# Patient Record
Sex: Male | Born: 1975 | Race: Black or African American | Hispanic: No | Marital: Single | Smoking: Former smoker
Health system: Southern US, Community
[De-identification: ages and names within clinical notes are randomized; demographics above are authoritative.]

## PROBLEM LIST (undated history)

## (undated) DIAGNOSIS — I639 Cerebral infarction, unspecified: Secondary | ICD-10-CM

## (undated) DIAGNOSIS — I10 Essential (primary) hypertension: Secondary | ICD-10-CM

## (undated) DIAGNOSIS — I517 Cardiomegaly: Secondary | ICD-10-CM

## (undated) DIAGNOSIS — H409 Unspecified glaucoma: Secondary | ICD-10-CM

## (undated) DIAGNOSIS — D649 Anemia, unspecified: Secondary | ICD-10-CM

## (undated) DIAGNOSIS — R161 Splenomegaly, not elsewhere classified: Secondary | ICD-10-CM

## (undated) DIAGNOSIS — D696 Thrombocytopenia, unspecified: Secondary | ICD-10-CM

## (undated) DIAGNOSIS — R079 Chest pain, unspecified: Secondary | ICD-10-CM

## (undated) HISTORY — PX: CHOLECYSTECTOMY: SHX55

---

## 2017-04-18 IMAGING — US US ABDOMEN COMPLETE
1 series · 14 of 25 positions shown · non-contrast
Comparison: CT on [DATE]

CLINICAL DATA: Right upper quadrant abdominal pain for 2 days.
Prior cholecystectomy.

EXAM:
ABDOMEN ULTRASOUND COMPLETE

[Series 1: us abdomen complete · 0.25mm/px · 14 of 78 slices shown]
[im 1/78]
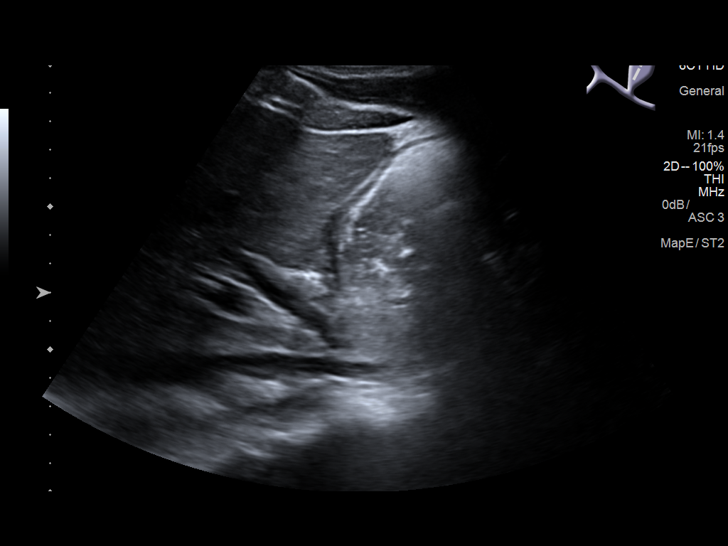
[im 7/78]
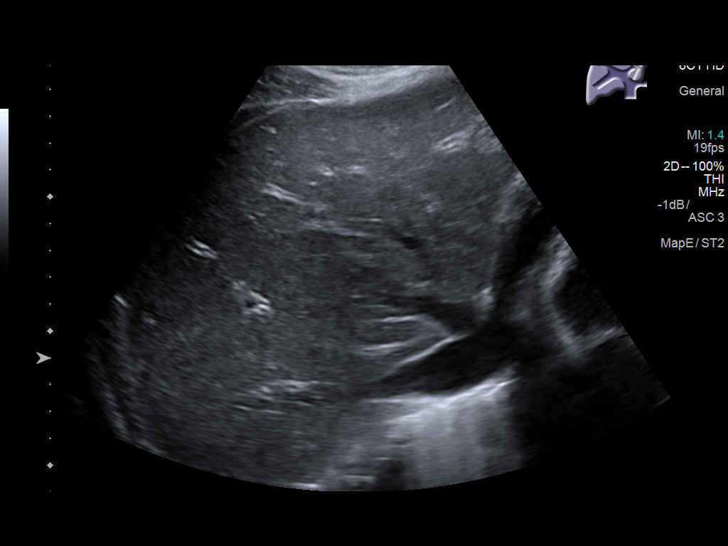
[im 13/78]
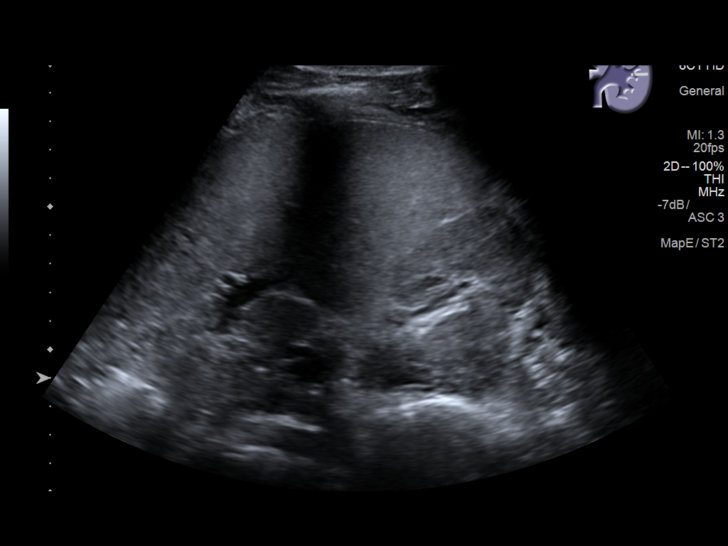
[im 20/78]
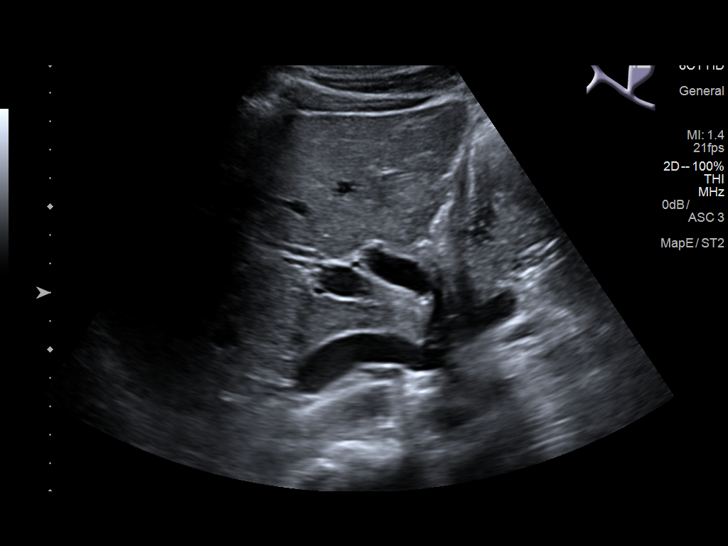
[im 26/78]
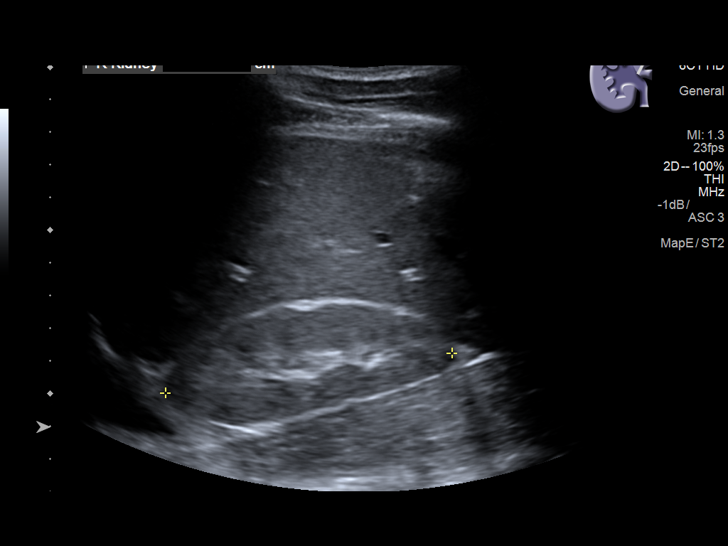
[im 29/78]
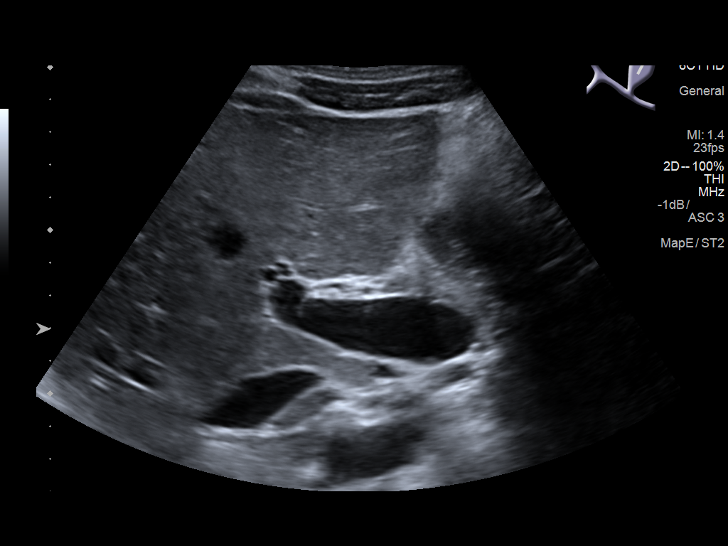
[im 36/78]
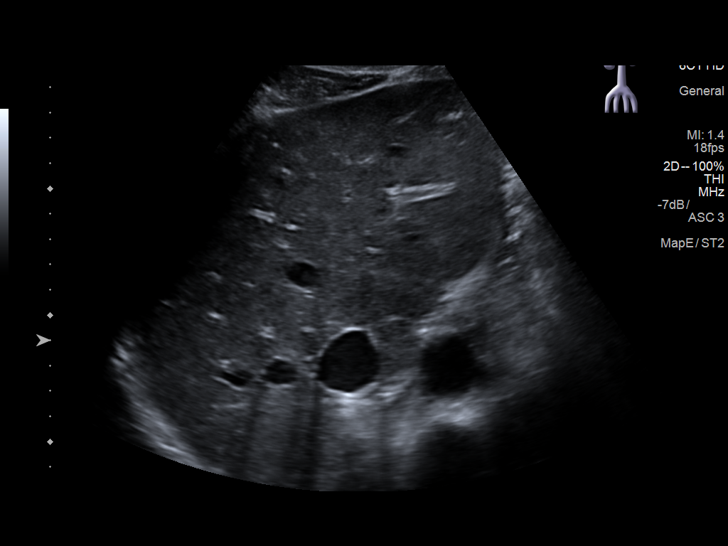
[im 42/78]
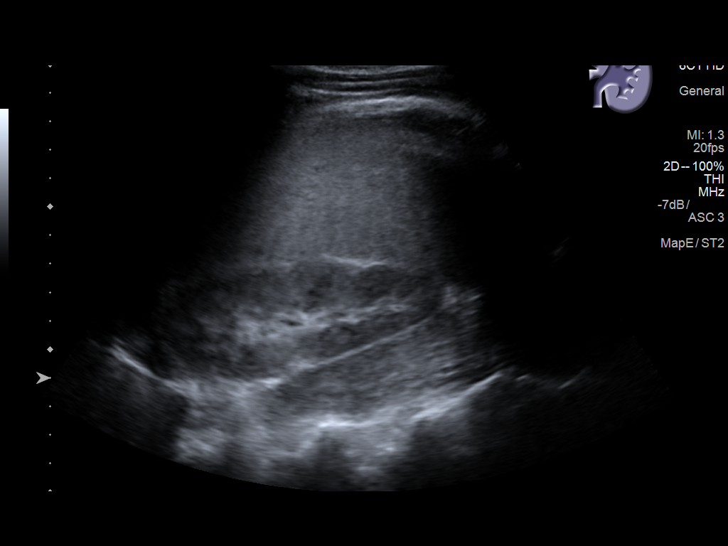
[im 49/78]
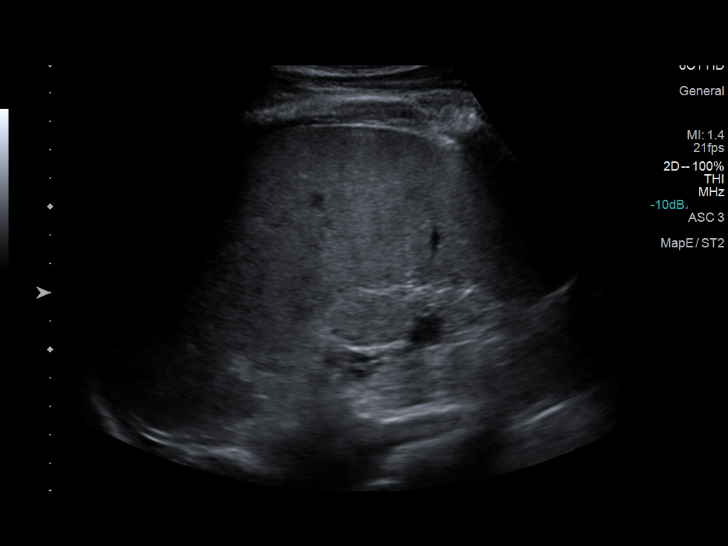
[im 52/78]
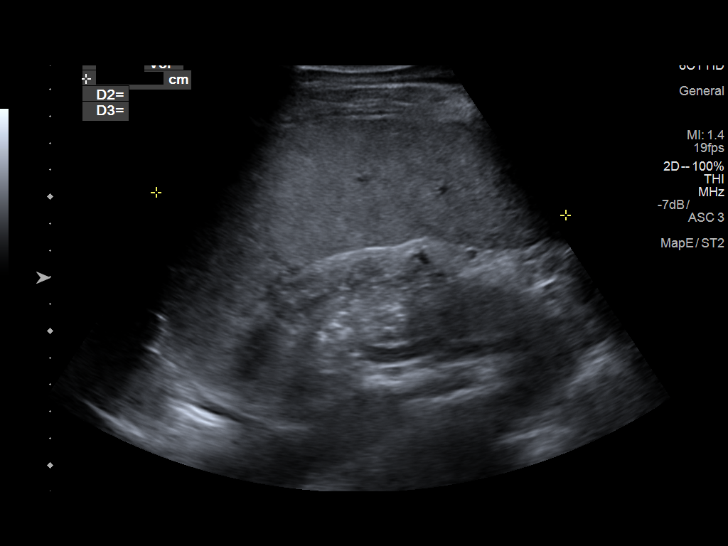
[im 58/78]
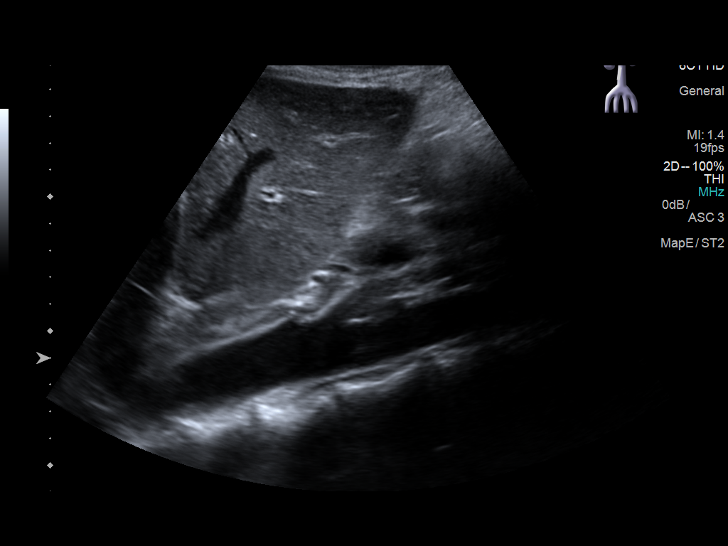
[im 65/78]
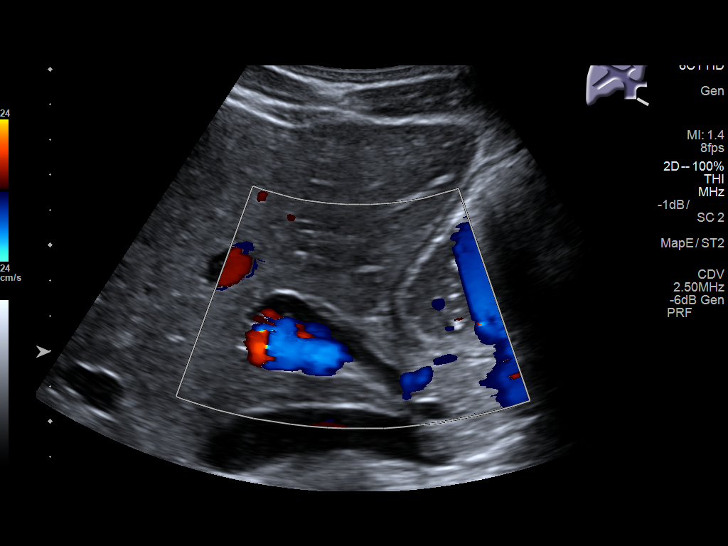
[im 71/78]
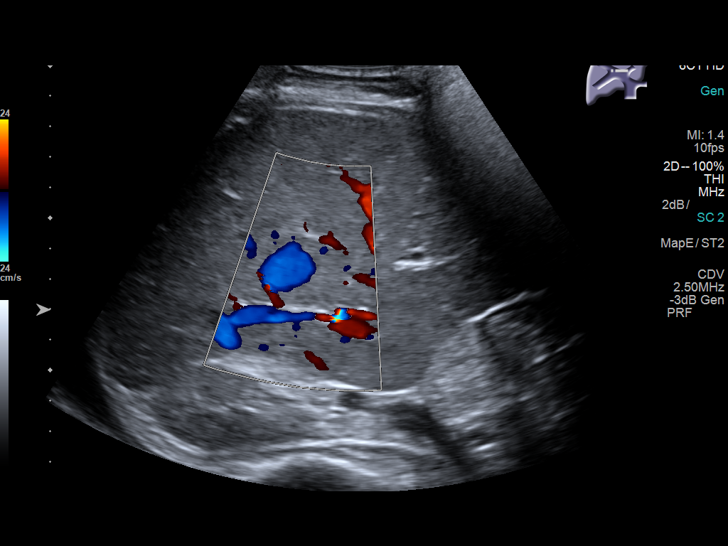
[im 78/78]
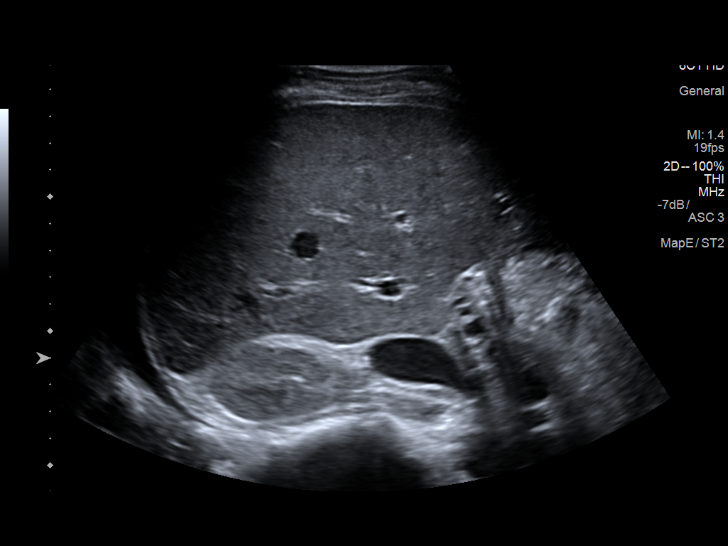

[14 of 25 positions shown; findings below may reference images not displayed]

FINDINGS: Gallbladder: Surgically absent.

Common bile duct: Diameter: 9 mm, within normal limits status post
cholecystectomy.

Liver: No focal lesion identified. Within normal limits in
parenchymal echogenicity. Portal vein is patent on color Doppler
imaging with normal direction of blood flow towards the liver.

IVC: No abnormality visualized.

Pancreas: Visualized portion unremarkable.

Spleen: Splenomegaly, with length measuring approximately 15 cm and
calculated volume of [SO] cc. No splenic masses identified.

Right Kidney: Length: 8.9 cm. Echogenicity within normal limits. No
mass or hydronephrosis visualized.

Left Kidney: Length: 10.2 cm. Echogenicity within normal limits. No
mass or hydronephrosis visualized.

Abdominal aorta: No aneurysm visualized.

Other findings: Small right pleural effusion.
IMPRESSION: Prior cholecystectomy.  No evidence of biliary ductal dilatation.

Moderate splenomegaly.

Small right pleural effusion.

## 2017-12-20 NOTE — Plan of Care (Signed)
[facility] for need of cardiology services Mr. Dayton ScrapeMurray is a 42 year old male with pmh HTN, who presents with complaints of chest pain and shortness of breath which she noted radiated to his back over the last few days.  No history of tobacco abuse or other known medical problems reported.  Vital signs patient was noted to be afebrile, pulse 85, respirations 22, blood pressure 177/84, and O2 saturations 90-100% on room air.  Labs revealed WBC 9.9, hemoglobin 9.3, platelets 30, sodium 136, potassium 3.7, BUN 13, creatinine 1.4, troponin 0 0.02, pro-BNP 4020, PT 10.4, and aPTT 29. Peripheral smear showed large platelets and reported schistocytes.  Stool guaiac was negative for any signs of blood.  CT angiogram was performed which showed no signs of a pulmonary embolus or dissection.  Given nitroglycerin which helped relieve pain symptoms and Lasix to be given.  Discussed with Elvia CollumBethany Martin with cardiology.  Accepted to a tele[facility] as [facility].

## 2017-12-21 ENCOUNTER — Inpatient Hospital Stay (HOSPITAL_COMMUNITY)
Admission: AD | Admit: 2017-12-21 | Discharge: 2017-12-25 | DRG: 808 | Disposition: A | Discharge status: Home / Self Care | Payer: Medicaid - Out of State | Source: Other Acute Inpatient Hospital | Attending: Family Medicine | Admitting: Family Medicine

## 2017-12-21 ENCOUNTER — Inpatient Hospital Stay (HOSPITAL_COMMUNITY): Payer: Medicaid - Out of State

## 2017-12-21 ENCOUNTER — Encounter (HOSPITAL_COMMUNITY): Payer: Self-pay | Admitting: Internal Medicine

## 2017-12-21 ENCOUNTER — Other Ambulatory Visit: Payer: Self-pay

## 2017-12-21 DIAGNOSIS — I5031 Acute diastolic (congestive) heart failure: Secondary | ICD-10-CM | POA: Diagnosis present

## 2017-12-21 DIAGNOSIS — I110 Hypertensive heart disease with heart failure: Secondary | ICD-10-CM | POA: Diagnosis present

## 2017-12-21 DIAGNOSIS — R1012 Left upper quadrant pain: Secondary | ICD-10-CM | POA: Diagnosis present

## 2017-12-21 DIAGNOSIS — M311 Thrombotic microangiopathy: Secondary | ICD-10-CM | POA: Diagnosis present

## 2017-12-21 DIAGNOSIS — E538 Deficiency of other specified B group vitamins: Secondary | ICD-10-CM

## 2017-12-21 DIAGNOSIS — I2699 Other pulmonary embolism without acute cor pulmonale: Secondary | ICD-10-CM | POA: Diagnosis present

## 2017-12-21 DIAGNOSIS — R161 Splenomegaly, not elsewhere classified: Secondary | ICD-10-CM | POA: Diagnosis present

## 2017-12-21 DIAGNOSIS — H409 Unspecified glaucoma: Secondary | ICD-10-CM | POA: Diagnosis present

## 2017-12-21 DIAGNOSIS — R718 Other abnormality of red blood cells: Secondary | ICD-10-CM | POA: Diagnosis not present

## 2017-12-21 DIAGNOSIS — D573 Sickle-cell trait: Secondary | ICD-10-CM | POA: Diagnosis present

## 2017-12-21 DIAGNOSIS — F1290 Cannabis use, unspecified, uncomplicated: Secondary | ICD-10-CM | POA: Diagnosis present

## 2017-12-21 DIAGNOSIS — M3119 Other thrombotic microangiopathy: Secondary | ICD-10-CM

## 2017-12-21 DIAGNOSIS — I2721 Secondary pulmonary arterial hypertension: Secondary | ICD-10-CM

## 2017-12-21 DIAGNOSIS — R0781 Pleurodynia: Secondary | ICD-10-CM

## 2017-12-21 DIAGNOSIS — R079 Chest pain, unspecified: Secondary | ICD-10-CM

## 2017-12-21 DIAGNOSIS — Z720 Tobacco use: Secondary | ICD-10-CM | POA: Diagnosis not present

## 2017-12-21 DIAGNOSIS — D599 Acquired hemolytic anemia, unspecified: Secondary | ICD-10-CM | POA: Diagnosis present

## 2017-12-21 DIAGNOSIS — I10 Essential (primary) hypertension: Secondary | ICD-10-CM | POA: Diagnosis not present

## 2017-12-21 DIAGNOSIS — J189 Pneumonia, unspecified organism: Secondary | ICD-10-CM

## 2017-12-21 DIAGNOSIS — Z9049 Acquired absence of other specified parts of digestive tract: Secondary | ICD-10-CM | POA: Diagnosis not present

## 2017-12-21 DIAGNOSIS — Z832 Family history of diseases of the blood and blood-forming organs and certain disorders involving the immune mechanism: Secondary | ICD-10-CM

## 2017-12-21 DIAGNOSIS — I272 Pulmonary hypertension, unspecified: Secondary | ICD-10-CM

## 2017-12-21 DIAGNOSIS — D649 Anemia, unspecified: Secondary | ICD-10-CM | POA: Diagnosis present

## 2017-12-21 DIAGNOSIS — I352 Nonrheumatic aortic (valve) stenosis with insufficiency: Secondary | ICD-10-CM

## 2017-12-21 DIAGNOSIS — I361 Nonrheumatic tricuspid (valve) insufficiency: Secondary | ICD-10-CM

## 2017-12-21 DIAGNOSIS — K068 Other specified disorders of gingiva and edentulous alveolar ridge: Secondary | ICD-10-CM | POA: Diagnosis present

## 2017-12-21 DIAGNOSIS — R0789 Other chest pain: Secondary | ICD-10-CM | POA: Diagnosis not present

## 2017-12-21 DIAGNOSIS — D696 Thrombocytopenia, unspecified: Secondary | ICD-10-CM | POA: Diagnosis not present

## 2017-12-21 DIAGNOSIS — R1011 Right upper quadrant pain: Secondary | ICD-10-CM

## 2017-12-21 HISTORY — DX: Unspecified glaucoma: H40.9

## 2017-12-21 HISTORY — DX: Splenomegaly, not elsewhere classified: R16.1

## 2017-12-21 HISTORY — DX: Essential (primary) hypertension: I10

## 2017-12-21 HISTORY — DX: Anemia, unspecified: D64.9

## 2017-12-21 HISTORY — DX: Thrombocytopenia, unspecified: D69.6

## 2017-12-21 HISTORY — DX: Chest pain, unspecified: R07.9

## 2017-12-21 HISTORY — DX: Cardiomegaly: I51.7

## 2017-12-21 LAB — LACTATE DEHYDROGENASE: LDH: 351 U/L — ABNORMAL HIGH (ref 98–192)

## 2017-12-21 LAB — ECHOCARDIOGRAM COMPLETE
E decel time: 201 msec
EERAT: 0.9
FS: 32 % (ref 28–44)
HEIGHTINCHES: 74 in
IVS/LV PW RATIO, ED: 0.94
LA ID, A-P, ES: 43 mm
LA diam end sys: 43 mm
LA diam index: 2.19 cm/m2
LA vol index: 53.6 mL/m2
LA vol: 105 mL
LAVOLA4C: 73.3 mL
LDCA: 3.46 cm2
LV E/e'average: 0.9
LV PW d: 13.6 mm — AB (ref 0.6–1.1)
LV TDI E'LATERAL: 8.7
LV TDI E'MEDIAL: 7.51
LVEEMED: 0.9
LVELAT: 8.7 cm/s
LVOT VTI: 22.5 cm
LVOT diameter: 21 mm
LVOT peak grad rest: 5 mmHg
LVOT peak vel: 114 cm/s
LVOTSV: 78 mL
MV Dec: 201
MV pk A vel: 98.8 m/s
MV pk E vel: 7.83 m/s
RV LATERAL S' VELOCITY: 14.4 cm/s
RV TAPSE: 21.3 mm
RV sys press: 63 mmHg
Reg peak vel: 388 cm/s
TR max vel: 388 cm/s
WEIGHTICAEL: 2511.48 [oz_av]

## 2017-12-21 LAB — URINALYSIS, ROUTINE W REFLEX MICROSCOPIC
Glucose, UA: NEGATIVE mg/dL
Ketones, ur: NEGATIVE mg/dL
LEUKOCYTES UA: NEGATIVE
Nitrite: NEGATIVE
PROTEIN: NEGATIVE mg/dL
Specific Gravity, Urine: 1.01 (ref 1.005–1.030)
pH: 7 (ref 5.0–8.0)

## 2017-12-21 LAB — URINALYSIS, MICROSCOPIC (REFLEX)
BACTERIA UA: NONE SEEN
WBC UA: NONE SEEN WBC/hpf (ref 0–5)

## 2017-12-21 LAB — COMPREHENSIVE METABOLIC PANEL
ALK PHOS: 84 U/L (ref 38–126)
ALT: 47 U/L (ref 17–63)
AST: 59 U/L — ABNORMAL HIGH (ref 15–41)
Albumin: 3.8 g/dL (ref 3.5–5.0)
Anion gap: 10 (ref 5–15)
BUN: 13 mg/dL (ref 6–20)
CALCIUM: 8.9 mg/dL (ref 8.9–10.3)
CO2: 25 mmol/L (ref 22–32)
Chloride: 100 mmol/L — ABNORMAL LOW (ref 101–111)
Creatinine, Ser: 1.55 mg/dL — ABNORMAL HIGH (ref 0.61–1.24)
GFR calc Af Amer: >60 mL/min (ref >60)
GFR calc non Af Amer: 53 mL/min — ABNORMAL LOW (ref >60)
GLUCOSE: 95 mg/dL (ref 65–99)
Potassium: 3.8 mmol/L (ref 3.5–5.1)
SODIUM: 135 mmol/L (ref 135–145)
Total Bilirubin: 6 mg/dL — ABNORMAL HIGH (ref 0.3–1.2)
Total Protein: 5.9 g/dL — ABNORMAL LOW (ref 6.5–8.1)

## 2017-12-21 LAB — IRON AND TIBC
IRON: 72 ug/dL (ref 45–182)
SATURATION RATIOS: 34 % (ref 17.9–39.5)
TIBC: 213 ug/dL — AB (ref 250–450)
UIBC: 141 ug/dL

## 2017-12-21 LAB — HEPARIN LEVEL (UNFRACTIONATED)
HEPARIN UNFRACTIONATED: 0.16 [IU]/mL — AB (ref 0.30–0.70)
Heparin Unfractionated: 0.2 IU/mL — ABNORMAL LOW (ref 0.30–0.70)

## 2017-12-21 LAB — CBC
HCT: 22.4 % — ABNORMAL LOW (ref 39.0–52.0)
Hemoglobin: 7.9 g/dL — ABNORMAL LOW (ref 13.0–17.0)
MCH: 30 pg (ref 26.0–34.0)
MCHC: 35.3 g/dL (ref 30.0–36.0)
MCV: 85.2 fL (ref 78.0–100.0)
PLATELETS: 50 10*3/uL — AB (ref 150–400)
RBC: 2.63 MIL/uL — AB (ref 4.22–5.81)
RDW: 24.2 % — ABNORMAL HIGH (ref 11.5–15.5)
WBC: 7.9 10*3/uL (ref 4.0–10.5)

## 2017-12-21 LAB — RETICULOCYTES
RBC.: 0.57 MIL/uL — ABNORMAL LOW (ref 4.22–5.81)
Retic Ct Pct: >23 % — ABNORMAL HIGH (ref 0.4–3.1)

## 2017-12-21 LAB — C-REACTIVE PROTEIN: CRP: 1.8 mg/dL — ABNORMAL HIGH (ref <1.0)

## 2017-12-21 LAB — BILIRUBIN, DIRECT: BILIRUBIN DIRECT: 0.8 mg/dL — AB (ref 0.1–0.5)

## 2017-12-21 LAB — HIV ANTIBODY (ROUTINE TESTING W REFLEX): HIV SCREEN 4TH GENERATION: NONREACTIVE

## 2017-12-21 LAB — VITAMIN B12: Vitamin B-12: 184 pg/mL (ref 180–914)

## 2017-12-21 LAB — RAPID URINE DRUG SCREEN, HOSP PERFORMED
AMPHETAMINES: NOT DETECTED
Barbiturates: NOT DETECTED
Benzodiazepines: NOT DETECTED
Cocaine: NOT DETECTED
OPIATES: POSITIVE — AB
Tetrahydrocannabinol: POSITIVE — AB

## 2017-12-21 LAB — SAVE SMEAR

## 2017-12-21 LAB — FERRITIN: FERRITIN: 209 ng/mL (ref 24–336)

## 2017-12-21 LAB — DIRECT ANTIGLOBULIN TEST (NOT AT ARMC)
DAT, IgG: NEGATIVE
DAT, complement: NEGATIVE

## 2017-12-21 LAB — SEDIMENTATION RATE: Sed Rate: 10 mm/hr (ref 0–16)

## 2017-12-21 LAB — BRAIN NATRIURETIC PEPTIDE: B Natriuretic Peptide: 819.1 pg/mL — ABNORMAL HIGH (ref 0.0–100.0)

## 2017-12-21 MED ORDER — ACETAMINOPHEN 325 MG PO TABS
650.0000 mg | ORAL_TABLET | ORAL | Status: DC | PRN
  Administered 2017-12-21: 650 mg via ORAL
  Filled 2017-12-21 (×2): qty 2

## 2017-12-21 MED ORDER — HYDROMORPHONE HCL 1 MG/ML IJ SOLN
1.0000 mg | Freq: Once | INTRAMUSCULAR | Status: AC
  Administered 2017-12-21: 1 mg via INTRAVENOUS
  Filled 2017-12-21: qty 1

## 2017-12-21 MED ORDER — SODIUM CHLORIDE 0.9 % IV SOLN
INTRAVENOUS | Status: AC
  Administered 2017-12-21 – 2017-12-22 (×3): via INTRAVENOUS

## 2017-12-21 MED ORDER — METOPROLOL TARTRATE 12.5 MG HALF TABLET
12.5000 mg | ORAL_TABLET | Freq: Two times a day (BID) | ORAL | Status: DC
  Administered 2017-12-21 – 2017-12-22 (×3): 12.5 mg via ORAL
  Filled 2017-12-21 (×3): qty 1

## 2017-12-21 MED ORDER — ONDANSETRON HCL 4 MG/2ML IJ SOLN
4.0000 mg | Freq: Four times a day (QID) | INTRAMUSCULAR | Status: DC | PRN
  Administered 2017-12-21 – 2017-12-25 (×10): 4 mg via INTRAVENOUS
  Filled 2017-12-21 (×10): qty 2

## 2017-12-21 MED ORDER — MORPHINE SULFATE (PF) 2 MG/ML IV SOLN: 2.0000 mg | INTRAVENOUS | Status: DC | PRN

## 2017-12-21 MED ORDER — HYDROMORPHONE HCL 1 MG/ML IJ SOLN
1.0000 mg | INTRAMUSCULAR | Status: DC | PRN
Start: 2017-12-21 — End: 2017-12-21
  Filled 2017-12-21: qty 1

## 2017-12-21 MED ORDER — HEPARIN (PORCINE) IN NACL 100-0.45 UNIT/ML-% IJ SOLN
1800.0000 [IU]/h | INTRAMUSCULAR | Status: DC
Start: 2017-12-21 — End: 2017-12-22
  Administered 2017-12-21: 1400 [IU]/h via INTRAVENOUS
  Filled 2017-12-21: qty 250

## 2017-12-21 MED ORDER — HYDROCODONE-ACETAMINOPHEN 5-325 MG PO TABS
1.0000 | ORAL_TABLET | ORAL | Status: DC | PRN
  Administered 2017-12-21 – 2017-12-22 (×3): 1 via ORAL
  Filled 2017-12-21 (×4): qty 1

## 2017-12-21 MED ORDER — ENOXAPARIN SODIUM 40 MG/0.4ML ~~LOC~~ SOLN: 40.0000 mg | Freq: Every day | SUBCUTANEOUS | Status: DC

## 2017-12-21 MED ORDER — GI COCKTAIL ~~LOC~~
30.0000 mL | Freq: Four times a day (QID) | ORAL | Status: DC | PRN
  Administered 2017-12-21 – 2017-12-24 (×3): 30 mL via ORAL
  Filled 2017-12-21 (×5): qty 30

## 2017-12-21 NOTE — Progress Notes (Signed)
[facility] just arrived [facility] with complain of chest pain and coughing. Troponin 0.2. Patient alert and oriented. Denies pain. Telemetry applied and CCMD called for verification. Provider notified for admission orders.

## 2017-12-21 NOTE — H&P (Signed)
History and Physical    Alejandro Perez KDX:[id number] DOB: January 20, 1976 DOA: 12/21/2017  PCP: No primary care provider on file. primary in [place] Patient coming from: [facility]  Chief Complaint: headache/sob/chest pain  HPI: Alejandro Perez is a very pleasant 42 y.o. male with medical history significant HTN, glaucoma presents to room 13 on 4 E. at [facility] from [place] with the chief complaint sudden persistent headache/shortness of breath chest pain. Initial evaluation concerning for unstable angina and reveals thrombocytopenia schistocytes and enlarged spleen. [facility] were asked to admit.  Information is obtained from the patient. He states 2 days ago he was "just sitting in a chair" he developed sudden onset chest pain described it as sharp constant located left to right anteriorly radiated to his left arm. Associated symptoms include diaphoresis nausea but no vomiting shortness of breath and headache. He also reports some intermittent lower extremity edema. He states nothing made the chest pain better or worse but the shortness of breath worsened with movement and lying flat. He states if he sat still the breathing improved. He denies cough fever chills diarrhea dysuria hematuria frequency or urgency. He denies any recent travel or sick contacts. He denies tobacco use but smoke "pot". Denies other drug use.    ED Course: At [facility] vital signs were stable he was afebrile. Hemoglobin 9.3 platelets 30 schistocyte on peripheral smear, creatinine 1.4 initial troponin 0.02 proBNP 4020. CT angiogram was performed which showed no signs of pulmonary embolus or dissection.  Heparin gtt initiated and diluadid and ntg SL given which helped relieve the pain) drip was initiated.  Review of Systems: As per HPI otherwise all other systems reviewed and are negative.   Ambulatory Status: Ambulates independently is independent with ADLs. However is on disability due to  glaucoma  Past Medical History:  Diagnosis Date  . Anemia   . Chest pain   . Glaucoma   . Hypertension   . LVH (left ventricular hypertrophy)   . Spleen enlarged   . Thrombocytopenia ([place])       Social History   Socioeconomic History  . Marital status: Single    Spouse name: Not on file  . Number of children: Not on file  . Years of education: Not on file  . Highest education level: Not on file  Social Needs  . Financial resource strain: Not on file  . Food insecurity - worry: Not on file  . Food insecurity - inability: Not on file  . Transportation needs - medical: Not on file  . Transportation needs - non-medical: Not on file  Occupational History  . Not on file  Tobacco Use  . Smoking status: Not on file  Substance and Sexual Activity  . Alcohol use: Not on file  . Drug use: Not on file  . Sexual activity: Not on file  Other Topics Concern  . Not on file  Social History Narrative  . Not on file    Allergies not on file  Family History  Problem Relation Age of Onset  . Sickle cell trait Mother     Prior to Admission medications   Not on File    Physical Exam: Vitals:   12/21/17 0500 12/21/17 0618  BP:  (!) 155/96  Pulse:  71  Temp:  99.2 F (37.3 C)  TempSrc:  Oral  SpO2:  100%  Weight: 71.2 kg (156 lb 15.5 oz)   Height: _0  (1.88 m)      General:  Appears calm and comfortable in no acute distress Eyes:  PERRL, EOMI, normal lids, iris ENT:  grossly normal hearing, lips & tongue, mucous membranes of his mouth are pink but dry Neck:  no LAD, masses or thyromegaly Cardiovascular:  RRR, no m/r/g. No LE edema. Pedal pulses present and palpable Respiratory:  CTA bilaterally, no w/r/r. Normal respiratory effort. Abdomen:  soft, ntnd, positive bowel sounds no guarding or rebounding Skin:  Cool clammy no rashes or lesions Musculoskeletal:  grossly normal tone BUE/BLE, good ROM, no bony abnormality Psychiatric:  grossly normal mood and affect,  speech fluent and appropriate, AOx3 Neurologic:  CN 2-12 grossly intact, moves all extremities in coordinated fashion, sensation intact. Speech clear facial symmetry  Labs on Admission: I have personally reviewed following labs and imaging studies  CBC: No results for input(s): WBC, NEUTROABS, HGB, HCT, MCV, PLT in the last 168 hours. Basic Metabolic Panel: No results for input(s): NA, K, CL, CO2, GLUCOSE, BUN, CREATININE, CALCIUM, MG, PHOS in the last 168 hours. GFR: CrCl cannot be calculated (No order found.). Liver Function Tests: No results for input(s): AST, ALT, ALKPHOS, BILITOT, PROT, ALBUMIN in the last 168 hours. No results for input(s): LIPASE, AMYLASE in the last 168 hours. No results for input(s): AMMONIA in the last 168 hours. Coagulation Profile: No results for input(s): INR, PROTIME in the last 168 hours. Cardiac Enzymes: No results for input(s): CKTOTAL, CKMB, CKMBINDEX, TROPONINI in the last 168 hours. BNP (last 3 results) No results for input(s): PROBNP in the last 8760 hours. HbA1C: No results for input(s): HGBA1C in the last 72 hours. CBG: No results for input(s): GLUCAP in the last 168 hours. Lipid Profile: No results for input(s): CHOL, HDL, LDLCALC, TRIG, CHOLHDL, LDLDIRECT in the last 72 hours. Thyroid Function Tests: No results for input(s): TSH, T4TOTAL, FREET4, T3FREE, THYROIDAB in the last 72 hours. Anemia Panel: No results for input(s): VITAMINB12, FOLATE, FERRITIN, TIBC, IRON, RETICCTPCT in the last 72 hours. Urine analysis: No results found for: COLORURINE, APPEARANCEUR, LABSPEC, PHURINE, GLUCOSEU, HGBUR, BILIRUBINUR, KETONESUR, PROTEINUR, UROBILINOGEN, NITRITE, LEUKOCYTESUR  Creatinine Clearance: CrCl cannot be calculated (No order found.).  Sepsis Labs: _0 (procalcitonin:4,lacticidven:4) )No results found for this or any previous visit (from the past 240 hour(s)).   Radiological Exams on Admission: No results found.  EKG: at [place]  NSR with LVH and inverted T wave. Repeat pending at time of admission  Assessment/Plan Principal Problem:   Chest pain Active Problems:   Thrombocytopenia (HCC)   Hypertension   Spleen enlarged   Anemia   Schistocytes on peripheral blood smear   #1. Chest pain. Etiology unclear but concern for unstable angina in setting of possible TTP. Patient was given nitroglycerin and a heparin drip [facility] at [facility]. [facility] at [facility] normal sinus rhythm with LVH and inverted T waves. Initial troponin 0.02. BNP elevated greater than 4000. CT angiogram of chest no PE or dissection -Admit to step down -Cycle troponin -Serial EKG -We'll obtain a 2-D echo -Continue heparin drip -GI cocktail -Continue home metoprolol -Oxygen supplementation as indicated -Cardiology consult  #2. Thrombocytopenia/enlarged spleen/anemia/schistocytes on peripheral smear. Concern for TTP. Hg. 9.3, plt 30 CT abdomen pelvis reveals enlarged spleen -obtain complete metabolic panel, LDH, haptoglobin, ESR, CRP, ANA, -Continue heparin -Urinalysis -Hematology consult  #3. Hypertension. History of same. Medications include metoprolol. On admission his blood pressures the high end of normal -Continue metoprolol with parameters -Monitor    DVT prophylaxis: heparin gtt  Code Status: full  Family  Communication: none present  Disposition Plan: home when ready  Consults called:  [facility] oncology card master cardiology Admission status: full    Radene Gunning MD [facility]  If 7PM-7AM, please contact night-coverage [email or web address] Password [email or web address]  12/21/2017, 8:08 AM

## 2017-12-21 NOTE — Progress Notes (Signed)
ANTICOAGULATION CONSULT NOTE - Follow Up Consult  Pharmacy Consult for Heparin Indication: chest pain/ACS  Allergies  Allergen Reactions  . Morphine And Related Other (See Comments)    Severe abdominal pain    Patient Measurements: Height: 6\' 2"  (188 cm) Weight: 156 lb 15.5 oz (71.2 kg) IBW/kg (Calculated) : 82.2 Heparin Dosing Weight: 71 kg  Vital Signs: Temp: 99 F (37.2 C) (02/01 2005) Temp Source: Oral (02/01 2005) BP: 140/84 (02/01 2005) Pulse Rate: 73 (02/01 2005)  Labs: Recent Labs    12/21/17 0750 12/21/17 1040 12/21/17 1753  HGB 7.9*  --   --   HCT 22.4*  --   --   PLT 50*  --   --   HEPARINUNFRC  --  0.20* 0.16*  CREATININE 1.55*  --   --     Estimated Creatinine Clearance: 61.9 mL/min (A) (by C-G formula based on SCr of 1.55 mg/dL (H)).  Assessment:  42 year [facility]. [facility] and it continues to infuse here. Patient anemic with hgb of 9.3 on admission and profoundly thrombocytopenic with a pltc of 30 for unknown reason. Prior pharmacist d/w primary team and will continue heparin for now.  Hematology consulted, no contraindication for anticoagulation or antiplatelet if needed. Highly supsicious for autoimmune hemolytic anemia.   Cardiology noted chest pain not consistent with angina, sounds pleuritic, and most likely etiology is small pulmonary infarction.    Heparin level tonight is subtherapeutic (0.16) on 1400 units/hr.  No change after rate increase from 1200 units/hr.  No bleeding reported.  Goal of Therapy:  Heparin level 0.3-0.7 units/ml, but will aim for 0.3-0.5 Monitor platelets by anticoagulation protocol: Yes   Plan:   Increase heparin drip to 1600 units/hr  Heparin level ~6 hrs after increase.  Daily heparin level and CBC while on heparin.  Monitor for any bleeding.  Follow up studies and anticoagulation plans.  Dennie Fettersgan, Kea Callan Donovan, [place] Pager:  [phone] 12/21/2017,8:42 PM

## 2017-12-21 NOTE — Progress Notes (Signed)
  Echocardiogram 2D Echocardiogram has been performed.  Celene SkeenVijay  Trasean Delima 12/21/2017, 10:06 AM

## 2017-12-21 NOTE — Progress Notes (Addendum)
ANTICOAGULATION CONSULT NOTE - Initial Consult  Pharmacy Consult for heparin Indication: chest pain/ACS  Allergies not on file  Patient Measurements: Height: 6\' 2"  (188 cm) Weight: 156 lb 15.5 oz (71.2 kg) IBW/kg (Calculated) : 82.2 Heparin Dosing Weight: 70kg  Vital Signs: Temp: 99.5 F (37.5 C) (02/01 0800) Temp Source: Oral (02/01 0800) BP: 160/98 (02/01 0800) Pulse Rate: 70 (02/01 0800)  Labs: No results for input(s): HGB, HCT, PLT, APTT, LABPROT, INR, HEPARINUNFRC, HEPRLOWMOCWT, CREATININE, CKTOTAL, CKMB, TROPONINI in the last 72 hours.  CrCl cannot be calculated (No order found.).   Medical History: Past Medical History:  Diagnosis Date  . Anemia   . Chest pain   . Glaucoma   . Hypertension   . LVH (left ventricular hypertrophy)   . Spleen enlarged   . Thrombocytopenia [facility](HCC)    Assessment: 42 [facility]. [facility] and it continues to infuse here. Patient anemic with hgb of 9.3 on admission and profoundly thrombocytopenic with a pltc of 30 for unknown reason. Hematology being consulted, d/w primary team and will continue heparin for now.  Heparin started around 4am Goal of Therapy:  Heparin level 0.3-0.7 units/ml Monitor platelets by anticoagulation protocol: Yes   Plan:  Continue heparin infusion at 1200 units/hr Check anti-Xa level in 6 hours and daily while on heparin Continue to monitor H&H and platelets  Sheppard CoilFrank Laurieann Friddle PharmD., BCPS Clinical Pharmacist 12/21/2017 8:58 AM   Initial heparin level below goal. Will not bolus given thrombocytopenia but will increase rate to 1400 units/hr. Recheck level tonight.  12/21/2017 12:06 PM

## 2017-12-21 NOTE — Consult Note (Signed)
Cardiology Consultation:   Patient ID: Alejandro Perez; [id number]; 05/26/1976   Admit date: 12/21/2017 Date of Consult: 12/21/2017  Primary Care Provider: No primary care provider on file. Primary Cardiologist: New (Dr. Sallyanne Kuster)    Patient Profile:   Alejandro Perez is a 42 y.o. AA male with a hx of HTN, glaucoma since the age of 60, intermittent tobacco and mariajuana use and family h/o sickle cell anemia trait (mother), transferred from [facility] for evaluation for chest pain + hematologic w/u (concerns for TTP)  who is being seen today for the evaluation of chest pain, at the request of Dr. Jamse Arn, Internal Medicine.  History of Present Illness:   Alejandro Perez has no prior cardiac history. Cardiac risk factors include HTN and tobacco use. He denies any family h/o CAD or SCD. No personal history of DM or HLD that he is aware of. He has been on metoprolol for HTN for "years". Reports being diagnosed with glaucoma at an early age, 34. Prior to recent illness, he was very active, playing basketball regularly and jogging w/o exertional symptoms or limitations.  Pt presented to [facility] yesterday with multiple complaints. Initial complaint was chest pain,  Pt also complained of new frequent bruising and gum bleeding. Noted to have thrombocytopenia/ anemia with markedly enlarged spleen on abdominal CT (16.3 cm) and schistocytes on peripheral smear. Hgb 9.3 and plt 30.  There is concern for TTP. He has had further drop in Hgb today, now at 7.9. Hematology consult pending.   Cardiology asked to evaluate for chest pain. His pain is described atypical. Described as sharp left sided chest pain that radiates to the right. Stabbing like pain that is pleuritic. Hurts to take in a deep breath. However not any better or worse with positional changes. CP started ~2 days ago. Before development of chest pain, he had mild exertional dyspnea that started ~5 days ago. No syncope/ near syncope.    Records reviewed from [place]. Troponins negative x 2 at 0.02>>0.02. EKG showed NSR with LVH and LAE. Chest CT negative for PE, dissection and pneumothorax.  PA artery dilated at 3.2 cm, which can be seen with pulmonary HTN. BNP was abnormal at 4,220. IV Lasix was given. However pt reports he noted no significant improvement in breathing afterwards. SCr 1.4.     Past Medical History:  Diagnosis Date  . Anemia   . Chest pain   . Glaucoma   . Hypertension   . LVH (left ventricular hypertrophy)   . Spleen enlarged   . Thrombocytopenia ([place])     Home Medications:  Prior to Admission medications   Not on File    Inpatient Medications: Scheduled Meds: . metoprolol tartrate  12.5 mg Oral BID   Continuous Infusions: . heparin     PRN Meds: acetaminophen, gi cocktail, ondansetron (ZOFRAN) IV  Allergies:   Allergies not on file  Social History:   Social History   Socioeconomic History  . Marital status: Single    Spouse name: Not on file  . Number of children: Not on file  . Years of education: Not on file  . Highest education level: Not on file  Social Needs  . Financial resource strain: Not on file  . Food insecurity - worry: Not on file  . Food insecurity - inability: Not on file  . Transportation needs - medical: Not on file  . Transportation needs - non-medical: Not on file  Occupational History  . Not on file  Tobacco Use  .  Smoking status: Not on file  Substance and Sexual Activity  . Alcohol use: Not on file  . Drug use: Not on file  . Sexual activity: Not on file  Other Topics Concern  . Not on file  Social History Narrative  . Not on file    Family History:    Family History  Problem Relation Age of Onset  . Sickle cell trait Mother      ROS:  Please see the history of present illness.   All other ROS reviewed and negative.     Physical Exam/Data:   Vitals:   12/21/17 0500 12/21/17 0618 12/21/17 0800  BP:  (!) 155/96 (!) 160/98  Pulse:   71 70  Temp:  99.2 F (37.3 C) 99.5 F (37.5 C)  TempSrc:  Oral Oral  SpO2:  100% 99%  Weight: 156 lb 15.5 oz (71.2 kg)    Height: '6\' 2"'$  (1.88 m)     No intake or output data in the 24 hours ending 12/21/17 0938 Filed Weights   12/21/17 0500  Weight: 156 lb 15.5 oz (71.2 kg)   Body mass index is 20.15 kg/m.  General:  Tall, thin appearing male, in no acute distress HEENT: normal Lymph: no adenopathy Neck: no JVD Endocrine:  No thryomegaly Vascular: No carotid bruits; FA pulses 2+ bilaterally without bruits  Cardiac: RRR w/ gallop  Lungs:  Decreased BS with crackles at RLL Abd: soft, nontender, no hepatomegaly  Ext: no edema Musculoskeletal:  No deformities, BUE and BLE strength normal and equal Skin: warm and dry  Neuro:  CNs 2-12 intact, no focal abnormalities noted Psych:  Normal affect   EKG:  The EKG was personally reviewed and demonstrates:  NSR with LVH and LAE Telemetry:  Telemetry was personally reviewed and demonstrates:  NSR   Relevant CV Studies: 2D echo pending   Laboratory Data:  Chemistry Recent Labs  Lab 12/21/17 0750  NA 135  K 3.8  CL 100*  CO2 25  GLUCOSE 95  BUN 13  CREATININE 1.55*  CALCIUM 8.9  GFRNONAA 53*  GFRAA >60  ANIONGAP 10    Recent Labs  Lab 12/21/17 0750  PROT 5.9*  ALBUMIN 3.8  AST 59*  ALT 47  ALKPHOS 84  BILITOT 6.0*   Hematology Recent Labs  Lab 12/21/17 0750  WBC 7.9  RBC 2.63*  HGB 7.9*  HCT 22.4*  MCV 85.2  MCH 30.0  MCHC 35.3  RDW 24.2*  PLT PENDING   Cardiac EnzymesNo results for input(s): TROPONINI in the last 168 hours. No results for input(s): TROPIPOC in the last 168 hours.  BNPNo results for input(s): BNP, PROBNP in the last 168 hours.  DDimer No results for input(s): DDIMER in the last 168 hours.  Radiology/Studies:  No results found.  Assessment and Plan:   1. Chest Pain: Chest pain is not c/w ischemic CP/ACS. Troponins at outside hospital negative x 2 and chest CT negative for  PE/dissection and pneumothorax. EKG shows NSR with LVH. Gallop noted on exam and longstanding h/o HTN, treated with metoprolol as outpatient. Agree with 2D echo to assess degree of hypertrophy, assess LVF and wall motion as well as assess the pericardium/ rule-out effusion. In addition, pt is not a candidate for invasive cardiac w/u given the degree of his anemia and thrombocytopenia. No plans for ischemic w/u at this time. Further recs to follow once echo is reviewed by MD.    For questions or updates, please contact [place] Please  consult [email or web address] for contact info under Cardiology/STEMI.   Signed, Lyda Jester, PA-C  12/21/2017 9:38 AM   I have seen and examined the patient along with Lyda Jester, PA-C .  I have reviewed the chart, notes and new data.  I agree with PA/NP's note.  Key new complaints: chest pain is clearly pleuritic (worse with deep breathing, coughing, laughing). No complaints of chronic dyspnea. Key examination changes: RRR, gallop (S3? Summation), clear lungs, no rub, no JVD. Cannot hear a murmur Key new findings / data: ECG c/w LVH. echo reviewed - findings are all consistent with chronic hypertensive heart disease (LVH, LA enlargement, diastolic dysfunction with elevated mean left atrial pressure). Moderate PAH also suggests a chronic condition (PAP>50 cannot be mounted acutely by an untrained RV), although acute worsening of chronic PAH may be present. Has moderate AI, without visible structural abnormalities if the aortic valve, no vegetation. ESR low . Thrombocytopenia, schistocytes, normal coags all c/w TTP.  PLAN: 1. Chest pain: not consistent with angina. Sounds pleuritic. Even with normal chest CT angio, I think most likely etiology is small pulmonary infarction. Echo with normal wall motion, normal cardiac enzymes. No plan for coronary angio or nuclear stress test. Can DC NPO from our point of view. 2. HTNive heart disease:  Has echo evidence of  CHF (elevated LA pressures at rest), but clinically he denies CHF symptoms. Needs aggressive BP control. While acutely ill with suspected TTP I would avoid RAAS inhibitors and diuretics (due to increased risk with renal dysfunction) and use beta blockers and calcium channel blockers instead. OK to give loop diuretics if he develops clinical hypervolemia. Watch for heart failure exacerbation when giving FFP or performing plasma exchange. 3. PAH:  In large part chronic and related to diastolic dysfunction (?maybe acutely worsened by small pulmonary emboli, increased CO due to anemia). 4. AI: chronicity uncertain, but not likely acute. Low level of suspicion for endocarditis with ESR 10, normal WBC, afebrile. Will require long term monitoring with echo. 5. 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   Sanda Klein, MD, [place] [phone] 12/21/2017, 10:59 AM

## 2017-12-21 NOTE — Consult Note (Signed)
[facility] CONSULT NOTE  No care team member to display  CHIEF COMPLAINTS/PURPOSE OF CONSULTATION:  Anemia and thrombocytopenia, concern for TTP  HISTORY OF PRESENTING ILLNESS:  Alejandro Perez 42 y.o. male is seen because of thrombocytopenia  And anemia  We do not have his baseline blood work. Reportedly, he has not been feeling well for several weeks and started to have easy bruising and occasional gum bleeding. The patient also noted passage of dark urine with associated new onset of abdominal pain and left-sided chest pain for 2 days. He was initially brought to [facility] and reportedly had imaging study which rule out pulmonary embolism.  He was noted to have anemia and thrombocytopenia and was subsequently [facility] management. At the time of evaluation, he has significant pain in the left upper quadrant region.  He denies nausea or vomiting.  No recent changes in bowel habits. On admission, his white blood cell count was 7.9, hemoglobin 7.9, normal MCV and a platelet count of 50,000.  Blood work show high level of total bilirubin with primarily indirect hyperbilirubinemia.  Serum creatinine is mildly elevated at 1.55.  AST is mildly elevated.  He was also noted to have elevated LDH and BNP He denies spontaneous epistaxis, hematuria, melena or hematochezia  The patient denies history of liver disease, exposure to heparin, history of cardiac murmur/prior cardiovascular surgery or recent new medications He denies prior blood or platelet transfusions Interestingly, the patient had cholecystectomy at the age of 11.  Apparently, his mother also had problem with anemia and cholecystectomy. The patient denies prior history of sickle cell disease. He denies the use of injectable drugs.  He does admit to using marijuana in the outpatient.  He denies alcoholism or smoking.  MEDICAL HISTORY:  Past Medical History:  Diagnosis Date  .  Anemia   . Chest pain   . Glaucoma   . Hypertension   . LVH (left ventricular hypertrophy)   . Spleen enlarged   . Thrombocytopenia (HCC)     SURGICAL HISTORY: Past Surgical History:  Procedure Laterality Date  . CHOLECYSTECTOMY      SOCIAL HISTORY: Social History   Socioeconomic History  . Marital status: Single    Spouse name: Not on file  . Number of children: Not on file  . Years of education: Not on file  . Highest education level: Not on file  Social Needs  . Financial resource strain: Not on file  . Food insecurity - worry: Not on file  . Food insecurity - inability: Not on file  . Transportation needs - medical: Not on file  . Transportation needs - non-medical: Not on file  Occupational History  . Not on file  Tobacco Use  . Smoking status: Former Games developer  . Smokeless tobacco: Never Used  Substance and Sexual Activity  . Alcohol use: No    Frequency: Never  . Drug use: No  . Sexual activity: Not on file  Other Topics Concern  . Not on file  Social History Narrative  . Not on file    FAMILY HISTORY: Family History  Problem Relation Age of Onset  . Sickle cell trait Mother     ALLERGIES:  is allergic to morphine and related.  MEDICATIONS:  Current Facility-Administered Medications  Medication Dose Route Frequency Provider Last Rate Last Dose  . 0.9 %  sodium chloride infusion   Intravenous Continuous Black, Lesle Chris, NP      . acetaminophen (  TYLENOL) tablet 650 mg  650 mg Oral Q4H PRN Gwenyth Bender, NP      . gi cocktail (Maalox,Lidocaine,Donnatal)  30 mL Oral QID PRN Gwenyth Bender, NP      . heparin ADULT infusion 100 units/mL (25000 units/266mL sodium chloride 0.45%)  1,400 Units/hr Intravenous Continuous Earnie Larsson, RPH 14 mL/hr at 12/21/17 7829 1,400 Units/hr at 12/21/17 0942  . HYDROcodone-acetaminophen (NORCO/VICODIN) 5-325 MG per tablet 1 tablet  1 tablet Oral Q4H PRN Gwenyth Bender, NP      . metoprolol tartrate (LOPRESSOR) tablet 12.5  mg  12.5 mg Oral BID Toya Smothers M, NP   12.5 mg at 12/21/17 0950  . ondansetron (ZOFRAN) injection 4 mg  4 mg Intravenous Q6H PRN Black, Lesle Chris, NP        REVIEW OF SYSTEMS:   Constitutional: Denies fevers, chills or abnormal night sweats Eyes: Denies blurriness of vision, double vision or watery eyes Ears, nose, mouth, throat, and face: Denies mucositis or sore throat Respiratory: Denies cough, dyspnea or wheezes Cardiovascular: Denies palpitation, chest discomfort or lower extremity swelling Gastrointestinal:  Denies nausea, heartburn or change in bowel habits Skin: Denies abnormal skin rashes Lymphatics: Denies new lymphadenopathy Neurological:Denies numbness, tingling or new weaknesses Behavioral/Psych: Mood is stable, no new changes  All other systems were reviewed with the patient and are negative.  PHYSICAL EXAMINATION: ECOG PERFORMANCE STATUS: 1 - Symptomatic but completely ambulatory  Vitals:   12/21/17 1100 12/21/17 1400  BP: (!) 158/100   Pulse:  71  Resp:  14  Temp:    SpO2:  99%   Filed Weights   12/21/17 0500  Weight: 156 lb 15.5 oz (71.2 kg)    GENERAL:alert, in distress from pain and appears uncomfortable SKIN: skin color, texture, turgor are normal, no rashes or significant lesions EYES: normal, conjunctiva are jaundiced OROPHARYNX:no exudate, no erythema and lips, buccal mucosa, and tongue normal  NECK: supple, thyroid normal size, non-tender, without nodularity LYMPH:  no palpable lymphadenopathy in the cervical, axillary or inguinal LUNGS: clear to auscultation and percussion with normal breathing effort HEART: regular rate & rhythm and no murmurs and no lower extremity edema ABDOMEN:abdomen soft, significant tenderness especially in the left upper quadrant region with palpable splenomegaly.  Well-healed surgical scar musculoskeletal:no cyanosis of digits and no clubbing  PSYCH: alert & oriented x 3 with fluent speech NEURO: no focal motor/sensory  deficits  LABORATORY DATA:  I have reviewed the data as listed CBC    Component Value Date/Time   WBC 7.9 12/21/2017 0750   RBC 2.63 (L) 12/21/2017 0750   HGB 7.9 (L) 12/21/2017 0750   HCT 22.4 (L) 12/21/2017 0750   PLT 50 (L) 12/21/2017 0750   MCV 85.2 12/21/2017 0750   MCH 30.0 12/21/2017 0750   MCHC 35.3 12/21/2017 0750   RDW 24.2 (H) 12/21/2017 0750   I have reviewed his peripheral blood smear.  I have also reviewed the peripheral blood smear with a hemato-pathologist.  We are both in agreement that the patient appears to have significant polychromasia.  We do not appreciate significant schistocytes.  No evidence of platelet clumping. Interestingly, I did notice some microcytosis.  White blood cell is of normal morphology.  RADIOGRAPHIC STUDIES: I have personally reviewed the radiological images as listed and agreed with the findings in the report. US Abdomen Complete  Result Date: 12/21/2017 CLINICAL DATA:  Right upper quadrant abdominal pain for 2 days. Prior cholecystectomy. EXAM: ABDOMEN ULTRASOUND COMPLETE COMPARISON:  CT  on 12/20/2017 FINDINGS: Gallbladder: Surgically absent. Common bile duct: Diameter: 9 mm, within normal limits status post cholecystectomy. Liver: No focal lesion identified. Within normal limits in parenchymal echogenicity. Portal vein is patent on color Doppler imaging with normal direction of blood flow towards the liver. IVC: No abnormality visualized. Pancreas: Visualized portion unremarkable. Spleen: Splenomegaly, with length measuring approximately 15 cm and calculated volume of 1000 cc. No splenic masses identified. Right Kidney: Length: 8.9 cm. Echogenicity within normal limits. No mass or hydronephrosis visualized. Left Kidney: Length: 10.2 cm. Echogenicity within normal limits. No mass or hydronephrosis visualized. Abdominal aorta: No aneurysm visualized. Other findings: Small right pleural effusion. IMPRESSION: Prior cholecystectomy.  No evidence of  biliary ductal dilatation. Moderate splenomegaly. Small right pleural effusion. Electronically Signed   By: Myles RosenthalJohn  Stahl M.D.   On: 12/21/2017 15:12    ASSESSMENT & PLAN  Hemolytic anemia The patient has significant polychromasia The high indirect hyperbilirubinemia with elevated LDH and splenomegaly with prior history of cholecystectomy, highly suspicious for autoimmune hemolytic anemia. I will check Coombs test and iron studies along with serum vitamin B12. I have ordered ultrasound which confirms splenomegaly, consistent with extravascular hemolysis. The typical picture for sickle cell disease would be splenic infarct and small spleen, not splenomegaly.  In any case, I will check hemoglobin electrophoresis That is a list of hereditary hemoglobinopathy such as spherocytosis, G6PD and others that could be responsible for extravascular hemolysis.  I will order test to rule out PNH For now, unless he have ischemic symptoms, blood transfusion is not necessary. Once further test results are available, if confirmed autoimmune hemolysis, he may benefit from a trial of corticosteroid therapy.  Thrombocytopenia We do not have baseline platelet count Thrombocytopenia could be related to chronic splenomegaly For now, there is no contraindication for him to proceed with antiplatelet agent or anticoagulation agent if needed He does not need platelet transfusion unless his blood count is less than 20,000 His overall presentation is not consistent with TTP  Mild elevated creatinine It could be related to recent contrast exposure Recommend aggressive IV fluid resuscitation  Left upper quadrant pain Could be related to splenomegaly or other issues Pain medication as needed  Elevated BNP Will defer to cardiologist  Discharge planning Unknown  Goals of care Further workup in progress. I will be away this weekend.  Please call hematology consult if questions arise.  I will get Dr. Gweneth DimitriPerlov to round  on him tomorrow  All questions were answered. The patient knows to call the clinic with any problems, questions or concerns. No barriers to learning was detected.    Artis DelayNi Lameeka Schleifer, MD 12/21/2017 4:13 PM

## 2017-12-22 ENCOUNTER — Inpatient Hospital Stay (HOSPITAL_COMMUNITY): Payer: Medicaid - Out of State

## 2017-12-22 DIAGNOSIS — R079 Chest pain, unspecified: Secondary | ICD-10-CM

## 2017-12-22 LAB — CBC WITH DIFFERENTIAL/PLATELET
Basophils Absolute: 0.1 10*3/uL (ref 0.0–0.1)
Basophils Relative: 1 %
EOS PCT: 7 %
Eosinophils Absolute: 0.5 10*3/uL (ref 0.0–0.7)
HEMATOCRIT: 22.4 % — AB (ref 39.0–52.0)
HEMOGLOBIN: 8.1 g/dL — AB (ref 13.0–17.0)
LYMPHS ABS: 1.6 10*3/uL (ref 0.7–4.0)
LYMPHS PCT: 22 %
MCH: 31.3 pg (ref 26.0–34.0)
MCHC: 36.2 g/dL — ABNORMAL HIGH (ref 30.0–36.0)
MCV: 86.5 fL (ref 78.0–100.0)
Monocytes Absolute: 0.4 10*3/uL (ref 0.1–1.0)
Monocytes Relative: 6 %
Neutro Abs: 4.6 10*3/uL (ref 1.7–7.7)
Neutrophils Relative %: 64 %
Platelets: 55 10*3/uL — ABNORMAL LOW (ref 150–400)
RBC: 2.59 MIL/uL — AB (ref 4.22–5.81)
RDW: 24.7 % — AB (ref 11.5–15.5)
WBC: 7.2 10*3/uL (ref 4.0–10.5)

## 2017-12-22 LAB — BASIC METABOLIC PANEL
ANION GAP: 12 (ref 5–15)
BUN: 15 mg/dL (ref 6–20)
CHLORIDE: 102 mmol/L (ref 101–111)
CO2: 23 mmol/L (ref 22–32)
Calcium: 8.3 mg/dL — ABNORMAL LOW (ref 8.9–10.3)
Creatinine, Ser: 1.54 mg/dL — ABNORMAL HIGH (ref 0.61–1.24)
GFR calc Af Amer: >60 mL/min (ref >60)
GFR calc non Af Amer: 54 mL/min — ABNORMAL LOW (ref >60)
Glucose, Bld: 81 mg/dL (ref 65–99)
Potassium: 4.1 mmol/L (ref 3.5–5.1)
SODIUM: 137 mmol/L (ref 135–145)

## 2017-12-22 LAB — HEPATITIS PANEL, ACUTE
HCV Ab: <0.1 s/co ratio (ref 0.0–0.9)
Hep A IgM: NEGATIVE
Hep B C IgM: NEGATIVE
Hepatitis B Surface Ag: NEGATIVE

## 2017-12-22 LAB — LACTATE DEHYDROGENASE: LDH: 331 U/L — AB (ref 98–192)

## 2017-12-22 LAB — PROTIME-INR
INR: 1.15
PROTHROMBIN TIME: 14.6 s (ref 11.4–15.2)

## 2017-12-22 LAB — ERYTHROPOIETIN: Erythropoietin: 127.5 m[IU]/mL — ABNORMAL HIGH (ref 2.6–18.5)

## 2017-12-22 LAB — APTT: APTT: 94 s — AB (ref 24–36)

## 2017-12-22 LAB — HAPTOGLOBIN: Haptoglobin: <10 mg/dL — ABNORMAL LOW (ref 34–200)

## 2017-12-22 LAB — HEPARIN LEVEL (UNFRACTIONATED)
HEPARIN UNFRACTIONATED: 0.22 [IU]/mL — AB (ref 0.30–0.70)
Heparin Unfractionated: 0.1 IU/mL — ABNORMAL LOW (ref 0.30–0.70)

## 2017-12-22 LAB — FIBRINOGEN: FIBRINOGEN: 397 mg/dL (ref 210–475)

## 2017-12-22 IMAGING — CR DG CHEST 2V
2 series · 2 of 2 positions shown · non-contrast
Comparison: CT [DATE]

CLINICAL DATA: Chest pain with shob x 1 day

EXAM:
CHEST - 2 VIEW

[chest ap]
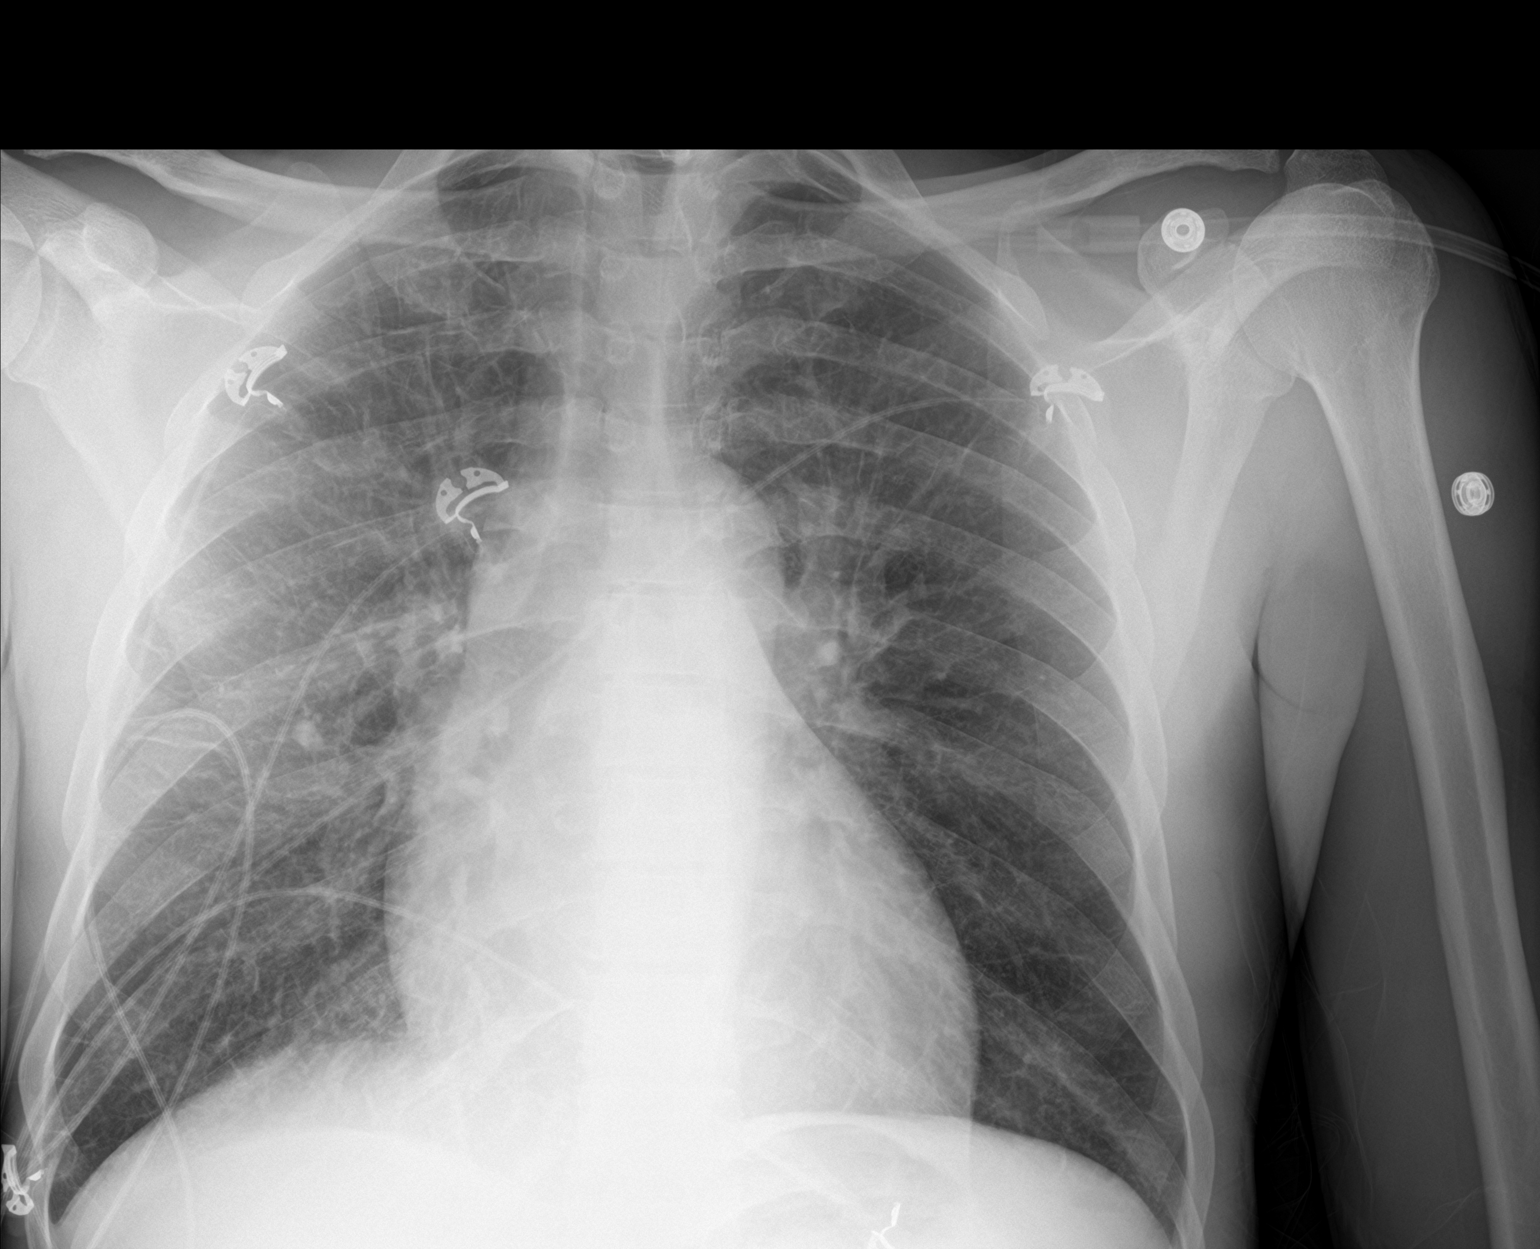

[chest lat]
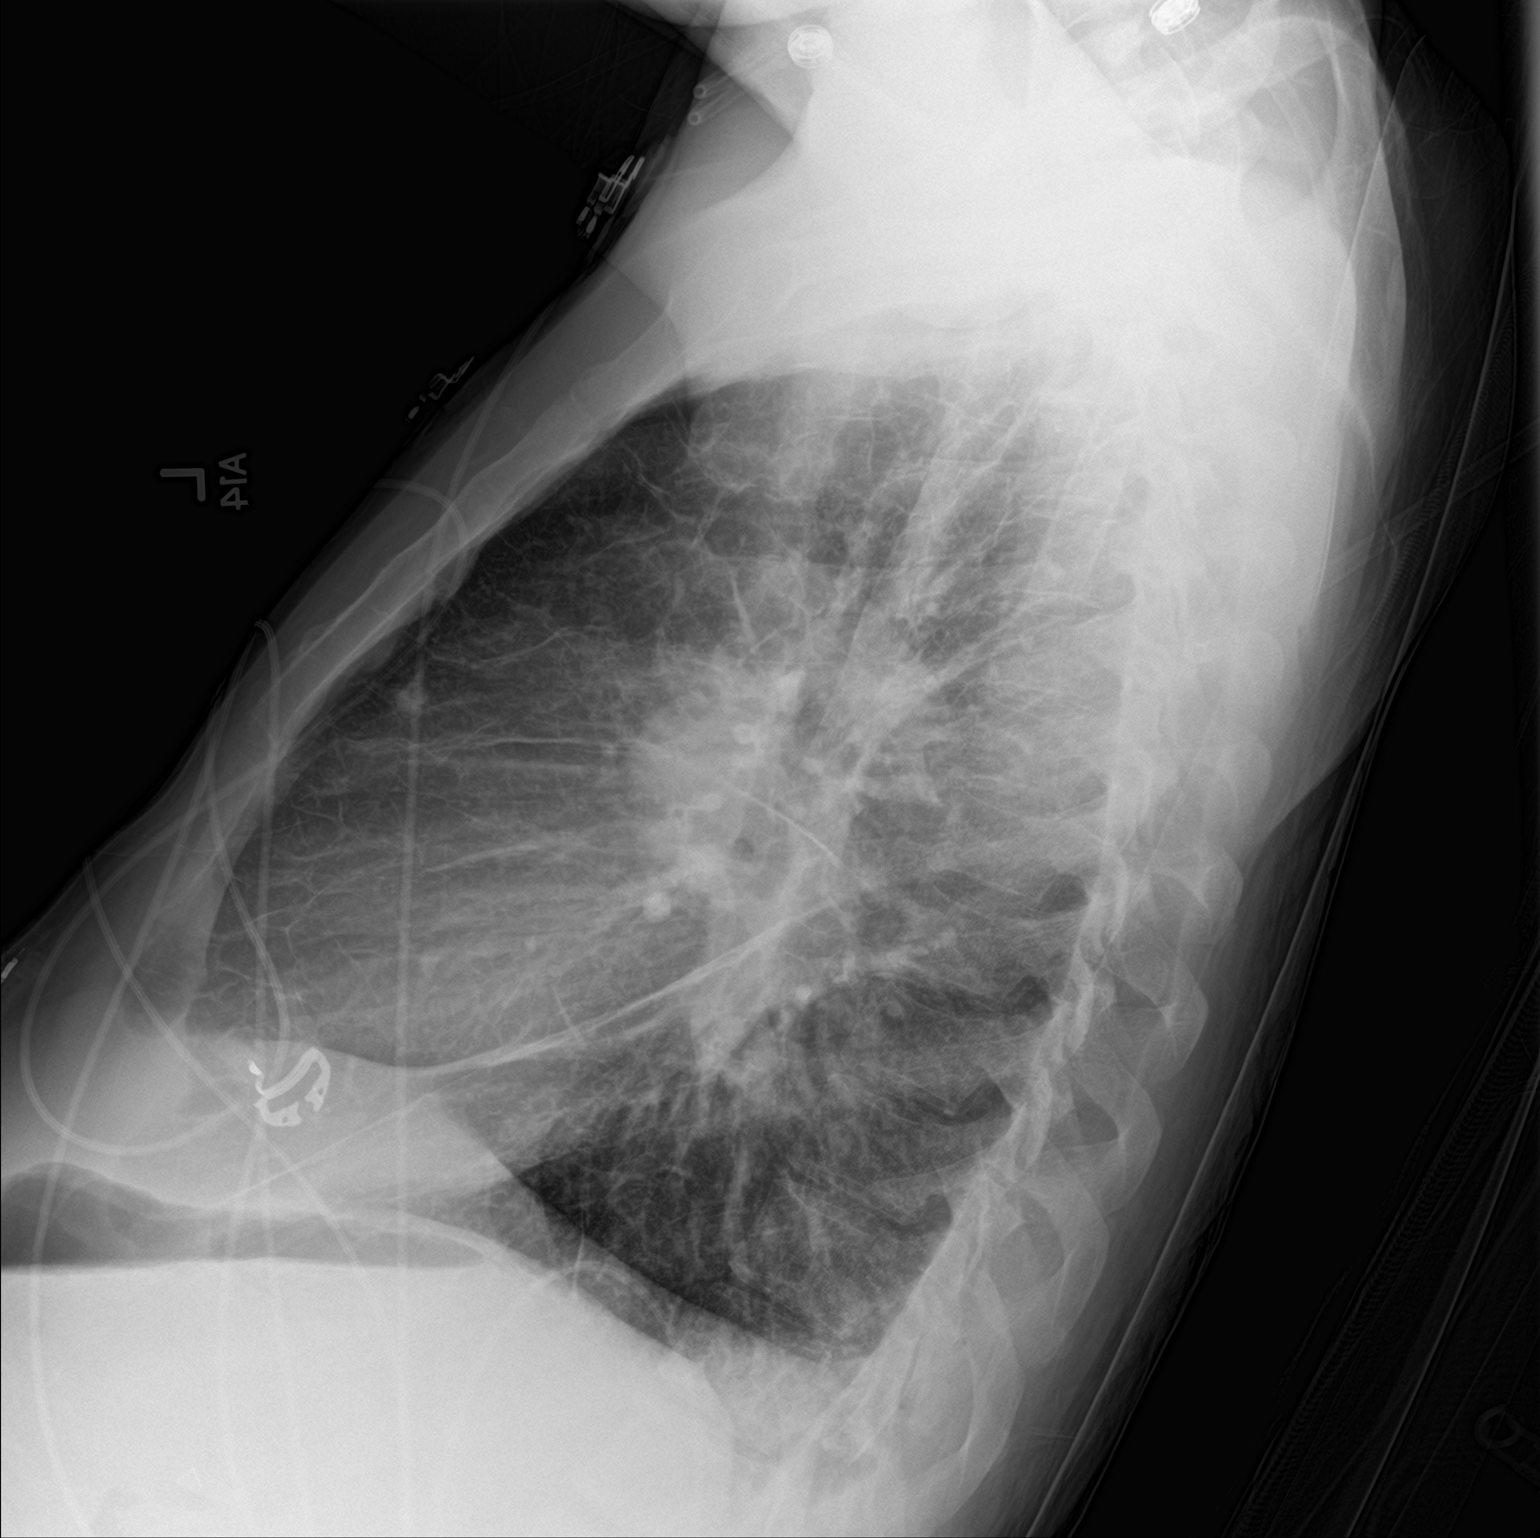

[2 of 2 positions shown; findings below may reference images not displayed]

FINDINGS: Central pulmonary vascular congestion and peripheral interstitial
prominence right greater than left suggesting mild interstitial
edema. No confluent airspace disease.

Heart size and mediastinal contours are within normal limits.

Small left pleural effusion. Small amount of fluid in the interlobar
fissures. No pneumothorax.

Visualized bones unremarkable.
IMPRESSION: 1. Mild interstitial edema with small left effusion.

## 2017-12-22 MED ORDER — METOPROLOL TARTRATE 25 MG PO TABS
25.0000 mg | ORAL_TABLET | Freq: Two times a day (BID) | ORAL | Status: DC
  Administered 2017-12-22 – 2017-12-23 (×3): 25 mg via ORAL
  Filled 2017-12-22 (×3): qty 1

## 2017-12-22 MED ORDER — KETOROLAC TROMETHAMINE 30 MG/ML IJ SOLN
30.0000 mg | Freq: Once | INTRAMUSCULAR | Status: AC
  Administered 2017-12-22: 30 mg via INTRAVENOUS
  Filled 2017-12-22: qty 1

## 2017-12-22 MED ORDER — HYDROMORPHONE HCL 1 MG/ML IJ SOLN
1.0000 mg | INTRAMUSCULAR | Status: DC | PRN
  Administered 2017-12-22 – 2017-12-25 (×15): 1 mg via INTRAVENOUS
  Filled 2017-12-22 (×16): qty 1

## 2017-12-22 MED ORDER — OXYCODONE-ACETAMINOPHEN 7.5-325 MG PO TABS
1.0000 | ORAL_TABLET | ORAL | Status: DC | PRN
  Administered 2017-12-22 – 2017-12-23 (×5): 1 via ORAL
  Filled 2017-12-22 (×5): qty 1

## 2017-12-22 NOTE — Progress Notes (Signed)
IP PROGRESS NOTE  Subjective:  Patient continues to complain of shortness of breath with minimal activity while receiving supplemental oxygen.  No new complaints.  Currently no chest pain.  Continues to have abdominal discomfort in bilateral upper quadrants.  Objective: Vital signs in last 24 hours: Blood pressure (!) 152/79, pulse 73, temperature 98.9 F (37.2 C), temperature source Oral, resp. rate 19, height 6\' 2"  (1.88 m), weight 156 lb 15.5 oz (71.2 kg), SpO2 100 %.  Intake/Output from previous day: 02/01 0701 - 02/02 0700 In: -  Out: 1050 [Urine:1050]  Physical Exam:  Alert, awake, oriented x3.  Visibly increased work of breathing. HEENT: Anicteric sclera, moist mucous membranes, no oral ulcers Lungs: Decreased breath sounds bilaterally and diffusely more so in the lower lung fields Cardiac: S1/S2, regular, no murmurs Abdomen: Some guarding is present, abdomen does not appear to be distended.  Right upper and left upper quadrant tenderness without rebound. Extremities: No significant lower extremity edema Neuro: No gross focal neurological deficit  Lab Results: Recent Labs    12/21/17 0750 12/22/17 0233  WBC 7.9 7.2  HGB 7.9* 8.1*  HCT 22.4* 22.4*  PLT 50* 55*    BMET Recent Labs    12/21/17 0750 12/22/17 0233  NA 135 137  K 3.8 4.1  CL 100* 102  CO2 25 23  GLUCOSE 95 81  BUN 13 15  CREATININE 1.55* 1.54*  CALCIUM 8.9 8.3*    No results found for: CEA1  Studies/Results: [place] Abdomen Complete  Result Date: 12/21/2017 CLINICAL DATA:  Right upper quadrant abdominal pain for 2 days. Prior cholecystectomy. EXAM: ABDOMEN ULTRASOUND COMPLETE COMPARISON:  CT on 12/20/2017 FINDINGS: Gallbladder: Surgically absent. Common bile duct: Diameter: 9 mm, within normal limits status post cholecystectomy. Liver: No focal lesion identified. Within normal limits in parenchymal echogenicity. Portal vein is patent on color Doppler imaging with normal direction of blood flow  towards the liver. IVC: No abnormality visualized. Pancreas: Visualized portion unremarkable. Spleen: Splenomegaly, with length measuring approximately 15 cm and calculated volume of 1000 cc. No splenic masses identified. Right Kidney: Length: 8.9 cm. Echogenicity within normal limits. No mass or hydronephrosis visualized. Left Kidney: Length: 10.2 cm. Echogenicity within normal limits. No mass or hydronephrosis visualized. Abdominal aorta: No aneurysm visualized. Other findings: Small right pleural effusion. IMPRESSION: Prior cholecystectomy.  No evidence of biliary ductal dilatation. Moderate splenomegaly. Small right pleural effusion. Electronically Signed   By: Myles RosenthalJohn  Stahl M.D.   On: 12/21/2017 15:12    Medications: I have reviewed the patient's current medications.  Assessment/Plan: 42 year old male admitted to the hospital with progressive shortness of breath and abdominal pain, found to have hemolytic anemia and moderate thrombocytopenia.  **Hemolytic anemia: Stable to improved hemoglobin level today.  The patient has significant polychromasia. The high indirect hyperbilirubinemia with elevated LDH and splenomegaly with prior history of cholecystectomy, highly suspicious for autoimmune hemolytic anemia, but DAT is negative, iron studies show reactive elevation of ferritin without evidence for iron deficiency.  Vitamin B12 is at the lower edge of normal.  Patient demonstrates splenomegaly consistent with extravascular hemolysis.  Overall, picture is atypical for sickle cell disease which normally is accompanied by splenic infarction and reduced size of spleen due to scarring.  Hemoglobin electrophoresis is pending.  Additional differential includes hereditary hemoglobinopathy such as hereditary spherocytosis, G6PD deficiency.  In addition, patient remarks on [place] and his first exposure to below freezing weather that he can remember just prior to onset of  the symptoms.  Denies any recent toxic exposures or unusual food intake.  No recent snake or spider bites.  --Methylmalonic acid and homocystine levels to assess for supportive evidence of B12 deficiency.  If levels are elevated, will start B12 replacement therapy. --Cryoglobulin and cold agglutinin titers will be drawn tomorrow --Avoid transfusion for now, unless he has ischemic symptoms; Keep Hgb => 7.0g/dL  **Thrombocytopenia: We do not have baseline platelet count. Thrombocytopenia could be related to chronic splenomegaly.  No significant coagulopathy and no schistocytosis on presentation makes TTP unlikely but elevated LDH and presence of renal dysfunction. --Presently, no contraindication to proceed with antiplatelet agent or anticoagulation agent if needed --No need for platelet transfusion unless his blood count is <=20,000  **Mild elevated creatinine: It could be related to recent contrast exposure.  Currently stable --Continue aggressive hydration to reduce the risk of further injury due to hemoglobinuria  **Lt/Rt upper quadrant pain: Could be related to splenomegaly or other issues --Pain medication as needed  **Discharge planning: Without establish diagnosis, discharge planning cannot be accomplished at this time.  Patient still has multiple hematological abnormalities that require close monitoring.  Goals of care Further workup in progress. Please call hematology consult if questions arise.   LOS: 1 day   Daisy Blossom, MD   12/22/2017, 10:49 AM

## 2017-12-22 NOTE — Progress Notes (Signed)
ANTICOAGULATION CONSULT NOTE  Pharmacy Consult for Heparin Indication: chest pain/ACS  Allergies  Allergen Reactions  . Morphine And Related Other (See Comments)    Severe abdominal pain    Patient Measurements: Height: 6\' 2"  (188 cm) Weight: 156 lb 15.5 oz (71.2 kg) IBW/kg (Calculated) : 82.2 Heparin Dosing Weight: 71 kg  Vital Signs: Temp: 99 F (37.2 C) (02/01 2005) Temp Source: Oral (02/01 2005) BP: 140/84 (02/01 2005) Pulse Rate: 73 (02/01 2005)  Labs: Recent Labs    12/21/17 0750 12/21/17 1040 12/21/17 1753 12/22/17 0233  HGB 7.9*  --   --  8.1*  HCT 22.4*  --   --  22.4*  PLT 50*  --   --  55*  HEPARINUNFRC  --  0.20* 0.16* 0.10*  CREATININE 1.55*  --   --  1.54*    Estimated Creatinine Clearance: 62.3 mL/min (A) (by C-G formula based on SCr of 1.54 mg/dL (H)).  Assessment:  42 y.o. male with chest pain for heparin  Goal of Therapy:  Heparin level 0.3-0.5 Monitor platelets by anticoagulation protocol: Yes   Plan:  Increase Heparin  1800 units/hr Check heparin level in 8 hours.  Geannie RisenGreg Zaine Elsass, PharmD, BCPS  12/22/2017,3:43 AM

## 2017-12-22 NOTE — Progress Notes (Signed)
Progress Note  Patient Name: Alejandro FlorBruce Brisco Date of Encounter: 12/22/2017  Primary Cardiologist:   No primary care provider on file.   Subjective   He is complaining of severe left lower abdominal pain.  He is not getting pain relief from current meds.  He does report that the pain radiates up to the chest .   Inpatient Medications    Scheduled Meds: . metoprolol tartrate  12.5 mg Oral BID   Continuous Infusions: . sodium chloride 150 mL/hr at 12/21/17 1634  . heparin 1,800 Units/hr (12/22/17 0429)   PRN Meds: acetaminophen, gi cocktail, HYDROcodone-acetaminophen, ondansetron (ZOFRAN) IV   Vital Signs    Vitals:   12/21/17 1100 12/21/17 1400 12/21/17 2005 12/22/17 0430  BP: (!) 158/100  140/84 (!) 152/79  Pulse:  71 73 73  Resp:  14 17 19   Temp:   99 F (37.2 C) 98.9 F (37.2 C)  TempSrc:   Oral Oral  SpO2:  99% 100% 100%  Weight:      Height:        Intake/Output Summary (Last 24 hours) at 12/22/2017 1031 Last data filed at 12/22/2017 [id number] Gross per 24 hour  Intake -  Output 1050 ml  Net -1050 ml   Filed Weights   12/21/17 0500  Weight: 156 lb 15.5 oz (71.2 kg)    Telemetry    NSR - Personally Reviewed  ECG    NA - Personally Reviewed  Physical Exam   GEN:   He looks somewhat uncomfortable Neck: No  JVD Cardiac: RRR, no murmurs, rubs, or gallops.  Respiratory: Clear  to auscultation bilaterally. GI: Soft, nontender, non-distended  MS: No  edema; No deformity. Neuro:  Nonfocal  Psych: Normal affect   Labs    Chemistry Recent Labs  Lab 12/21/17 0750 12/22/17 0233  NA 135 137  K 3.8 4.1  CL 100* 102  CO2 25 23  GLUCOSE 95 81  BUN 13 15  CREATININE 1.55* 1.54*  CALCIUM 8.9 8.3*  PROT 5.9*  --   ALBUMIN 3.8  --   AST 59*  --   ALT 47  --   ALKPHOS 84  --   BILITOT 6.0*  --   GFRNONAA 53* 54*  GFRAA >60 >60  ANIONGAP 10 12     Hematology Recent Labs  Lab 12/21/17 0750 12/21/17 1753 12/22/17 0233  WBC 7.9  --  7.2  RBC  2.63* 0.57* 2.59*  HGB 7.9*  --  8.1*  HCT 22.4*  --  22.4*  MCV 85.2  --  86.5  MCH 30.0  --  31.3  MCHC 35.3  --  36.2*  RDW 24.2*  --  24.7*  PLT 50*  --  55*    Cardiac EnzymesNo results for input(s): TROPONINI in the last 168 hours. No results for input(s): TROPIPOC in the last 168 hours.   BNP Recent Labs  Lab 12/21/17 0806  BNP 819.1*     DDimer No results for input(s): DDIMER in the last 168 hours.   Radiology    [place] Abdomen Complete  Result Date: 12/21/2017 CLINICAL DATA:  Right upper quadrant abdominal pain for 2 days. Prior cholecystectomy. EXAM: ABDOMEN ULTRASOUND COMPLETE COMPARISON:  CT on 12/20/2017 FINDINGS: Gallbladder: Surgically absent. Common bile duct: Diameter: 9 mm, within normal limits status post cholecystectomy. Liver: No focal lesion identified. Within normal limits in parenchymal echogenicity. Portal vein is patent on color Doppler imaging with normal direction of blood flow towards the liver. IVC:  No abnormality visualized. Pancreas: Visualized portion unremarkable. Spleen: Splenomegaly, with length measuring approximately 15 cm and calculated volume of 1000 cc. No splenic masses identified. Right Kidney: Length: 8.9 cm. Echogenicity within normal limits. No mass or hydronephrosis visualized. Left Kidney: Length: 10.2 cm. Echogenicity within normal limits. No mass or hydronephrosis visualized. Abdominal aorta: No aneurysm visualized. Other findings: Small right pleural effusion. IMPRESSION: Prior cholecystectomy.  No evidence of biliary ductal dilatation. Moderate splenomegaly. Small right pleural effusion. Electronically Signed   By: Myles Rosenthal M.D.   On: 12/21/2017 15:12    Cardiac Studies   Study Conclusions  - Left ventricle: The cavity size was normal. There was mild   concentric hypertrophy. Systolic function was normal. The   estimated ejection fraction was in the range of 60% to 65%. Wall   motion was normal; there were no regional wall motion    abnormalities. Features are consistent with a pseudonormal left   ventricular filling pattern, with concomitant abnormal relaxation   and increased filling pressure (grade 2 diastolic dysfunction). - Aortic valve: There was moderate regurgitation. - Mitral valve: There was mild regurgitation directed centrally. - Left atrium: The atrium was mildly dilated. - Right ventricle: The cavity size was mildly dilated. - Right atrium: The atrium was mildly dilated. - Pulmonary arteries: Systolic pressure was moderately increased.   PA peak pressure: 63 mm Hg (S).  Patient Profile     42 y.o. male with a hx of HTN, glaucoma since the age of 23, intermittent tobacco and mariajuana use and family h/o sickle cell anemia trait (mother), transferred from [facility] for evaluation for chest pain + hematologic w/u (concerns for TTP)  who is being seen for the evaluation of chest pain, at the request of Dr. Luberta Robertson, Internal Medicine.   Assessment & Plan    CHEST PAIN:    Felt to be non anginal.  No further ischemia work up.    HYPERTENSIVE HEART DISEASE:  EF as above.  He is now on beta blocker for this. I will titrate this slightly today.    PAH:  Question related to diastolic HF.  Manage BP and volume.  Net negative one liter.    AI:  Moderate on echo.  Follow clinically.    HEMOLYTIC ANEMIA:  Heme work up is underway.     For questions or updates, please contact [facility] Please consult [email or web address] for contact info under Cardiology/STEMI.   Signed, Rollene Rotunda, MD  12/22/2017, 10:31 AM

## 2017-12-22 NOTE — Plan of Care (Signed)
  Progressing Education: Knowledge of General Education information will improve 12/22/2017 0017 - Progressing by Ruel FavorsBrown-Staples, Jakelyn Squyres, RN Health Behavior/Discharge Planning: Ability to manage health-related needs will improve 12/22/2017 0017 - Progressing by Ruel FavorsBrown-Staples, Eleftherios Dudenhoeffer, RN Clinical Measurements: Ability to maintain clinical measurements within normal limits will improve 12/22/2017 0017 - Progressing by Ruel FavorsBrown-Staples, Renell Coaxum, RN Will remain free from infection 12/22/2017 0017 - Progressing by Ruel FavorsBrown-Staples, Arna Luis, RN Diagnostic test results will improve 12/22/2017 0017 - Progressing by Ruel FavorsBrown-Staples, Tatumn Corbridge, RN Respiratory complications will improve 12/22/2017 0017 - Progressing by Ruel FavorsBrown-Staples, Gentry Seeber, RN Cardiovascular complication will be avoided 12/22/2017 0017 - Progressing by Ruel FavorsBrown-Staples, Marshe Shrestha, RN Activity: Risk for activity intolerance will decrease 12/22/2017 0017 - Progressing by Ruel FavorsBrown-Staples, Parks Czajkowski, RN Nutrition: Adequate nutrition will be maintained 12/22/2017 0017 - Progressing by Ruel FavorsBrown-Staples, Early Ord, RN Coping: Level of anxiety will decrease 12/22/2017 0017 - Progressing by Ruel FavorsBrown-Staples, Dub Maclellan, RN Elimination: Will not experience complications related to bowel motility 12/22/2017 0017 - Progressing by Ruel FavorsBrown-Staples, Frimet Durfee, RN Will not experience complications related to urinary retention 12/22/2017 0017 - Progressing by Ruel FavorsBrown-Staples, Hassani Sliney, RN Pain Managment: General experience of comfort will improve Description Will continue to monitor pain and medicate as needed  12/22/2017 0017 - Progressing by Ruel FavorsBrown-Staples, Hattie Aguinaldo, RN Safety: Ability to remain free from injury will improve 12/22/2017 0017 - Progressing by Ruel FavorsBrown-Staples, Luis Sami, RN Skin Integrity: Risk for impaired skin integrity will decrease 12/22/2017 0017 - Progressing by Ruel FavorsBrown-Staples, Sinthia Karabin, RN

## 2017-12-22 NOTE — Progress Notes (Addendum)
Hospitalist progress note   Alejandro Perez  QMV:[id number] DOB: 08-Sep-1976 DOA: 12/21/2017 PCP: No primary care provider on file.   Specialists:   Brief Narrative:  45 male, intermittent Tob use, Marijuana  2-3 week, sickle cell trait h/o unaccustomed exertional dyspnea + pleuritic CP 2 days--found to have some blood dyscrasias Hemoglobin 9.3 PLT 30  No Schistocytes Creatinine 1.4 [prior nl] BNP 4020 Given pleuritis CP Heparin was started  Assessment & Plan:   Assessment:  The encounter diagnosis was Abdominal pain, RUQ.  Hemolytic anemia-Haptoglobin <10, LDH 351, ESR 10, crp 1.8 DAT neg, hepatitis panel is neg - await work-up including ANA--given haptoglobin <10 and bili ~ 6 on admit was elevated-almost certainly a hemolytic process and not TTP.  Will await work up for [employer] and defer to Hematology  ? Chest pain and abd pain-unlikely Cardiac--would fit possibly with acute chest syndrome?  Rpt CXR this am--Norco not helping-allow for Percocet and will dose with Dilaudid--can possibly d/c heparin gtt as per cardiology--cont metoprolol 12.5 bid for now   DVT prophylaxis: on hep Gtt  Code Status:   Full    Family Communication:   none  Disposition Plan: inpatient   Consultants:   Heme  cardiology  Procedures:   nad  Antimicrobials:   none   Subjective:  in pain  Diaphoretic No n/v abd pain and CP noted  Objective: Vitals:   12/21/17 1100 12/21/17 1400 12/21/17 2005 12/22/17 0430  BP: (!) 158/100  140/84 (!) 152/79  Pulse:  71 73 73  Resp:  '14 17 19  '$ Temp:   99 F (37.2 C) 98.9 F (37.2 C)  TempSrc:   Oral Oral  SpO2:  99% 100% 100%  Weight:      Height:        Intake/Output Summary (Last 24 hours) at 12/22/2017 1037 Last data filed at 12/22/2017 0333 Gross per 24 hour  Intake -  Output 1050 ml  Net -1050 ml   Filed Weights   12/21/17 0500  Weight: 71.2 kg (156 lb 15.5 oz)    Examination: eomi ncat No ict, mild pallor s1 s2 no m/r/g Midline  scar in abd -SPlenomegally + and tender No le edema   Data Reviewed: I have personally reviewed following labs and imaging studies  CBC: Recent Labs  Lab 12/21/17 0750 12/22/17 0233  WBC 7.9 7.2  NEUTROABS  --  4.6  HGB 7.9* 8.1*  HCT 22.4* 22.4*  MCV 85.2 86.5  PLT 50* 55*   Basic Metabolic Panel: Recent Labs  Lab 12/21/17 0750 12/22/17 0233  NA 135 137  K 3.8 4.1  CL 100* 102  CO2 25 23  GLUCOSE 95 81  BUN 13 15  CREATININE 1.55* 1.54*  CALCIUM 8.9 8.3*   GFR: Estimated Creatinine Clearance: 62.3 mL/min (A) (by C-G formula based on SCr of 1.54 mg/dL (H)). Liver Function Tests: Recent Labs  Lab 12/21/17 0750  AST 59*  ALT 47  ALKPHOS 84  BILITOT 6.0*  PROT 5.9*  ALBUMIN 3.8   No results for input(s): LIPASE, AMYLASE in the last 168 hours. No results for input(s): AMMONIA in the last 168 hours. Coagulation Profile: No results for input(s): INR, PROTIME in the last 168 hours. Cardiac Enzymes: No results for input(s): CKTOTAL, CKMB, CKMBINDEX, TROPONINI in the last 168 hours. CBG: No results for input(s): GLUCAP in the last 168 hours. Urine analysis:    Component Value Date/Time   COLORURINE YELLOW 12/21/2017 [place] 12/21/2017 1156  LABSPEC 1.010 12/21/2017 1156   PHURINE 7.0 12/21/2017 1156   GLUCOSEU NEGATIVE 12/21/2017 1156   HGBUR TRACE (A) 12/21/2017 1156   BILIRUBINUR SMALL (A) 12/21/2017 [place] 12/21/2017 [place] 12/21/2017 1156   NITRITE NEGATIVE 12/21/2017 [place] 12/21/2017 1156     Radiology Studies: Reviewed images personally in health database    Scheduled Meds: . metoprolol tartrate  12.5 mg Oral BID   Continuous Infusions: . sodium chloride 150 mL/hr at 12/21/17 1634  . heparin 1,800 Units/hr (12/22/17 0429)     LOS: 1 day    Time spent: [place], MD [facility] (P) [phone]   If 7PM-7AM, please contact  night-coverage [email or web address] Password [facility] 12/22/2017, 10:37 AM

## 2017-12-23 ENCOUNTER — Inpatient Hospital Stay (HOSPITAL_COMMUNITY): Payer: Medicaid - Out of State

## 2017-12-23 LAB — CBC WITH DIFFERENTIAL/PLATELET
BASOS PCT: 1 %
Basophils Absolute: 0.1 10*3/uL (ref 0.0–0.1)
EOS PCT: 8 %
Eosinophils Absolute: 0.6 10*3/uL (ref 0.0–0.7)
HCT: 21 % — ABNORMAL LOW (ref 39.0–52.0)
HEMOGLOBIN: 7.5 g/dL — AB (ref 13.0–17.0)
LYMPHS PCT: 18 %
Lymphs Abs: 1.4 10*3/uL (ref 0.7–4.0)
MCH: 31.1 pg (ref 26.0–34.0)
MCHC: 35.7 g/dL (ref 30.0–36.0)
MCV: 87.1 fL (ref 78.0–100.0)
MONO ABS: 0.5 10*3/uL (ref 0.1–1.0)
MONOS PCT: 6 %
Neutro Abs: 5 10*3/uL (ref 1.7–7.7)
Neutrophils Relative %: 67 %
Platelets: 55 10*3/uL — ABNORMAL LOW (ref 150–400)
RBC: 2.41 MIL/uL — AB (ref 4.22–5.81)
RDW: 25 % — ABNORMAL HIGH (ref 11.5–15.5)
WBC: 7.6 10*3/uL (ref 4.0–10.5)

## 2017-12-23 LAB — COMPREHENSIVE METABOLIC PANEL
ALK PHOS: 73 U/L (ref 38–126)
ALT: 29 U/L (ref 17–63)
AST: 27 U/L (ref 15–41)
Albumin: 3.8 g/dL (ref 3.5–5.0)
Anion gap: 9 (ref 5–15)
BUN: 17 mg/dL (ref 6–20)
CALCIUM: 8.6 mg/dL — AB (ref 8.9–10.3)
CHLORIDE: 104 mmol/L (ref 101–111)
CO2: 23 mmol/L (ref 22–32)
CREATININE: 1.74 mg/dL — AB (ref 0.61–1.24)
GFR calc Af Amer: 54 mL/min — ABNORMAL LOW (ref >60)
GFR, EST NON AFRICAN AMERICAN: 46 mL/min — AB (ref >60)
Glucose, Bld: 107 mg/dL — ABNORMAL HIGH (ref 65–99)
Potassium: 4 mmol/L (ref 3.5–5.1)
Sodium: 136 mmol/L (ref 135–145)
Total Bilirubin: 2.9 mg/dL — ABNORMAL HIGH (ref 0.3–1.2)
Total Protein: 5.9 g/dL — ABNORMAL LOW (ref 6.5–8.1)

## 2017-12-23 LAB — ADAMTS13 ACTIVITY: Adamts 13 Activity: 73.2 % (ref >66.8)

## 2017-12-23 LAB — ADAMTS13 ACTIVITY REFLEX

## 2017-12-23 IMAGING — DX DG CHEST 2V
2 series · 2 of 2 positions shown · non-contrast
Comparison: [DATE]

CLINICAL DATA: Pneumonia.

EXAM:
CHEST  2 VIEW

[chest lat]
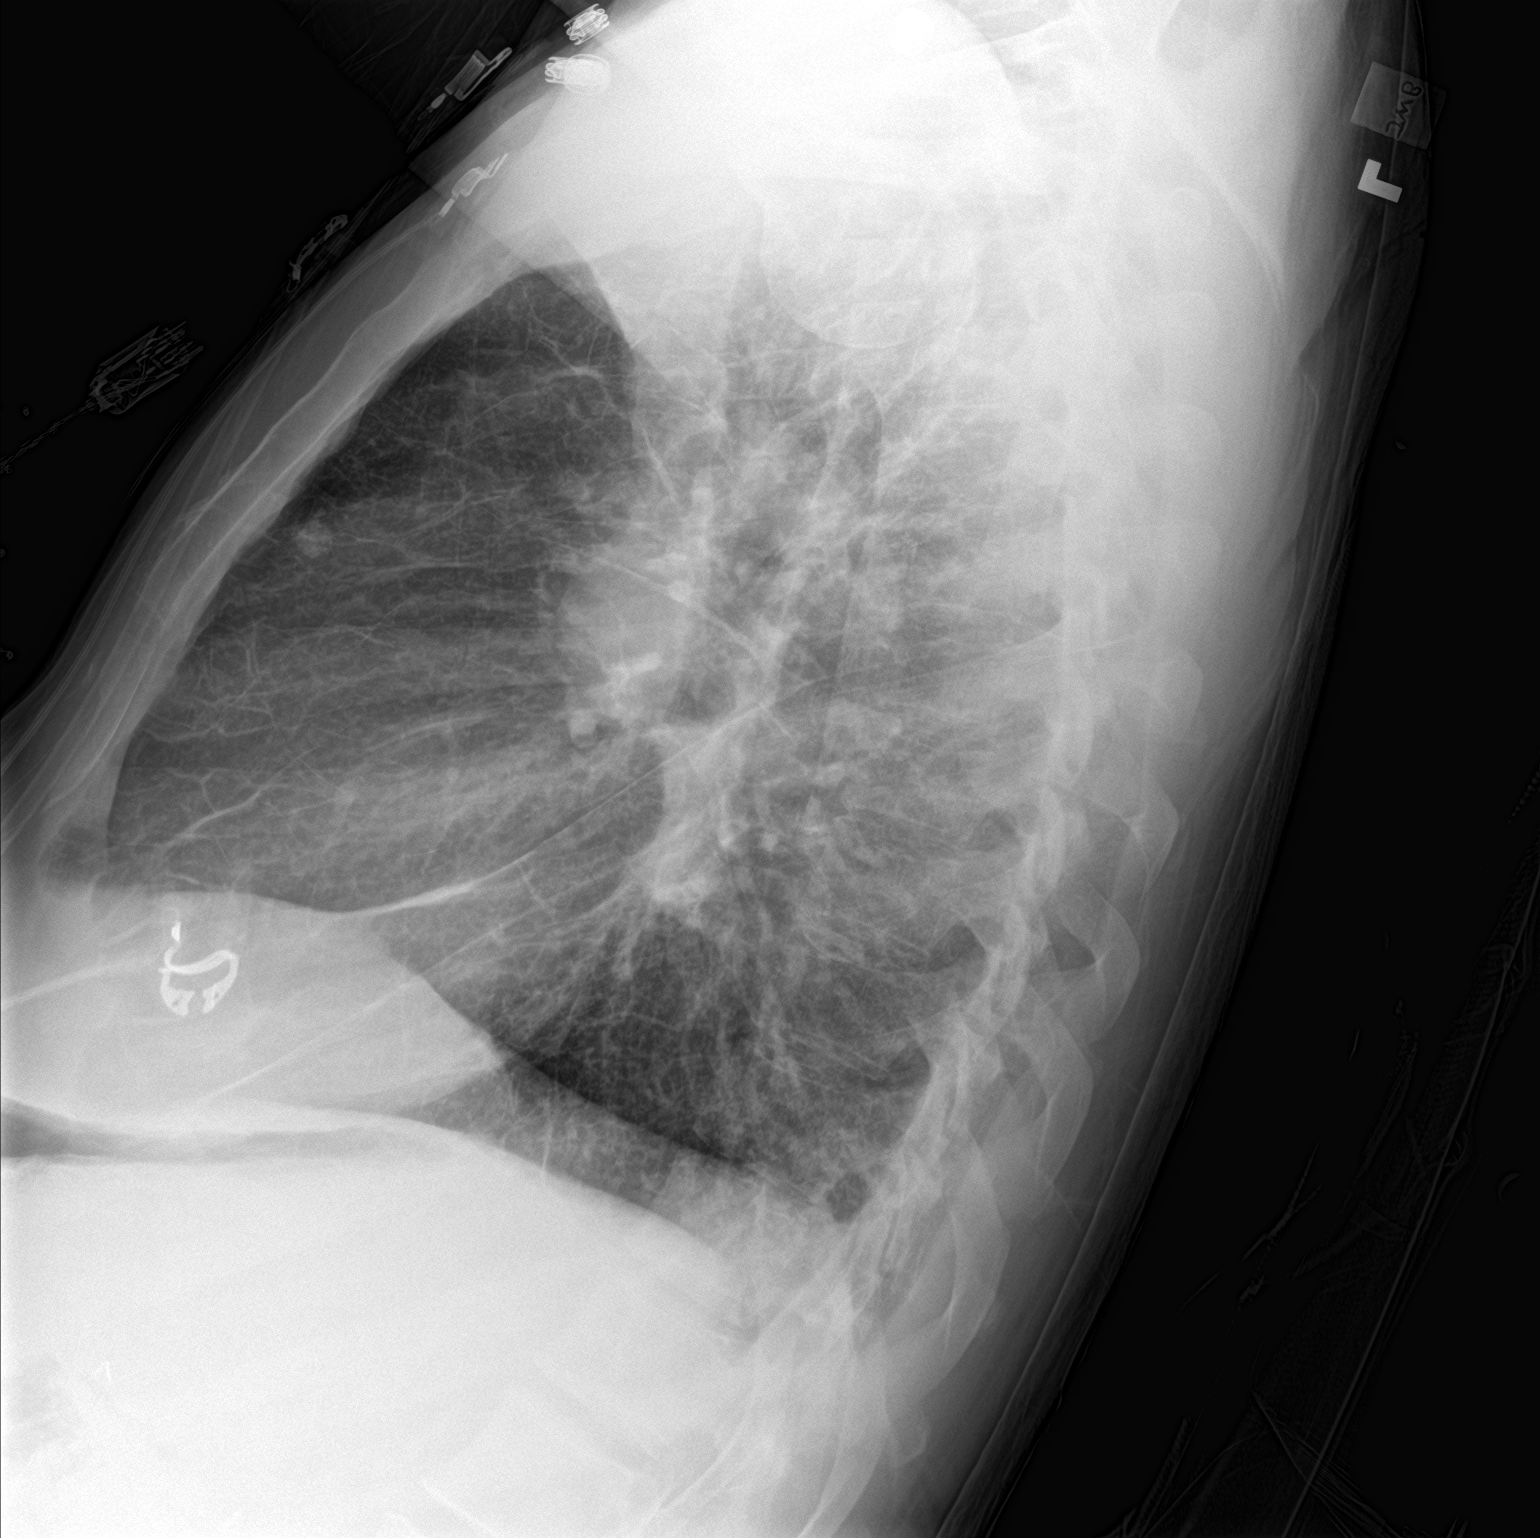

[chest ap]
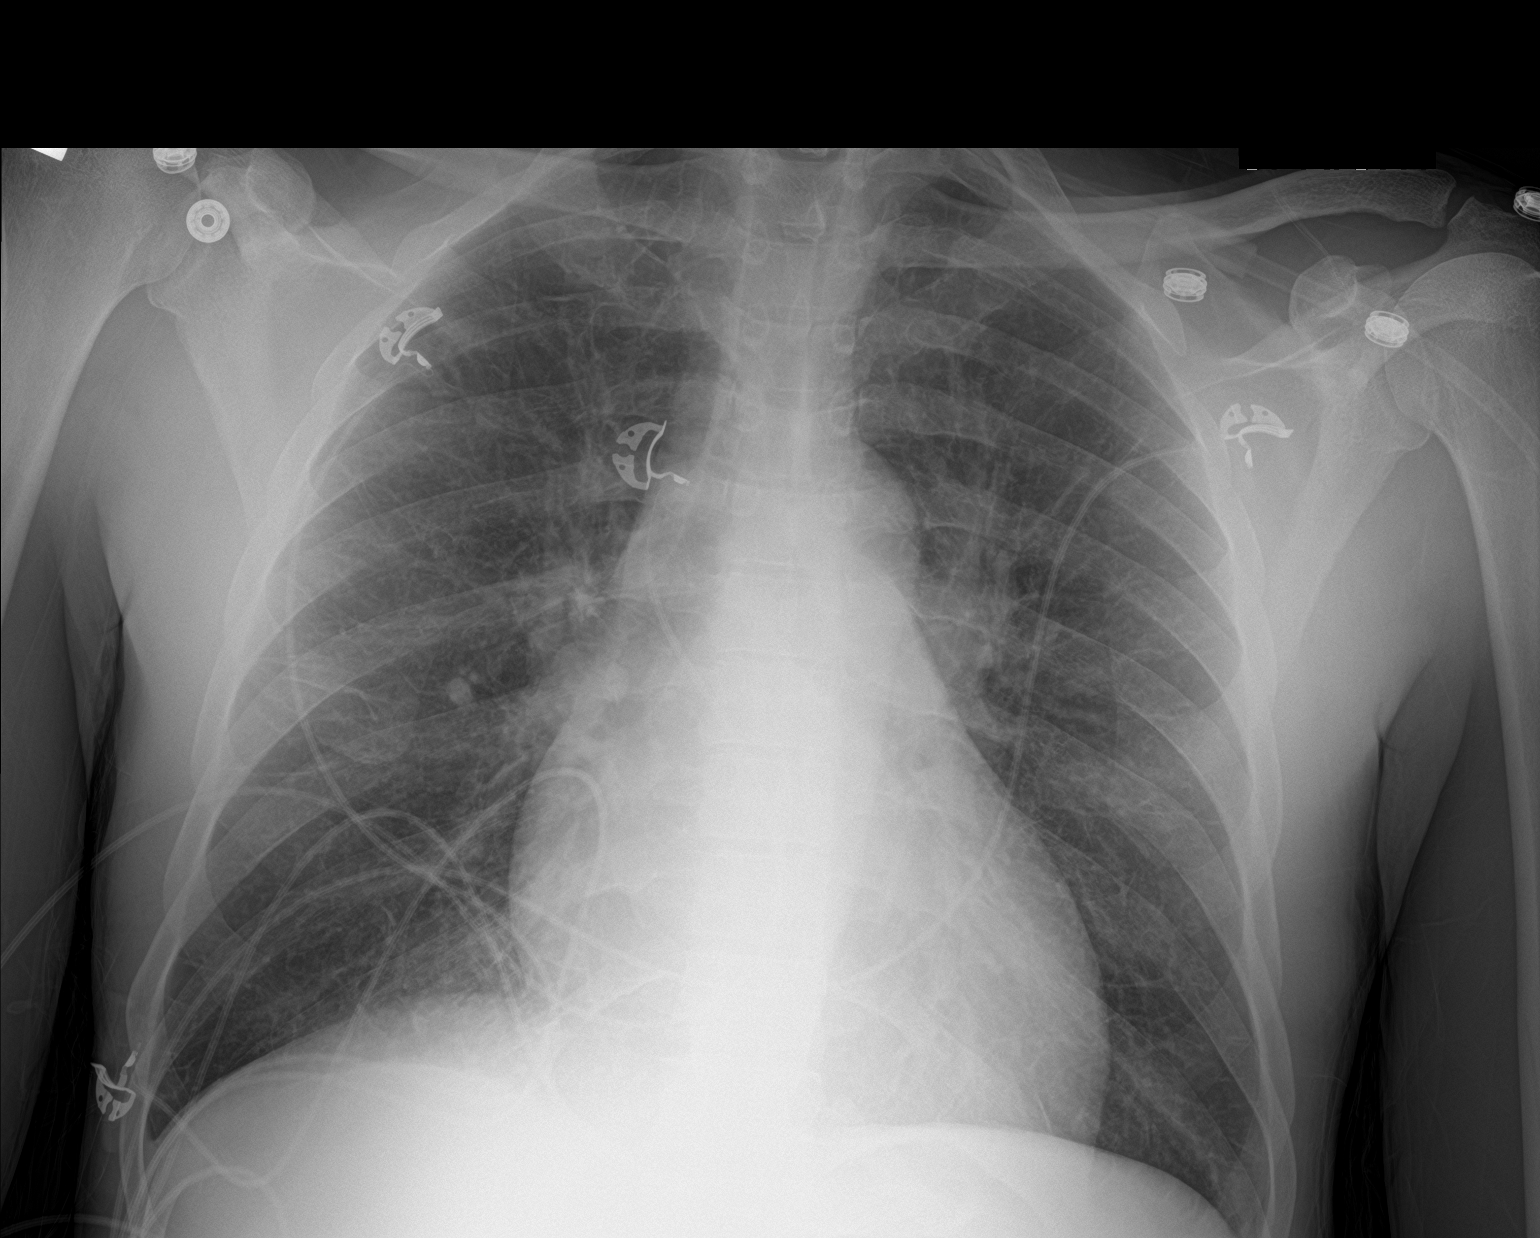

[2 of 2 positions shown; findings below may reference images not displayed]

FINDINGS: The cardiac silhouette is borderline enlarged. Mild interstitial
densities in the right greater than left lungs are unchanged. There
is mild pleural thickening or trace pleural fluid along the
fissures. Small pleural effusions are noted posteriorly in the lung
bases. There is no lobar consolidation or pneumothorax. A calcified
granuloma is noted anteriorly in the right lung. No acute osseous
abnormality is seen.
IMPRESSION: Unchanged mild interstitial densities compatible with edema. Small
pleural effusions.

## 2017-12-23 MED ORDER — OXYCODONE-ACETAMINOPHEN 7.5-325 MG PO TABS
2.0000 | ORAL_TABLET | ORAL | Status: DC | PRN
  Administered 2017-12-23 – 2017-12-24 (×7): 2 via ORAL
  Filled 2017-12-23 (×7): qty 2

## 2017-12-23 MED ORDER — CYANOCOBALAMIN 1000 MCG/ML IJ SOLN
1000.0000 ug | Freq: Once | INTRAMUSCULAR | Status: AC
  Administered 2017-12-23: 1000 ug via INTRAMUSCULAR
  Filled 2017-12-23: qty 1

## 2017-12-23 NOTE — Progress Notes (Signed)
IP PROGRESS NOTE  Subjective:  Patient continues to complain of shortness of breath with minimal activity while receiving supplemental oxygen.  No new complaints.  Currently no chest pain.  Continues to have abdominal discomfort in bilateral upper quadrants.  Objective: Vital signs in last 24 hours: Blood pressure (!) 159/91, pulse 74, temperature 98.9 F (37.2 C), temperature source Oral, resp. rate 16, height 6\' 2"  (1.88 m), weight 156 lb 15.5 oz (71.2 kg), SpO2 99 %.  Intake/Output from previous day: 02/02 0701 - 02/03 0700 In: 240 [P.O.:240] Out: 750 [Urine:750]  Physical Exam:  Alert, awake, oriented x3.  Visibly increased work of breathing. HEENT: Anicteric sclera, moist mucous membranes, no oral ulcers Lungs: Decreased breath sounds bilaterally and diffusely more so in the lower lung fields Cardiac: S1/S2, regular, no murmurs Abdomen: Some guarding is present, abdomen does not appear to be distended.  Right upper and left upper quadrant tenderness without rebound. Extremities: No significant lower extremity edema Neuro: No gross focal neurological deficit  Lab Results: Recent Labs    12/22/17 0233 12/23/17 0416  WBC 7.2 7.6  HGB 8.1* 7.5*  HCT 22.4* 21.0*  PLT 55* 55*    BMET Recent Labs    12/21/17 0750 12/22/17 0233  NA 135 137  K 3.8 4.1  CL 100* 102  CO2 25 23  GLUCOSE 95 81  BUN 13 15  CREATININE 1.55* 1.54*  CALCIUM 8.9 8.3*    No results found for: CEA1  Studies/Results: Dg Chest 2 View  Result Date: 12/22/2017 CLINICAL DATA:  Chest pain with shob x 1 day EXAM: CHEST - 2 VIEW COMPARISON:  CT 12/20/2017 FINDINGS: Central pulmonary vascular congestion and peripheral interstitial prominence right greater than left suggesting mild interstitial edema. No confluent airspace disease. Heart size and mediastinal contours are within normal limits. Small left pleural effusion. Small amount of fluid in the interlobar fissures. No pneumothorax. Visualized bones  unremarkable. IMPRESSION: 1. Mild interstitial edema with small left effusion. Electronically Signed   By: Corlis Leak  Hassell M.D.   On: 12/22/2017 12:18   [place] Abdomen Complete  Result Date: 12/21/2017 CLINICAL DATA:  Right upper quadrant abdominal pain for 2 days. Prior cholecystectomy. EXAM: ABDOMEN ULTRASOUND COMPLETE COMPARISON:  CT on 12/20/2017 FINDINGS: Gallbladder: Surgically absent. Common bile duct: Diameter: 9 mm, within normal limits status post cholecystectomy. Liver: No focal lesion identified. Within normal limits in parenchymal echogenicity. Portal vein is patent on color Doppler imaging with normal direction of blood flow towards the liver. IVC: No abnormality visualized. Pancreas: Visualized portion unremarkable. Spleen: Splenomegaly, with length measuring approximately 15 cm and calculated volume of 1000 cc. No splenic masses identified. Right Kidney: Length: 8.9 cm. Echogenicity within normal limits. No mass or hydronephrosis visualized. Left Kidney: Length: 10.2 cm. Echogenicity within normal limits. No mass or hydronephrosis visualized. Abdominal aorta: No aneurysm visualized. Other findings: Small right pleural effusion. IMPRESSION: Prior cholecystectomy.  No evidence of biliary ductal dilatation. Moderate splenomegaly. Small right pleural effusion. Electronically Signed   By: Myles RosenthalJohn  Stahl M.D.   On: 12/21/2017 15:12    Medications: I have reviewed the patient's current medications.  Assessment/Plan: 42 year old male admitted to the hospital with progressive shortness of breath and abdominal pain, found to have hemolytic anemia and moderate thrombocytopenia.  **Hemolytic anemia: Stable to improved hemoglobin level today.  The patient has significant polychromasia. The high indirect hyperbilirubinemia with elevated LDH and splenomegaly with prior history of cholecystectomy, highly suspicious for autoimmune hemolytic anemia, but DAT is negative, iron studies show  reactive elevation of ferritin  without evidence for iron deficiency.  Vitamin B12 is at the lower edge of normal.  Patient demonstrates splenomegaly consistent with extravascular hemolysis.  Overall, picture is atypical for sickle cell disease which normally is accompanied by splenic infarction and reduced size of spleen due to scarring. Additional differential includes hereditary hemoglobinopathy such as hereditary spherocytosis, G6PD deficiency.  In addition, patient remarks on recent vision from [place] to [place] and his first exposure to below freezing weather that he can remember just prior to onset of the symptoms.  Denies any recent toxic exposures or unusual food intake.  No recent snake or spider bites. Homocystine, methylmalonic acid, cryoglobulin, cold agglutinin, hemoglobin electrophoresis, and ANA are pending. --Start B12 injections -- IM today --Avoid transfusion for now, unless he has ischemic symptoms; Keep Hgb => 7.0g/dL  **Thrombocytopenia: We do not have baseline platelet count. Thrombocytopenia could be related to chronic splenomegaly.  No significant coagulopathy and no schistocytosis on presentation makes TTP unlikely but elevated LDH and presence of renal dysfunction.  Vital count is stable from yesterday at 55,000. --Presently, no contraindication to proceed with antiplatelet agent or anticoagulation agent if needed --No need for platelet transfusion unless his blood count is <=20,000  **Mild elevated creatinine: It could be related to recent contrast exposure.  Currently stable --Continue aggressive hydration to reduce the risk of further injury due to hemoglobinuria  **Lt/Rt upper quadrant pain: Could be related to splenomegaly or other issues --Pain medication as needed  **Discharge planning: Without establish diagnosis, discharge planning cannot be accomplished at this time.  Patient still has multiple hematological abnormalities that require close monitoring.  Goals of  care Further workup in progress. Please call hematology consult if questions arise.   LOS: 2 days   Daisy Blossom, MD   12/23/2017, 9:50 AM

## 2017-12-23 NOTE — Progress Notes (Signed)
Hospitalist progress note   Alejandro Perez  MHD:[id number] DOB: 22-Dec-1975 DOA: 12/21/2017 PCP: No primary care provider on file.   Specialists:   Brief Narrative:  46 male, intermittent Tob use, Marijuana 2-3 week, sickle cell trait h/o unaccustomed exertional dyspnea + pleuritic CP 2 days--found to have some blood dyscrasias Hemoglobin 9.3 PLT 30  No Schistocytes Creatinine 1.4 [prior nl] BNP 4020 Given pleuritis CP Heparin was started  Assessment & Plan:   Assessment:  Diagnoses of Abdominal pain, RUQ and Chest pain were pertinent to this visit.  Hemolytic anemia-Haptoglobin <10, LDH 351, ESR 10, crp 1.8 DAT neg, hepatitis panel is neg - await fruther LAB work-up--given haptoglobin <10 and bili ~ 6 on admit was elevated-almost certainly a hemolytic process and not TTP.  Labs are still pending and Heme input appreciated ? Chest pain and abd pain-unlikely Cardiac--would fit possibly with acute chest syndrome?  Rpt CXR showed only minimal fluid-ioncrease Percocet to 2 tab q4 and will dose with Dilaudid-cont metoprolol 12.5 bid for now   DVT prophylaxis: on hep Gtt  Code Status:   Full    Family Communication:   none  Disposition Plan: inpatient   Consultants:   Heme  cardiology  Procedures:   nad  Antimicrobials:   none   Subjective:  Pain 7/10 Winded with moving around in room-usually is very active and strong and able to do atheletics No Cardiac like CP abd pain+ Nursing reports not taking scheduled meds at night  Objective: Vitals:   12/22/17 2036 12/22/17 2151 12/22/17 2311 12/23/17 0330  BP: (!) 187/104 (!) 168/98 (!) 175/100 (!) 159/91  Pulse: 80 77  74  Resp: 15   16  Temp: 98.1 F (36.7 C)  98.9 F (37.2 C) 98.9 F (37.2 C)  TempSrc: Oral  Oral Oral  SpO2: 100%   99%  Weight:      Height:        Intake/Output Summary (Last 24 hours) at 12/23/2017 1055 Last data filed at 12/23/2017 0900 Gross per 24 hour  Intake 240 ml  Output 1050 ml  Net -810 ml    Filed Weights   12/21/17 0500  Weight: 71.2 kg (156 lb 15.5 oz)    Examination:   eomi ncat No ict, mild pallor s1 s2 no m/r/g Midline scar in abd -Splenomegally + and tender No le edema   Data Reviewed: I have personally reviewed following labs and imaging studies  CBC: Recent Labs  Lab 12/21/17 0750 12/22/17 0233 12/23/17 0416  WBC 7.9 7.2 7.6  NEUTROABS  --  4.6 5.0  HGB 7.9* 8.1* 7.5*  HCT 22.4* 22.4* 21.0*  MCV 85.2 86.5 87.1  PLT 50* 55* 55*   Basic Metabolic Panel: Recent Labs  Lab 12/21/17 0750 12/22/17 0233 12/23/17 0910  NA 135 137 136  K 3.8 4.1 4.0  CL 100* 102 104  CO2 '25 23 23  '$ GLUCOSE 95 81 107*  BUN '13 15 17  '$ CREATININE 1.55* 1.54* 1.74*  CALCIUM 8.9 8.3* 8.6*   GFR: Estimated Creatinine Clearance: 55.1 mL/min (A) (by C-G formula based on SCr of 1.74 mg/dL (H)). Liver Function Tests: Recent Labs  Lab 12/21/17 0750 12/23/17 0910  AST 59* 27  ALT 47 29  ALKPHOS 84 73  BILITOT 6.0* 2.9*  PROT 5.9* 5.9*  ALBUMIN 3.8 3.8   No results for input(s): LIPASE, AMYLASE in the last 168 hours. No results for input(s): AMMONIA in the last 168 hours. Coagulation Profile: Recent Labs  Lab  12/22/17 1149  INR 1.15   Cardiac Enzymes: No results for input(s): CKTOTAL, CKMB, CKMBINDEX, TROPONINI in the last 168 hours. CBG: No results for input(s): GLUCAP in the last 168 hours. Urine analysis:    Component Value Date/Time   COLORURINE YELLOW 12/21/2017 [place] 12/21/2017 1156   LABSPEC 1.010 12/21/2017 1156   PHURINE 7.0 12/21/2017 1156   GLUCOSEU NEGATIVE 12/21/2017 1156   HGBUR TRACE (A) 12/21/2017 1156   BILIRUBINUR SMALL (A) 12/21/2017 1156   [place] 12/21/2017 1156   PROTEINUR NEGATIVE 12/21/2017 1156   NITRITE NEGATIVE 12/21/2017 1156   LEUKOCYTESUR NEGATIVE 12/21/2017 1156     Radiology Studies: Reviewed images personally in health database    Scheduled Meds: . metoprolol tartrate  25 mg Oral  BID   Continuous Infusions:    LOS: 2 days    Time spent: [place], MD [facility] (P) [phone]   If 7PM-7AM, please contact night-coverage [email or web address] Password [facility] 12/23/2017, 10:55 AM

## 2017-12-24 DIAGNOSIS — R1012 Left upper quadrant pain: Secondary | ICD-10-CM

## 2017-12-24 DIAGNOSIS — I272 Pulmonary hypertension, unspecified: Secondary | ICD-10-CM

## 2017-12-24 DIAGNOSIS — E538 Deficiency of other specified B group vitamins: Secondary | ICD-10-CM

## 2017-12-24 LAB — HEMOGLOBINOPATHY EVALUATION
HGB A2 QUANT: 2.2 % (ref 1.8–3.2)
Hgb A: 96.9 % (ref 96.4–98.8)
Hgb C: 0 %
Hgb F Quant: 0.9 % (ref 0.0–2.0)
Hgb S Quant: 0 %
Hgb Variant: 0 %

## 2017-12-24 LAB — CBC WITH DIFFERENTIAL/PLATELET
BASOS PCT: 1 %
Basophils Absolute: 0.1 10*3/uL (ref 0.0–0.1)
EOS ABS: 0.9 10*3/uL — AB (ref 0.0–0.7)
Eosinophils Relative: 11 %
HEMATOCRIT: 20.2 % — AB (ref 39.0–52.0)
HEMOGLOBIN: 7.3 g/dL — AB (ref 13.0–17.0)
LYMPHS PCT: 21 %
Lymphs Abs: 1.8 10*3/uL (ref 0.7–4.0)
MCH: 31.1 pg (ref 26.0–34.0)
MCHC: 36.1 g/dL — ABNORMAL HIGH (ref 30.0–36.0)
MCV: 86 fL (ref 78.0–100.0)
MONOS PCT: 6 %
Monocytes Absolute: 0.5 10*3/uL (ref 0.1–1.0)
Neutro Abs: 5.1 10*3/uL (ref 1.7–7.7)
Neutrophils Relative %: 61 %
Platelets: 52 10*3/uL — ABNORMAL LOW (ref 150–400)
RBC: 2.35 MIL/uL — ABNORMAL LOW (ref 4.22–5.81)
RDW: 24.5 % — ABNORMAL HIGH (ref 11.5–15.5)
WBC: 8.4 10*3/uL (ref 4.0–10.5)

## 2017-12-24 LAB — COLD AGGLUTININ TITER: COLD AGGLUTININ TITER: NEGATIVE (ref <1:32)

## 2017-12-24 LAB — HOMOCYSTEINE: Homocysteine: 14.2 umol/L (ref 0.0–15.0)

## 2017-12-24 LAB — ABO/RH: ABO/RH(D): A POS

## 2017-12-24 LAB — ANTINUCLEAR ANTIBODIES, IFA: ANTINUCLEAR ANTIBODIES, IFA: NEGATIVE

## 2017-12-24 LAB — PREPARE RBC (CROSSMATCH)

## 2017-12-24 MED ORDER — DIPHENHYDRAMINE HCL 25 MG PO CAPS
25.0000 mg | ORAL_CAPSULE | Freq: Once | ORAL | Status: AC
  Administered 2017-12-24: 25 mg via ORAL
  Filled 2017-12-24: qty 1

## 2017-12-24 MED ORDER — METOPROLOL TARTRATE 25 MG PO TABS
25.0000 mg | ORAL_TABLET | Freq: Two times a day (BID) | ORAL | Status: DC
  Administered 2017-12-24 – 2017-12-25 (×2): 25 mg via ORAL
  Filled 2017-12-24 (×2): qty 1

## 2017-12-24 MED ORDER — METOPROLOL TARTRATE 25 MG PO TABS: 25.0000 mg | ORAL_TABLET | Freq: Once | ORAL | Status: DC

## 2017-12-24 MED ORDER — METOPROLOL TARTRATE 50 MG PO TABS
50.0000 mg | ORAL_TABLET | Freq: Two times a day (BID) | ORAL | Status: DC
Start: 2017-12-24 — End: 2017-12-24

## 2017-12-24 MED ORDER — CYANOCOBALAMIN 1000 MCG/ML IJ SOLN
1000.0000 ug | Freq: Once | INTRAMUSCULAR | Status: AC
  Administered 2017-12-24: 1000 ug via INTRAMUSCULAR
  Filled 2017-12-24 (×3): qty 1

## 2017-12-24 MED ORDER — VITAMIN B-12 1000 MCG PO TABS
1000.0000 ug | ORAL_TABLET | Freq: Every day | ORAL | Status: DC
  Administered 2017-12-25: 1000 ug via ORAL
  Filled 2017-12-24: qty 1

## 2017-12-24 MED ORDER — FOLIC ACID 1 MG PO TABS
1.0000 mg | ORAL_TABLET | Freq: Every day | ORAL | Status: DC
  Administered 2017-12-24 – 2017-12-25 (×2): 1 mg via ORAL
  Filled 2017-12-24 (×2): qty 1

## 2017-12-24 MED ORDER — SODIUM CHLORIDE 0.9 % IV SOLN: Freq: Once | INTRAVENOUS | Status: DC

## 2017-12-24 MED ORDER — FUROSEMIDE 10 MG/ML IJ SOLN
20.0000 mg | Freq: Once | INTRAMUSCULAR | Status: AC
  Administered 2017-12-24: 20 mg via INTRAVENOUS
  Filled 2017-12-24: qty 2

## 2017-12-24 MED ORDER — AMLODIPINE BESYLATE 5 MG PO TABS
2.5000 mg | ORAL_TABLET | Freq: Every day | ORAL | Status: DC
  Administered 2017-12-24 – 2017-12-25 (×2): 2.5 mg via ORAL
  Filled 2017-12-24 (×2): qty 1

## 2017-12-24 MED ORDER — ACETAMINOPHEN 325 MG PO TABS
650.0000 mg | ORAL_TABLET | Freq: Once | ORAL | Status: AC
  Administered 2017-12-24: 650 mg via ORAL
  Filled 2017-12-24: qty 2

## 2017-12-24 NOTE — Progress Notes (Signed)
Alejandro Perez   DOB:1976-05-11   MW#:[id number]    Subjective: He continues to have intermittent chest pain and shortness of breath. The patient denies any recent signs or symptoms of bleeding such as spontaneous epistaxis, hematuria or hematochezia.   Objective:  Vitals:   12/23/17 2039 12/24/17 0350  BP: (!) 160/87 (!) 150/90  Pulse: 81 74  Resp: 17 11  Temp: 97.8 F (36.6 C) 98.6 F (37 C)  SpO2: 98% 93%     Intake/Output Summary (Last 24 hours) at 12/24/2017 [id number] Last data filed at 12/24/2017 0353 Gross per 24 hour  Intake 720 ml  Output 875 ml  Net -155 ml    GENERAL:alert, no distress and comfortable NEURO: alert & oriented x 3 with fluent speech, no focal motor/sensory deficits   Labs:  Lab Results  Component Value Date   WBC 8.4 12/24/2017   HGB 7.3 (L) 12/24/2017   HCT 20.2 (L) 12/24/2017   MCV 86.0 12/24/2017   PLT 52 (L) 12/24/2017   NEUTROABS PENDING 12/24/2017    Lab Results  Component Value Date   NA 136 12/23/2017   K 4.0 12/23/2017   CL 104 12/23/2017   CO2 23 12/23/2017    Studies:  Dg Chest 2 View  Result Date: 12/23/2017 CLINICAL DATA:  Pneumonia. EXAM: CHEST  2 VIEW COMPARISON:  12/22/2017 FINDINGS: The cardiac silhouette is borderline enlarged. Mild interstitial densities in the right greater than left lungs are unchanged. There is mild pleural thickening or trace pleural fluid along the fissures. Small pleural effusions are noted posteriorly in the lung bases. There is no lobar consolidation or pneumothorax. A calcified granuloma is noted anteriorly in the right lung. No acute osseous abnormality is seen. IMPRESSION: Unchanged mild interstitial densities compatible with edema. Small pleural effusions. Electronically Signed   By: Sebastian AcheAllen  Grady M.D.   On: 12/23/2017 14:05   Dg Chest 2 View  Result Date: 12/22/2017 CLINICAL DATA:  Chest pain with shob x 1 day EXAM: CHEST - 2 VIEW COMPARISON:  CT 12/20/2017 FINDINGS: Central pulmonary vascular  congestion and peripheral interstitial prominence right greater than left suggesting mild interstitial edema. No confluent airspace disease. Heart size and mediastinal contours are within normal limits. Small left pleural effusion. Small amount of fluid in the interlobar fissures. No pneumothorax. Visualized bones unremarkable. IMPRESSION: 1. Mild interstitial edema with small left effusion. Electronically Signed   By: Corlis Leak  Hassell M.D.   On: 12/22/2017 12:18    Assessment & Plan:   Hemolytic anemia The patient has significant polychromasia The high indirect hyperbilirubinemia with elevated LDH and splenomegaly with prior history of cholecystectomy, highly suspicious for autoimmune hemolytic anemia. 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, triggered by an unknown stress factor that causes acute on chronic hemolysis His last set of total bilirubin level is reduced I discussed with him the risk and benefits of blood transfusion and he is undecided I recommend consideration for 2 units of blood transfusion to bring up his hemoglobin due to his symptoms  Thrombocytopenia We do not have baseline platelet count Thrombocytopenia could be related to chronic splenomegaly and related to relative vitamin B12 deficiency He was started on vitamin B12 injection on 12/23/2017  I recommend another dose of vitamin B12 injection today, followed by vitamin B12 supplement tomorrow, daily indefinitely I will also start him on folic acid supplementation daily There is no contraindication to remain on antiplatelet agents or anticoagulants as long as the platelet is greater  than 50,000.  mild elevated creatinine It could be related to recent contrast exposure Recommend aggressive IV fluid resuscitation  Left upper quadrant pain Could be related to splenomegaly or other issues Pain medication as needed  Elevated BNP Will defer to cardiologist  Discharge  planning The patient originally from [place].  He is being admitted here because of recent visit to family.  Most of the test results ordered will not be available until a few more days He would need to follow-up with a hematologist after discharge He can potentially be discharged tomorrow if his symptoms of shortness of breath/anemia resolve    Artis Delay, MD 12/24/2017  7:09 AM

## 2017-12-24 NOTE — Progress Notes (Signed)
Hospitalist progress note   Alejandro Perez  HQP:[id number] DOB: 11/14/1976 DOA: 12/21/2017 PCP: No primary care provider on file.   Specialists:   Brief Narrative:  89 male, intermittent Tob use, Marijuana 2-3 week, sickle cell trait h/o unaccustomed exertional dyspnea + pleuritic CP 2 days--found to have some blood dyscrasias Hemoglobin 9.3 PLT 30  No Schistocytes Creatinine 1.4 [prior nl] BNP 4020 Given pleuritis CP Heparin was started  Assessment & Plan:   Assessment:  The primary encounter diagnosis was Acquired hemolytic anemia ([place]). Diagnoses of Abdominal pain, RUQ, Chest pain, PNA (pneumonia), and Vitamin B12 deficiency were also pertinent to this visit.  Hemolytic anemia-Haptoglobin <10, LDH 351, ESR 10, crp 1.8 DAT neg, hepatitis panel is neg - await fruther LAB work-up--given haptoglobin <10 and bili ~ 6 on admit was elevated-likely hemolytic-Bili 6-->2.9.  Has been cousnelled by Hematology re: PRBC, will follow and transfuse likely 2 U today if allows.  dispo remains issue-will need close monitoring and follow up on d/c--I have asked him to call his PCP in [facility] he is from] and schedule a 3-7 day appt--if he is not able to get an appt I am happy to reach out and explain the circumstances to that doctor Spelnomegally-painful-he is using prn percocet more and Dilaudid as needed-He is aware we cannot let him go home with IV meds and I have encouraged him to ambulate ? Chest pain and abd pain-unlikely Cardiac--would fit possibly with acute chest syndrome?  Rpt CXR showed only minimal fluid-increase Percocet to 2 tab q4 and will dose with Dilaudid-cont metoprolol 12.5 bid for now   DVT prophylaxis: scd  Code Status:   Full    Family Communication:   none  Disposition Plan: inpatient, pednging transfusion and likely d/c home am   Consultants:   Heme  cardiology  Procedures:   nad  Antimicrobials:   none   Subjective:  Awake alert seems comfortable but states has 8/10  pain No cp Still sob with exertion   Objective: Vitals:   12/23/17 0330 12/23/17 1115 12/23/17 2039 12/24/17 0350  BP: (!) 159/91  (!) 160/87 (!) 150/90  Pulse: 74  81 74  Resp: '16  17 11  '$ Temp: 98.9 F (37.2 C) 98.2 F (36.8 C) 97.8 F (36.6 C) 98.6 F (37 C)  TempSrc: Oral Oral Oral Oral  SpO2: 99%  98% 93%  Weight:      Height:        Intake/Output Summary (Last 24 hours) at 12/24/2017 5993 Last data filed at 12/24/2017 0353 Gross per 24 hour  Intake 480 ml  Output 575 ml  Net -95 ml   Filed Weights   12/21/17 0500  Weight: 71.2 kg (156 lb 15.5 oz)    Examination:  No pallor no ict ctab without added sound nor wheeze abd tender in RUQ and Splenomegally noted No rebound no guard no le edema  Data Reviewed: I have personally reviewed following labs and imaging studies  CBC: Recent Labs  Lab 12/21/17 0750 12/22/17 0233 12/23/17 0416 12/24/17 0405  WBC 7.9 7.2 7.6 8.4  NEUTROABS  --  4.6 5.0 5.1  HGB 7.9* 8.1* 7.5* 7.3*  HCT 22.4* 22.4* 21.0* 20.2*  MCV 85.2 86.5 87.1 86.0  PLT 50* 55* 55* 52*   Basic Metabolic Panel: Recent Labs  Lab 12/21/17 0750 12/22/17 0233 12/23/17 0910  NA 135 137 136  K 3.8 4.1 4.0  CL 100* 102 104  CO2 '25 23 23  '$ GLUCOSE 95 81  107*  BUN '13 15 17  '$ CREATININE 1.55* 1.54* 1.74*  CALCIUM 8.9 8.3* 8.6*   GFR: Estimated Creatinine Clearance: 55.1 mL/min (A) (by C-G formula based on SCr of 1.74 mg/dL (H)). Liver Function Tests: Recent Labs  Lab 12/21/17 0750 12/23/17 0910  AST 59* 27  ALT 47 29  ALKPHOS 84 73  BILITOT 6.0* 2.9*  PROT 5.9* 5.9*  ALBUMIN 3.8 3.8   No results for input(s): LIPASE, AMYLASE in the last 168 hours. No results for input(s): AMMONIA in the last 168 hours. Coagulation Profile: Recent Labs  Lab 12/22/17 1149  INR 1.15   Cardiac Enzymes: No results for input(s): CKTOTAL, CKMB, CKMBINDEX, TROPONINI in the last 168 hours. CBG: No results for input(s): GLUCAP in the last 168 hours. Urine  analysis:    Component Value Date/Time   COLORURINE YELLOW 12/21/2017 [place] 12/21/2017 1156   LABSPEC 1.010 12/21/2017 1156   PHURINE 7.0 12/21/2017 1156   GLUCOSEU NEGATIVE 12/21/2017 1156   HGBUR TRACE (A) 12/21/2017 1156   BILIRUBINUR SMALL (A) 12/21/2017 1156   KETONESUR NEGATIVE 12/21/2017 1156   PROTEINUR NEGATIVE 12/21/2017 1156   NITRITE NEGATIVE 12/21/2017 [place] 12/21/2017 1156     Radiology Studies: Reviewed images personally in health database    Scheduled Meds: . cyanocobalamin  1,000 mcg Intramuscular Once  . folic acid  1 mg Oral Daily  . metoprolol tartrate  25 mg Oral BID  . [START ON 12/25/2017] vitamin B-12  1,000 mcg Oral Daily   Continuous Infusions:    LOS: 3 days    Time spent: [place], MD [facility] ([facility]   If 7PM-7AM, please contact night-coverage [email or web address] Password [email or web address] 12/24/2017, 9:22 AM

## 2017-12-24 NOTE — Progress Notes (Signed)
Progress Note  Patient Name: Alejandro Perez Date of Encounter: 12/24/2017  Primary Cardiologist: No primary care provider on file.   Subjective   Still has intermittent sharp pleuritic CP and mild SOB  Inpatient Medications    Scheduled Meds: . acetaminophen  650 mg Oral Once  . cyanocobalamin  1,000 mcg Intramuscular Once  . diphenhydrAMINE  25 mg Oral Once  . folic acid  1 mg Oral Daily  . furosemide  20 mg Intravenous Once  . metoprolol tartrate  25 mg Oral BID  . [START ON 12/25/2017] vitamin B-12  1,000 mcg Oral Daily   Continuous Infusions: . sodium chloride     PRN Meds: acetaminophen, gi cocktail, HYDROmorphone (DILAUDID) injection, ondansetron (ZOFRAN) IV, oxyCODONE-acetaminophen   Vital Signs    Vitals:   12/23/17 0330 12/23/17 1115 12/23/17 2039 12/24/17 0350  BP: (!) 159/91  (!) 160/87 (!) 150/90  Pulse: 74  81 74  Resp: 16  17 11   Temp: 98.9 F (37.2 C) 98.2 F (36.8 C) 97.8 F (36.6 C) 98.6 F (37 C)  TempSrc: Oral Oral Oral Oral  SpO2: 99%  98% 93%  Weight:      Height:        Intake/Output Summary (Last 24 hours) at 12/24/2017 1004 Last data filed at 12/24/2017 0353 Gross per 24 hour  Intake 480 ml  Output 575 ml  Net -95 ml   Filed Weights   12/21/17 0500  Weight: 156 lb 15.5 oz (71.2 kg)    Telemetry    NSR - Personally Reviewed  ECG    No new EKG to review - Personally Reviewed  Physical Exam   GEN: No acute distress.   Neck: No JVD Cardiac: RRR, no murmurs, rubs, or gallops.  Respiratory: Clear to auscultation bilaterally. GI: Soft, nontender, non-distended  MS: No edema; No deformity. Neuro:  Nonfocal  Psych: Normal affect   Labs    Chemistry Recent Labs  Lab 12/21/17 0750 12/22/17 0233 12/23/17 0910  NA 135 137 136  K 3.8 4.1 4.0  CL 100* 102 104  CO2 25 23 23   GLUCOSE 95 81 107*  BUN 13 15 17   CREATININE 1.55* 1.54* 1.74*  CALCIUM 8.9 8.3* 8.6*  PROT 5.9*  --  5.9*  ALBUMIN 3.8  --  3.8  AST 59*  --  27    ALT 47  --  29  ALKPHOS 84  --  73  BILITOT 6.0*  --  2.9*  GFRNONAA 53* 54* 46*  GFRAA >60 >60 54*  ANIONGAP 10 12 9      Hematology Recent Labs  Lab 12/22/17 0233 12/23/17 0416 12/24/17 0405  WBC 7.2 7.6 8.4  RBC 2.59* 2.41* 2.35*  HGB 8.1* 7.5* 7.3*  HCT 22.4* 21.0* 20.2*  MCV 86.5 87.1 86.0  MCH 31.3 31.1 31.1  MCHC 36.2* 35.7 36.1*  RDW 24.7* 25.0* 24.5*  PLT 55* 55* 52*    Cardiac EnzymesNo results for input(s): TROPONINI in the last 168 hours. No results for input(s): TROPIPOC in the last 168 hours.   BNP Recent Labs  Lab 12/21/17 0806  BNP 819.1*     DDimer No results for input(s): DDIMER in the last 168 hours.   Radiology    Dg Chest 2 View  Result Date: 12/23/2017 CLINICAL DATA:  Pneumonia. EXAM: CHEST  2 VIEW COMPARISON:  12/22/2017 FINDINGS: The cardiac silhouette is borderline enlarged. Mild interstitial densities in the right greater than left lungs are unchanged. There is mild  pleural thickening or trace pleural fluid along the fissures. Small pleural effusions are noted posteriorly in the lung bases. There is no lobar consolidation or pneumothorax. A calcified granuloma is noted anteriorly in the right lung. No acute osseous abnormality is seen. IMPRESSION: Unchanged mild interstitial densities compatible with edema. Small pleural effusions. Electronically Signed   By: Sebastian Ache M.D.   On: 12/23/2017 14:05   Dg Chest 2 View  Result Date: 12/22/2017 CLINICAL DATA:  Chest pain with shob x 1 day EXAM: CHEST - 2 VIEW COMPARISON:  CT 12/20/2017 FINDINGS: Central pulmonary vascular congestion and peripheral interstitial prominence right greater than left suggesting mild interstitial edema. No confluent airspace disease. Heart size and mediastinal contours are within normal limits. Small left pleural effusion. Small amount of fluid in the interlobar fissures. No pneumothorax. Visualized bones unremarkable. IMPRESSION: 1. Mild interstitial edema with small left  effusion. Electronically Signed   By: Corlis Leak M.D.   On: 12/22/2017 12:18    Cardiac Studies  2D echo Study Conclusions  - Left ventricle: The cavity size was normal. There was mild concentric hypertrophy. Systolic function was normal. The estimated ejection fraction was in the range of 60% to 65%. Wall motion was normal; there were no regional wall motion abnormalities. Features are consistent with a pseudonormal left ventricular filling pattern, with concomitant abnormal relaxation and increased filling pressure (grade 2 diastolic dysfunction). - Aortic valve: There was moderate regurgitation. - Mitral valve: There was mild regurgitation directed centrally. - Left atrium: The atrium was mildly dilated. - Right ventricle: The cavity size was mildly dilated. - Right atrium: The atrium was mildly dilated. - Pulmonary arteries: Systolic pressure was moderately increased. PA peak pressure: 63 mm Hg (S).    Patient Profile     42 y.o. male with a hx of HTN,glaucoma since the age of 10,intermittent tobacco and mariajuana use and family h/osicklecellanemiatrait(mother), transferred from [facility] for evaluation for chest pain + hematologic w/u (concerns for TTP)who is being seen for the evaluation of chest pain,at the request of Dr. Luberta Robertson, Internal Medicine  Assessment & Plan    CHEST PAIN:    Felt to be non anginal.   - EKG with LVH and repol abnormality - CP pleuritic with Chest CT neg for PE and echo with no pericardial effusion - trop neg x 2 at outside hospital - No further ischemia work up.    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   - would start on Lasix 20mg  daily  Aortic insuff - Moderate on echo.   - this will need to be followed with yearly echo  HEMOLYTIC ANEMIA:   - Heme work up is underway.    No other recs at this time.  Continue to titrate amlodipine as needed for BP control.  We will see him as outpt.  For questions or updates, please contact [facility] Please consult [email or web address] for contact info under Cardiology/STEMI.      Signed, Armanda Magic, MD  12/24/2017, 10:04  AM

## 2017-12-25 LAB — BPAM RBC
BLOOD PRODUCT EXPIRATION DATE: 201902041752
BLOOD PRODUCT EXPIRATION DATE: 201902142359
ISSUE DATE / TIME: 201902041549
ISSUE DATE / TIME: 201902041752
UNIT TYPE AND RH: 6200
Unit Type and Rh: 6200

## 2017-12-25 LAB — TYPE AND SCREEN
ABO/RH(D): A POS
Antibody Screen: NEGATIVE
UNIT DIVISION: 0
Unit division: 0

## 2017-12-25 LAB — CBC WITH DIFFERENTIAL/PLATELET
BASOS PCT: 1 %
Basophils Absolute: 0.1 10*3/uL (ref 0.0–0.1)
EOS ABS: 0.6 10*3/uL (ref 0.0–0.7)
Eosinophils Relative: 7 %
HCT: 25.1 % — ABNORMAL LOW (ref 39.0–52.0)
HEMOGLOBIN: 8.9 g/dL — AB (ref 13.0–17.0)
Lymphocytes Relative: 18 %
Lymphs Abs: 1.6 10*3/uL (ref 0.7–4.0)
MCH: 30.4 pg (ref 26.0–34.0)
MCHC: 35.5 g/dL (ref 30.0–36.0)
MCV: 85.7 fL (ref 78.0–100.0)
MONO ABS: 0.6 10*3/uL (ref 0.1–1.0)
Monocytes Relative: 7 %
NEUTROS ABS: 5.8 10*3/uL (ref 1.7–7.7)
Neutrophils Relative %: 67 %
Platelets: 49 10*3/uL — ABNORMAL LOW (ref 150–400)
RBC: 2.93 MIL/uL — ABNORMAL LOW (ref 4.22–5.81)
RDW: 21.7 % — ABNORMAL HIGH (ref 11.5–15.5)
WBC: 8.7 10*3/uL (ref 4.0–10.5)

## 2017-12-25 LAB — METHYLMALONIC ACID, SERUM: METHYLMALONIC ACID, QUANTITATIVE: 253 nmol/L (ref 0–378)

## 2017-12-25 MED ORDER — OXYCODONE-ACETAMINOPHEN 7.5-325 MG PO TABS: 2.0000 | ORAL_TABLET | ORAL | 0 refills | Status: DC | PRN

## 2017-12-25 MED ORDER — AMLODIPINE BESYLATE 2.5 MG PO TABS: 2.5000 mg | ORAL_TABLET | Freq: Every day | ORAL | 0 refills | Status: DC

## 2017-12-25 MED ORDER — METOPROLOL TARTRATE 25 MG PO TABS: 25.0000 mg | ORAL_TABLET | Freq: Two times a day (BID) | ORAL | 0 refills | Status: DC

## 2017-12-25 MED ORDER — CYANOCOBALAMIN 1000 MCG PO TABS: 1000.0000 ug | ORAL_TABLET | Freq: Every day | ORAL | 0 refills | Status: AC

## 2017-12-25 MED ORDER — FOLIC ACID 1 MG PO TABS: 1.0000 mg | ORAL_TABLET | Freq: Every day | ORAL | 0 refills | Status: AC

## 2017-12-25 NOTE — Progress Notes (Signed)
Patient reports increased abdominal pain overnight. Said his stomach hurt worse and he did not want to take PO pain medication. Required IV Dilaudid.for pain control. Patient is concerned about the cause of his pain.

## 2017-12-25 NOTE — Discharge Summary (Signed)
Physician Discharge Summary  Alejandro Perez EHO:[id number] DOB: 1976/03/10 DOA: 12/21/2017  PCP: No primary care provider on file.  Admit date: 12/21/2017 Discharge date: 12/25/2017  Time spent: 20 minutes  Recommendations for Outpatient Follow-up:  1. Patient had workup for hemoglobinopathy and hemoglobin n electrophoresis was negative on this admission-please follow the following labs when patient is discharged-methylmalonic acid, cryoglobulin--- it was felt that patient's use of marijuana might have caused some of the symptoms that he had because of contaminants and I suspect his splenomegaly will resolve going forward 2. Patient was given a limited prescription for Percocet and will need refills at his outpatient physician  3. 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[facility])   Abdominal pain, RUQ   Vitamin B12 deficiency   Pulmonary HTN ([place])   Discharge Condition: Improved  Diet recommendation: Heart healthy  Filed Weights   12/21/17 0500  Weight: 71.2 kg (156 lb 15.5 oz)    History of present illness:  23 male, intermittent Tob use, Marijuana 2-3 week, sickle cell trait h/o unaccustomed exertional dyspnea + pleuritic CP 2 days--found to have some blood dyscrasias Hemoglobin 9.3 PLT 30  No Schistocytes Creatinine 1.4 [prior nl] BNP 4020 Given pleuritis CP Heparin was started    Hospital Course:  Hemolytic anemia-Haptoglobin <10, LDH 351, ESR 10, crp 1.8 DAT neg, hepatitis panel is neg - --given haptoglobin <10 and bili ~ 6 on admit was elevated-likely hemolytic-Bili 6-->2.9.  Has been cousnelled by Hematology re: PRBC,-2 units packed red blood cells transfused 2/4 and patient felt much better The following labs were done and were negative  homocystine, cold agglutinin titer, Adams TS13 activity, hepatitis acute panel, hemoglobinopathy panel, direct antiglobulin, antinuclear antibody  Spelnomegally-painful-he is using prn percocet more and Dilaudid as needed-He is aware we cannot let him go home with IV meds and I have encouraged him to ambulate  ? Chest pain and abd pain-unlikely Cardiac--would fit possibly with acute chest syndrome?  Rpt CXR showed only minimal fluid-increase Percocet to 2 tab q4 and will dose with Dilaudid-cont metoprolol 12.5 bid for now    Procedures: None Consultations:  Hematology Dr. Heath Lark  Cardiology  Discharge Exam: Vitals:   12/24/17 1952 12/25/17 0448  BP: (!) 179/97 (!) 164/106  Pulse: 77 76  Resp:  12  Temp: 98.6 F (37 C) 99.1 F (37.3 C)  SpO2: 98% 97%    General: Awake alert pleasant in no distress looks less short winded and feels stronger Cardiovascular: S1-S2 no murmur rub or gallop Respiratory: Clinically clear no added sounds Abdomen soft slightly tender in right upper quadrant no rebound no guarding no lower extremity edema  Discharge Instructions   Discharge Instructions    Diet - low sodium heart healthy   Complete by:  As directed    Discharge instructions   Complete by:  As directed    Please note that your blood pressure is elevated and we have started some new medications for you which you will need to take you will also be on some blood builder medications such as folic acid and vitamin B12 and you will have to call medical records at [facility] to get a copy of what ever was done for you in the hospital I  would recommend that you take your Percocet sparingly however if he needed take 1 tablet every 4 hours scheduled for the next 2-3 days but use Tylenol as a first choice I would dissuade you from using any over-the-counter nonsteroidal medications at this time such as Motrin or ibuprofen Please make sure that you get all of your labs followed up and  you will have to contact the lab and get the reports faxed to your regular doctor   Increase activity slowly   Complete by:  As directed      Allergies as of 12/25/2017      Reactions   Morphine And Related Other (See Comments)   Severe abdominal pain      Medication List    TAKE these medications   amLODipine 2.5 MG tablet Commonly known as:  NORVASC Take 1 tablet (2.5 mg total) by mouth daily.   cloNIDine 0.1 MG tablet Commonly known as:  CATAPRES Take 0.1 mg by mouth 2 (two) times daily.   cyanocobalamin 1000 MCG tablet Take 1 tablet (1,000 mcg total) by mouth daily.   folic acid 1 MG tablet Commonly known as:  FOLVITE Take 1 tablet (1 mg total) by mouth daily.   metoprolol tartrate 25 MG tablet Commonly known as:  LOPRESSOR Take 1 tablet (25 mg total) by mouth 2 (two) times daily.   oxyCODONE-acetaminophen 7.5-325 MG tablet Commonly known as:  PERCOCET Take 2 tablets by mouth every 4 (four) hours as needed for moderate pain.      Allergies  Allergen Reactions  . Morphine And Related Other (See Comments)    Severe abdominal pain      The results of significant diagnostics from this hospitalization (including imaging, microbiology, ancillary and laboratory) are listed below for reference.    Significant Diagnostic Studies: Dg Chest 2 View  Result Date: 12/23/2017 CLINICAL DATA:  Pneumonia. EXAM: CHEST  2 VIEW COMPARISON:  12/22/2017 FINDINGS: The cardiac silhouette is borderline enlarged. Mild interstitial densities in the right greater than left lungs are unchanged. There is mild pleural thickening or trace pleural fluid along the fissures. Small pleural effusions are noted posteriorly in the lung bases. There is no lobar consolidation or pneumothorax. A calcified granuloma is noted anteriorly in the right lung. No acute osseous abnormality is seen. IMPRESSION: Unchanged mild interstitial densities compatible with edema. Small pleural effusions. Electronically  Signed   By: Logan Bores M.D.   On: 12/23/2017 14:05   Dg Chest 2 View  Result Date: 12/22/2017 CLINICAL DATA:  Chest pain with shob x 1 day EXAM: CHEST - 2 VIEW COMPARISON:  CT 12/20/2017 FINDINGS: Central pulmonary vascular congestion and peripheral interstitial prominence right greater than left suggesting mild interstitial edema. No confluent airspace disease. Heart size and mediastinal contours are within normal limits. Small left pleural effusion. Small amount of fluid in the interlobar fissures. No pneumothorax. Visualized bones unremarkable. IMPRESSION: 1. Mild interstitial edema with small left effusion. Electronically Signed   By: Lucrezia Europe M.D.   On: 12/22/2017 12:18   US Abdomen Complete  Result Date: 12/21/2017 CLINICAL DATA:  Right upper quadrant abdominal pain for 2 days. Prior cholecystectomy. EXAM: ABDOMEN ULTRASOUND COMPLETE COMPARISON:  CT on 12/20/2017 FINDINGS: Gallbladder: Surgically absent. Common bile duct: Diameter: 9 mm, within normal limits status post cholecystectomy. Liver: No focal lesion identified. Within normal limits in parenchymal echogenicity. Portal vein is patent on color Doppler imaging with normal direction of blood flow towards the liver. IVC: No abnormality visualized. Pancreas: Visualized  portion unremarkable. Spleen: Splenomegaly, with length measuring approximately 15 cm and calculated volume of 1000 cc. No splenic masses identified. Right Kidney: Length: 8.9 cm. Echogenicity within normal limits. No mass or hydronephrosis visualized. Left Kidney: Length: 10.2 cm. Echogenicity within normal limits. No mass or hydronephrosis visualized. Abdominal aorta: No aneurysm visualized. Other findings: Small right pleural effusion. IMPRESSION: Prior cholecystectomy.  No evidence of biliary ductal dilatation. Moderate splenomegaly. Small right pleural effusion. Electronically Signed   By: Earle Gell M.D.   On: 12/21/2017 15:12    Microbiology: No results found for this  or any previous visit (from the past 240 hour(s)).   Labs: Basic Metabolic Panel: Recent Labs  Lab 12/21/17 0750 12/22/17 0233 12/23/17 0910  NA 135 137 136  K 3.8 4.1 4.0  CL 100* 102 104  CO2 _0 GLUCOSE 95 81 107*  BUN _1 CREATININE 1.55* 1.54* 1.74*  CALCIUM 8.9 8.3* 8.6*   Liver Function Tests: Recent Labs  Lab 12/21/17 0750 12/23/17 0910  AST 59* 27  ALT 47 29  ALKPHOS 84 73  BILITOT 6.0* 2.9*  PROT 5.9* 5.9*  ALBUMIN 3.8 3.8   No results for input(s): LIPASE, AMYLASE in the last 168 hours. No results for input(s): AMMONIA in the last 168 hours. CBC: Recent Labs  Lab 12/21/17 0750 12/22/17 0233 12/23/17 0416 12/24/17 0405 12/25/17 0329  WBC 7.9 7.2 7.6 8.4 8.7  NEUTROABS  --  4.6 5.0 5.1 5.8  HGB 7.9* 8.1* 7.5* 7.3* 8.9*  HCT 22.4* 22.4* 21.0* 20.2* 25.1*  MCV 85.2 86.5 87.1 86.0 85.7  PLT 50* 55* 55* 52* 49*   Cardiac Enzymes: No results for input(s): CKTOTAL, CKMB, CKMBINDEX, TROPONINI in the last 168 hours. BNP: BNP (last 3 results) Recent Labs    12/21/17 0806  BNP 819.1*    ProBNP (last 3 results) No results for input(s): PROBNP in the last 8760 hours.  CBG: No results for input(s): GLUCAP in the last 168 hours.     Signed:  Nita Sells MD   [facility] 12/25/2017, 10:26 AM

## 2017-12-27 LAB — CRYOGLOBULIN

## 2018-04-11 DIAGNOSIS — K8050 Calculus of bile duct without cholangitis or cholecystitis without obstruction: Secondary | ICD-10-CM

## 2018-04-11 DIAGNOSIS — D649 Anemia, unspecified: Secondary | ICD-10-CM

## 2018-04-11 DIAGNOSIS — D696 Thrombocytopenia, unspecified: Secondary | ICD-10-CM

## 2018-04-11 DIAGNOSIS — R109 Unspecified abdominal pain: Secondary | ICD-10-CM

## 2018-04-12 DIAGNOSIS — D696 Thrombocytopenia, unspecified: Secondary | ICD-10-CM | POA: Diagnosis not present

## 2018-04-12 DIAGNOSIS — R109 Unspecified abdominal pain: Secondary | ICD-10-CM | POA: Diagnosis not present

## 2018-04-12 DIAGNOSIS — D649 Anemia, unspecified: Secondary | ICD-10-CM | POA: Diagnosis not present

## 2019-06-03 DIAGNOSIS — J189 Pneumonia, unspecified organism: Secondary | ICD-10-CM | POA: Diagnosis not present

## 2019-06-03 DIAGNOSIS — D573 Sickle-cell trait: Secondary | ICD-10-CM | POA: Diagnosis not present

## 2019-06-03 DIAGNOSIS — I10 Essential (primary) hypertension: Secondary | ICD-10-CM | POA: Diagnosis not present

## 2019-06-03 DIAGNOSIS — I361 Nonrheumatic tricuspid (valve) insufficiency: Secondary | ICD-10-CM

## 2019-06-03 DIAGNOSIS — R079 Chest pain, unspecified: Secondary | ICD-10-CM

## 2019-06-03 DIAGNOSIS — I340 Nonrheumatic mitral (valve) insufficiency: Secondary | ICD-10-CM

## 2019-06-03 DIAGNOSIS — I351 Nonrheumatic aortic (valve) insufficiency: Secondary | ICD-10-CM

## 2019-06-04 DIAGNOSIS — R079 Chest pain, unspecified: Secondary | ICD-10-CM

## 2019-06-04 DIAGNOSIS — J189 Pneumonia, unspecified organism: Secondary | ICD-10-CM

## 2019-06-04 DIAGNOSIS — D696 Thrombocytopenia, unspecified: Secondary | ICD-10-CM

## 2019-06-04 DIAGNOSIS — D649 Anemia, unspecified: Secondary | ICD-10-CM

## 2019-06-04 DIAGNOSIS — I10 Essential (primary) hypertension: Secondary | ICD-10-CM

## 2019-06-04 DIAGNOSIS — D573 Sickle-cell trait: Secondary | ICD-10-CM

## 2019-06-05 DIAGNOSIS — I10 Essential (primary) hypertension: Secondary | ICD-10-CM | POA: Diagnosis not present

## 2019-06-05 DIAGNOSIS — R079 Chest pain, unspecified: Secondary | ICD-10-CM | POA: Diagnosis not present

## 2019-06-05 DIAGNOSIS — D573 Sickle-cell trait: Secondary | ICD-10-CM | POA: Diagnosis not present

## 2019-06-05 DIAGNOSIS — J189 Pneumonia, unspecified organism: Secondary | ICD-10-CM | POA: Diagnosis not present

## 2019-06-06 DIAGNOSIS — J189 Pneumonia, unspecified organism: Secondary | ICD-10-CM | POA: Diagnosis not present

## 2019-06-06 DIAGNOSIS — D573 Sickle-cell trait: Secondary | ICD-10-CM | POA: Diagnosis not present

## 2019-06-06 DIAGNOSIS — I10 Essential (primary) hypertension: Secondary | ICD-10-CM | POA: Diagnosis not present

## 2019-06-06 DIAGNOSIS — R079 Chest pain, unspecified: Secondary | ICD-10-CM | POA: Diagnosis not present

## 2019-06-07 DIAGNOSIS — I10 Essential (primary) hypertension: Secondary | ICD-10-CM | POA: Diagnosis not present

## 2019-06-07 DIAGNOSIS — D571 Sickle-cell disease without crisis: Secondary | ICD-10-CM

## 2019-06-07 DIAGNOSIS — D573 Sickle-cell trait: Secondary | ICD-10-CM | POA: Diagnosis not present

## 2019-06-07 DIAGNOSIS — R161 Splenomegaly, not elsewhere classified: Secondary | ICD-10-CM

## 2019-06-07 DIAGNOSIS — R079 Chest pain, unspecified: Secondary | ICD-10-CM | POA: Diagnosis not present

## 2019-06-07 DIAGNOSIS — J189 Pneumonia, unspecified organism: Secondary | ICD-10-CM | POA: Diagnosis not present

## 2019-06-08 DIAGNOSIS — D573 Sickle-cell trait: Secondary | ICD-10-CM | POA: Diagnosis not present

## 2019-06-08 DIAGNOSIS — R079 Chest pain, unspecified: Secondary | ICD-10-CM

## 2019-06-08 DIAGNOSIS — I10 Essential (primary) hypertension: Secondary | ICD-10-CM | POA: Diagnosis not present

## 2019-06-08 DIAGNOSIS — J189 Pneumonia, unspecified organism: Secondary | ICD-10-CM | POA: Diagnosis not present

## 2019-06-09 DIAGNOSIS — J189 Pneumonia, unspecified organism: Secondary | ICD-10-CM | POA: Diagnosis not present

## 2019-06-09 DIAGNOSIS — R079 Chest pain, unspecified: Secondary | ICD-10-CM | POA: Diagnosis not present

## 2019-06-09 DIAGNOSIS — I10 Essential (primary) hypertension: Secondary | ICD-10-CM | POA: Diagnosis not present

## 2019-06-09 DIAGNOSIS — D573 Sickle-cell trait: Secondary | ICD-10-CM | POA: Diagnosis not present

## 2019-06-10 DIAGNOSIS — D5700 Hb-SS disease with crisis, unspecified: Secondary | ICD-10-CM | POA: Diagnosis not present

## 2019-06-10 DIAGNOSIS — J189 Pneumonia, unspecified organism: Secondary | ICD-10-CM | POA: Diagnosis not present

## 2019-06-10 DIAGNOSIS — D573 Sickle-cell trait: Secondary | ICD-10-CM | POA: Diagnosis not present

## 2019-06-10 DIAGNOSIS — R079 Chest pain, unspecified: Secondary | ICD-10-CM | POA: Diagnosis not present

## 2019-06-10 DIAGNOSIS — I10 Essential (primary) hypertension: Secondary | ICD-10-CM | POA: Diagnosis not present

## 2019-06-11 DIAGNOSIS — I10 Essential (primary) hypertension: Secondary | ICD-10-CM | POA: Diagnosis not present

## 2019-06-11 DIAGNOSIS — D599 Acquired hemolytic anemia, unspecified: Secondary | ICD-10-CM

## 2019-06-11 DIAGNOSIS — J189 Pneumonia, unspecified organism: Secondary | ICD-10-CM | POA: Diagnosis not present

## 2019-06-11 DIAGNOSIS — D573 Sickle-cell trait: Secondary | ICD-10-CM | POA: Diagnosis not present

## 2019-06-11 DIAGNOSIS — R079 Chest pain, unspecified: Secondary | ICD-10-CM | POA: Diagnosis not present

## 2019-06-12 DIAGNOSIS — D599 Acquired hemolytic anemia, unspecified: Secondary | ICD-10-CM

## 2019-06-12 DIAGNOSIS — I10 Essential (primary) hypertension: Secondary | ICD-10-CM | POA: Diagnosis not present

## 2019-06-12 DIAGNOSIS — R079 Chest pain, unspecified: Secondary | ICD-10-CM | POA: Diagnosis not present

## 2019-06-12 DIAGNOSIS — D573 Sickle-cell trait: Secondary | ICD-10-CM | POA: Diagnosis not present

## 2019-06-12 DIAGNOSIS — J189 Pneumonia, unspecified organism: Secondary | ICD-10-CM | POA: Diagnosis not present

## 2019-06-13 DIAGNOSIS — R079 Chest pain, unspecified: Secondary | ICD-10-CM | POA: Diagnosis not present

## 2019-06-13 DIAGNOSIS — J189 Pneumonia, unspecified organism: Secondary | ICD-10-CM | POA: Diagnosis not present

## 2019-06-13 DIAGNOSIS — D599 Acquired hemolytic anemia, unspecified: Secondary | ICD-10-CM

## 2019-06-13 DIAGNOSIS — D580 Hereditary spherocytosis: Secondary | ICD-10-CM

## 2019-06-13 DIAGNOSIS — I10 Essential (primary) hypertension: Secondary | ICD-10-CM | POA: Diagnosis not present

## 2019-06-13 DIAGNOSIS — D573 Sickle-cell trait: Secondary | ICD-10-CM | POA: Diagnosis not present

## 2019-06-14 DIAGNOSIS — D580 Hereditary spherocytosis: Secondary | ICD-10-CM

## 2019-06-14 DIAGNOSIS — J189 Pneumonia, unspecified organism: Secondary | ICD-10-CM | POA: Diagnosis not present

## 2019-06-14 DIAGNOSIS — R079 Chest pain, unspecified: Secondary | ICD-10-CM | POA: Diagnosis not present

## 2019-06-14 DIAGNOSIS — I10 Essential (primary) hypertension: Secondary | ICD-10-CM | POA: Diagnosis not present

## 2019-06-14 DIAGNOSIS — D573 Sickle-cell trait: Secondary | ICD-10-CM | POA: Diagnosis not present

## 2019-10-03 DIAGNOSIS — D580 Hereditary spherocytosis: Secondary | ICD-10-CM | POA: Diagnosis not present

## 2019-10-05 DIAGNOSIS — I160 Hypertensive urgency: Secondary | ICD-10-CM

## 2019-10-06 DIAGNOSIS — D580 Hereditary spherocytosis: Secondary | ICD-10-CM | POA: Diagnosis not present

## 2019-10-06 DIAGNOSIS — I272 Pulmonary hypertension, unspecified: Secondary | ICD-10-CM

## 2019-10-08 ENCOUNTER — Inpatient Hospital Stay (HOSPITAL_COMMUNITY)
Admission: AD | Admit: 2019-10-08 | Discharge: 2019-10-18 | DRG: 799 | Disposition: A | Discharge status: Home / Self Care | Payer: Self-pay | Source: Other Acute Inpatient Hospital | Attending: Internal Medicine | Admitting: Internal Medicine

## 2019-10-08 DIAGNOSIS — R718 Other abnormality of red blood cells: Secondary | ICD-10-CM | POA: Diagnosis present

## 2019-10-08 DIAGNOSIS — R0602 Shortness of breath: Secondary | ICD-10-CM

## 2019-10-08 DIAGNOSIS — D638 Anemia in other chronic diseases classified elsewhere: Secondary | ICD-10-CM | POA: Diagnosis present

## 2019-10-08 DIAGNOSIS — R52 Pain, unspecified: Secondary | ICD-10-CM

## 2019-10-08 DIAGNOSIS — Z9081 Acquired absence of spleen: Secondary | ICD-10-CM

## 2019-10-08 DIAGNOSIS — I10 Essential (primary) hypertension: Secondary | ICD-10-CM

## 2019-10-08 DIAGNOSIS — Z885 Allergy status to narcotic agent status: Secondary | ICD-10-CM

## 2019-10-08 DIAGNOSIS — R110 Nausea: Secondary | ICD-10-CM

## 2019-10-08 DIAGNOSIS — R079 Chest pain, unspecified: Secondary | ICD-10-CM

## 2019-10-08 DIAGNOSIS — R161 Splenomegaly, not elsewhere classified: Secondary | ICD-10-CM

## 2019-10-08 DIAGNOSIS — I130 Hypertensive heart and chronic kidney disease with heart failure and stage 1 through stage 4 chronic kidney disease, or unspecified chronic kidney disease: Secondary | ICD-10-CM | POA: Diagnosis present

## 2019-10-08 DIAGNOSIS — Z87891 Personal history of nicotine dependence: Secondary | ICD-10-CM

## 2019-10-08 DIAGNOSIS — D649 Anemia, unspecified: Secondary | ICD-10-CM | POA: Diagnosis present

## 2019-10-08 DIAGNOSIS — K5900 Constipation, unspecified: Secondary | ICD-10-CM | POA: Diagnosis present

## 2019-10-08 DIAGNOSIS — B370 Candidal stomatitis: Secondary | ICD-10-CM | POA: Diagnosis present

## 2019-10-08 DIAGNOSIS — R042 Hemoptysis: Secondary | ICD-10-CM | POA: Diagnosis present

## 2019-10-08 DIAGNOSIS — M7918 Myalgia, other site: Secondary | ICD-10-CM | POA: Diagnosis not present

## 2019-10-08 DIAGNOSIS — Z20828 Contact with and (suspected) exposure to other viral communicable diseases: Secondary | ICD-10-CM | POA: Diagnosis present

## 2019-10-08 DIAGNOSIS — D696 Thrombocytopenia, unspecified: Secondary | ICD-10-CM

## 2019-10-08 DIAGNOSIS — R1011 Right upper quadrant pain: Secondary | ICD-10-CM

## 2019-10-08 DIAGNOSIS — D580 Hereditary spherocytosis: Principal | ICD-10-CM

## 2019-10-08 DIAGNOSIS — Z79899 Other long term (current) drug therapy: Secondary | ICD-10-CM

## 2019-10-08 DIAGNOSIS — Z832 Family history of diseases of the blood and blood-forming organs and certain disorders involving the immune mechanism: Secondary | ICD-10-CM

## 2019-10-08 DIAGNOSIS — J690 Pneumonitis due to inhalation of food and vomit: Secondary | ICD-10-CM | POA: Diagnosis not present

## 2019-10-08 DIAGNOSIS — K660 Peritoneal adhesions (postprocedural) (postinfection): Secondary | ICD-10-CM | POA: Diagnosis present

## 2019-10-08 DIAGNOSIS — I5031 Acute diastolic (congestive) heart failure: Secondary | ICD-10-CM | POA: Diagnosis not present

## 2019-10-08 DIAGNOSIS — E538 Deficiency of other specified B group vitamins: Secondary | ICD-10-CM

## 2019-10-08 DIAGNOSIS — I272 Pulmonary hypertension, unspecified: Secondary | ICD-10-CM

## 2019-10-08 DIAGNOSIS — K219 Gastro-esophageal reflux disease without esophagitis: Secondary | ICD-10-CM | POA: Diagnosis present

## 2019-10-08 DIAGNOSIS — N1831 Chronic kidney disease, stage 3a: Secondary | ICD-10-CM | POA: Diagnosis present

## 2019-10-08 DIAGNOSIS — D599 Acquired hemolytic anemia, unspecified: Secondary | ICD-10-CM

## 2019-10-08 LAB — URINALYSIS, ROUTINE W REFLEX MICROSCOPIC
Bilirubin Urine: NEGATIVE
Glucose, UA: NEGATIVE mg/dL
Hgb urine dipstick: NEGATIVE
Ketones, ur: NEGATIVE mg/dL
Leukocytes,Ua: NEGATIVE
Nitrite: NEGATIVE
Protein, ur: NEGATIVE mg/dL
Specific Gravity, Urine: 1.013 (ref 1.005–1.030)
pH: 6 (ref 5.0–8.0)

## 2019-10-08 LAB — COMPREHENSIVE METABOLIC PANEL
ALT: 17 U/L (ref 0–44)
AST: 18 U/L (ref 15–41)
Albumin: 4.1 g/dL (ref 3.5–5.0)
Alkaline Phosphatase: 47 U/L (ref 38–126)
Anion gap: 11 (ref 5–15)
BUN: 19 mg/dL (ref 6–20)
CO2: 29 mmol/L (ref 22–32)
Calcium: 9.4 mg/dL (ref 8.9–10.3)
Chloride: 98 mmol/L (ref 98–111)
Creatinine, Ser: 1.4 mg/dL — ABNORMAL HIGH (ref 0.61–1.24)
GFR calc Af Amer: >60 mL/min (ref >60)
GFR calc non Af Amer: >60 mL/min (ref >60)
Glucose, Bld: 118 mg/dL — ABNORMAL HIGH (ref 70–99)
Potassium: 3.9 mmol/L (ref 3.5–5.1)
Sodium: 138 mmol/L (ref 135–145)
Total Bilirubin: 2.8 mg/dL — ABNORMAL HIGH (ref 0.3–1.2)
Total Protein: 5.9 g/dL — ABNORMAL LOW (ref 6.5–8.1)

## 2019-10-08 LAB — CBC WITH DIFFERENTIAL/PLATELET
Abs Immature Granulocytes: 0.1 10*3/uL — ABNORMAL HIGH (ref 0.00–0.07)
Basophils Absolute: 0 10*3/uL (ref 0.0–0.1)
Basophils Relative: 0 %
Eosinophils Absolute: 0 10*3/uL (ref 0.0–0.5)
Eosinophils Relative: 0 %
HCT: 30.8 % — ABNORMAL LOW (ref 39.0–52.0)
Hemoglobin: 10.6 g/dL — ABNORMAL LOW (ref 13.0–17.0)
Immature Granulocytes: 1 %
Lymphocytes Relative: 6 %
Lymphs Abs: 0.7 10*3/uL (ref 0.7–4.0)
MCH: 31.2 pg (ref 26.0–34.0)
MCHC: 34.4 g/dL (ref 30.0–36.0)
MCV: 90.6 fL (ref 80.0–100.0)
Monocytes Absolute: 1 10*3/uL (ref 0.1–1.0)
Monocytes Relative: 9 %
Neutro Abs: 9.5 10*3/uL — ABNORMAL HIGH (ref 1.7–7.7)
Neutrophils Relative %: 84 %
Platelets: 86 10*3/uL — ABNORMAL LOW (ref 150–400)
RBC: 3.4 MIL/uL — ABNORMAL LOW (ref 4.22–5.81)
RDW: 22.4 % — ABNORMAL HIGH (ref 11.5–15.5)
WBC: 11.3 10*3/uL — ABNORMAL HIGH (ref 4.0–10.5)
nRBC: 0 % (ref 0.0–0.2)

## 2019-10-08 LAB — SURGICAL PCR SCREEN
MRSA, PCR: NEGATIVE
Staphylococcus aureus: NEGATIVE

## 2019-10-08 LAB — TYPE AND SCREEN
ABO/RH(D): A POS
Antibody Screen: NEGATIVE

## 2019-10-08 LAB — PROTIME-INR
INR: 1.1 (ref 0.8–1.2)
Prothrombin Time: 14.5 seconds (ref 11.4–15.2)

## 2019-10-08 LAB — RETICULOCYTES
Immature Retic Fract: 61 % — ABNORMAL HIGH (ref 2.3–15.9)
RBC.: 3.3 MIL/uL — ABNORMAL LOW (ref 4.22–5.81)
Retic Count, Absolute: 668.5 10*3/uL — ABNORMAL HIGH (ref 19.0–186.0)
Retic Ct Pct: >30 % — ABNORMAL HIGH (ref 0.4–3.1)

## 2019-10-08 LAB — HIV ANTIBODY (ROUTINE TESTING W REFLEX): HIV Screen 4th Generation wRfx: NONREACTIVE

## 2019-10-08 LAB — APTT: aPTT: 28 seconds (ref 24–36)

## 2019-10-08 LAB — TSH: TSH: 2.247 u[IU]/mL (ref 0.350–4.500)

## 2019-10-08 LAB — SARS CORONAVIRUS 2 (TAT 6-24 HRS): SARS Coronavirus 2: NEGATIVE

## 2019-10-08 MED ORDER — METOPROLOL TARTRATE 25 MG PO TABS
25.0000 mg | ORAL_TABLET | Freq: Two times a day (BID) | ORAL | Status: DC
  Administered 2019-10-08 – 2019-10-18 (×21): 25 mg via ORAL
  Filled 2019-10-08 (×21): qty 1

## 2019-10-08 MED ORDER — SODIUM CHLORIDE 0.9% FLUSH
3.0000 mL | Freq: Two times a day (BID) | INTRAVENOUS | Status: DC
  Administered 2019-10-08 – 2019-10-17 (×14): 3 mL via INTRAVENOUS

## 2019-10-08 MED ORDER — HYDRALAZINE HCL 20 MG/ML IJ SOLN
10.0000 mg | INTRAMUSCULAR | Status: DC | PRN
  Filled 2019-10-08: qty 1

## 2019-10-08 MED ORDER — IPRATROPIUM-ALBUTEROL 0.5-2.5 (3) MG/3ML IN SOLN
3.0000 mL | Freq: Four times a day (QID) | RESPIRATORY_TRACT | Status: DC | PRN
  Administered 2019-10-08: 3 mL via RESPIRATORY_TRACT
  Filled 2019-10-08: qty 3

## 2019-10-08 MED ORDER — SODIUM CHLORIDE 0.9 % IV SOLN
250.0000 mL | INTRAVENOUS | Status: DC | PRN
  Administered 2019-10-11: 250 mL via INTRAVENOUS

## 2019-10-08 MED ORDER — AMLODIPINE BESYLATE 10 MG PO TABS
10.0000 mg | ORAL_TABLET | Freq: Every day | ORAL | Status: DC
  Administered 2019-10-09 – 2019-10-18 (×10): 10 mg via ORAL
  Filled 2019-10-08 (×10): qty 1

## 2019-10-08 MED ORDER — AMLODIPINE BESYLATE 5 MG PO TABS
5.0000 mg | ORAL_TABLET | Freq: Once | ORAL | Status: AC
  Administered 2019-10-08: 5 mg via ORAL
  Filled 2019-10-08: qty 1

## 2019-10-08 MED ORDER — CLONIDINE HCL 0.1 MG PO TABS
0.1000 mg | ORAL_TABLET | Freq: Two times a day (BID) | ORAL | Status: DC
  Administered 2019-10-08 – 2019-10-11 (×8): 0.1 mg via ORAL
  Filled 2019-10-08 (×8): qty 1

## 2019-10-08 MED ORDER — SODIUM CHLORIDE 0.9% FLUSH: 3.0000 mL | INTRAVENOUS | Status: DC | PRN

## 2019-10-08 MED ORDER — FOLIC ACID 1 MG PO TABS
1.0000 mg | ORAL_TABLET | Freq: Every day | ORAL | Status: DC
  Administered 2019-10-08 – 2019-10-18 (×11): 1 mg via ORAL
  Filled 2019-10-08 (×11): qty 1

## 2019-10-08 MED ORDER — VITAMIN B-12 1000 MCG PO TABS
1000.0000 ug | ORAL_TABLET | Freq: Every day | ORAL | Status: DC
  Administered 2019-10-08 – 2019-10-18 (×11): 1000 ug via ORAL
  Filled 2019-10-08 (×11): qty 1

## 2019-10-08 MED ORDER — SODIUM CHLORIDE 0.9 % IV SOLN
INTRAVENOUS | Status: DC
  Administered 2019-10-08 – 2019-10-09 (×3): via INTRAVENOUS

## 2019-10-08 MED ORDER — AMLODIPINE BESYLATE 2.5 MG PO TABS
2.5000 mg | ORAL_TABLET | Freq: Every day | ORAL | Status: DC
  Administered 2019-10-08: 2.5 mg via ORAL
  Filled 2019-10-08: qty 1

## 2019-10-08 MED ORDER — CEFAZOLIN SODIUM-DEXTROSE 2-4 GM/100ML-% IV SOLN
2.0000 g | Freq: Once | INTRAVENOUS | Status: AC
  Administered 2019-10-09: 12:00:00 2 g via INTRAVENOUS
  Filled 2019-10-08: qty 100

## 2019-10-08 MED ORDER — HYDROMORPHONE HCL 1 MG/ML IJ SOLN
2.0000 mg | INTRAMUSCULAR | Status: DC | PRN
  Administered 2019-10-08 – 2019-10-09 (×7): 2 mg via INTRAVENOUS
  Filled 2019-10-08 (×9): qty 2

## 2019-10-08 MED ORDER — IPRATROPIUM-ALBUTEROL 0.5-2.5 (3) MG/3ML IN SOLN
RESPIRATORY_TRACT | Status: AC
  Filled 2019-10-08: qty 3

## 2019-10-08 MED ORDER — ALUM & MAG HYDROXIDE-SIMETH 200-200-20 MG/5ML PO SUSP
30.0000 mL | ORAL | Status: DC | PRN
  Administered 2019-10-08 – 2019-10-11 (×4): 30 mL via ORAL
  Filled 2019-10-08 (×4): qty 30

## 2019-10-08 MED ORDER — ZOLPIDEM TARTRATE 5 MG PO TABS
5.0000 mg | ORAL_TABLET | Freq: Every evening | ORAL | Status: DC | PRN
  Administered 2019-10-09 – 2019-10-16 (×9): 5 mg via ORAL
  Filled 2019-10-08 (×9): qty 1

## 2019-10-08 NOTE — Progress Notes (Signed)
Pt arrived to unit from [facility]. Pt alert and oriented x4. Oriented to room and call bell. Text page [place] admission.

## 2019-10-08 NOTE — Progress Notes (Signed)
Initial Nutrition Assessment  DOCUMENTATION CODES:   Not applicable  INTERVENTION:   -Pt education provided.   NUTRITION DIAGNOSIS:   Increased nutrient needs related to acute illness(splenomegaly) as evidenced by estimated needs.  GOAL:   Patient will meet greater than or equal to 90% of their needs  MONITOR:   PO intake, Weight trends  REASON FOR ASSESSMENT:   Malnutrition Screening Tool    ASSESSMENT:   43 year old pt admitted with left upper quad abdominal pain with PMH of HTN, pulmonary HTN, spherocytosis, hemolytic anemia, B12 deficiency, CKD stage III and splenomegaly. General surgery has been consulted for splenectomy.  Pt was alert and sitting up in bed. Pt stated he eats well at home. He typically eats a breakfast of grits with cheese, hashbrowns and eggs, a lunch of ham or [place] sandwich with potato chips and steak or chicken with a green vegetable for dinner. Pt expressed frustration over low sodium diet in the hospital stating he doesn't follow it at home and finds it unnecessary. Discussed sodium intake and fluid retention but pt was not receptive.   Pt stated he typically weights between 164-168#. This is refected in the chart. Pt is eating 100% of meals.  Medications reviewed: folic acid 1mg , cyanocobalamin 1040mcg  Labs reviewed: Hgb 10.6, glucose 118  NUTRITION - FOCUSED PHYSICAL EXAM:    Most Recent Value  Orbital Region  No depletion  Upper Arm Region  No depletion  Thoracic and Lumbar Region  No depletion  Buccal Region  No depletion  Temple Region  No depletion  Clavicle Bone Region  Mild depletion  Clavicle and Acromion Bone Region  Mild depletion  Scapular Bone Region  No depletion  Dorsal Hand  No depletion  Patellar Region  No depletion  Anterior Thigh Region  No depletion  Posterior Calf Region  No depletion  Edema (RD Assessment)  Mild  Hair  Reviewed  Eyes  Reviewed  Mouth  Reviewed  Skin  Reviewed  Nails  Reviewed        Diet Order:   Diet Order            Diet NPO time specified Except for: Sips with Meds, Ice Chips  Diet effective midnight        Diet 2 gram sodium Room service appropriate? Yes; Fluid consistency: Thin  Diet effective now              EDUCATION NEEDS:   Education needs have been addressed  Skin:  Skin Assessment: Reviewed RN Assessment  Last BM:  11/18  Height:   Ht Readings from Last 1 Encounters:  10/08/19 6\' 2"  (1.88 m)    Weight:   Wt Readings from Last 1 Encounters:  10/08/19 73.5 kg    Ideal Body Weight:  86.4 kg  BMI:  Body mass index is 20.8 kg/m.  Estimated Nutritional Needs:   Kcal:  2000-2200  Protein:  100-110  Fluid:  >2L    Allen Norris Dietetic Intern Pager # [phone]

## 2019-10-08 NOTE — Progress Notes (Signed)
Patient admitted this morning from [facility].  43 year old with history of hereditary spherocytosis, essential hypertension, CKD stage III, diastolic CHF who recently moved here from [place] has been following with Dr. Hinton Rao from oncology for hereditary spherocytosis developed hemoptysis several days ago when he presented to [facility].  He was seen by pulmonologist and it was determined not to proceed with bronchoscopy.  During this time later he started developing left upper quadrant pain and thrombocytopenia with platelets as low as 34 without any other evidence of bleeding.  He was seen by general surgery Dr. Lilia Pro and oncology who recommended transferring the patient to [facility] for splenectomy due to his high risk procedure.  He also received preoperative vaccinations for splenectomy.  Patient seen and examined by me at bedside this morning.  Denies any complaints.  Denies any hemoptysis anymore, shortness of breath or other complaints.  Overall feels well but slight tenderness to palpation of his left upper quadrant abdomen.  Overall vital signs appears to be stable except elevated blood pressures.  Records have been reviewed by me from [facility].  I have consulted general surgery in anticipation for splenectomy.  We will continue to monitor him, if necessary postoperatively we can consult oncology service and/or infectious disease as necessary.  Increase his Norvasc to 10 mg daily.  Call with further questions as needed.  Time Spent 25 mins.   Gerlean Ren MD  [facility]

## 2019-10-08 NOTE — Consult Note (Signed)
[facility] Consult [facility]  Alejandro Perez 1976/01/18  [id number].    Requesting MD: Nelson ChimesAmin Chief Complaint/Reason for Consult: splenomegaly HPI:  Patient is a 43 year old male who was [facility] with hereditary spherocytosis and splenomegaly. He was being considered for splenectomy by Dr. Lequita HaltMorgan but due to pulmonary HTN felt to be too acute for surgery there. Patient reports he thought he had Sickle cell trait previously but when he moved to [place] 2 months ago was worked up and found to actually have hereditary spherocytosis. Patient reports hx of pain episodes, hematuria, GI bleeding, hemoptysis. He currently has LUQ abdominal pain. Denies nausea/vomiting, having bowel function. He does report SOB as well. He had exploratory surgery in 1997 for abdominal pain, has a midline incision from this. He reports he is allergic to morphine. He does not work currently. Patient denies alcohol or tobacco use, reports marijuana use.   ROS: Review of Systems  Constitutional: Negative for chills and fever.  Respiratory: Positive for hemoptysis and shortness of breath.   Gastrointestinal: Positive for abdominal pain and blood in stool. Negative for nausea and vomiting.  Genitourinary: Positive for hematuria.  Endo/Heme/Allergies: Bruises/bleeds easily.  All other systems reviewed and are negative.   Family History  Problem Relation Age of Onset  . Sickle cell trait Mother     Past Medical History:  Diagnosis Date  . Anemia   . Chest pain   . Glaucoma   . Hypertension   . LVH (left ventricular hypertrophy)   . Spleen enlarged   . Thrombocytopenia (HCC)     Past Surgical History:  Procedure Laterality Date  . CHOLECYSTECTOMY      Social History:  reports that he has quit smoking. He has never used smokeless tobacco. He reports that he does not drink alcohol or use drugs.  Allergies:  Allergies  Allergen Reactions  . Morphine And Related Other  (See Comments)    Severe abdominal pain    Medications Prior to Admission  Medication Sig Dispense Refill  . amLODipine (NORVASC) 2.5 MG tablet Take 1 tablet (2.5 mg total) by mouth daily. 30 tablet 0  . cloNIDine (CATAPRES) 0.1 MG tablet Take 0.1 mg by mouth 2 (two) times daily.    . folic acid (FOLVITE) 1 MG tablet Take 1 tablet (1 mg total) by mouth daily. 30 tablet 0  . metoprolol tartrate (LOPRESSOR) 25 MG tablet Take 1 tablet (25 mg total) by mouth 2 (two) times daily. 60 tablet 0  . oxyCODONE-acetaminophen (PERCOCET) 7.5-325 MG tablet Take 2 tablets by mouth every 4 (four) hours as needed for moderate pain. 30 tablet 0  . vitamin B-12 1000 MCG tablet Take 1 tablet (1,000 mcg total) by mouth daily. 30 tablet 0    Blood pressure (!) 175/93, pulse 64, temperature 98.4 F (36.9 C), temperature source Oral, resp. rate 18, height 6\' 2"  (1.88 m), weight 73.5 kg, SpO2 100 %. Physical Exam: Physical Exam Constitutional:      General: He is not in acute distress.    Appearance: He is underweight. He is not toxic-appearing.  HENT:     Head: Normocephalic and atraumatic.     Right Ear: External ear normal.     Left Ear: External ear normal.     Nose: Nose normal.     Mouth/Throat:     Mouth: Mucous membranes are moist.  Eyes:     General: Lids are normal.     Extraocular Movements: Extraocular  movements intact.     Conjunctiva/sclera: Conjunctivae normal.  Neck:     Musculoskeletal: Normal range of motion and neck supple.  Cardiovascular:     Rate and Rhythm: Normal rate and regular rhythm.     Pulses:          Radial pulses are 2+ on the right side and 2+ on the left side.  Pulmonary:     Effort: Pulmonary effort is normal.     Breath sounds: Normal breath sounds.  Abdominal:     General: A surgical scar is present. Bowel sounds are normal. There is no distension.     Palpations: Abdomen is soft. There is splenomegaly. There is no hepatomegaly.     Tenderness: There is  abdominal tenderness in the left upper quadrant. There is no guarding or rebound.  Musculoskeletal:     Comments: ROM grossly intact in bilateral upper and lower extremities  Skin:    General: Skin is warm and dry.  Neurological:     Mental Status: He is alert and oriented to person, place, and time.  Psychiatric:        Attention and Perception: Attention and perception normal.        Mood and Affect: Mood and affect normal.        Speech: Speech normal.        Behavior: Behavior is cooperative.     Results for orders placed or performed during the hospital encounter of 10/08/19 (from the past 48 hour(s))  Urinalysis, Routine w reflex microscopic     Status: None   Collection Time: 10/08/19  4:56 AM  Result Value Ref Range   Color, Urine YELLOW YELLOW   APPearance CLEAR CLEAR   Specific Gravity, Urine 1.013 1.005 - 1.030   pH 6.0 5.0 - 8.0   Glucose, UA NEGATIVE NEGATIVE mg/dL   Hgb urine dipstick NEGATIVE NEGATIVE   Bilirubin Urine NEGATIVE NEGATIVE   Ketones, ur NEGATIVE NEGATIVE mg/dL   Protein, ur NEGATIVE NEGATIVE mg/dL   Nitrite NEGATIVE NEGATIVE   Leukocytes,Ua NEGATIVE NEGATIVE    Comment: Performed at [facility], [address] [place], [place] [address]  Comprehensive metabolic panel     Status: Abnormal   Collection Time: 10/08/19  5:24 AM  Result Value Ref Range   Sodium 138 135 - 145 mmol/L   Potassium 3.9 3.5 - 5.1 mmol/L   Chloride 98 98 - 111 mmol/L   CO2 29 22 - 32 mmol/L   Glucose, Bld 118 (H) 70 - 99 mg/dL   BUN 19 6 - 20 mg/dL   Creatinine, Ser 6.04 (H) 0.61 - 1.24 mg/dL   Calcium 9.4 8.9 - 54.0 mg/dL   Total Protein 5.9 (L) 6.5 - 8.1 g/dL   Albumin 4.1 3.5 - 5.0 g/dL   AST 18 15 - 41 U/L   ALT 17 0 - 44 U/L   Alkaline Phosphatase 47 38 - 126 U/L   Total Bilirubin 2.8 (H) 0.3 - 1.2 mg/dL   GFR calc non Af Amer >60 >60 mL/min   GFR calc Af Amer >60 >60 mL/min   Anion gap 11 5 - 15    Comment: Performed at [facility], [address], [place], [place] [address]  CBC WITH DIFFERENTIAL     Status: Abnormal   Collection Time: 10/08/19  5:24 AM  Result Value Ref Range   WBC 11.3 (H) 4.0 - 10.5 K/uL   RBC 3.40 (L) 4.22 - 5.81  MIL/uL   Hemoglobin 10.6 (L) 13.0 - 17.0 g/dL   HCT 74.2 (L) 59.5 - 63.8 %   MCV 90.6 80.0 - 100.0 fL   MCH 31.2 26.0 - 34.0 pg   MCHC 34.4 30.0 - 36.0 g/dL   RDW 75.6 (H) 43.3 - 29.5 %   Platelets 86 (L) 150 - 400 K/uL    Comment: Immature Platelet Fraction may be clinically indicated, consider ordering this additional test [id number]    nRBC 0.0 0.0 - 0.2 %   Neutrophils Relative % 84 %   Neutro Abs 9.5 (H) 1.7 - 7.7 K/uL   Lymphocytes Relative 6 %   Lymphs Abs 0.7 0.7 - 4.0 K/uL   Monocytes Relative 9 %   Monocytes Absolute 1.0 0.1 - 1.0 K/uL   Eosinophils Relative 0 %   Eosinophils Absolute 0.0 0.0 - 0.5 K/uL   Basophils Relative 0 %   Basophils Absolute 0.0 0.0 - 0.1 K/uL   Immature Granulocytes 1 %   Abs Immature Granulocytes 0.10 (H) 0.00 - 0.07 K/uL    Comment: Performed at [facility], [address] [place], [place] [address]  APTT     Status: None   Collection Time: 10/08/19  5:24 AM  Result Value Ref Range   aPTT 28 24 - 36 seconds    Comment: Performed at [facility], [address] [place], [place] [address]  Protime-INR     Status: None   Collection Time: 10/08/19  5:24 AM  Result Value Ref Range   Prothrombin Time 14.5 11.4 - 15.2 seconds   INR 1.1 0.8 - 1.2    Comment: (NOTE) INR goal varies based on device and disease states. Performed at [facility], [address] [place], [place] [address]   Reticulocytes     Status: Abnormal   Collection Time: 10/08/19  5:24 AM  Result Value Ref Range   Retic Ct Pct >30.0 (H) 0.4 - 3.1 %    Comment: RESULTS CONFIRMED BY MANUAL DILUTION CALLED TO S. MADISON,RN 10/08/2019 0915 DAVISB CORRECTED ON 11/18 AT 0910: PREVIOUSLY REPORTED AS 20.9    RBC. 3.30 (L) 4.22 - 5.81 MIL/uL    Comment: CORRECTED  ON 11/18 AT 0910: PREVIOUSLY REPORTED AS 3.40   Retic Count, Absolute 668.5 (H) 19.0 - 186.0 K/uL    Comment: RESULTS CONFIRMED BY MANUAL DILUTION CORRECTED ON 11/18 AT 0910: PREVIOUSLY REPORTED AS 710.3    Immature Retic Fract 61 (H) 2.3 - 15.9 %    Comment: Performed at [facility], [address], [place], [place] [address] CORRECTED ON 11/18 AT 5427: PREVIOUSLY REPORTED AS 11.4   TSH     Status: None   Collection Time: 10/08/19  5:24 AM  Result Value Ref Range   TSH 2.247 0.350 - 4.500 uIU/mL    Comment: Performed by a 3rd Generation assay with a functional sensitivity of <=0.01 uIU/mL. Performed at [facility], [address], [place], [place] [address]   Type and screen [facility]     Status: None   Collection Time: 10/08/19  5:24 AM  Result Value Ref Range   ABO/RH(D) A POS    Antibody Screen NEG    Sample Expiration      10/11/2019,2359 Performed at [facility], [address], [place], [place] [address]   Surgical pcr screen     Status: None   Collection Time: 10/08/19  6:29 AM  Specimen: Nasal Mucosa; Nasal Swab  Result Value Ref Range   MRSA, PCR NEGATIVE NEGATIVE   Staphylococcus aureus NEGATIVE NEGATIVE    Comment: (NOTE) The Xpert SA Assay (FDA approved for NASAL specimens in patients 40 years of age and older), is one component of a comprehensive surveillance program. It is not intended to diagnose infection nor to guide or monitor treatment. Performed at [facility], [place] [address], [place], [place] [address]    No results found.    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

## 2019-10-08 NOTE — Plan of Care (Signed)
  Problem: Education: Goal: Knowledge of General Education information will improve Description Including pain rating scale, medication(s)/side effects and non-pharmacologic comfort measures Outcome: Progressing   

## 2019-10-08 NOTE — H&P (Signed)
History and Physical    [name] HDQ:[id number] DOB: 09-24-1976 DOA: 10/08/2019  PCP: Patient, No Pcp Per    Patient coming from: [facility]    Chief Complaint: Left upper quadrant abdominal pain  HPI: Alejandro Perez is a 43 y.o. male with medical history significant of hypertension, pulmonary hypertension, sickle cell trait, spherocytosis, hemolytic anemia, splenomegaly, left upper quadrant abdominal pain.  Came with a chief complaint is from the hospital of severe pain.  He had also thrombocytopenia and anemia and was transfused with 1 unit of platelets and 1 unit of blood. Because of his pulmonary hypertension was recommended to have the surgery  at our hospital.  ED Course:  Patient was transferred from [facility]  Review of Systems: As per HPI otherwise 10 point review of systems negative.  Except left upper quadrant abdominal pain  Past Medical History:  Diagnosis Date  . Anemia   . Chest pain   . Glaucoma   . Hypertension   . LVH (left ventricular hypertrophy)   . Spleen enlarged   . Thrombocytopenia (HCC)     Past Surgical History:  Procedure Laterality Date  . CHOLECYSTECTOMY       reports that he has quit smoking. He has never used smokeless tobacco. He reports that he does not drink alcohol or use drugs.  Allergies  Allergen Reactions  . Morphine And Related Other (See Comments)    Severe abdominal pain    Family History  Problem Relation Age of Onset  . Sickle cell trait Mother      Prior to Admission medications   Medication Sig Start Date End Date Taking? Authorizing Provider  amLODipine (NORVASC) 2.5 MG tablet Take 1 tablet (2.5 mg total) by mouth daily. 12/25/17   Rhetta Mura, MD  cloNIDine (CATAPRES) 0.1 MG tablet Take 0.1 mg by mouth 2 (two) times daily.    [provider]  folic acid (FOLVITE) 1 MG tablet Take 1 tablet (1 mg total) by mouth daily. 12/25/17   Rhetta Mura, MD  metoprolol tartrate  (LOPRESSOR) 25 MG tablet Take 1 tablet (25 mg total) by mouth 2 (two) times daily. 12/25/17   Rhetta Mura, MD  oxyCODONE-acetaminophen (PERCOCET) 7.5-325 MG tablet Take 2 tablets by mouth every 4 (four) hours as needed for moderate pain. 12/25/17   Rhetta Mura, MD  vitamin B-12 1000 MCG tablet Take 1 tablet (1,000 mcg total) by mouth daily. 12/25/17   Rhetta Mura, MD    Physical Exam: Vitals:   10/08/19 0244  BP: (!) 168/81  Pulse: 64  Resp: 18  Temp: 98 F (36.7 C)  TempSrc: Oral  SpO2: 100%  Weight: 73.5 kg  Height: 6\' 2"  (1.88 m)    Constitutional: NAD, calm, comfortable Vitals:   10/08/19 0244  BP: (!) 168/81  Pulse: 64  Resp: 18  Temp: 98 F (36.7 C)  TempSrc: Oral  SpO2: 100%  Weight: 73.5 kg  Height: 6\' 2"  (1.88 m)   Eyes: PERRL, lids and conjunctivae normal ENMT: Mucous membranes are moist. Posterior pharynx clear of any exudate or lesions.Normal dentition.  Neck: normal, supple, no masses, no thyromegaly Respiratory: clear to auscultation bilaterally, no wheezing, no crackles. Normal respiratory effort. No accessory muscle use.  Cardiovascular: Regular rate and rhythm, no murmurs / rubs / gallops. No extremity edema. 2+ pedal pulses. No carotid bruits.  Abdomen: Left upper quadrant tenderness, spleen palpated. No hepatosplenomegaly. Bowel sounds positive.  Musculoskeletal: no clubbing / cyanosis. No joint deformity upper and lower extremities.  Good ROM, no contractures. Normal muscle tone.  Skin: no rashes, lesions, ulcers. No induration Neurologic: CN 2-12 grossly intact. Sensation intact, DTR normal. Strength 5/5 in all 4.  Psychiatric: Normal judgment and insight. Alert and oriented x 3. Normal mood.    Labs on Admission: I have personally reviewed following labs and imaging studies  CBC: No results for input(s): WBC, NEUTROABS, HGB, HCT, MCV, PLT in the last 168 hours. Basic Metabolic Panel: No results for input(s): NA, K, CL, CO2,  GLUCOSE, BUN, CREATININE, CALCIUM, MG, PHOS in the last 168 hours. GFR: CrCl cannot be calculated (Patient's most recent lab result is older than the maximum 21 days allowed.). Liver Function Tests: No results for input(s): AST, ALT, ALKPHOS, BILITOT, PROT, ALBUMIN in the last 168 hours. No results for input(s): LIPASE, AMYLASE in the last 168 hours. No results for input(s): AMMONIA in the last 168 hours. Coagulation Profile: No results for input(s): INR, PROTIME in the last 168 hours. Cardiac Enzymes: No results for input(s): CKTOTAL, CKMB, CKMBINDEX, TROPONINI in the last 168 hours. BNP (last 3 results) No results for input(s): PROBNP in the last 8760 hours. HbA1C: No results for input(s): HGBA1C in the last 72 hours. CBG: No results for input(s): GLUCAP in the last 168 hours. Lipid Profile: No results for input(s): CHOL, HDL, LDLCALC, TRIG, CHOLHDL, LDLDIRECT in the last 72 hours. Thyroid Function Tests: No results for input(s): TSH, T4TOTAL, FREET4, T3FREE, THYROIDAB in the last 72 hours. Anemia Panel: No results for input(s): VITAMINB12, FOLATE, FERRITIN, TIBC, IRON, RETICCTPCT in the last 72 hours. Urine analysis:    Component Value Date/Time   COLORURINE YELLOW 12/21/2017 [place] 12/21/2017 1156   LABSPEC 1.010 12/21/2017 1156   PHURINE 7.0 12/21/2017 1156   GLUCOSEU NEGATIVE 12/21/2017 1156   HGBUR TRACE (A) 12/21/2017 1156   BILIRUBINUR SMALL (A) 12/21/2017 1156   KETONESUR NEGATIVE 12/21/2017 [place] 12/21/2017 1156   NITRITE NEGATIVE 12/21/2017 1156   LEUKOCYTESUR NEGATIVE 12/21/2017 1156    Radiological Exams on Admission: No results found.  EKG: Independently reviewed.  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 Lilia Pro in a.m. surgery And oncology. I understood oncology already to vaccinated the patient with the new recommendation. Oncology Dr. Anabel Bene  Left upper quadrant abdominal pain secondary to above Patient was on Dilaudid pump DC continue with Dilaudid as needed  Thrombocytopenia Blood work this morning pending Transfuse if necessary before surgery  Anemia Secondary to sickle cell trait and spherocytosis Plan transfuse if H&H less than 7  Essential hypertension Resume metoprolol and Catapres Norvasc and Lasix  Pulmonary hypertension Lasix and oxygen as needed, resume inhaler  Chronic kidney disease stage III Avoid nephrotoxins follow BUN and creatinine  Vitamin B 12 deficiency Vitamin B12 supplement   DVT prophylaxis: scd  Code Status: Full code Family Communication:  Disposition Plan: Discharge home Consults called: No acute needs to be done Admission status: Full code   Linard Millers Margarite Vessel MD [facility]  If 7PM-7AM, please contact night-coverage [email or web address]   10/08/2019, 4:56 AM

## 2019-10-09 ENCOUNTER — Encounter (HOSPITAL_COMMUNITY)
Admission: AD | Disposition: A | Discharge status: Home / Self Care | Payer: Self-pay | Source: Other Acute Inpatient Hospital | Attending: Internal Medicine

## 2019-10-09 ENCOUNTER — Inpatient Hospital Stay (HOSPITAL_COMMUNITY): Payer: Self-pay | Admitting: Certified Registered"

## 2019-10-09 ENCOUNTER — Encounter (HOSPITAL_COMMUNITY): Payer: Self-pay | Admitting: *Deleted

## 2019-10-09 HISTORY — PX: SPLENECTOMY, TOTAL: SHX788

## 2019-10-09 LAB — CBC
HCT: 25.5 % — ABNORMAL LOW (ref 39.0–52.0)
Hemoglobin: 9.2 g/dL — ABNORMAL LOW (ref 13.0–17.0)
MCH: 30.9 pg (ref 26.0–34.0)
MCHC: 36.1 g/dL — ABNORMAL HIGH (ref 30.0–36.0)
MCV: 85.6 fL (ref 80.0–100.0)
Platelets: 62 10*3/uL — ABNORMAL LOW (ref 150–400)
RBC: 2.98 MIL/uL — ABNORMAL LOW (ref 4.22–5.81)
RDW: 21.8 % — ABNORMAL HIGH (ref 11.5–15.5)
WBC: 8.4 10*3/uL (ref 4.0–10.5)
nRBC: 0.4 % — ABNORMAL HIGH (ref 0.0–0.2)

## 2019-10-09 LAB — BASIC METABOLIC PANEL
Anion gap: 9 (ref 5–15)
BUN: 18 mg/dL (ref 6–20)
CO2: 27 mmol/L (ref 22–32)
Calcium: 8.7 mg/dL — ABNORMAL LOW (ref 8.9–10.3)
Chloride: 102 mmol/L (ref 98–111)
Creatinine, Ser: 1.36 mg/dL — ABNORMAL HIGH (ref 0.61–1.24)
GFR calc Af Amer: >60 mL/min (ref >60)
GFR calc non Af Amer: >60 mL/min (ref >60)
Glucose, Bld: 102 mg/dL — ABNORMAL HIGH (ref 70–99)
Potassium: 3.8 mmol/L (ref 3.5–5.1)
Sodium: 138 mmol/L (ref 135–145)

## 2019-10-09 SURGERY — SPLENECTOMY
Anesthesia: General | Site: Abdomen

## 2019-10-09 MED ORDER — DEXAMETHASONE SODIUM PHOSPHATE 10 MG/ML IJ SOLN
INTRAMUSCULAR | Status: AC
  Filled 2019-10-09: qty 1

## 2019-10-09 MED ORDER — FENTANYL CITRATE (PF) 100 MCG/2ML IJ SOLN
25.0000 ug | INTRAMUSCULAR | Status: DC | PRN
  Administered 2019-10-09 (×3): 50 ug via INTRAVENOUS

## 2019-10-09 MED ORDER — PROMETHAZINE HCL 25 MG/ML IJ SOLN: 6.2500 mg | INTRAMUSCULAR | Status: DC | PRN

## 2019-10-09 MED ORDER — MIDAZOLAM HCL 2 MG/2ML IJ SOLN
INTRAMUSCULAR | Status: AC
  Filled 2019-10-09: qty 2

## 2019-10-09 MED ORDER — SUGAMMADEX SODIUM 200 MG/2ML IV SOLN
INTRAVENOUS | Status: DC | PRN
  Administered 2019-10-09: 300 mg via INTRAVENOUS

## 2019-10-09 MED ORDER — PHENYLEPHRINE 40 MCG/ML (10ML) SYRINGE FOR IV PUSH (FOR BLOOD PRESSURE SUPPORT)
PREFILLED_SYRINGE | INTRAVENOUS | Status: DC | PRN
  Administered 2019-10-09 (×3): 40 ug via INTRAVENOUS

## 2019-10-09 MED ORDER — FENTANYL CITRATE (PF) 250 MCG/5ML IJ SOLN
INTRAMUSCULAR | Status: AC
  Filled 2019-10-09: qty 5

## 2019-10-09 MED ORDER — PROPOFOL 10 MG/ML IV BOLUS
INTRAVENOUS | Status: DC | PRN
  Administered 2019-10-09: 160 mg via INTRAVENOUS

## 2019-10-09 MED ORDER — HYDROMORPHONE HCL 1 MG/ML IJ SOLN
1.0000 mg | INTRAMUSCULAR | Status: DC | PRN
  Administered 2019-10-09 (×4): 2 mg via INTRAVENOUS
  Administered 2019-10-10 (×2): 1 mg via INTRAVENOUS
  Administered 2019-10-10: 2 mg via INTRAVENOUS
  Administered 2019-10-10 (×2): 1 mg via INTRAVENOUS
  Administered 2019-10-10: 2 mg via INTRAVENOUS
  Administered 2019-10-10: 1 mg via INTRAVENOUS
  Administered 2019-10-10 – 2019-10-11 (×6): 2 mg via INTRAVENOUS
  Administered 2019-10-11: 1 mg via INTRAVENOUS
  Filled 2019-10-09 (×2): qty 2
  Filled 2019-10-09 (×2): qty 1
  Filled 2019-10-09: qty 2
  Filled 2019-10-09: qty 1
  Filled 2019-10-09: qty 2
  Filled 2019-10-09: qty 1
  Filled 2019-10-09 (×2): qty 2
  Filled 2019-10-09: qty 1
  Filled 2019-10-09 (×4): qty 2
  Filled 2019-10-09: qty 1
  Filled 2019-10-09 (×2): qty 2

## 2019-10-09 MED ORDER — ROCURONIUM BROMIDE 10 MG/ML (PF) SYRINGE
PREFILLED_SYRINGE | INTRAVENOUS | Status: AC
  Filled 2019-10-09: qty 10

## 2019-10-09 MED ORDER — MIDAZOLAM HCL 5 MG/5ML IJ SOLN
INTRAMUSCULAR | Status: DC | PRN
  Administered 2019-10-09: 2 mg via INTRAVENOUS

## 2019-10-09 MED ORDER — LIDOCAINE 2% (20 MG/ML) 5 ML SYRINGE
INTRAMUSCULAR | Status: DC | PRN
  Administered 2019-10-09: 80 mg via INTRAVENOUS

## 2019-10-09 MED ORDER — GABAPENTIN 600 MG PO TABS
300.0000 mg | ORAL_TABLET | Freq: Three times a day (TID) | ORAL | Status: DC
  Administered 2019-10-09 – 2019-10-18 (×27): 300 mg via ORAL
  Filled 2019-10-09 (×27): qty 1

## 2019-10-09 MED ORDER — 0.9 % SODIUM CHLORIDE (POUR BTL) OPTIME
TOPICAL | Status: DC | PRN
  Administered 2019-10-09: 1000 mL
  Administered 2019-10-09: 2000 mL

## 2019-10-09 MED ORDER — PROPOFOL 10 MG/ML IV BOLUS
INTRAVENOUS | Status: AC
  Filled 2019-10-09: qty 20

## 2019-10-09 MED ORDER — ONDANSETRON HCL 4 MG/2ML IJ SOLN
INTRAMUSCULAR | Status: DC | PRN
  Administered 2019-10-09: 4 mg via INTRAVENOUS

## 2019-10-09 MED ORDER — ONDANSETRON HCL 4 MG/2ML IJ SOLN
INTRAMUSCULAR | Status: AC
  Filled 2019-10-09: qty 2

## 2019-10-09 MED ORDER — ROCURONIUM BROMIDE 50 MG/5ML IV SOSY
PREFILLED_SYRINGE | INTRAVENOUS | Status: DC | PRN
  Administered 2019-10-09: 60 mg via INTRAVENOUS
  Administered 2019-10-09: 20 mg via INTRAVENOUS

## 2019-10-09 MED ORDER — PHENYLEPHRINE 40 MCG/ML (10ML) SYRINGE FOR IV PUSH (FOR BLOOD PRESSURE SUPPORT)
PREFILLED_SYRINGE | INTRAVENOUS | Status: AC
  Filled 2019-10-09: qty 10

## 2019-10-09 MED ORDER — ACETAMINOPHEN 325 MG PO TABS
650.0000 mg | ORAL_TABLET | Freq: Four times a day (QID) | ORAL | Status: DC
  Administered 2019-10-09 – 2019-10-11 (×6): 650 mg via ORAL
  Filled 2019-10-09 (×6): qty 2

## 2019-10-09 MED ORDER — OXYCODONE HCL 5 MG PO TABS
5.0000 mg | ORAL_TABLET | ORAL | Status: DC | PRN
  Administered 2019-10-09 – 2019-10-11 (×9): 10 mg via ORAL
  Filled 2019-10-09 (×9): qty 2

## 2019-10-09 MED ORDER — LIDOCAINE 2% (20 MG/ML) 5 ML SYRINGE
INTRAMUSCULAR | Status: AC
  Filled 2019-10-09: qty 5

## 2019-10-09 MED ORDER — FENTANYL CITRATE (PF) 100 MCG/2ML IJ SOLN
INTRAMUSCULAR | Status: DC | PRN
  Administered 2019-10-09 (×6): 50 ug via INTRAVENOUS

## 2019-10-09 MED ORDER — DEXAMETHASONE SODIUM PHOSPHATE 10 MG/ML IJ SOLN
INTRAMUSCULAR | Status: DC | PRN
  Administered 2019-10-09: 4 mg via INTRAVENOUS

## 2019-10-09 MED ORDER — CHLORHEXIDINE GLUCONATE CLOTH 2 % EX PADS
6.0000 | MEDICATED_PAD | Freq: Every day | CUTANEOUS | Status: DC
  Administered 2019-10-10: 6 via TOPICAL

## 2019-10-09 MED ORDER — LACTATED RINGERS IV SOLN
INTRAVENOUS | Status: DC | PRN
  Administered 2019-10-09: 10:00:00 via INTRAVENOUS

## 2019-10-09 MED ORDER — FENTANYL CITRATE (PF) 100 MCG/2ML IJ SOLN
INTRAMUSCULAR | Status: AC
  Filled 2019-10-09: qty 2

## 2019-10-09 MED ORDER — PHENYLEPHRINE HCL-NACL 10-0.9 MG/250ML-% IV SOLN
INTRAVENOUS | Status: DC | PRN
  Administered 2019-10-09: 20 ug/min via INTRAVENOUS

## 2019-10-09 MED ORDER — METHOCARBAMOL 500 MG PO TABS
500.0000 mg | ORAL_TABLET | Freq: Three times a day (TID) | ORAL | Status: DC
  Administered 2019-10-09 – 2019-10-10 (×5): 500 mg via ORAL
  Filled 2019-10-09 (×5): qty 1

## 2019-10-09 SURGICAL SUPPLY — 38 items
BLADE CLIPPER SURG (BLADE) IMPLANT
CANISTER SUCT 3000ML PPV (MISCELLANEOUS) ×2 IMPLANT
CHLORAPREP W/TINT 26 (MISCELLANEOUS) ×2 IMPLANT
COVER SURGICAL LIGHT HANDLE (MISCELLANEOUS) ×2 IMPLANT
COVER WAND RF STERILE (DRAPES) IMPLANT
DRAPE LAPAROSCOPIC ABDOMINAL (DRAPES) ×2 IMPLANT
DRAPE UTILITY XL STRL (DRAPES) ×4 IMPLANT
DRAPE WARM FLUID 44X44 (DRAPES) ×2 IMPLANT
DRSG OPSITE POSTOP 4X8 (GAUZE/BANDAGES/DRESSINGS) ×2 IMPLANT
ELECT BLADE 6.5 EXT (BLADE) ×2 IMPLANT
ELECT CAUTERY BLADE 6.4 (BLADE) ×2 IMPLANT
ELECT REM PT RETURN 9FT ADLT (ELECTROSURGICAL) ×2
ELECTRODE REM PT RTRN 9FT ADLT (ELECTROSURGICAL) ×1 IMPLANT
GLOVE SURG SIGNA 7.5 PF LTX (GLOVE) ×2 IMPLANT
GOWN STRL REUS W/ TWL LRG LVL3 (GOWN DISPOSABLE) ×2 IMPLANT
GOWN STRL REUS W/ TWL XL LVL3 (GOWN DISPOSABLE) ×1 IMPLANT
GOWN STRL REUS W/TWL LRG LVL3 (GOWN DISPOSABLE) ×2
GOWN STRL REUS W/TWL XL LVL3 (GOWN DISPOSABLE) ×1
HANDLE SUCTION POOLE (INSTRUMENTS) ×1 IMPLANT
KIT BASIN OR (CUSTOM PROCEDURE TRAY) ×2 IMPLANT
KIT TURNOVER KIT B (KITS) ×2 IMPLANT
LIGASURE IMPACT 36 18CM CVD LR (INSTRUMENTS) ×2 IMPLANT
NS IRRIG 1000ML POUR BTL (IV SOLUTION) ×4 IMPLANT
PACK GENERAL/GYN (CUSTOM PROCEDURE TRAY) ×2 IMPLANT
PAD ARMBOARD 7.5X6 YLW CONV (MISCELLANEOUS) ×2 IMPLANT
PENCIL SMOKE EVACUATOR (MISCELLANEOUS) ×2 IMPLANT
SLEEVE SUCTION CATH 165 (SLEEVE) ×2 IMPLANT
SPECIMEN JAR X LARGE (MISCELLANEOUS) ×2 IMPLANT
SPONGE LAP 18X18 RF (DISPOSABLE) ×4 IMPLANT
STAPLER VISISTAT 35W (STAPLE) ×2 IMPLANT
SUCTION POOLE HANDLE (INSTRUMENTS) ×2
SUT PDS AB 1 TP1 96 (SUTURE) ×4 IMPLANT
SUT SILK 2 0 SH CR/8 (SUTURE) ×2 IMPLANT
SUT SILK 2 0 TIES 10X30 (SUTURE) ×2 IMPLANT
SUT SILK 2 0SH CR/8 30 (SUTURE) ×2 IMPLANT
TOWEL GREEN STERILE (TOWEL DISPOSABLE) ×2 IMPLANT
TOWEL GREEN STERILE FF (TOWEL DISPOSABLE) ×2 IMPLANT
TRAY FOLEY MTR SLVR 16FR STAT (SET/KITS/TRAYS/PACK) ×2 IMPLANT

## 2019-10-09 NOTE — Progress Notes (Signed)
Patient ID: Alejandro Perez, male   DOB: [birth year], 43 y.o.   MRN: [id number]   Pre Procedure note for inpatients:   Alejandro Perez has been scheduled for Procedure(s): SPLENECTOMY (N/A) today. The various methods of treatment have been discussed with the patient. After consideration of the risks, benefits and treatment options the patient has consented to the planned procedure.   The patient has been seen and labs reviewed. There are no changes in the patient's condition to prevent proceeding with the planned procedure today.  Recent labs:  Lab Results  Component Value Date   WBC 8.4 10/09/2019   HGB 9.2 (L) 10/09/2019   HCT 25.5 (L) 10/09/2019   PLT 62 (L) 10/09/2019   GLUCOSE 102 (H) 10/09/2019   ALT 17 10/08/2019   AST 18 10/08/2019   NA 138 10/09/2019   K 3.8 10/09/2019   CL 102 10/09/2019   CREATININE 1.36 (H) 10/09/2019   BUN 18 10/09/2019   CO2 27 10/09/2019   TSH 2.247 10/08/2019   INR 1.1 10/08/2019    Coralie Keens, MD 10/09/2019 7:15 AM

## 2019-10-09 NOTE — Plan of Care (Signed)
  Problem: Education: Goal: Knowledge of General Education information will improve Description: Including pain rating scale, medication(s)/side effects and non-pharmacologic comfort measures 10/09/2019 2045 by Forestine Chute, RN Outcome: Progressing 10/09/2019 2045 by Forestine Chute, RN Outcome: Progressing   Problem: Health Behavior/Discharge Planning: Goal: Ability to manage health-related needs will improve 10/09/2019 2045 by Forestine Chute, RN Outcome: Progressing 10/09/2019 2045 by Forestine Chute, RN Outcome: Progressing   Problem: Clinical Measurements: Goal: Ability to maintain clinical measurements within normal limits will improve 10/09/2019 2045 by Forestine Chute, RN Outcome: Progressing 10/09/2019 2045 by Forestine Chute, RN Outcome: Progressing Goal: Will remain free from infection 10/09/2019 2045 by Forestine Chute, RN Outcome: Progressing 10/09/2019 2045 by Forestine Chute, RN Outcome: Progressing Goal: Diagnostic test results will improve 10/09/2019 2045 by Forestine Chute, RN Outcome: Progressing 10/09/2019 2045 by Forestine Chute, RN Outcome: Progressing Goal: Respiratory complications will improve 10/09/2019 2045 by Forestine Chute, RN Outcome: Progressing 10/09/2019 2045 by Forestine Chute, RN Outcome: Progressing Goal: Cardiovascular complication will be avoided 10/09/2019 2045 by Forestine Chute, RN Outcome: Progressing 10/09/2019 2045 by Forestine Chute, RN Outcome: Progressing   Problem: Activity: Goal: Risk for activity intolerance will decrease 10/09/2019 2045 by Forestine Chute, RN Outcome: Progressing 10/09/2019 2045 by Forestine Chute, RN Outcome: Progressing   Problem: Nutrition: Goal: Adequate nutrition will be maintained 10/09/2019 2045 by Forestine Chute, RN Outcome: Progressing 10/09/2019 2045 by Forestine Chute, RN Outcome:  Progressing   Problem: Coping: Goal: Level of anxiety will decrease 10/09/2019 2045 by Forestine Chute, RN Outcome: Progressing 10/09/2019 2045 by Forestine Chute, RN Outcome: Progressing   Problem: Elimination: Goal: Will not experience complications related to bowel motility 10/09/2019 2045 by Forestine Chute, RN Outcome: Progressing 10/09/2019 2045 by Forestine Chute, RN Outcome: Progressing Goal: Will not experience complications related to urinary retention 10/09/2019 2045 by Forestine Chute, RN Outcome: Progressing 10/09/2019 2045 by Forestine Chute, RN Outcome: Progressing   Problem: Pain Managment: Goal: General experience of comfort will improve 10/09/2019 2045 by Forestine Chute, RN Outcome: Progressing 10/09/2019 2045 by Forestine Chute, RN Outcome: Progressing   Problem: Safety: Goal: Ability to remain free from injury will improve 10/09/2019 2045 by Forestine Chute, RN Outcome: Progressing 10/09/2019 2045 by Forestine Chute, RN Outcome: Progressing   Problem: Skin Integrity: Goal: Risk for impaired skin integrity will decrease 10/09/2019 2045 by Forestine Chute, RN Outcome: Progressing 10/09/2019 2045 by Forestine Chute, RN Outcome: Progressing   Problem: Education: Goal: Required Educational Video(s) Outcome: Progressing   Problem: Clinical Measurements: Goal: Ability to maintain clinical measurements within normal limits will improve Outcome: Progressing Goal: Postoperative complications will be avoided or minimized Outcome: Progressing   Problem: Skin Integrity: Goal: Demonstration of wound healing without infection will improve Outcome: Progressing

## 2019-10-09 NOTE — Plan of Care (Signed)

## 2019-10-09 NOTE — Transfer of Care (Signed)
Immediate Anesthesia Transfer of Care Note  Patient: Alejandro Perez  Procedure(s) Performed: SPLENECTOMY (N/A Abdomen)  Patient Location: PACU  Anesthesia Type:General  Level of Consciousness: awake and patient cooperative  Airway & Oxygen Therapy: Patient Spontanous Breathing and Patient connected to nasal cannula oxygen  Post-op Assessment: Report given to RN, Post -op Vital signs reviewed and stable and Patient moving all extremities X 4  Post vital signs: Reviewed and stable  Last Vitals:  Vitals Value Taken Time  BP 178/88 10/09/19 1310  Temp    Pulse 72 10/09/19 1311  Resp 17 10/09/19 1311  SpO2 100 % 10/09/19 1311  Vitals shown include unvalidated device data.  Last Pain:  Vitals:   10/09/19 0533  TempSrc:   PainSc: Asleep         Complications: No apparent anesthesia complications

## 2019-10-09 NOTE — Anesthesia Postprocedure Evaluation (Signed)
Anesthesia Post Note  Patient: Drakkar Medeiros  Procedure(s) Performed: SPLENECTOMY (N/A Abdomen)     Patient location during evaluation: PACU Anesthesia Type: General Level of consciousness: awake and alert Pain management: pain level controlled Vital Signs Assessment: post-procedure vital signs reviewed and stable Respiratory status: spontaneous breathing, nonlabored ventilation, respiratory function stable and patient connected to nasal cannula oxygen Cardiovascular status: blood pressure returned to baseline and stable Postop Assessment: no apparent nausea or vomiting Anesthetic complications: no    Last Vitals:  Vitals:   10/09/19 1410 10/09/19 1426  BP: (!) 150/80 (!) 155/88  Pulse: 66 70  Resp: 16   Temp: 36.8 C   SpO2: 100% 99%                  Audry Pili

## 2019-10-09 NOTE — Anesthesia Preprocedure Evaluation (Addendum)
Anesthesia Evaluation  Patient identified by MRN, date of birth, ID band Patient awake    Reviewed: Allergy & Precautions, NPO status , Patient's Chart, lab work & pertinent test results  History of Anesthesia Complications Negative for: history of anesthetic complications  Airway Mallampati: I  TM Distance: >3 FB Neck ROM: Full    Dental  (+) Dental Advisory Given, Teeth Intact   Pulmonary former smoker,    Pulmonary exam normal        Cardiovascular hypertension, Pt. on medications and Pt. on home beta blockers Normal cardiovascular exam+ Valvular Problems/Murmurs AI    '19 TTE - mild concentric LVH. EF 60% to 65%. Grade 2 diastolic dysfunction. Moderate AI, mild MR. Both atria and RV were mildly enlarged. PASP was moderately increased. PA peak pressure: 63 mm Hg    Neuro/Psych negative neurological ROS  negative psych ROS   GI/Hepatic negative GI ROS, Neg liver ROS,   Endo/Other  negative endocrine ROS  Renal/GU Renal InsufficiencyRenal disease     Musculoskeletal negative musculoskeletal ROS (+)   Abdominal   Peds  Hematology  (+) anemia ,  Hereditary spherocytosis Thrombocytopenia - Plt 62k Splenomegaly    Anesthesia Other Findings   Reproductive/Obstetrics                            Anesthesia Physical Anesthesia Plan  ASA: III  Anesthesia Plan: General   Post-op Pain Management:    Induction: Intravenous  PONV Risk Score and Plan: 4 or greater and Treatment may vary due to age or medical condition, Ondansetron, Scopolamine patch - Pre-op, Midazolam and Dexamethasone  Airway Management Planned: Oral ETT  Additional Equipment: None  Intra-op Plan:   Post-operative Plan: Extubation in OR  Informed Consent: I have reviewed the patients History and Physical, chart, labs and discussed the procedure including the risks, benefits and alternatives for the proposed  anesthesia with the patient or authorized representative who has indicated his/her understanding and acceptance.     Dental advisory given  Plan Discussed with: CRNA and Anesthesiologist  Anesthesia Plan Comments:        Anesthesia Quick Evaluation

## 2019-10-09 NOTE — Anesthesia Procedure Notes (Signed)
Procedure Name: Intubation Date/Time: 10/09/2019 11:40 AM Performed by: Orlie Dakin, CRNA Pre-anesthesia Checklist: Patient identified, Emergency Drugs available, Suction available and Patient being monitored Patient Re-evaluated:Patient Re-evaluated prior to induction Oxygen Delivery Method: Circle system utilized Preoxygenation: Pre-oxygenation with 100% oxygen Induction Type: IV induction Ventilation: Mask ventilation without difficulty Laryngoscope Size: Miller and 3 Grade View: Grade II Tube type: Oral Tube size: 7.5 mm Number of attempts: 1 Airway Equipment and Method: Stylet Placement Confirmation: ETT inserted through vocal cords under direct vision,  positive ETCO2 and breath sounds checked- equal and bilateral Secured at: 22 cm Tube secured with: Tape Dental Injury: Teeth and Oropharynx as per pre-operative assessment  Comments: 4x4s bite block used.

## 2019-10-09 NOTE — Progress Notes (Signed)
PROGRESS NOTE    Patient: Alejandro Perez                            PCP: Patient, No Pcp Per                    DOB: 02-08-1976            DOA: 10/08/2019 [id number]             DOS: 10/09/2019, 10:21 AM   LOS: 1 day   Date of Service: The patient was seen and examined on 10/09/2019  Subjective:   The patient was seen and examined this morning, stable still complaining of abdominal discomfort. He has been n.p.o. in anticipation of surgery today. No issues overnight.  Brief Narrative/HPI:   Jewell Ryans is a 43 y.o. male with medical history significant of hypertension, pulmonary hypertension, sickle cell trait, spherocytosis, hemolytic anemia, splenomegaly, left upper quadrant abdominal pain.  Came with a chief complaint is from the hospital of severe pain.  He had also thrombocytopenia and anemia and was transfused with 1 unit of platelets and 1 unit of blood. Because of his pulmonary hypertension was recommended to have the surgery  at [facility] hospital.   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 Lilia Pro, oncologist Dr. Hinton Rao at [facility] evaluated -recommended splenomegaly Needs to be notified Dr. Lilia Pro in a.m. surgery I understood oncology already to vaccinated the patient with the new recommendation. -General surgery consulted -N.p.o. overnight -Planning for splenomegaly 10/09/2019  Abdominal pain Left upper quadrant abdominal pain secondary to above Patient was on Dilaudid pump DC continue with Dilaudid as needed  Thrombocytopenia Blood work this morning pending Transfuse if necessary before surgery  Anemia- Secondary to sickle  cell trait and spherocytosis Plan transfuse if H&H less than 7  Essential hypertension Resume metoprolol and Catapres Norvasc and Lasix -While n.p.o. as needed hydralazine  Pulmonary hypertension -Monitoring closely, as needed Lasix, continue O2 supplements   Chronic kidney disease stage III Avoid nephrotoxins follow BUN and creatinine  Vitamin B 12 deficiency Vitamin B12 supplement   DVT prophylaxis: scd  Code Status: Full code Family Communication:  Disposition Plan: Discharge home Consults called: No acute needs to be done Admission status: Full code     DVT prophylaxis: SCD/Compression stockings Code Status:   Code Status: Full Code Family Communication: No family member present at bedside- attempt will be made to update daily The above findings and plan of care has been discussed with patient in detail,  they expressed understanding and agreement of above. Disposition Plan:  >3 days Admission status:  NPATIEN -   Consultants: General surgery  Procedures:     Splenomegaly 10/09/2019   Antimicrobials:  Anti-infectives (From admission, onward)   Start     Dose/Rate Route Frequency Ordered Stop   10/09/19 0915  [MAR Hold]  ceFAZolin (ANCEF) IVPB 2g/100 mL premix     (MAR Hold since Thu 10/09/2019 at 0923.Hold Reason: Transfer to a Procedural area.)   2 g 200 mL/hr over 30 Minutes Intravenous  Once 10/08/19 1408  Medication:  . [MAR Hold] amLODipine  10 mg Oral Daily  . [MAR Hold] cloNIDine  0.1 mg Oral BID  . [MAR Hold] folic acid  1 mg Oral Daily  . [MAR Hold] metoprolol tartrate  25 mg Oral BID  . [MAR Hold] sodium chloride flush  3 mL Intravenous Q12H  . [MAR Hold] cyanocobalamin  1,000 mcg Oral Daily    [MAR Hold] sodium chloride, [MAR Hold] alum & mag hydroxide-simeth, [MAR Hold] hydrALAZINE, [MAR Hold]  HYDROmorphone (DILAUDID) injection, [MAR Hold] ipratropium-albuterol, [MAR Hold] sodium chloride flush, [MAR Hold] zolpidem    Objective:   Vitals:   10/08/19 1616 10/08/19 2219 10/09/19 0505 10/09/19 0930  BP:  (!) 164/98 (!) 139/97   Pulse: 72 69 68   Resp: 18 18 19    Temp:  98.1 F (36.7 C) 98.3 F (36.8 C)   TempSrc:  Oral Oral   SpO2: 99% 98% 100%   Weight:    73.5 kg  Height:    6\' 2"  (1.88 m)    Intake/Output Summary (Last 24 hours) at 10/09/2019 1021 Last data filed at 10/09/2019 0505 Gross per 24 hour  Intake 2237.56 ml  Output 1300 ml  Net 937.56 ml   Filed Weights   10/08/19 0244 10/09/19 0930  Weight: 73.5 kg 73.5 kg     Examination:   Physical Exam  Constitution:  Alert, cooperative, no distress,  Appears calm and comfortable  Psychiatric: Normal and stable mood and affect, cognition intact,   HEENT: Normocephalic, PERRL, otherwise with in Normal limits  Chest:Chest symmetric Cardio vascular:  S1/S2, RRR, No murmure, No Rubs or Gallops  pulmonary: Clear to auscultation bilaterally, respirations unlabored, negative wheezes / crackles Abdomen: Soft, tender, non-distended, bowel sounds, splenomegaly Muscular skeletal: Limited exam - in bed, able to move all 4 extremities, Normal strength,  Neuro: CNII-XII intact. , normal motor and sensation, reflexes intact  Extremities: No pitting edema lower extremities, +2 pulses  Skin: Dry, warm to touch, negative for any Rashes, No open wounds Wounds: per nursing documentation  LABs:  CBC Latest Ref Rng & Units 10/09/2019 10/08/2019 12/25/2017  WBC 4.0 - 10.5 K/uL 8.4 11.3(H) 8.7  Hemoglobin 13.0 - 17.0 g/dL 10/10/2019) 10.6(L) 8.9(L)  Hematocrit 39.0 - 52.0 % 25.5(L) 30.8(L) 25.1(L)  Platelets 150 - 400 K/uL 62(L) 86(L) 49(L)   CMP Latest Ref Rng & Units 10/09/2019 10/08/2019 12/23/2017  Glucose 70 - 99 mg/dL 10/10/2019) 02/20/2018) 233(A)  BUN 6 - 20 mg/dL 18 19 17   Creatinine 0.61 - 1.24 mg/dL 076(A) 263(F) )  Sodium 135 - 145 mmol/L 138 138 136  Potassium 3.5 - 5.1 mmol/L 3.8 3.9 4.0  Chloride 98 - 111 mmol/L 102 98 104  CO2 22 - 32 mmol/L  27 29 23   Calcium 8.9 - 10.3 mg/dL 3.54(T) 9.4 6.25(W)  Total Protein 6.5 - 8.1 g/dL - 5.9(L) 5.9(L)  Total Bilirubin 0.3 - 1.2 mg/dL - 2.8(H) 2.9(H)  Alkaline Phos 38 - 126 U/L - 47 73  AST 15 - 41 U/L - 18 27  ALT 0 - 44 U/L - 17 29        SIGNED: 3.89(H, MD, FACP, FHM. [facility],  Pager [phone]  If 7PM-7AM, please contact night-coverage [email or web address], Password [facility] 10/09/2019, 10:21 AM

## 2019-10-09 NOTE — Op Note (Signed)
SPLENECTOMY  Procedure Note  Alejandro Perez 10/08/2019 - 10/09/2019   Pre-op Diagnosis: hereditary spherocytosis     Post-op Diagnosis: same  Procedure(s): SPLENECTOMY  Surgeon(s): Coralie Keens, MD  Anesthesia: General  Staff:  Circulator: Cyd Silence, RN; Riojas, Marita [place], RN Physician Assistant: Vivia Ewing, PA-C Scrub Person: Cyd Silence, RN; Dollene Cleveland T  Estimated Blood Loss: 250 cc               Specimens: sent to path  Indications: This is a 43 year old gentleman with hereditary spherocytosis and thrombocytopenia.  He has an enlarged spleen to 15 cm.  The decision was made to proceed with a splenectomy.  Findings: The patient had extensive adhesions from his previous surgery.  He had a very enlarged spleen as well as 2 smaller accessory spleens which were all excised and sent to pathology.  Procedure: The patient was brought to the operating room and identifies correct patient.  He was placed upon the operating room table and general anesthesia was induced.  His abdomen was then prepped and draped in usual sterile fashion.  I made an upper midline incision through his previous scar.  I then took this down through the fascia and peritoneum with electrocautery.  He had extensive adhesions of omentum to the abdominal wall which I took down with the cautery.  The left lobe of the liver was also stuck to the abdominal wall.  The patient had a very large spleen.  There were extensive lesions around the spleen going to the abdominal wall into the stomach.  I slowly took these down with electrocautery.  I then took down attachments to the lower pole of the spleen with the LigaSure and the cautery just above the splenic flexure.  I then took down the short gastric vessels with the LigaSure as well.  I Slowly rotating the spleen and taken down adhesions.  Several superficial adhesions to the spleen were taken down with clamps and silk ties and silk suture  ligatures.  I could then finally eviscerate the spleen.  I clamped off the pole vessels with Kelly clamps and then excised the spleen and removed it.  The splenic vessels were then tied off with 2-0 silk suture ligatures.  There were 2 accessory spleens which I excised with the LigaSure device as well.  These were also sent to pathology with the main spleen.  I then thoroughly irrigated the left upper quadrant with normal saline.  I reinforced the short gastric vessels with 2-0 silk sutures.  I then reevaluated all areas in the left upper quadrant including the vasculature and saw no evidence of bleeding.  We then closed the midline fascia with a running #1 looped PDS suture.  The skin was then irrigated and closed skin staples.  The patient tolerated the procedure well.  All the counts were correct at the end of the procedure.  The patient was then extubated in the operating room and taken in stable condition to the recovery room.          Coralie Keens   Date: 10/09/2019  Time: 12:58 PM

## 2019-10-10 ENCOUNTER — Other Ambulatory Visit: Payer: Self-pay

## 2019-10-10 ENCOUNTER — Encounter (HOSPITAL_COMMUNITY): Payer: Self-pay | Admitting: Surgery

## 2019-10-10 LAB — CBC WITH DIFFERENTIAL/PLATELET
Abs Immature Granulocytes: 0.08 10*3/uL — ABNORMAL HIGH (ref 0.00–0.07)
Basophils Absolute: 0 10*3/uL (ref 0.0–0.1)
Basophils Relative: 0 %
Eosinophils Absolute: 0.1 10*3/uL (ref 0.0–0.5)
Eosinophils Relative: 1 %
HCT: 27.3 % — ABNORMAL LOW (ref 39.0–52.0)
Hemoglobin: 9.3 g/dL — ABNORMAL LOW (ref 13.0–17.0)
Immature Granulocytes: 1 %
Lymphocytes Relative: 9 %
Lymphs Abs: 1.5 10*3/uL (ref 0.7–4.0)
MCH: 30.5 pg (ref 26.0–34.0)
MCHC: 34.1 g/dL (ref 30.0–36.0)
MCV: 89.5 fL (ref 80.0–100.0)
Monocytes Absolute: 3.5 10*3/uL — ABNORMAL HIGH (ref 0.1–1.0)
Monocytes Relative: 22 %
Neutro Abs: 10.6 10*3/uL — ABNORMAL HIGH (ref 1.7–7.7)
Neutrophils Relative %: 67 %
Platelets: 163 10*3/uL (ref 150–400)
RBC: 3.05 MIL/uL — ABNORMAL LOW (ref 4.22–5.81)
RDW: 20.9 % — ABNORMAL HIGH (ref 11.5–15.5)
WBC: 15.8 10*3/uL — ABNORMAL HIGH (ref 4.0–10.5)
nRBC: 0.3 % — ABNORMAL HIGH (ref 0.0–0.2)

## 2019-10-10 LAB — SURGICAL PATHOLOGY

## 2019-10-10 LAB — BASIC METABOLIC PANEL
Anion gap: 11 (ref 5–15)
BUN: 18 mg/dL (ref 6–20)
CO2: 25 mmol/L (ref 22–32)
Calcium: 8.7 mg/dL — ABNORMAL LOW (ref 8.9–10.3)
Chloride: 97 mmol/L — ABNORMAL LOW (ref 98–111)
Creatinine, Ser: 1.55 mg/dL — ABNORMAL HIGH (ref 0.61–1.24)
GFR calc Af Amer: >60 mL/min (ref >60)
GFR calc non Af Amer: 53 mL/min — ABNORMAL LOW (ref >60)
Glucose, Bld: 127 mg/dL — ABNORMAL HIGH (ref 70–99)
Potassium: 4.1 mmol/L (ref 3.5–5.1)
Sodium: 133 mmol/L — ABNORMAL LOW (ref 135–145)

## 2019-10-10 LAB — CBC
HCT: 26.7 % — ABNORMAL LOW (ref 39.0–52.0)
Hemoglobin: 9.5 g/dL — ABNORMAL LOW (ref 13.0–17.0)
MCH: 31.5 pg (ref 26.0–34.0)
MCHC: 35.6 g/dL (ref 30.0–36.0)
MCV: 88.4 fL (ref 80.0–100.0)
Platelets: 148 10*3/uL — ABNORMAL LOW (ref 150–400)
RBC: 3.02 MIL/uL — ABNORMAL LOW (ref 4.22–5.81)
RDW: 20.7 % — ABNORMAL HIGH (ref 11.5–15.5)
WBC: 16.4 10*3/uL — ABNORMAL HIGH (ref 4.0–10.5)
nRBC: 0.4 % — ABNORMAL HIGH (ref 0.0–0.2)

## 2019-10-10 MED ORDER — SODIUM CHLORIDE 0.9 % IV SOLN
INTRAVENOUS | Status: AC
  Administered 2019-10-10 – 2019-10-11 (×4): via INTRAVENOUS

## 2019-10-10 NOTE — Progress Notes (Signed)
PROGRESS NOTE    Patient: Alejandro Perez                            PCP: Patient, No Pcp Per                    DOB: [birth year]            DOA: 10/08/2019 [id number]             DOS: 10/10/2019, 11:30 AM   LOS: 2 days   Date of Service: The patient was seen and examined on 10/10/2019  Subjective:   Postop day #1 Status post splenomegaly by general surgery Dr. Ninfa Linden on 10/09/2019  He tolerated procedure well. Patient was seen and examined this morning, tolerating p.o. Complaining of excessive pain diffusely in his abdomen.  Asking if his pain medication can be modified, increased. Currently remains stable. No issues overnight.  Brief Narrative/HPI:   Alejandro Perez is a 43 y.o. male with medical history significant of hypertension, pulmonary hypertension, sickle cell trait, spherocytosis, hemolytic anemia, splenomegaly, left upper quadrant abdominal pain.  Came with a chief complaint is from the hospital of severe pain.  He had also thrombocytopenia and anemia and was transfused with 1 unit of platelets and 1 unit of blood. Because of his pulmonary hypertension was recommended to have the surgery  at [facility] hospital.   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 Ninfa Linden -Tolerated procedure, stable, poor surgical abdominal tenderness/pain -General surgery recommended advancing diet today.   - X-Ferd from [facility] -for splenomegaly - General surgery Dr. Lilia Pro, oncologist Dr. Hinton Rao at [facility] evaluated -recommended splenomegaly I understood oncology already to vaccinated the patient with the new recommendation.   Abdominal pain  -Postop diffuse mild to moderate pain -Modifying current IV n.p.o. analgesics  Thrombocytopenia -Follow labs closely -Improved platelets 62->>148->>163 now  Anemia- Secondary to sickle cell trait and spherocytosis Plan transfuse if H&H less than 7 -Hemoglobin stable at 9.3 this morning  Essential hypertension Resume metoprolol and Catapres Norvasc and Lasix -While n.p.o. as needed hydralazine  Pulmonary hypertension -Monitoring closely, as needed Lasix, continue O2 supplements   Chronic kidney disease stage III Avoid nephrotoxins follow BUN and creatinine  Vitamin B 12 deficiency Vitamin B12 supplement   DVT prophylaxis: scd  Code Status: Full code Family Communication:  Disposition Plan: Discharge home Consults called: No acute needs to be done Admission status: Full code     DVT prophylaxis: SCD/Compression stockings Code Status:   Code Status: Full Code Family Communication: No family member present at bedside- attempt will be made to update daily The above findings and plan of care has been discussed with patient in detail,  they expressed understanding and agreement of above. Disposition Plan:  >3 days Admission status:  NPATIEN -   Consultants: General surgery  Procedures:     Splenomegaly 10/09/2019   Antimicrobials:  Anti-infectives (From admission, onward)   Start     Dose/Rate Route Frequency Ordered Stop   10/09/19 0915  ceFAZolin (ANCEF) IVPB 2g/100 mL premix     2 g  200 mL/hr over 30 Minutes Intravenous  Once 10/08/19 1408 10/09/19 1157       Medication:  . acetaminophen  650 mg Oral Q6H  . amLODipine  10 mg Oral Daily  . Chlorhexidine Gluconate Cloth  6 each Topical Daily  . cloNIDine  0.1 mg Oral BID  . folic acid  1 mg Oral Daily  . gabapentin  300 mg Oral TID  . methocarbamol  500 mg Oral TID  . metoprolol tartrate  25 mg Oral BID  . sodium chloride flush  3 mL Intravenous Q12H  . cyanocobalamin  1,000 mcg Oral Daily     sodium chloride, alum & mag hydroxide-simeth, hydrALAZINE, HYDROmorphone (DILAUDID) injection, ipratropium-albuterol, oxyCODONE, sodium chloride flush, zolpidem   Objective:   Vitals:   10/09/19 2132 10/10/19 0216 10/10/19 0558 10/10/19 1100  BP: (!) 169/89 128/78 (!) 157/95 (!) 155/86  Pulse: 72 69 73 78  Resp: 18 17 19 18   Temp: 97.6 F (36.4 C) 97.7 F (36.5 C) 99.8 F (37.7 C) 98.7 F (37.1 C)  TempSrc: Oral Oral Oral Oral  SpO2: 100% 99% 100% 100%  Weight:      Height:        Intake/Output Summary (Last 24 hours) at 10/10/2019 1130 Last data filed at 10/10/2019 0825 Gross per 24 hour  Intake 2269.41 ml  Output 1080 ml  Net 1189.41 ml   Filed Weights   10/08/19 0244 10/09/19 0930  Weight: 73.5 kg 73.5 kg     Examination:    Physical Exam  Constitution:  Alert, cooperative, no distress,  Psychiatric: Normal and stable mood and affect, cognition intact,   HEENT: Normocephalic, PERRL, otherwise with in Normal limits  Chest:Chest symmetric Cardio vascular:  S1/S2, RRR, No murmure, No Rubs or Gallops  pulmonary: Clear to auscultation bilaterally, respirations unlabored, negative wheezes / crackles Abdomen: Diffusely tender specifically around surgical wound area otherwise soft, non-distended, bowel sounds,no masses, no organomegaly Muscular skeletal: Limited exam - in bed, able to move all 4 extremities, Normal strength,  Neuro: CNII-XII intact. , normal motor and sensation, reflexes intact  Extremities: No pitting edema lower extremities, +2 pulses  Skin: Dry, warm to touch, negative for any Rashes, No open wounds Wounds: Surgical abdominal wound examined, dressing in place, mildly tender, negative erythema edema or drainage.       LABs:  CBC Latest Ref Rng & Units 10/10/2019 10/10/2019 10/09/2019  WBC 4.0 - 10.5 K/uL 15.8(H) 16.4(H) 8.4  Hemoglobin 13.0 - 17.0 g/dL 3.6(U9.3(L) 4.4(I9.5(L) 3.4(V9.2(L)  Hematocrit 39.0 - 52.0 % 27.3(L) 26.7(L) 25.5(L)  Platelets 150 -  400 K/uL 163 148(L) 62(L)   CMP Latest Ref Rng & Units 10/10/2019 10/09/2019 10/08/2019  Glucose 70 - 99 mg/dL 425(Z127(H) 563(O102(H) 756(E118(H)  BUN 6 - 20 mg/dL 18 18 19   Creatinine 0.61 - 1.24 mg/dL 3.32(R1.55(H) 5.18(A1.36(H) 4.16(S1.40(H)  Sodium 135 - 145 mmol/L 133(L) 138 138  Potassium 3.5 - 5.1 mmol/L 4.1 3.8 3.9  Chloride 98 - 111 mmol/L 97(L) 102 98  CO2 22 - 32 mmol/L 25 27 29   Calcium 8.9 - 10.3 mg/dL 0.6(T8.7(L) 0.1(S8.7(L) 9.4  Total Protein 6.5 - 8.1 g/dL - - 5.9(L)  Total Bilirubin 0.3 - 1.2 mg/dL - - 2.8(H)  Alkaline Phos 38 - 126 U/L - - 47  AST 15 - 41 U/L - - 18  ALT 0 - 44 U/L - - 17        SIGNED: Kendell BaneSeyed A Shahmehdi, MD, FACP, FHM. [facility],  Pager [phone]  If 7PM-7AM, please contact night-coverage [email or web address], Password [facility] 10/10/2019, 11:30 AM

## 2019-10-10 NOTE — Progress Notes (Signed)
1 Day Post-Op   Subjective/Chief Complaint: Complains of incisional pain   Objective: Vital signs in last 24 hours: Temp:  [97.6 F (36.4 C)-99.8 F (37.7 C)] 99.8 F (37.7 C) (11/20 0558) Pulse Rate:  [66-73] 73 (11/20 0558) Resp:  [10-19] 19 (11/20 0558) BP: (128-178)/(73-95) 157/95 (11/20 0558) SpO2:  [98 %-100 %] 100 % (11/20 0558) Weight:  [73.5 kg] 73.5 kg (11/19 0930) Last BM Date: 10/08/19  Intake/Output from previous day: 11/19 0701 - 11/20 0700 In: 2146.4 [P.O.:200; I.V.:1946.4] Out: 1180 [Urine:930; Blood:250] Intake/Output this shift: No intake/output data recorded.  Exam: Awake and alert Overall, looks good Abdomen soft, dressing dry  Lab Results:  Recent Labs    10/08/19 0524 10/09/19 0329  WBC 11.3* 8.4  HGB 10.6* 9.2*  HCT 30.8* 25.5*  PLT 86* 62*   BMET Recent Labs    10/08/19 0524 10/09/19 0329  NA 138 138  K 3.9 3.8  CL 98 102  CO2 29 27  GLUCOSE 118* 102*  BUN 19 18  CREATININE 1.40* 1.36*  CALCIUM 9.4 8.7*   PT/INR Recent Labs    10/08/19 0524  LABPROT 14.5  INR 1.1   ABG No results for input(s): PHART, HCO3 in the last 72 hours.  Invalid input(s): PCO2, PO2  Studies/Results: No results found.  Anti-infectives: Anti-infectives (From admission, onward)   Start     Dose/Rate Route Frequency Ordered Stop   10/09/19 0915  ceFAZolin (ANCEF) IVPB 2g/100 mL premix     2 g 200 mL/hr over 30 Minutes Intravenous  Once 10/08/19 1408 10/09/19 1157      Assessment/Plan: s/p Procedure(s): SPLENECTOMY (N/A)  Waiting for CBC to be draw today to see platelet response to splenectomy Clear liquid diet  LOS: 2 days    Coralie Keens 10/10/2019

## 2019-10-11 DIAGNOSIS — R110 Nausea: Secondary | ICD-10-CM

## 2019-10-11 LAB — BASIC METABOLIC PANEL
Anion gap: 7 (ref 5–15)
BUN: 12 mg/dL (ref 6–20)
CO2: 27 mmol/L (ref 22–32)
Calcium: 8.6 mg/dL — ABNORMAL LOW (ref 8.9–10.3)
Chloride: 102 mmol/L (ref 98–111)
Creatinine, Ser: 1.36 mg/dL — ABNORMAL HIGH (ref 0.61–1.24)
GFR calc Af Amer: >60 mL/min (ref >60)
GFR calc non Af Amer: >60 mL/min (ref >60)
Glucose, Bld: 98 mg/dL (ref 70–99)
Potassium: 3.9 mmol/L (ref 3.5–5.1)
Sodium: 136 mmol/L (ref 135–145)

## 2019-10-11 LAB — CBC
HCT: 24.4 % — ABNORMAL LOW (ref 39.0–52.0)
Hemoglobin: 8.3 g/dL — ABNORMAL LOW (ref 13.0–17.0)
MCH: 31.2 pg (ref 26.0–34.0)
MCHC: 34 g/dL (ref 30.0–36.0)
MCV: 91.7 fL (ref 80.0–100.0)
Platelets: 220 10*3/uL (ref 150–400)
RBC: 2.66 MIL/uL — ABNORMAL LOW (ref 4.22–5.81)
RDW: 20.9 % — ABNORMAL HIGH (ref 11.5–15.5)
WBC: 14.6 10*3/uL — ABNORMAL HIGH (ref 4.0–10.5)
nRBC: 0.3 % — ABNORMAL HIGH (ref 0.0–0.2)

## 2019-10-11 MED ORDER — ONDANSETRON HCL 4 MG/2ML IJ SOLN
4.0000 mg | Freq: Four times a day (QID) | INTRAMUSCULAR | Status: DC | PRN
  Administered 2019-10-11 – 2019-10-17 (×7): 4 mg via INTRAVENOUS
  Filled 2019-10-11 (×7): qty 2

## 2019-10-11 MED ORDER — OXYCODONE HCL 5 MG PO TABS
10.0000 mg | ORAL_TABLET | ORAL | Status: DC | PRN
  Administered 2019-10-11 – 2019-10-15 (×19): 15 mg via ORAL
  Filled 2019-10-11 (×19): qty 3

## 2019-10-11 MED ORDER — METHOCARBAMOL 500 MG PO TABS
1000.0000 mg | ORAL_TABLET | Freq: Three times a day (TID) | ORAL | Status: DC
  Administered 2019-10-11 – 2019-10-18 (×21): 1000 mg via ORAL
  Filled 2019-10-11 (×21): qty 2

## 2019-10-11 MED ORDER — PROMETHAZINE HCL 25 MG/ML IJ SOLN: 12.5000 mg | Freq: Four times a day (QID) | INTRAMUSCULAR | Status: DC | PRN

## 2019-10-11 MED ORDER — METHOCARBAMOL 750 MG PO TABS
750.0000 mg | ORAL_TABLET | Freq: Three times a day (TID) | ORAL | Status: DC
  Administered 2019-10-11: 750 mg via ORAL
  Filled 2019-10-11: qty 1

## 2019-10-11 MED ORDER — DOCUSATE SODIUM 100 MG PO CAPS
100.0000 mg | ORAL_CAPSULE | Freq: Two times a day (BID) | ORAL | Status: DC
  Administered 2019-10-11 – 2019-10-18 (×15): 100 mg via ORAL
  Filled 2019-10-11 (×15): qty 1

## 2019-10-11 MED ORDER — ACETAMINOPHEN 500 MG PO TABS
1000.0000 mg | ORAL_TABLET | Freq: Four times a day (QID) | ORAL | Status: DC
  Administered 2019-10-11 – 2019-10-18 (×24): 1000 mg via ORAL
  Filled 2019-10-11 (×27): qty 2

## 2019-10-11 MED ORDER — HYDROMORPHONE HCL 1 MG/ML IJ SOLN
1.0000 mg | INTRAMUSCULAR | Status: DC | PRN
  Administered 2019-10-11 – 2019-10-12 (×4): 1 mg via INTRAVENOUS
  Filled 2019-10-11 (×4): qty 1

## 2019-10-11 NOTE — Progress Notes (Signed)
PROGRESS NOTE    Patient: Alejandro Perez                            PCP: Patient, No Pcp Per                    DOB: 1976/11/19            DOA: 10/08/2019 [id number]             DOS: 10/11/2019, 10:26 AM   LOS: 3 days   Date of Service: The patient was seen and examined on 10/11/2019  Subjective:   Postop day #2 Status post splenomegaly by general surgery Dr. Magnus IvanBlackman on 10/09/2019  He tolerated procedure well. He continues to complain of excessive abdominal pain. He states he walked to the restroom this morning with significant worsening of his pain. His current pain regimen improves the pain significantly with the pain returning over 1-2 hours. He admits to chest tenderness just above his incision as well that is tender to palpation and painful on deep inspiration. No radiation of pain or other associated symptoms.   Brief Narrative/HPI:   Alejandro Perez is a 43 y.o. male with medical history significant of hypertension, pulmonary hypertension, sickle cell trait, spherocytosis, hemolytic anemia, splenomegaly, left upper quadrant abdominal pain.  Came with a chief complaint is from the hospital of severe pain.  He had also thrombocytopenia and anemia and was transfused with 1 unit of platelets and 1 unit of blood. Because of his pulmonary hypertension was recommended [facility].   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 Magnus IvanBlackman -Tolerated procedure, stable, surgical abdominal tenderness/pain -Advancing diet as tolerated  - [facility] - General surgery Dr. Lequita HaltMorgan, oncologist  [facility] -recommended splenomegaly Oncology already to vaccinated the patient with the new recommendation.  Abdominal pain -Postop diffuse moderate pain -Receiving 2 mg IV dilaudid every two hours as needed with as needed oxycodone noted as well. Pain control improved from yesterday although continues to have intermittent breakthrough pain.   Thrombocytopenia -Follow labs closely -Improved platelets 62->>148->>163->>220  Anemia- Secondary to sickle cell trait and spherocytosis Plan transfuse if H&H less than 7 -Hemoglobin dropped to 8.3 today without obvious bleeding. Will trend again in the AM  Essential hypertension Resume metoprolol and Catapres Norvasc and Lasix - BP remains elevated although likely related to pain. Will hold off on increasing anti-hypertensive regimen currently -While n.p.o. as needed hydralazine  Pulmonary hypertension -Monitoring closely, as needed Lasix, continue O2 supplements  Chronic kidney disease stage III Avoid nephrotoxins follow BUN and creatinine  Vitamin B 12 deficiency Vitamin B12 supplement  Nausea Continue zofran and promethazine as needed  DVT prophylaxis: scd  Code Status: Full code Family Communication:  Disposition Plan: Discharge home Consults called: No acute needs to be done Admission status: Full code     DVT prophylaxis: SCD/Compression stockings Code Status:   Code Status: Full Code Family Communication: No family member present at bedside The above findings and plan of care has  been discussed with patient in detail,  they expressed understanding and agreement of above. Disposition Plan:  >3 days Admission status:  NPATIENT  Consultants: General surgery  Procedures:     Splenomegaly 10/09/2019   Antimicrobials:  Anti-infectives (From admission, onward)   Start     Dose/Rate Route Frequency Ordered Stop   10/09/19 0915  ceFAZolin (ANCEF) IVPB 2g/100 mL premix     2 g 200 mL/hr  over 30 Minutes Intravenous  Once 10/08/19 1408 10/09/19 1157       Medication:  . acetaminophen  1,000 mg Oral Q6H  . amLODipine  10 mg Oral Daily  . Chlorhexidine Gluconate Cloth  6 each Topical Daily  . cloNIDine  0.1 mg Oral BID  . folic acid  1 mg Oral Daily  . gabapentin  300 mg Oral TID  . methocarbamol  750 mg Oral TID  . metoprolol tartrate  25 mg Oral BID  . sodium chloride flush  3 mL Intravenous Q12H  . cyanocobalamin  1,000 mcg Oral Daily    sodium chloride, alum & mag hydroxide-simeth, hydrALAZINE, HYDROmorphone (DILAUDID) injection, ipratropium-albuterol, ondansetron (ZOFRAN) IV, oxyCODONE, promethazine, sodium chloride flush, zolpidem   Objective:   Vitals:   10/10/19 1100 10/10/19 1420 10/10/19 2048 10/11/19 0524  BP: (!) 155/86 (!) 150/82 (!) 143/72 (!) 149/86  Pulse: 78 72 76 80  Resp: 18 19    Temp: 98.7 F (37.1 C) 98.8 F (37.1 C) 98.7 F (37.1 C) 97.9 F (36.6 C)  TempSrc: Oral Oral    SpO2: 100% 98% 100% 100%  Weight:      Height:        Intake/Output Summary (Last 24 hours) at 10/11/2019 1026 Last data filed at 10/11/2019 0902 Gross per 24 hour  Intake 3173.68 ml  Output 2225 ml  Net 948.68 ml   Filed Weights   10/08/19 0244 10/09/19 0930  Weight: 73.5 kg 73.5 kg     Examination:    Physical Exam  Constitution:  Alert, cooperative, no distress,  Psychiatric: Normal and stable mood and affect, cognition intact,   HEENT: Normocephalic, PERRL, otherwise with in Normal limits  Chest:Chest symmetric Cardio vascular:  S1/S2, RRR, No murmure, No Rubs or Gallops  pulmonary: Clear to auscultation bilaterally, respirations unlabored, negative wheezes / crackles Abdomen: Diffusely tender specifically around surgical wound area otherwise soft, non-distended, + bowel sounds,no masses, no organomegaly Muscular skeletal: able to move all 4 extremities, Normal muscle tone Extremities: No pitting edema lower extremities, +2 pulses  Skin: Dry,  warm to touch, negative for any Rashes, No open wounds Wounds: Surgical abdominal wound examined, dressing in place, mildly tender, negative erythema edema or drainage.  LABs:  CBC Latest Ref Rng & Units 10/11/2019 10/10/2019 10/10/2019  WBC 4.0 - 10.5 K/uL 14.6(H) 15.8(H) 16.4(H)  Hemoglobin 13.0 - 17.0 g/dL 8.3(L) 9.3(L) 9.5(L)  Hematocrit 39.0 - 52.0 % 24.4(L) 27.3(L) 26.7(L)  Platelets 150 - 400 K/uL 220 163 148(L)   CMP Latest Ref Rng & Units 10/11/2019 10/10/2019 10/09/2019  Glucose 70 - 99 mg/dL 98 127(H) 102(H)  BUN 6 - 20 mg/dL 12 18 18   Creatinine 0.61 - 1.24 mg/dL 1.36(H) 1.55(H) 1.36(H)  Sodium 135 - 145 mmol/L 136 133(L) 138  Potassium 3.5 - 5.1 mmol/L 3.9 4.1 3.8  Chloride 98 - 111 mmol/L 102 97(L) 102  CO2 22 - 32 mmol/L 27 25 27   Calcium 8.9 - 10.3 mg/dL 8.6(L) 8.7(L) 8.7(L)  Total Protein 6.5 - 8.1 g/dL - - -  Total Bilirubin 0.3 - 1.2 mg/dL - - -  Alkaline Phos 38 - 126 U/L - - -  AST 15 - 41 U/L - - -  ALT 0 - 44 U/L - - -        SIGNED: Pollyann Savoy, DO. [facility]

## 2019-10-11 NOTE — Progress Notes (Addendum)
    2 Days Post-Op  Subjective: CC: Tolerating a regular diet. Finished entire breakfast tray. Reports no flatus or bm since surgery. Had some increased pain in the RUQ after walking around yesterday. Voiding okay. Complains of bleeding from midline wound  Objective: Vital signs in last 24 hours: Temp:  [97.9 F (36.6 C)-98.8 F (37.1 C)] 97.9 F (36.6 C) (11/21 0524) Pulse Rate:  [72-80] 80 (11/21 0524) Resp:  [18-19] 19 (11/20 1420) BP: (143-155)/(72-86) 149/86 (11/21 0524) SpO2:  [98 %-100 %] 100 % (11/21 0524) Last BM Date: 10/08/19  Intake/Output from previous day: 11/20 0701 - 11/21 0700 In: 3116.7 [P.O.:759; I.V.:2357.7] Out: 2025 [Urine:2025] Intake/Output this shift: No intake/output data recorded.  PE: Gen:  Alert, NAD, pleasant Lungs: Normal rate and effort Abd: Soft, mild distension, tenderness of the RUQ that appears appropriate +BS. Midline wound with staples in place. Wound appears c/d/i without signs of bleeding.  Psych: A&Ox3  Skin: no rashes noted, warm and dry  Lab Results:  Recent Labs    10/10/19 0855 10/11/19 0309  WBC 15.8* 14.6*  HGB 9.3* 8.3*  HCT 27.3* 24.4*  PLT 163 220   BMET Recent Labs    10/10/19 0732 10/11/19 0309  NA 133* 136  K 4.1 3.9  CL 97* 102  CO2 25 27  GLUCOSE 127* 98  BUN 18 12  CREATININE 1.55* 1.36*  CALCIUM 8.7* 8.6*   PT/INR No results for input(s): LABPROT, INR in the last 72 hours. CMP     Component Value Date/Time   NA 136 10/11/2019 0309   K 3.9 10/11/2019 0309   CL 102 10/11/2019 0309   CO2 27 10/11/2019 0309   GLUCOSE 98 10/11/2019 0309   BUN 12 10/11/2019 0309   CREATININE 1.36 (H) 10/11/2019 0309   CALCIUM 8.6 (L) 10/11/2019 0309   PROT 5.9 (L) 10/08/2019 0524   ALBUMIN 4.1 10/08/2019 0524   AST 18 10/08/2019 0524   ALT 17 10/08/2019 0524   ALKPHOS 47 10/08/2019 0524   BILITOT 2.8 (H) 10/08/2019 0524   GFRNONAA >60 10/11/2019 0309   GFRAA >60 10/11/2019 0309   Lipase  No results found  for: LIPASE     Studies/Results: No results found.  Anti-infectives: Anti-infectives (From admission, onward)   Start     Dose/Rate Route Frequency Ordered Stop   10/09/19 0915  ceFAZolin (ANCEF) IVPB 2g/100 mL premix     2 g 200 mL/hr over 30 Minutes Intravenous  Once 10/08/19 1408 10/09/19 1157       Assessment/Plan Hereditary spherocytosis Anemia of chronic disease - see above Thrombocytopenia Pulmonary HTN Essential HTN CKD stage III Vitamin B12 deficiency  Hereditary spherocytosis Splenomegaly POD #2 - s/p splenectomy - Dr. Ninfa Linden - 10/09/2019  - Received vaccinations for splen[facility] at [facility]  - Will need Pneumococcal 23 valent in 8 weeks - hgb 8.3, plt 220 - Mobilize, IS  FEN - Reg VTE - SCDs ID - Ancef periop  Plan: Still taking IV pain medication. Will need to wean off before being able to go home. I have scheduled tylenol and increased robaxin. He is tolerating a regular diet, voiding on his own and mobilizing. Continue to follow hgb and plt.    LOS: 3 days    Jillyn Ledger , [facility] 10/11/2019, 7:56 AM Please see Amion for pager number during day hours 7:00am-4:30pm

## 2019-10-12 DIAGNOSIS — Z9081 Acquired absence of spleen: Secondary | ICD-10-CM

## 2019-10-12 LAB — BASIC METABOLIC PANEL
Anion gap: 7 (ref 5–15)
BUN: 8 mg/dL (ref 6–20)
CO2: 29 mmol/L (ref 22–32)
Calcium: 9 mg/dL (ref 8.9–10.3)
Chloride: 100 mmol/L (ref 98–111)
Creatinine, Ser: 1.29 mg/dL — ABNORMAL HIGH (ref 0.61–1.24)
GFR calc Af Amer: >60 mL/min (ref >60)
GFR calc non Af Amer: >60 mL/min (ref >60)
Glucose, Bld: 97 mg/dL (ref 70–99)
Potassium: 3.9 mmol/L (ref 3.5–5.1)
Sodium: 136 mmol/L (ref 135–145)

## 2019-10-12 LAB — CBC
HCT: 25.9 % — ABNORMAL LOW (ref 39.0–52.0)
Hemoglobin: 8.9 g/dL — ABNORMAL LOW (ref 13.0–17.0)
MCH: 31.8 pg (ref 26.0–34.0)
MCHC: 34.4 g/dL (ref 30.0–36.0)
MCV: 92.5 fL (ref 80.0–100.0)
Platelets: 372 10*3/uL (ref 150–400)
RBC: 2.8 MIL/uL — ABNORMAL LOW (ref 4.22–5.81)
RDW: 20.8 % — ABNORMAL HIGH (ref 11.5–15.5)
WBC: 16.1 10*3/uL — ABNORMAL HIGH (ref 4.0–10.5)
nRBC: 0.1 % (ref 0.0–0.2)

## 2019-10-12 MED ORDER — POLYETHYLENE GLYCOL 3350 17 G PO PACK
17.0000 g | PACK | Freq: Every day | ORAL | Status: DC
  Administered 2019-10-13: 17 g via ORAL
  Filled 2019-10-12 (×2): qty 1

## 2019-10-12 MED ORDER — CALCIUM CARBONATE ANTACID 500 MG PO CHEW
1.0000 | CHEWABLE_TABLET | Freq: Three times a day (TID) | ORAL | Status: DC | PRN
  Administered 2019-10-12 – 2019-10-17 (×6): 200 mg via ORAL
  Filled 2019-10-12 (×6): qty 1

## 2019-10-12 MED ORDER — HYDROMORPHONE HCL 1 MG/ML IJ SOLN
1.0000 mg | INTRAMUSCULAR | Status: DC | PRN
  Administered 2019-10-12 – 2019-10-13 (×5): 1 mg via INTRAVENOUS
  Filled 2019-10-12 (×5): qty 1

## 2019-10-12 MED ORDER — TRAMADOL HCL 50 MG PO TABS
50.0000 mg | ORAL_TABLET | Freq: Four times a day (QID) | ORAL | Status: DC
  Administered 2019-10-12 – 2019-10-13 (×4): 50 mg via ORAL
  Filled 2019-10-12 (×5): qty 1

## 2019-10-12 MED ORDER — CLONIDINE HCL 0.2 MG PO TABS
0.2000 mg | ORAL_TABLET | Freq: Two times a day (BID) | ORAL | Status: DC
  Administered 2019-10-12 – 2019-10-18 (×13): 0.2 mg via ORAL
  Filled 2019-10-12 (×13): qty 1

## 2019-10-12 NOTE — Progress Notes (Signed)
PROGRESS NOTE    Patient: Alejandro Perez                            PCP: Patient, No Pcp Per                    DOB: 20-Aug-1976            DOA: 10/08/2019 [id number]:[id number]             DOS: 10/12/2019, 11:09 AM   LOS: 4 days   Date of Service: The patient was seen and examined on 10/12/2019  Subjective:   Postop day #3 Status post splenomegaly by general surgery Dr. Magnus Ivan on 10/09/2019. He reports improvement in pain control and was able to ambulate well yesterday however he is hesitant to walk today due to the pain from walking yesterday. Encouraged to remain ambulatory.  He reports that he is continuing to require IV dilaudid for pain control and will try to limit this today. He denies any N/V/D, fever, chills, SOB.   Brief Narrative/HPI:   Alejandro Perez is a 43 y.o. male with medical history significant of hypertension, pulmonary hypertension, sickle cell trait, spherocytosis, hemolytic anemia, splenomegaly, left upper quadrant abdominal pain.  Came with a chief complaint is from the hospital of severe pain.  He had also thrombocytopenia and anemia and was transfused with 1 unit of platelets and 1 unit of blood. Because of his pulmonary hypertension was recommended to have the surgery  at [facility] hospital.   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 Magnus Ivan -Tolerated procedure, stable, surgical abdominal tenderness/pain -Advancing diet as tolerated  - [facility]-Ferd from [facility] -for splenomegaly - General surgery Dr. Lequita Halt, oncologist Dr. Gilman Buttner at [facility] evaluated -recommended splenomegaly Oncology already to vaccinated the patient  with the new recommendation. - Will need pneumococcal 23 in 8 weeks  Abdominal pain -Postop diffuse moderate pain -Receiving 2 mg IV dilaudid every two hours as needed with as needed oxycodone noted as well. Pain control improved from yesterday although continues to have intermittent breakthrough pain.   Thrombocytopenia -Follow labs closely -Improved platelets 62->>148->>163->>220>>372  Anemia- Secondary to sickle cell trait and spherocytosis Plan transfuse if H&H less than 7 -Hemoglobin improved to 8.9  Essential hypertension Resume metoprolol and Catapres Norvasc and Lasix - BP remains elevated although likely related to pain. Increasing catapres to 0.2 BID -While n.p.o. as needed hydralazine  Pulmonary hypertension -Monitoring closely, as needed Lasix, continue O2 supplements  Chronic kidney disease stage III Avoid nephrotoxins follow BUN and creatinine  Vitamin B 12 deficiency Vitamin B12 supplement  Nausea Continue zofran and promethazine as needed  DVT prophylaxis: scd  Code Status: Full code Family Communication:  Disposition Plan: Discharge home Consults called: No acute needs to be done Admission status: Full code     DVT prophylaxis: SCD/Compression stockings Code Status:   Code Status: Full Code Family Communication: No family member present at bedside The above findings and plan of care has been discussed with patient in detail,  they expressed understanding and agreement of above. Disposition  Plan:  >3 days Admission status:  NPATIENT  Consultants: General surgery  Procedures:     Splenomegaly 10/09/2019   Antimicrobials:  Anti-infectives (From admission, onward)   Start     Dose/Rate Route Frequency Ordered Stop   10/09/19 0915  ceFAZolin (ANCEF) IVPB 2g/100 mL premix     2 g 200 mL/hr over 30 Minutes Intravenous  Once 10/08/19 1408 10/09/19 1157       Medication:  . acetaminophen  1,000 mg Oral Q6H  . amLODipine  10 mg  Oral Daily  . cloNIDine  0.2 mg Oral BID  . docusate sodium  100 mg Oral BID  . folic acid  1 mg Oral Daily  . gabapentin  300 mg Oral TID  . methocarbamol  1,000 mg Oral TID  . metoprolol tartrate  25 mg Oral BID  . polyethylene glycol  17 g Oral Daily  . sodium chloride flush  3 mL Intravenous Q12H  . traMADol  50 mg Oral Q6H  . cyanocobalamin  1,000 mcg Oral Daily    sodium chloride, calcium carbonate, hydrALAZINE, HYDROmorphone (DILAUDID) injection, ipratropium-albuterol, ondansetron (ZOFRAN) IV, oxyCODONE, promethazine, sodium chloride flush, zolpidem   Objective:   Vitals:   10/11/19 0524 10/11/19 1345 10/11/19 2037 10/12/19 0539  BP: (!) 149/86 (!) 150/93 (!) 159/88 (!) 152/98  Pulse: 80 76 79 72  Resp:  20 18 17   Temp: 97.9 F (36.6 C) 98.8 F (37.1 C) 98.7 F (37.1 C) 98.4 F (36.9 C)  TempSrc:  Oral Oral Oral  SpO2: 100% 99% 99% 99%  Weight:      Height:        Intake/Output Summary (Last 24 hours) at 10/12/2019 1109 Last data filed at 10/12/2019 1053 Gross per 24 hour  Intake 1160.61 ml  Output 5050 ml  Net -3889.39 ml   Filed Weights   10/08/19 0244 10/09/19 0930  Weight: 73.5 kg 73.5 kg     Examination:    Physical Exam  Constitution:  Alert, cooperative, no distress,  Psychiatric: Normal and stable mood and affect, cognition intact,   HEENT: Normocephalic, PERRL, otherwise with in Normal limits  Chest:Chest symmetric Cardio vascular:  S1/S2, RRR, No murmure, No Rubs or Gallops  pulmonary: Clear to auscultation bilaterally, respirations unlabored, negative wheezes / crackles Abdomen: Diffusely tender specifically around surgical wound area otherwise soft, non-distended, + bowel sounds,no masses, no organomegaly Muscular skeletal: able to move all 4 extremities, Normal muscle tone Extremities: No pitting edema lower extremities, +2 pulses  Skin: Dry, warm to touch, negative for any Rashes, No open wounds Wounds: Surgical abdominal wound  examined, dressing in place, mildly tender, negative erythema edema or drainage.  LABs:  CBC Latest Ref Rng & Units 10/12/2019 10/11/2019 10/10/2019  WBC 4.0 - 10.5 K/uL 16.1(H) 14.6(H) 15.8(H)  Hemoglobin 13.0 - 17.0 g/dL 9.6(E8.9(L) 8.3(L) 9.3(L)  Hematocrit 39.0 - 52.0 % 25.9(L) 24.4(L) 27.3(L)  Platelets 150 - 400 K/uL 372 220 163   CMP Latest Ref Rng & Units 10/12/2019 10/11/2019 10/10/2019  Glucose 70 - 99 mg/dL 97 98 454(U127(H)  BUN 6 - 20 mg/dL 8 12 18   Creatinine 0.61 - 1.24 mg/dL 9.81(X1.29(H) 9.14(N1.36(H) 8.29(F1.55(H)  Sodium 135 - 145 mmol/L 136 136 133(L)  Potassium 3.5 - 5.1 mmol/L 3.9 3.9 4.1  Chloride 98 - 111 mmol/L 100 102 97(L)  CO2 22 - 32 mmol/L 29 27 25   Calcium 8.9 - 10.3 mg/dL 9.0 6.2(Z8.6(L) 3.0(Q8.7(L)  Total Protein 6.5 - 8.1 g/dL - - -  Total  Bilirubin 0.3 - 1.2 mg/dL - - -  Alkaline Phos 38 - 126 U/L - - -  AST 15 - 41 U/L - - -  ALT 0 - 44 U/L - - -        SIGNED: Arlan Organ, DO. [facility]

## 2019-10-12 NOTE — Progress Notes (Signed)
3 Days Post-Op  Subjective: CC: Patient reports that he had some nausea after having a big breakfast yesterday.  No emesis.  He has not had any nausea since then.  He did have some reflux last night.  Reports his abdominal pain is about the same or better as yesterday.  He mainly feels some fullness/pressure in his upper abdomen on the left.  Pain seems to worsen when he walks around but he is walking in the halls.  He still taking IV pain medication.  He has not passed flatus or had a BM.  Objective: Vital signs in last 24 hours: Temp:  [98.4 F (36.9 C)-98.8 F (37.1 C)] 98.4 F (36.9 C) (11/22 0539) Pulse Rate:  [72-79] 72 (11/22 0539) Resp:  [17-20] 17 (11/22 0539) BP: (150-159)/(88-98) 152/98 (11/22 0539) SpO2:  [99 %] 99 % (11/22 0539) Last BM Date: 10/09/19  Intake/Output from previous day: 11/21 0701 - 11/22 0700 In: 1120.6 [P.O.:984; I.V.:136.6] Out: 4550 [Urine:4550] Intake/Output this shift: No intake/output data recorded.  PE: Gen:  Alert, NAD, pleasant Lungs: Normal rate and effort Abd: Soft, mild distension, tenderness of the LUQ that appears appropriate +BS. Midline wound with staples in place. Wound appears c/d/i without signs of bleeding.  Psych: A&Ox3  Skin: no rashes noted, warm and dry  Lab Results:  Recent Labs    10/11/19 0309 10/12/19 0718  WBC 14.6* 16.1*  HGB 8.3* 8.9*  HCT 24.4* 25.9*  PLT 220 372   BMET Recent Labs    10/11/19 0309 10/12/19 0718  NA 136 136  K 3.9 3.9  CL 102 100  CO2 27 29  GLUCOSE 98 97  BUN 12 8  CREATININE 1.36* 1.29*  CALCIUM 8.6* 9.0   PT/INR No results for input(s): LABPROT, INR in the last 72 hours. CMP     Component Value Date/Time   NA 136 10/12/2019 0718   K 3.9 10/12/2019 0718   CL 100 10/12/2019 0718   CO2 29 10/12/2019 0718   GLUCOSE 97 10/12/2019 0718   BUN 8 10/12/2019 0718   CREATININE 1.29 (H) 10/12/2019 0718   CALCIUM 9.0 10/12/2019 0718   PROT 5.9 (L) 10/08/2019 0524   ALBUMIN  4.1 10/08/2019 0524   AST 18 10/08/2019 0524   ALT 17 10/08/2019 0524   ALKPHOS 47 10/08/2019 0524   BILITOT 2.8 (H) 10/08/2019 0524   GFRNONAA >60 10/12/2019 0718   GFRAA >60 10/12/2019 0718   Lipase  No results found for: LIPASE     Studies/Results: No results found.  Anti-infectives: Anti-infectives (From admission, onward)   Start     Dose/Rate Route Frequency Ordered Stop   10/09/19 0915  ceFAZolin (ANCEF) IVPB 2g/100 mL premix     2 g 200 mL/hr over 30 Minutes Intravenous  Once 10/08/19 1408 10/09/19 1157       Assessment/Plan Hereditary spherocytosis Anemia of chronic disease - see above Thrombocytopenia Pulmonary HTN Essential HTN CKD stage III Vitamin B12 deficiency  Hereditary spherocytosis Splenomegaly POD #3 - s/p splenectomy - Dr. Magnus Ivan - 10/09/2019  - Received vaccinations for splenectomy at [facility]  - Will need Pneumococcal 23 valent in 8 weeks - hgb stable 8.9, plt to 372 - Mobilize, IS - Wean IV pain medication.   FEN - Reg VTE - SCDs ID - Ancef periop  Plan: I suspect the patient will be able to go home early in the week. He is voiding well, tolerating diet, ambulating well, vital signs stable, hgb stable, plt  normalized, incisions c/d/i. He is still taking IV pain medication.  We had another long discussion today about transitioning to oral pain medication.  I I have already scheduled Tylenol and Robaxin at their max doses. He is on gabapentin 300mg  tid. Will add Ultram q6hrs.  He reports he has taken this in the past with side effects. Still awaiting ROBF.   LOS: 4 days    Jillyn Ledger , [facility] 10/12/2019, 8:40 AM Please see Amion for pager number during day hours 7:00am-4:30pm

## 2019-10-12 NOTE — Plan of Care (Signed)

## 2019-10-13 ENCOUNTER — Inpatient Hospital Stay (HOSPITAL_COMMUNITY): Payer: Self-pay

## 2019-10-13 DIAGNOSIS — R110 Nausea: Secondary | ICD-10-CM

## 2019-10-13 DIAGNOSIS — R0602 Shortness of breath: Secondary | ICD-10-CM

## 2019-10-13 LAB — BASIC METABOLIC PANEL
Anion gap: 11 (ref 5–15)
BUN: 8 mg/dL (ref 6–20)
CO2: 29 mmol/L (ref 22–32)
Calcium: 9.4 mg/dL (ref 8.9–10.3)
Chloride: 95 mmol/L — ABNORMAL LOW (ref 98–111)
Creatinine, Ser: 1.38 mg/dL — ABNORMAL HIGH (ref 0.61–1.24)
GFR calc Af Amer: >60 mL/min (ref >60)
GFR calc non Af Amer: >60 mL/min (ref >60)
Glucose, Bld: 99 mg/dL (ref 70–99)
Potassium: 4.8 mmol/L (ref 3.5–5.1)
Sodium: 135 mmol/L (ref 135–145)

## 2019-10-13 LAB — CBC
HCT: 33.5 % — ABNORMAL LOW (ref 39.0–52.0)
Hemoglobin: 11.3 g/dL — ABNORMAL LOW (ref 13.0–17.0)
MCH: 31.3 pg (ref 26.0–34.0)
MCHC: 33.7 g/dL (ref 30.0–36.0)
MCV: 92.8 fL (ref 80.0–100.0)
Platelets: 527 10*3/uL — ABNORMAL HIGH (ref 150–400)
RBC: 3.61 MIL/uL — ABNORMAL LOW (ref 4.22–5.81)
RDW: 19.8 % — ABNORMAL HIGH (ref 11.5–15.5)
WBC: 20.9 10*3/uL — ABNORMAL HIGH (ref 4.0–10.5)
nRBC: 0.1 % (ref 0.0–0.2)

## 2019-10-13 LAB — BLOOD GAS, ARTERIAL
Acid-Base Excess: 4.6 mmol/L — ABNORMAL HIGH (ref 0.0–2.0)
Bicarbonate: 29.8 mmol/L — ABNORMAL HIGH (ref 20.0–28.0)
Drawn by: 350431
FIO2: 28
O2 Saturation: 88.2 %
Patient temperature: 37
pCO2 arterial: 54.3 mmHg — ABNORMAL HIGH (ref 32.0–48.0)
pH, Arterial: 7.358 (ref 7.350–7.450)
pO2, Arterial: 59.3 mmHg — ABNORMAL LOW (ref 83.0–108.0)

## 2019-10-13 IMAGING — DX DG CHEST 1V PORT
2 series · 2 of 2 positions shown · non-contrast
Comparison: Portable chest [DATE] and earlier.

CLINICAL DATA: 43-year-old male with shortness of breath and
worsening oxygenation.

EXAM:
PORTABLE CHEST 1 VIEW

[chest ap (1 of 2)]
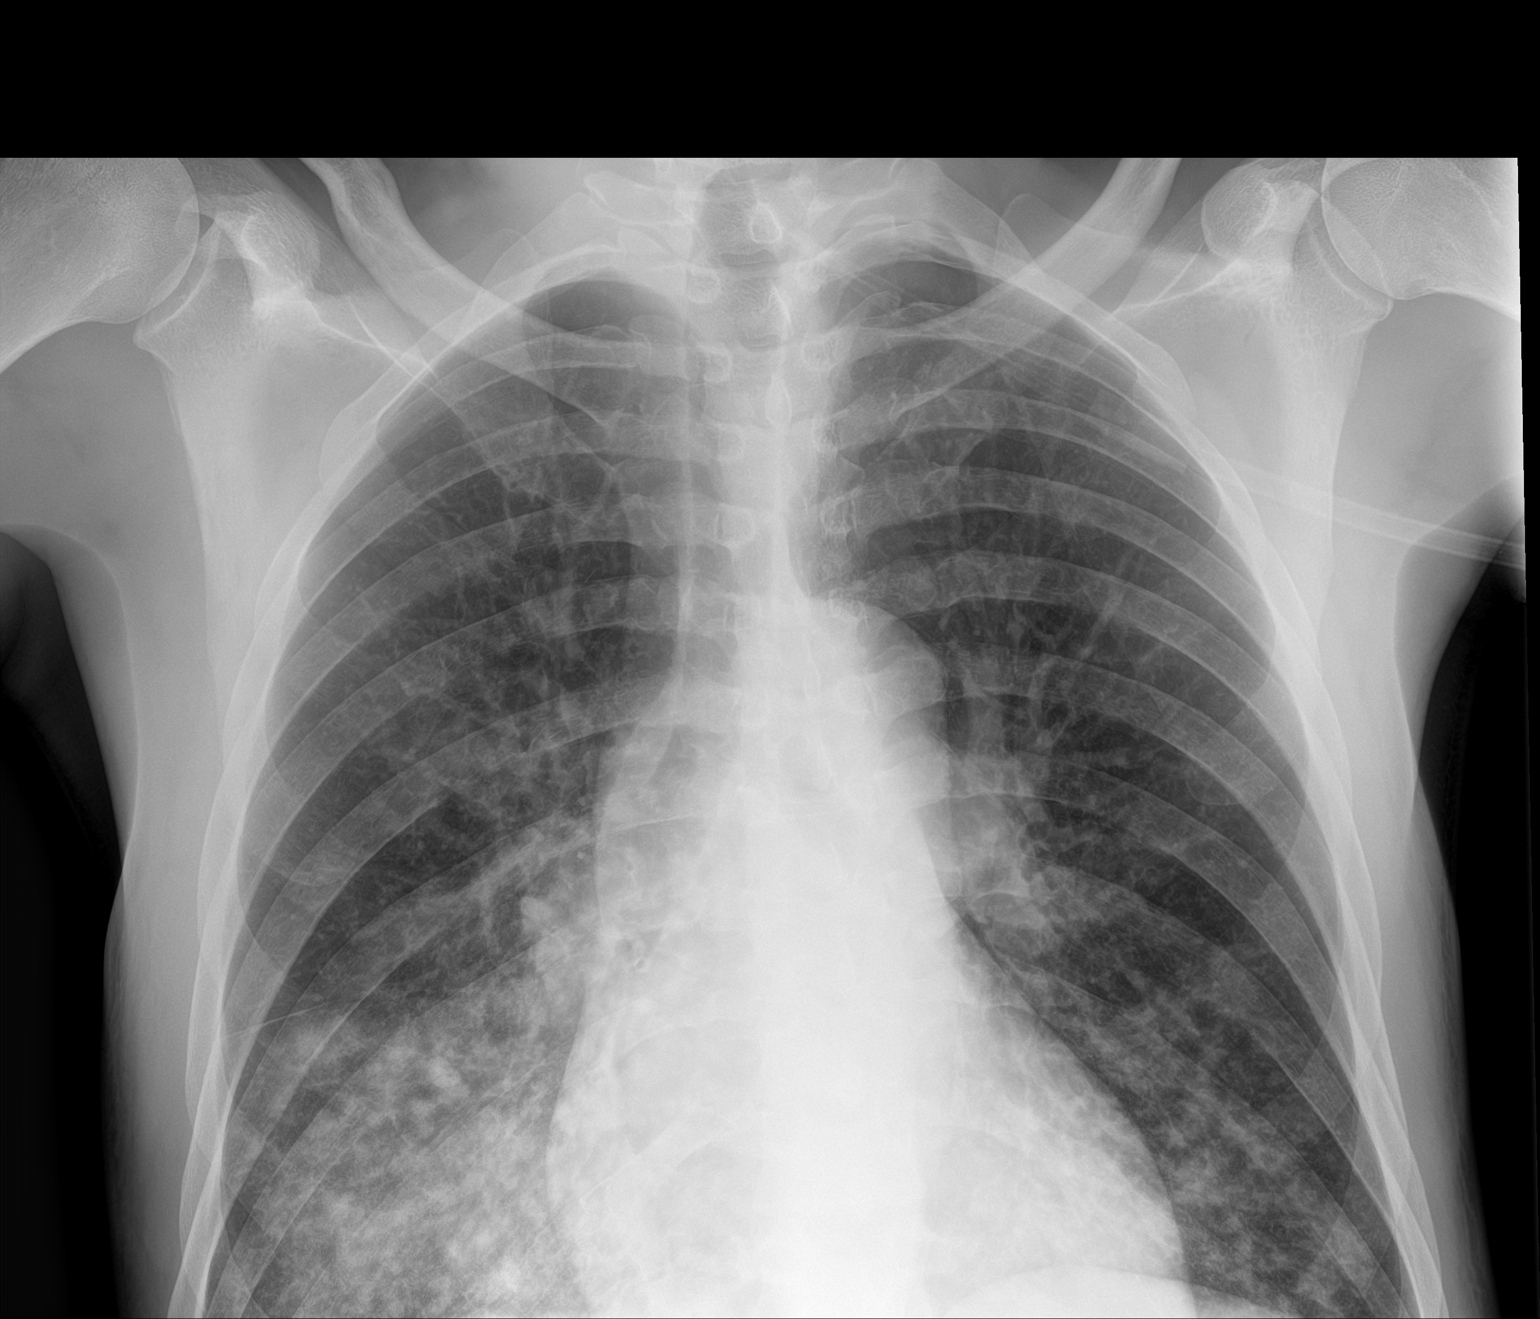

[chest ap (2 of 2)]
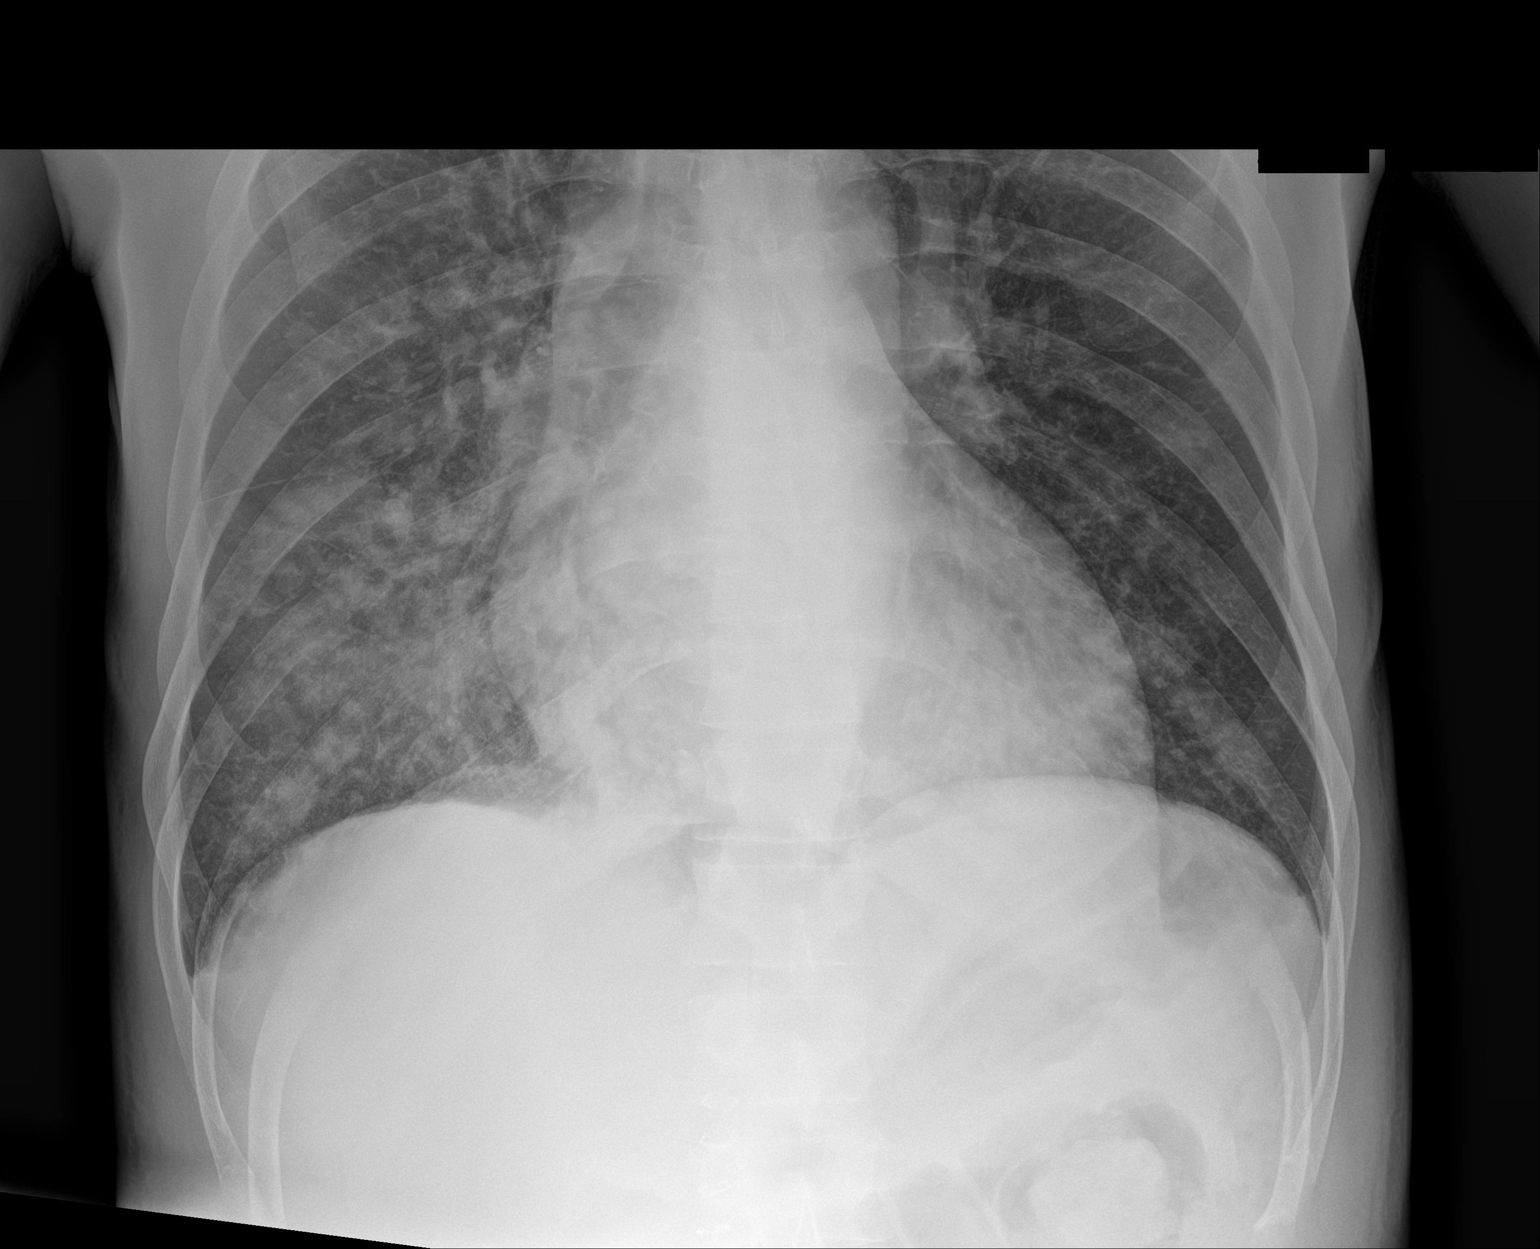

[2 of 2 positions shown; findings below may reference images not displayed]

FINDINGS: Portable AP semi upright view at [GQ] hours. Confluent new opacity
in the right lower lung, approaching consolidation. It is unclear
whether this is in the middle lobe or the lower lobe. Stable lung
volumes. Background increased pulmonary vascularity. Mediastinal
contours are stable, borderline to mild cardiomegaly. No
pneumothorax or pleural effusion. Visualized tracheal air column is
within normal limits. No acute osseous abnormality identified.
Negative visible bowel gas pattern.
IMPRESSION: 1. Progressive right lung base opacity since the CT on [DATE].
Top differential considerations include pneumonia and asymmetric
pulmonary edema.
2. Superimposed increased pulmonary vascularity. No pleural
effusion.

## 2019-10-13 MED ORDER — HYDROMORPHONE HCL 1 MG/ML IJ SOLN
1.0000 mg | INTRAMUSCULAR | Status: DC | PRN
  Administered 2019-10-13 – 2019-10-15 (×10): 1 mg via INTRAVENOUS
  Filled 2019-10-13 (×9): qty 1

## 2019-10-13 MED ORDER — HYDROMORPHONE HCL 1 MG/ML IJ SOLN
INTRAMUSCULAR | Status: AC
  Filled 2019-10-13: qty 1

## 2019-10-13 MED ORDER — BOOST / RESOURCE BREEZE PO LIQD CUSTOM
1.0000 | Freq: Three times a day (TID) | ORAL | Status: DC
  Administered 2019-10-13: 1 via ORAL

## 2019-10-13 MED ORDER — METOCLOPRAMIDE HCL 5 MG/ML IJ SOLN
10.0000 mg | Freq: Four times a day (QID) | INTRAMUSCULAR | Status: DC
  Administered 2019-10-13 – 2019-10-18 (×17): 10 mg via INTRAVENOUS
  Filled 2019-10-13 (×18): qty 2

## 2019-10-13 MED ORDER — MAGIC MOUTHWASH W/LIDOCAINE
15.0000 mL | Freq: Three times a day (TID) | ORAL | Status: DC
  Administered 2019-10-13 – 2019-10-18 (×11): 15 mL via ORAL
  Filled 2019-10-13 (×17): qty 15

## 2019-10-13 MED ORDER — SODIUM CHLORIDE 0.9 % IV SOLN
3.0000 g | Freq: Four times a day (QID) | INTRAVENOUS | Status: DC
  Administered 2019-10-13 – 2019-10-18 (×17): 3 g via INTRAVENOUS
  Filled 2019-10-13 (×5): qty 3
  Filled 2019-10-13: qty 8
  Filled 2019-10-13 (×2): qty 3
  Filled 2019-10-13: qty 8
  Filled 2019-10-13 (×9): qty 3
  Filled 2019-10-13 (×4): qty 8
  Filled 2019-10-13: qty 3

## 2019-10-13 NOTE — TOC Initial Note (Signed)
Transition of Care [facility]) - Initial/Assessment Note    Patient Details  Name: Alejandro Perez MRN: [id number] Date of Birth: 23-Nov-1975  Transition of Care [facility]) CM/SW Contact:    Marilu Favre, RN Phone Number: 10/13/2019, 3:36 PM  Clinical Narrative:                 Patient from home , plans to return to [address] [place], [place] [address]. Has PCP Dr Marcina Millard. Will follow for medication assistance at discharge.  Expected Discharge Plan: Home/Self Care Barriers to Discharge: Continued Medical Work up   Patient Goals and CMS Choice Patient states their goals for this hospitalization and ongoing recovery are:: to go home CMS Medicare.gov Compare Post Acute Care list provided to:: Patient Choice offered to / list presented to : Patient  Expected Discharge Plan and Services Expected Discharge Plan: Home/Self Care   Discharge Planning Services: CM Consult, [place] Program, Medication Assistance   Living arrangements for the past 2 months: Single Family Home                 DME Arranged: N/A         HH Arranged: NA          Prior Living Arrangements/Services Living arrangements for the past 2 months: Single Family Home Lives with:: Domestic Partner Patient language and need for interpreter reviewed:: Yes Do you feel safe going back to the place where you live?: Yes      Need for Family Participation in Patient Care: Yes (Comment) Care giver support system in place?: Yes (comment)   Criminal Activity/Legal Involvement Pertinent to Current Situation/Hospitalization: No - Comment as needed  Activities of Daily Living Home Assistive Devices/Equipment: None ADL Screening (condition at time of admission) Patient's cognitive ability adequate to safely complete daily activities?: Yes Is the patient deaf or have difficulty hearing?: No Does the patient have difficulty seeing, even when wearing glasses/contacts?: No Does the patient have difficulty concentrating,  remembering, or making decisions?: No Patient able to express need for assistance with ADLs?: Yes Does the patient have difficulty dressing or bathing?: No Independently performs ADLs?: Yes (appropriate for developmental age) Does the patient have difficulty walking or climbing stairs?: Yes(sometimes when climbing stairs) Weakness of Legs: None Weakness of Arms/Hands: None  Permission Sought/Granted   Permission granted to share information with : No              Emotional Assessment Appearance:: Appears stated age Attitude/Demeanor/Rapport: Engaged Affect (typically observed): Accepting Orientation: : Oriented to Self, Oriented to Place, Oriented to  Time, Oriented to Situation Alcohol / Substance Use: Not Applicable Psych Involvement: No (comment)  Admission diagnosis:  ABD PAIN Patient Active Problem List   Diagnosis Date Noted  . 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 Nausea 10/11/2019  . Splenomegaly 10/08/2019  . Vitamin B12 deficiency   . Pulmonary HTN ([place])   . Chest pain 12/21/2017  . Thrombocytopenia ([place]) 12/21/2017  . Spleen enlarged 12/21/2017  . Schistocytes on peripheral blood smear 12/21/2017  . Hypertension   . Anemia   . 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 Abdominal pain, RUQ    PCP:  Patient, No Pcp Per Pharmacy:   [id number] - [place], [place] [address] Phone: [phone] Fax: [phone]     Social Determinants of Health (SDOH) Interventions    Readmission Risk Interventions No flowsheet data found.

## 2019-10-13 NOTE — Progress Notes (Signed)
Patient has refused to get out of bed today. He is feeling very emotional. Using IS breathing machine up to 2500

## 2019-10-13 NOTE — Progress Notes (Signed)
Called to patient room by RN.  RN stated that she went in to check on him and his sat was low (80s), she said it had even dropped down into upper 70s.  Went to check out patient.  BS we clear and diminished and more diminished on RLL, but patient said he was hurting on the left side.  RN placed patient on 2L [place] and he was at 90% or above.  I drew an ABG per MD order and a CXR was done per MD order.  I also told RN that she could take patient up to 3L.  Patient does have improvement in sat if he takes nice deep breaths in and out and relaxes.  Will continue to monitor.

## 2019-10-13 NOTE — Progress Notes (Signed)
[facility]/Trauma Progress Note  4 Days Post-Op   Assessment/Plan Hereditary spherocytosis Anemia of chronic disease - see above Thrombocytopenia Pulmonary HTN Essential HTN CKD stage III Vitamin B12 deficiency  Hereditary spherocytosis Splenomegaly - s/p splenectomy - Dr. Ninfa Linden - 10/09/2019 -Received vaccinations for splen[facility] at [facility] - Will need Pneumococcal 23 valent in 8 weeks - hgbstable 11.3, plt to 572 -Mobilize, IS - Wean IV pain medication.   FEN -Reg VTE -SCDs, okay for lovenox from our standpoint but will defer to medicine.  ID -Ancef periop  Plan: possible PNA seen on CXR this am per medicine. Pt needs to eat more. Added boost. Needs to ambulate.     LOS: 5 days    Subjective: CC: abdominal pain  Pt states continued LUQ abdominal pain. He did not walk yesterday. He did not eat anything but ice cream yesterday cause he states food makes his pain worse. He has had flatus and no nausea or vomiting. He had bloody sputum overnight and stated difficulty breathing. No fevers.   Objective: Vital signs in last 24 hours: Temp:  [97.7 F (36.5 C)-98.8 F (37.1 C)] 98.8 F (37.1 C) (11/23 0342) Pulse Rate:  [67-77] 77 (11/23 0342) Resp:  [18-20] 18 (11/23 0342) BP: (138-154)/(84-95) 149/89 (11/23 0342) SpO2:  [88 %-100 %] 97 % (11/23 0343) Last BM Date: 10/09/19  Intake/Output from previous day: 11/22 0701 - 11/23 0700 In: 220 [P.O.:220] Out: 3075 [Urine:3075] Intake/Output this shift: No intake/output data recorded.  PE:  Gen:  Alert, NAD, pleasant, cooperative Pulm:  Rate and effort normal Abd: Soft,no distension, tenderness of the LUQ, +BS. Midline wound with staples in place. No signs of infection.  Psych: A&Ox3  Skin: no rashes noted, warm and dry Extremities: no TTP or swelling to calves BL   Anti-infectives: Anti-infectives (From admission, onward)   Start     Dose/Rate Route Frequency Ordered Stop   10/09/19 0915  ceFAZolin (ANCEF) IVPB 2g/100 mL premix     2 g 200 mL/hr over 30 Minutes Intravenous  Once 10/08/19 1408 10/09/19 1157      Lab Results:  Recent Labs    10/12/19 0718 10/13/19 0542  WBC 16.1* 20.9*  HGB 8.9* 11.3*  HCT 25.9* 33.5*  PLT 372 527*   BMET Recent Labs    10/12/19 0718 10/13/19 0542  NA 136 135  K 3.9 4.8  CL 100 95*  CO2 29 29  GLUCOSE 97 99  BUN 8 8  CREATININE 1.29* 1.38*  CALCIUM 9.0 9.4   PT/INR No results for input(s): LABPROT, INR in the last 72 hours. CMP     Component Value Date/Time   NA 135 10/13/2019 0542   K 4.8 10/13/2019 0542   CL 95 (L) 10/13/2019 0542   CO2 29 10/13/2019 0542   GLUCOSE 99 10/13/2019 0542   BUN 8 10/13/2019 0542   CREATININE 1.38 (H) 10/13/2019 0542   CALCIUM 9.4 10/13/2019 0542   PROT 5.9 (L) 10/08/2019 0524   ALBUMIN 4.1 10/08/2019 0524   AST 18 10/08/2019 0524   ALT 17 10/08/2019 0524   ALKPHOS 47 10/08/2019 0524   BILITOT 2.8 (H) 10/08/2019 0524   GFRNONAA >60 10/13/2019 0542   GFRAA >60 10/13/2019 0542   Lipase  No results found for: LIPASE  Studies/Results: Dg Chest Port 1 View  Result Date: 10/13/2019 CLINICAL DATA:  43 year old male with shortness of breath and worsening oxygenation. EXAM: PORTABLE CHEST 1 VIEW COMPARISON:  Portable chest 10/04/2019 and earlier. FINDINGS: Portable  AP semi upright view at 0407 hours. Confluent new opacity in the right lower lung, approaching consolidation. It is unclear whether this is in the middle lobe or the lower lobe. Stable lung volumes. Background increased pulmonary vascularity. Mediastinal contours are stable, borderline to mild cardiomegaly. No pneumothorax or pleural effusion. Visualized tracheal air column is within normal limits. No acute osseous abnormality identified. Negative visible bowel gas pattern. IMPRESSION: 1. Progressive right lung base opacity since the CT on 10/01/2019. Top differential considerations include pneumonia and asymmetric  pulmonary edema. 2. Superimposed increased pulmonary vascularity. No pleural effusion. Electronically Signed   By: Odessa Fleming M.D.   On: 10/13/2019 04:23     Jerre Simon, [facility] Please see amion for pager for the following: [place], & Friday 7:00am - 4:30pm Thursdays 7:00am -11:30am

## 2019-10-13 NOTE — Progress Notes (Signed)
Pharmacy Antibiotic Note  Alejandro Perez is a 43 y.o. male admitted on 10/08/2019 with abdominal pain, now with concern for aspiration pna.  Pharmacy has been consulted for Unasyn dosing.  SCr CrCl ~70 ml/min  Plan: Unasyn 3g IV every 6 hours Monitor renal function, LOT  Height: 6\' 2"  (188 cm) Weight: 162 lb (73.5 kg) IBW/kg (Calculated) : 82.2  Temp (24hrs), Avg:98.7 F (37.1 C), Min:97.7 F (36.5 C), Max:99.5 F (37.5 C)  Recent Labs  Lab 10/09/19 0329 10/10/19 0732 10/10/19 0855 10/11/19 0309 10/12/19 0718 10/13/19 0542  WBC 8.4 16.4* 15.8* 14.6* 16.1* 20.9*  CREATININE 1.36* 1.55*  --  1.36* 1.29* 1.38*    Estimated Creatinine Clearance: 70.3 mL/min (A) (by C-G formula based on SCr of 1.38 mg/dL (H)).    Allergies  Allergen Reactions  . Morphine And Related Other (See Comments)    Severe abdominal pain    Bertis Ruddy, PharmD Clinical Pharmacist Please check AMION for all [place] numbers 10/13/2019 7:19 PM

## 2019-10-13 NOTE — Progress Notes (Addendum)
Pt has not slept any this shift has been on phone most of night.. Pt very anxious, has frequently called nurses station for coke, ice, pain meds. OOB to BR. No BM but passed flatus. Will continue to monitor.

## 2019-10-13 NOTE — Progress Notes (Signed)
Patient refuses to ambulate in hall but is walking in room.

## 2019-10-13 NOTE — Progress Notes (Addendum)
PROGRESS NOTE    Alejandro Perez  IRC:[id number] DOB: [birth year] DOA: 10/08/2019 PCP: Patient, No Pcp Per   Brief Narrative:  Alejandro Perez a 43 y.o.malewith medical history significant ofhypertension, pulmonary hypertension, sickle cell trait, spherocytosis, hemolytic anemia, splenomegaly, left upper quadrant abdominal pain. Came with a chief complaint is from the hospital of severe pain. He had also thrombocytopenia and anemia and was transfused with 1 unit of platelets and 1 unit of blood. Because of his pulmonary hypertension was recommended to have the surgery at [facility].  Subjective: Patient was complaining of some pain around surgery site.  He did not had any bowel movement and also complaining about some oral thrush. Patient apparently had some pleuritic chest pain and shortness of breath earlier in the morning prompted ABG and chest x-ray.  Chest pain and shortness of breath has been improved now.  Assessment & Plan:   Active Problems:   Chest pain   Thrombocytopenia (HCC)   Hypertension   Spleen enlarged   Anemia   Schistocytes on peripheral blood smear   Abdominal pain, RUQ   Vitamin B12 deficiency   Pulmonary HTN (HCC)   Splenomegaly   Nausea   S/P splenectomy during current hospitalization  Splenomegaly -secondary to hereditary spherocytosis with hemolytic anemia. S/p splenectomy on 11/19. Postop day 4. Had his splenectomy by Dr. Magnus Ivan due to severe splenomegaly. Oncology already to vaccinated the patient with the new recommendation. - Will need pneumococcal 23 in 8 weeks. Patient continued to experience postop constipation. -We will give MiraLAX. -Surgical wound care-Per surgery. -Continue pain management.  Aspiration pneumonia/shortness of breath.  Patient has a repeat chest x-ray which shows a new right basilar opacity.  He was experiencing some shortness of breath and pleuritic type chest pain earlier.  And afebrile with worsening leukocytosis.  -Start him on Unasyn.  Oral thrush. -Magic mouthwash with lidocaine 3 times a day.  Thrombocytopenia.  Not thrombocytosis most likely reactive.  Hypertension.  Now normotensive. -Continue metoprolol, Catapres, Norvasc and Lasix.  Anemia- Secondary to sickle cell trait and spherocytosis Plan transfuse if H&H less than 7 -Hemoglobin improved to 11.3  Pulmonary hypertension -Monitoring closely, as needed Lasix, continue O2 supplements  Chronic kidney disease stage III Avoid nephrotoxins follow BUN and creatinine  Vitamin B 12 deficiency Vitamin B12 supplement  Objective: Vitals:   10/13/19 0342 10/13/19 0343 10/13/19 1127 10/13/19 1500  BP: (!) 149/89  140/80 108/81  Pulse: 77  89 73  Resp: 18   19  Temp: 98.8 F (37.1 C)   99.5 F (37.5 C)  TempSrc: Oral   Oral  SpO2: (!) 88% 97%  96%  Weight:      Height:        Intake/Output Summary (Last 24 hours) at 10/13/2019 1826 Last data filed at 10/13/2019 1700 Gross per 24 hour  Intake 480 ml  Output 3075 ml  Net -2595 ml   Filed Weights   10/08/19 0244 10/09/19 0930  Weight: 73.5 kg 73.5 kg    Examination:  General exam: Appears calm and comfortable  Respiratory system: Clear to auscultation. Respiratory effort normal. Cardiovascular system: S1 & S2 heard, RRR. No JVD, murmurs, rubs, gallops or clicks. No pedal edema. Gastrointestinal system: Clean bandage on mid abdomen surgical scar.  Soft and nondistended, bowel sounds positive. Central nervous system: Alert and oriented. No focal neurological deficits. Extremities: Symmetric 5 x 5 power. Skin: No rashes, lesions or ulcers Psychiatry: Judgement and insight appear normal. Mood & affect appropriate.  DVT prophylaxis: SCDs. Code Status: Full Family Communication: No family at bedside Disposition Plan: Pending improvement.  Consultants:   General surgery  Procedures:  Splenectomy.  Antimicrobials:   Data Reviewed: I have personally reviewed  following labs and imaging studies  CBC: Recent Labs  Lab 10/08/19 0524  10/10/19 0732 10/10/19 0855 10/11/19 0309 10/12/19 0718 10/13/19 0542  WBC 11.3*   < > 16.4* 15.8* 14.6* 16.1* 20.9*  NEUTROABS 9.5*  --   --  10.6*  --   --   --   HGB 10.6*   < > 9.5* 9.3* 8.3* 8.9* 11.3*  HCT 30.8*   < > 26.7* 27.3* 24.4* 25.9* 33.5*  MCV 90.6   < > 88.4 89.5 91.7 92.5 92.8  PLT 86*   < > 148* 163 220 372 527*   < > = values in this interval not displayed.   Basic Metabolic Panel: Recent Labs  Lab 10/09/19 0329 10/10/19 0732 10/11/19 0309 10/12/19 0718 10/13/19 0542  NA 138 133* 136 136 135  K 3.8 4.1 3.9 3.9 4.8  CL 102 97* 102 100 95*  CO2 27 25 27 29 29   GLUCOSE 102* 127* 98 97 99  BUN 18 18 12 8 8   CREATININE 1.36* 1.55* 1.36* 1.29* 1.38*  CALCIUM 8.7* 8.7* 8.6* 9.0 9.4   GFR: Estimated Creatinine Clearance: 70.3 mL/min (A) (by C-G formula based on SCr of 1.38 mg/dL (H)). Liver Function Tests: Recent Labs  Lab 10/08/19 0524  AST 18  ALT 17  ALKPHOS 47  BILITOT 2.8*  PROT 5.9*  ALBUMIN 4.1   No results for input(s): LIPASE, AMYLASE in the last 168 hours. No results for input(s): AMMONIA in the last 168 hours. Coagulation Profile: Recent Labs  Lab 10/08/19 0524  INR 1.1   Cardiac Enzymes: No results for input(s): CKTOTAL, CKMB, CKMBINDEX, TROPONINI in the last 168 hours. BNP (last 3 results) No results for input(s): PROBNP in the last 8760 hours. HbA1C: No results for input(s): HGBA1C in the last 72 hours. CBG: No results for input(s): GLUCAP in the last 168 hours. Lipid Profile: No results for input(s): CHOL, HDL, LDLCALC, TRIG, CHOLHDL, LDLDIRECT in the last 72 hours. Thyroid Function Tests: No results for input(s): TSH, T4TOTAL, FREET4, T3FREE, THYROIDAB in the last 72 hours. Anemia Panel: No results for input(s): VITAMINB12, FOLATE, FERRITIN, TIBC, IRON, RETICCTPCT in the last 72 hours. Sepsis Labs: No results for input(s): PROCALCITON,  LATICACIDVEN in the last 168 hours.  Recent Results (from the past 240 hour(s))  Surgical pcr screen     Status: None   Collection Time: 10/08/19  6:29 AM   Specimen: Nasal Mucosa; Nasal Swab  Result Value Ref Range Status   MRSA, PCR NEGATIVE NEGATIVE Final   Staphylococcus aureus NEGATIVE NEGATIVE Final    Comment: (NOTE) The Xpert SA Assay (FDA approved for NASAL specimens in patients 24 years of age and older), is one component of a comprehensive surveillance program. It is not intended to diagnose infection nor to guide or monitor treatment. Performed at [facility], [place] [address], [place], [place] [address]   SARS CORONAVIRUS 2 (TAT 6-24 HRS) Nasopharyngeal Nasopharyngeal Swab     Status: None   Collection Time: 10/08/19  9:31 AM   Specimen: Nasopharyngeal Swab  Result Value Ref Range Status   SARS Coronavirus 2 NEGATIVE NEGATIVE Final    Comment: (NOTE) SARS-CoV-2 target nucleic acids are NOT DETECTED. The SARS-CoV-2 RNA is generally detectable in upper and lower respiratory  specimens during the acute phase of infection. Negative results do not preclude SARS-CoV-2 infection, do not rule out co-infections with other pathogens, and should not be used as the sole basis for treatment or other patient management decisions. Negative results must be combined with clinical observations, patient history, and epidemiological information. The expected result is Negative. Fact Sheet for Patients: HairSlick.nohttps://www.fda.gov/media/138098/download Fact Sheet for Healthcare Providers: quierodirigir.comhttps://www.fda.gov/media/138095/download This test is not yet approved or cleared by the [place] FDA and  has been authorized for detection and/or diagnosis of SARS-CoV-2 by FDA under an Emergency Use Authorization (EUA). This EUA will remain  in effect (meaning this test can be used) for the duration of the COVID-19 declaration under Section 56 4(b)(1) of the Act, 21 U.S.C. section  360bbb-3(b)(1), unless the authorization is terminated or revoked sooner. Performed at [facility], [address] [place], [place] [address]      Radiology Studies: Dg Chest [place]  Result Date: 10/13/2019 CLINICAL DATA:  43 year old male with shortness of breath and worsening oxygenation. EXAM: PORTABLE CHEST 1 VIEW COMPARISON:  Portable chest 10/04/2019 and earlier. FINDINGS: Portable AP semi upright view at 0407 hours. Confluent new opacity in the right lower lung, approaching consolidation. It is unclear whether this is in the middle lobe or the lower lobe. Stable lung volumes. Background increased pulmonary vascularity. Mediastinal contours are stable, borderline to mild cardiomegaly. No pneumothorax or pleural effusion. Visualized tracheal air column is within normal limits. No acute osseous abnormality identified. Negative visible bowel gas pattern. IMPRESSION: 1. Progressive right lung base opacity since the CT on 10/01/2019. Top differential considerations include pneumonia and asymmetric pulmonary edema. 2. Superimposed increased pulmonary vascularity. No pleural effusion. Electronically Signed   By: Odessa FlemingH  Hall M.D.   On: 10/13/2019 04:23    Scheduled Meds: . acetaminophen  1,000 mg Oral Q6H  . amLODipine  10 mg Oral Daily  . cloNIDine  0.2 mg Oral BID  . docusate sodium  100 mg Oral BID  . feeding supplement  1 Container Oral TID BM  . folic acid  1 mg Oral Daily  . gabapentin  300 mg Oral TID  . magic mouthwash w/lidocaine  15 mL Oral TID  . methocarbamol  1,000 mg Oral TID  . metoCLOPramide (REGLAN) injection  10 mg Intravenous Q6H  . metoprolol tartrate  25 mg Oral BID  . polyethylene glycol  17 g Oral Daily  . sodium chloride flush  3 mL Intravenous Q12H  . cyanocobalamin  1,000 mcg Oral Daily   Continuous Infusions: . sodium chloride 250 mL (10/11/19 1138)     LOS: 5 days   Time spent: 5 minutes.  I personally reviewed his chart and previous notes.   Arnetha CourserSumayya Godson Pollan, MD [facility] Pager [phone]  If 7PM-7AM, please contact night-coverage [email or web address] Password [email or web address] 10/13/2019, 6:26 PM   This record has been created using Dragon voice recognition software. Errors have been sought and corrected,but may not always be located. Such creation errors do not reflect on the standard of care.

## 2019-10-14 DIAGNOSIS — R718 Other abnormality of red blood cells: Secondary | ICD-10-CM

## 2019-10-14 DIAGNOSIS — Z9081 Acquired absence of spleen: Secondary | ICD-10-CM

## 2019-10-14 LAB — CBC
HCT: 27.5 % — ABNORMAL LOW (ref 39.0–52.0)
Hemoglobin: 8.8 g/dL — ABNORMAL LOW (ref 13.0–17.0)
MCH: 30.9 pg (ref 26.0–34.0)
MCHC: 32 g/dL (ref 30.0–36.0)
MCV: 96.5 fL (ref 80.0–100.0)
Platelets: 601 10*3/uL — ABNORMAL HIGH (ref 150–400)
RBC: 2.85 MIL/uL — ABNORMAL LOW (ref 4.22–5.81)
RDW: 18.6 % — ABNORMAL HIGH (ref 11.5–15.5)
WBC: 20.9 10*3/uL — ABNORMAL HIGH (ref 4.0–10.5)
nRBC: 0 % (ref 0.0–0.2)

## 2019-10-14 LAB — BASIC METABOLIC PANEL
Anion gap: 7 (ref 5–15)
BUN: 9 mg/dL (ref 6–20)
CO2: 27 mmol/L (ref 22–32)
Calcium: 8.7 mg/dL — ABNORMAL LOW (ref 8.9–10.3)
Chloride: 101 mmol/L (ref 98–111)
Creatinine, Ser: 1.53 mg/dL — ABNORMAL HIGH (ref 0.61–1.24)
GFR calc Af Amer: >60 mL/min (ref >60)
GFR calc non Af Amer: 54 mL/min — ABNORMAL LOW (ref >60)
Glucose, Bld: 98 mg/dL (ref 70–99)
Potassium: 4.3 mmol/L (ref 3.5–5.1)
Sodium: 135 mmol/L (ref 135–145)

## 2019-10-14 LAB — BRAIN NATRIURETIC PEPTIDE: B Natriuretic Peptide: 516 pg/mL — ABNORMAL HIGH (ref 0.0–100.0)

## 2019-10-14 LAB — PROCALCITONIN: Procalcitonin: 9.54 ng/mL

## 2019-10-14 MED ORDER — ADULT MULTIVITAMIN W/MINERALS CH
1.0000 | ORAL_TABLET | Freq: Every day | ORAL | Status: DC
  Administered 2019-10-14 – 2019-10-18 (×5): 1 via ORAL
  Filled 2019-10-14 (×5): qty 1

## 2019-10-14 MED ORDER — IPRATROPIUM-ALBUTEROL 0.5-2.5 (3) MG/3ML IN SOLN
3.0000 mL | Freq: Three times a day (TID) | RESPIRATORY_TRACT | Status: DC
  Administered 2019-10-14 – 2019-10-16 (×6): 3 mL via RESPIRATORY_TRACT
  Filled 2019-10-14 (×6): qty 3

## 2019-10-14 MED ORDER — ENSURE ENLIVE PO LIQD
237.0000 mL | Freq: Two times a day (BID) | ORAL | Status: DC
  Administered 2019-10-15 – 2019-10-17 (×2): 237 mL via ORAL

## 2019-10-14 MED ORDER — SENNOSIDES-DOCUSATE SODIUM 8.6-50 MG PO TABS: 2.0000 | ORAL_TABLET | Freq: Every evening | ORAL | Status: DC | PRN

## 2019-10-14 MED ORDER — POLYETHYLENE GLYCOL 3350 17 G PO PACK
17.0000 g | PACK | Freq: Two times a day (BID) | ORAL | Status: DC
  Administered 2019-10-14 – 2019-10-18 (×5): 17 g via ORAL
  Filled 2019-10-14 (×9): qty 1

## 2019-10-14 MED ORDER — ENOXAPARIN SODIUM 40 MG/0.4ML ~~LOC~~ SOLN
40.0000 mg | SUBCUTANEOUS | Status: DC
  Administered 2019-10-14 – 2019-10-18 (×5): 40 mg via SUBCUTANEOUS
  Filled 2019-10-14 (×5): qty 0.4

## 2019-10-14 MED ORDER — FUROSEMIDE 10 MG/ML IJ SOLN
40.0000 mg | Freq: Once | INTRAMUSCULAR | Status: DC
  Filled 2019-10-14: qty 4

## 2019-10-14 NOTE — Progress Notes (Signed)
[facility]/Trauma Progress Note  5 Days Post-Op   Assessment/Plan Hereditary spherocytosis Anemia of chronic disease - see above Thrombocytopenia Pulmonary HTN Essential HTN CKD stage III Vitamin B12 deficiency  Hereditary spherocytosis Splenomegaly - s/p splenectomy - Dr. Ninfa Linden - 10/09/2019 -Received vaccinations for splenectomy at [facility] - Will need Pneumococcal 23 valent in 8 weeks - hgbdropped to 8.8 from 11.3, monitor, pltto 601 -Mobilize, IS - Wean IV pain medication.  FEN -Reg VTE -SCDs, okay for lovenox from our standpoint but will defer to medicine.  ID -Ancef periop  Plan: Pt needs to eat more. Added boost. Needs to ambulate. BID miralax   LOS: 6 days    Subjective: CC: abdominal pain  No BM but having flatus. Mild nausea but no vomiting. Pt is ordering breakfast and is going to try to eat. He states when he eats his abdomen hurts.   Objective: Vital signs in last 24 hours: Temp:  [99.5 F (37.5 C)-100.4 F (38 C)] 100.4 F (38 C) (11/24 0628) Pulse Rate:  [73-89] 87 (11/24 0628) Resp:  [18-19] 18 (11/24 0628) BP: (108-140)/(68-81) 134/68 (11/24 0628) SpO2:  [94 %-96 %] 94 % (11/24 0628) Last BM Date: 10/09/19  Intake/Output from previous day: 11/23 0701 - 11/24 0700 In: 1006.1 [P.O.:840; I.V.:66.1; IV Piggyback:100] Out: 1200 [Urine:1200] Intake/Output this shift: No intake/output data recorded.  PE:  Gen:  Alert, NAD, pleasant, cooperative Pulm:  Rate and effort normal Abd: Soft,no distension, generalized TTP without guarding, +BS. Midline wound with staples in place. No signs of infection.  Psych: A&Ox3  Skin: no rashes noted, warm and dry   Anti-infectives: Anti-infectives (From admission, onward)   Start     Dose/Rate Route Frequency Ordered Stop   10/13/19 1930  Ampicillin-Sulbactam (UNASYN) 3 g in sodium chloride 0.9 % 100 mL IVPB     3 g 200 mL/hr over 30 Minutes Intravenous Every 6 hours 10/13/19  1921     10/09/19 0915  ceFAZolin (ANCEF) IVPB 2g/100 mL premix     2 g 200 mL/hr over 30 Minutes Intravenous  Once 10/08/19 1408 10/09/19 1157      Lab Results:  Recent Labs    10/13/19 0542 10/14/19 0210  WBC 20.9* 20.9*  HGB 11.3* 8.8*  HCT 33.5* 27.5*  PLT 527* 601*   BMET Recent Labs    10/13/19 0542 10/14/19 0210  NA 135 135  K 4.8 4.3  CL 95* 101  CO2 29 27  GLUCOSE 99 98  BUN 8 9  CREATININE 1.38* 1.53*  CALCIUM 9.4 8.7*   PT/INR No results for input(s): LABPROT, INR in the last 72 hours. CMP     Component Value Date/Time   NA 135 10/14/2019 0210   K 4.3 10/14/2019 0210   CL 101 10/14/2019 0210   CO2 27 10/14/2019 0210   GLUCOSE 98 10/14/2019 0210   BUN 9 10/14/2019 0210   CREATININE 1.53 (H) 10/14/2019 0210   CALCIUM 8.7 (L) 10/14/2019 0210   PROT 5.9 (L) 10/08/2019 0524   ALBUMIN 4.1 10/08/2019 0524   AST 18 10/08/2019 0524   ALT 17 10/08/2019 0524   ALKPHOS 47 10/08/2019 0524   BILITOT 2.8 (H) 10/08/2019 0524   GFRNONAA 54 (L) 10/14/2019 0210   GFRAA >60 10/14/2019 0210   Lipase  No results found for: LIPASE  Studies/Results: Dg Chest Port 1 View  Result Date: 10/13/2019 CLINICAL DATA:  43 year old male with shortness of breath and worsening oxygenation. EXAM: PORTABLE CHEST 1 VIEW COMPARISON:  Portable chest 10/04/2019 and earlier. FINDINGS: Portable AP semi upright view at 0407 hours. Confluent new opacity in the right lower lung, approaching consolidation. It is unclear whether this is in the middle lobe or the lower lobe. Stable lung volumes. Background increased pulmonary vascularity. Mediastinal contours are stable, borderline to mild cardiomegaly. No pneumothorax or pleural effusion. Visualized tracheal air column is within normal limits. No acute osseous abnormality identified. Negative visible bowel gas pattern. IMPRESSION: 1. Progressive right lung base opacity since the CT on 10/01/2019. Top differential considerations include  pneumonia and asymmetric pulmonary edema. 2. Superimposed increased pulmonary vascularity. No pleural effusion. Electronically Signed   By: Odessa Fleming M.D.   On: 10/13/2019 04:23     Jerre Simon, [facility] Please see amion for pager for the following: Horald Chestnut, & Friday 7:00am - 4:30pm Thursdays 7:00am -11:30am

## 2019-10-14 NOTE — Progress Notes (Signed)
PROGRESS NOTE    Alejandro Perez  ATF:[id number] DOB: [birth year] DOA: 10/08/2019 PCP: Patient, No Pcp Per   Brief Narrative:  43-year with history of HTN, pulmonary HTN, spherocytosis initially admitted to [facility] for severe abdominal pain found to have splenomegaly transferred here from [facility] for further management.  Underwent splenectomy on 11/19.  Received routine vaccination per oncology prior to his surgery at [place]:   Active Problems:   Chest pain   Thrombocytopenia (HCC)   Hypertension   Spleen enlarged   Anemia   Schistocytes on peripheral blood smear   Abdominal pain, RUQ   Vitamin B12 deficiency   Pulmonary HTN (HCC)   Splenomegaly   Nausea   S/P splenectomy during current hospitalization   SOB (shortness of breath)  Hereditary spherocytosis with hemolytic anemia/splenomegaly -Status post splenectomy 11/19.  Postop management per general surgery -We will need pneumococcal 23 vaccination in 8 weeks -Supportive care, pain control.  Bowel regimen -Diet as tolerated -Advised out of bed to chair, ambulation.  Aspiration pneumonitis -Chest x-ray showed right basilar opacity.  Will give bronchodilators -Check procalcitonin, BNP -On IV Unasyn, if pro calcitonin negative this can be stopped -Lasix 40 mg IV once  CKD stage IIIa -Renal function stable.  Creatinine around baseline of 1.4.  Vitamin B12 deficiency -Supplementation ordered  Essential hypertension -Continue home meds including metoprolol, Norvasc, Catapres and Lasix  Oral thrush -Magic mouthwash    DVT prophylaxis: Lovenox Code Status: Full code Family Communication:   Disposition Plan:   Consultants:   General surgery  Procedures:   Splenectomy 11/19  Antimicrobials:   Unasyn   Subjective: Abdominal cramping with eating.  He was able to pass gas this morning.  Slight shortness of breath but overall feels okay.  Review of  Systems Otherwise negative except as per HPI, including: General: Denies fever, chills, night sweats or unintended weight loss. Resp: Denies cough, wheezing Cardiac: Denies chest pain, palpitations, orthopnea, paroxysmal nocturnal dyspnea. GI: Denies  nausea, vomiting, diarrhea or constipation GU: Denies dysuria, frequency, hesitancy or incontinence MS: Denies muscle aches, joint pain or swelling Neuro: Denies headache, neurologic deficits (focal weakness, numbness, tingling), abnormal gait Psych: Denies anxiety, depression, SI/HI/AVH Skin: Denies new rashes or lesions ID: Denies sick contacts, exotic exposures, travel  Objective: Vitals:   10/13/19 1127 10/13/19 1500 10/13/19 2215 10/14/19 0628  BP: 140/80 108/81 121/70 134/68  Pulse: 89 73 81 87  Resp:  19 18 18   Temp:  99.5 F (37.5 C) 99.9 F (37.7 C) (!) 100.4 F (38 C)  TempSrc:  Oral Oral Oral  SpO2:  96% 95% 94%  Weight:      Height:        Intake/Output Summary (Last 24 hours) at 10/14/2019 0845 Last data filed at 10/14/2019 0630 Gross per 24 hour  Intake 1006.07 ml  Output 1200 ml  Net -193.93 ml   Filed Weights   10/08/19 0244 10/09/19 0930  Weight: 73.5 kg 73.5 kg    Examination:  General exam: Appears calm and comfortable  Respiratory system: Bibasilar crackles midway up his lung fields Cardiovascular system: S1 & S2 heard, RRR. No JVD, murmurs, rubs, gallops or clicks. No pedal edema. Gastrointestinal system: Surgical dressing/midline scar noted.  Diminished bowel sounds Central nervous system: Alert and oriented. No focal neurological deficits. Extremities: Symmetric 5 x 5 power. Skin: No rashes, lesions or ulcers Psychiatry: Judgement and insight appear normal. Mood & affect appropriate.     Data Reviewed:  CBC: Recent Labs  Lab 10/08/19 0524  10/10/19 0855 10/11/19 0309 10/12/19 0718 10/13/19 0542 10/14/19 0210  WBC 11.3*   < > 15.8* 14.6* 16.1* 20.9* 20.9*  NEUTROABS 9.5*  --  10.6*   --   --   --   --   HGB 10.6*   < > 9.3* 8.3* 8.9* 11.3* 8.8*  HCT 30.8*   < > 27.3* 24.4* 25.9* 33.5* 27.5*  MCV 90.6   < > 89.5 91.7 92.5 92.8 96.5  PLT 86*   < > 163 220 372 527* 601*   < > = values in this interval not displayed.   Basic Metabolic Panel: Recent Labs  Lab 10/10/19 0732 10/11/19 0309 10/12/19 0718 10/13/19 0542 10/14/19 0210  NA 133* 136 136 135 135  K 4.1 3.9 3.9 4.8 4.3  CL 97* 102 100 95* 101  CO2 25 27 29 29 27   GLUCOSE 127* 98 97 99 98  BUN 18 12 8 8 9   CREATININE 1.55* 1.36* 1.29* 1.38* 1.53*  CALCIUM 8.7* 8.6* 9.0 9.4 8.7*   GFR: Estimated Creatinine Clearance: 63.4 mL/min (A) (by C-G formula based on SCr of 1.53 mg/dL (H)). Liver Function Tests: Recent Labs  Lab 10/08/19 0524  AST 18  ALT 17  ALKPHOS 47  BILITOT 2.8*  PROT 5.9*  ALBUMIN 4.1   No results for input(s): LIPASE, AMYLASE in the last 168 hours. No results for input(s): AMMONIA in the last 168 hours. Coagulation Profile: Recent Labs  Lab 10/08/19 0524  INR 1.1   Cardiac Enzymes: No results for input(s): CKTOTAL, CKMB, CKMBINDEX, TROPONINI in the last 168 hours. BNP (last 3 results) No results for input(s): PROBNP in the last 8760 hours. HbA1C: No results for input(s): HGBA1C in the last 72 hours. CBG: No results for input(s): GLUCAP in the last 168 hours. Lipid Profile: No results for input(s): CHOL, HDL, LDLCALC, TRIG, CHOLHDL, LDLDIRECT in the last 72 hours. Thyroid Function Tests: No results for input(s): TSH, T4TOTAL, FREET4, T3FREE, THYROIDAB in the last 72 hours. Anemia Panel: No results for input(s): VITAMINB12, FOLATE, FERRITIN, TIBC, IRON, RETICCTPCT in the last 72 hours. Sepsis Labs: No results for input(s): PROCALCITON, LATICACIDVEN in the last 168 hours.  Recent Results (from the past 240 hour(s))  Surgical pcr screen     Status: None   Collection Time: 10/08/19  6:29 AM   Specimen: Nasal Mucosa; Nasal Swab  Result Value Ref Range Status   MRSA, PCR  NEGATIVE NEGATIVE Final   Staphylococcus aureus NEGATIVE NEGATIVE Final    Comment: (NOTE) The Xpert SA Assay (FDA approved for NASAL specimens in patients 30 years of age and older), is one component of a comprehensive surveillance program. It is not intended to diagnose infection nor to guide or monitor treatment. Performed at [facility], [address], [place], [address] [place]   SARS CORONAVIRUS 2 (TAT 6-24 HRS) Nasopharyngeal Nasopharyngeal Swab     Status: None   Collection Time: 10/08/19  9:31 AM   Specimen: Nasopharyngeal Swab  Result Value Ref Range Status   SARS Coronavirus 2 NEGATIVE NEGATIVE Final    Comment: (NOTE) SARS-CoV-2 target nucleic acids are NOT DETECTED. The SARS-CoV-2 RNA is generally detectable in upper and lower respiratory specimens during the acute phase of infection. Negative results do not preclude SARS-CoV-2 infection, do not rule out co-infections with other pathogens, and should not be used as the sole basis for treatment or other patient management decisions. Negative results must be  combined with clinical observations, patient history, and epidemiological information. The expected result is Negative. Fact Sheet for Patients: HairSlick.nohttps://www.fda.gov/media/138098/download Fact Sheet for Healthcare Providers: quierodirigir.comhttps://www.fda.gov/media/138095/download This test is not yet approved or cleared by the [place] FDA and  has been authorized for detection and/or diagnosis of SARS-CoV-2 by FDA under an Emergency Use Authorization (EUA). This EUA will remain  in effect (meaning this test can be used) for the duration of the COVID-19 declaration under Section 56 4(b)(1) of the Act, 21 U.S.C. section 360bbb-3(b)(1), unless the authorization is terminated or revoked sooner. Performed at [facility], [address] [place], [place] [address]          Radiology Studies: Dg Chest [place]  Result Date: 10/13/2019 CLINICAL DATA:   43 year old male with shortness of breath and worsening oxygenation. EXAM: PORTABLE CHEST 1 VIEW COMPARISON:  Portable chest 10/04/2019 and earlier. FINDINGS: Portable AP semi upright view at 0407 hours. Confluent new opacity in the right lower lung, approaching consolidation. It is unclear whether this is in the middle lobe or the lower lobe. Stable lung volumes. Background increased pulmonary vascularity. Mediastinal contours are stable, borderline to mild cardiomegaly. No pneumothorax or pleural effusion. Visualized tracheal air column is within normal limits. No acute osseous abnormality identified. Negative visible bowel gas pattern. IMPRESSION: 1. Progressive right lung base opacity since the CT on 10/01/2019. Top differential considerations include pneumonia and asymmetric pulmonary edema. 2. Superimposed increased pulmonary vascularity. No pleural effusion. Electronically Signed   By: Odessa FlemingH  Hall M.D.   On: 10/13/2019 04:23        Scheduled Meds: . acetaminophen  1,000 mg Oral Q6H  . amLODipine  10 mg Oral Daily  . cloNIDine  0.2 mg Oral BID  . docusate sodium  100 mg Oral BID  . feeding supplement  1 Container Oral TID BM  . folic acid  1 mg Oral Daily  . gabapentin  300 mg Oral TID  . magic mouthwash w/lidocaine  15 mL Oral TID  . methocarbamol  1,000 mg Oral TID  . metoCLOPramide (REGLAN) injection  10 mg Intravenous Q6H  . metoprolol tartrate  25 mg Oral BID  . polyethylene glycol  17 g Oral BID  . sodium chloride flush  3 mL Intravenous Q12H  . cyanocobalamin  1,000 mcg Oral Daily   Continuous Infusions: . sodium chloride Stopped (10/12/19 0736)  . ampicillin-sulbactam (UNASYN) IV 3 g (10/14/19 0649)     LOS: 6 days   Time spent= 35 mins    Ankit Joline Maxcyhirag Amin, MD [facility]  If 7PM-7AM, please contact night-coverage  10/14/2019, 8:45 AM

## 2019-10-14 NOTE — Progress Notes (Signed)
Nutrition Follow-up  RD working remotely.  DOCUMENTATION CODES:   Not applicable  INTERVENTION:   -D/c Boost Breeze -Ensure Enlive po BID, each supplement provides 350 kcal and 20 grams of protein -MVI with minerals daily  NUTRITION DIAGNOSIS:   Increased nutrient needs related to acute illness(splenomegaly) as evidenced by estimated needs.  Ongoing  GOAL:   Patient will meet greater than or equal to 90% of their needs  Progressing   MONITOR:   PO intake, Weight trends  REASON FOR ASSESSMENT:   Malnutrition Screening Tool    ASSESSMENT:   43 year old pt admitted with left upper quad abdominal pain with PMH of HTN, pulmonary HTN, spherocytosis, hemolytic anemia, B12 deficiency, CKD stage III and splenomegaly. General surgery has been consulted for splenectomy.  11/19- s/p splenectomy  Reviewed I/O's: -194 ml x 24 hours and -3.3 L since admission  UOP: 1.2 L x 24 hours  Per chart review, pt struggling with pain control. He is very anxious and often calls nurses station for coke, ice cream, and pain medication. Noted meal completion 50-100%. Boost Breeze added by surgery service today, however, pt refusing. Noted pt with oral thrush; currently receiving magic mouthwash.   Pt with no documented BM since 10/09/19. Miralax added today.   Medications reviewed and include reglan.   Labs reviewed.   Diet Order:   Diet Order            Diet regular Room service appropriate? Yes; Fluid consistency: Thin  Diet effective now              EDUCATION NEEDS:   Education needs have been addressed  Skin:  Skin Assessment: Skin Integrity Issues: Skin Integrity Issues:: Incisions Incisions: closed abdomen  Last BM:  10/09/19  Height:   Ht Readings from Last 1 Encounters:  10/09/19 6\' 2"  (1.88 m)    Weight:   Wt Readings from Last 1 Encounters:  10/09/19 73.5 kg    Ideal Body Weight:  86.4 kg  BMI:  Body mass index is 20.8 kg/m.  Estimated  Nutritional Needs:   Kcal:  2000-2200  Protein:  100-110  Fluid:  >2L    Donis Pinder A. Jimmye Norman, RD, LDN, [place] Registered Dietitian II Certified Diabetes Care and Education Specialist Pager: [phone] After hours Pager: [phone]

## 2019-10-14 NOTE — Progress Notes (Signed)
Patient does not want to take his ordered Lasix. Patient wants to wait until tomorrow.

## 2019-10-15 LAB — BASIC METABOLIC PANEL
Anion gap: 7 (ref 5–15)
BUN: 8 mg/dL (ref 6–20)
CO2: 28 mmol/L (ref 22–32)
Calcium: 8.7 mg/dL — ABNORMAL LOW (ref 8.9–10.3)
Chloride: 102 mmol/L (ref 98–111)
Creatinine, Ser: 1.38 mg/dL — ABNORMAL HIGH (ref 0.61–1.24)
GFR calc Af Amer: >60 mL/min (ref >60)
GFR calc non Af Amer: >60 mL/min (ref >60)
Glucose, Bld: 88 mg/dL (ref 70–99)
Potassium: 4.1 mmol/L (ref 3.5–5.1)
Sodium: 137 mmol/L (ref 135–145)

## 2019-10-15 LAB — MAGNESIUM: Magnesium: 2 mg/dL (ref 1.7–2.4)

## 2019-10-15 LAB — CBC
HCT: 28.8 % — ABNORMAL LOW (ref 39.0–52.0)
Hemoglobin: 9.5 g/dL — ABNORMAL LOW (ref 13.0–17.0)
MCH: 30.9 pg (ref 26.0–34.0)
MCHC: 33 g/dL (ref 30.0–36.0)
MCV: 93.8 fL (ref 80.0–100.0)
Platelets: 700 10*3/uL — ABNORMAL HIGH (ref 150–400)
RBC: 3.07 MIL/uL — ABNORMAL LOW (ref 4.22–5.81)
RDW: 18.2 % — ABNORMAL HIGH (ref 11.5–15.5)
WBC: 12.3 10*3/uL — ABNORMAL HIGH (ref 4.0–10.5)
nRBC: 0 % (ref 0.0–0.2)

## 2019-10-15 MED ORDER — HYDROMORPHONE HCL 1 MG/ML IJ SOLN
1.0000 mg | Freq: Four times a day (QID) | INTRAMUSCULAR | Status: DC | PRN
  Administered 2019-10-15 – 2019-10-18 (×11): 1 mg via INTRAVENOUS
  Filled 2019-10-15 (×11): qty 1

## 2019-10-15 MED ORDER — PHENOL 1.4 % MT LIQD
1.0000 | OROMUCOSAL | Status: DC | PRN
  Administered 2019-10-15: 1 via OROMUCOSAL
  Filled 2019-10-15: qty 177

## 2019-10-15 MED ORDER — FUROSEMIDE 10 MG/ML IJ SOLN
40.0000 mg | Freq: Once | INTRAMUSCULAR | Status: AC
  Administered 2019-10-15: 40 mg via INTRAVENOUS
  Filled 2019-10-15: qty 4

## 2019-10-15 MED ORDER — DICLOFENAC SODIUM 1 % EX GEL
2.0000 g | Freq: Three times a day (TID) | CUTANEOUS | Status: DC
  Administered 2019-10-15 – 2019-10-18 (×8): 2 g via TOPICAL
  Filled 2019-10-15: qty 100

## 2019-10-15 MED ORDER — OXYCODONE HCL 5 MG PO TABS
10.0000 mg | ORAL_TABLET | Freq: Four times a day (QID) | ORAL | Status: DC | PRN
  Administered 2019-10-15 – 2019-10-18 (×10): 10 mg via ORAL
  Filled 2019-10-15 (×11): qty 2

## 2019-10-15 NOTE — Progress Notes (Signed)
PROGRESS NOTE    Alejandro FlorBruce Zappia  ZOX:[id number] DOB: [birth year] DOA: 10/08/2019 PCP: Patient, No Pcp Per   Brief Narrative:  HPI On 10/08/2019 by Dr. Bryson DamesMircea Cristescu Alejandro Perez is a 43 y.o. male with medical history significant of hypertension, pulmonary hypertension, sickle cell trait, spherocytosis, hemolytic anemia, splenomegaly, left upper quadrant abdominal pain.  Came with a chief complaint is from the hospital of severe pain.  He had also thrombocytopenia and anemia and was transfused with 1 unit of platelets and 1 unit of blood. Because of his pulmonary hypertension was recommended to have the surgery  at our hospital.  Interim history Patient initially admitted to [facility] for severe abdominal pain was found to have the splenomegaly was transferred to [facility] for further management.  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  Has received some vaccinations.  Assessment & Plan   Hereditary spherocytosis with hemolytic anemia/splenomegaly -General surgery consulted and appreciated, status post splenectomy on 10/09/2019 -Will need pneumococcal 23 vaccination in 8 weeks -Continue supportive care, bowel  -Patient to ambulate and get out of bed -Diet as tolerated -Hemoglobin currently 9.5, platelets 700  Aspiration pneumonitis/acute diastolic heart failure -Chest x-ray showed right basilar opacity -Echocardiogram 12/21/2017 showed an EF of 60 to 65%, grade 2 diastolic dysfunction -Given bronchodilators -Procalcitonin 9.54 -BNP 516 -Patient normally takes Lasix 40 mg twice daily -Was given 1 dose of IV Lasix 40 mg -Continue Unasyn  CKD stage IIIa -Creatinine currently stable, 1.38  Vitamin B12 deficiency -Continue supplementation  Essential hypertension -Continue metoprolol, amlodipine, Lasix, Catapres  Oral thrush -Continue Magic mouthwash  Pain -Patient complains of pain when I lay my stethoscope on his chest as well as various other places -Suspect  musculoskeletal pain from surgery and possible coughing -Will add on Voltaren gel  DVT Prophylaxis  lovenox  Code Status: Full  Family Communication: None at bedside  Disposition Plan: Admitted. Pending improvement in pain.   Consultants General surgery   Procedures  Splenectomy   Antibiotics   Anti-infectives (From admission, onward)   Start     Dose/Rate Route Frequency Ordered Stop   10/13/19 1930  Ampicillin-Sulbactam (UNASYN) 3 g in sodium chloride 0.9 % 100 mL IVPB     3 g 200 mL/hr over 30 Minutes Intravenous Every 6 hours 10/13/19 1921     10/09/19 0915  ceFAZolin (ANCEF) IVPB 2g/100 mL premix     2 g 200 mL/hr over 30 Minutes Intravenous  Once 10/08/19 1408 10/09/19 1157      Subjective:   Alejandro Perez seen and examined today.  Patient complains of pain everywhere and states that sharp in character.  Patient complains of my stethoscope causing him chest pain.  Complains of abdominal pain at the incision as well as to the left.  Does not want a walk outside of the room.  Also states that he has not had a bowel movement.  Denies current dizziness or headache.  Denies shortness of breath.  Objective:   Vitals:   10/14/19 2120 10/14/19 2142 10/15/19 0542 10/15/19 0753  BP: 137/74  (!) 152/85   Pulse: 76  79   Resp: 18  18   Temp: 99 F (37.2 C)  98.4 F (36.9 C)   TempSrc: Oral  Oral   SpO2: 96% 96% 93% 96%  Weight:      Height:        Intake/Output Summary (Last 24 hours) at 10/15/2019 1038 Last data filed at 10/15/2019 0953 Gross per 24 hour  Intake 540  ml  Output 1800 ml  Net -1260 ml   Filed Weights   10/08/19 0244 10/09/19 0930  Weight: 73.5 kg 73.5 kg    Exam  General: Well developed, well nourished, NAD, appears stated age  HEENT: NCAT, PERRLA, EOMI, Anicteic Sclera, mucous membranes moist.   Neck: Supple, no JVD (complains of pain with placement of stethoscope)   Cardiovascular: S1 S2 auscultated, RRR, no murmur (complains of pain with  placement of stethoscope)   Respiratory: Clear to auscultation bilaterally with equal chest rise  Abdomen: Soft, TTP, nondistended, + bowel sounds, staples in place  Extremities: warm dry without cyanosis clubbing or edema  Neuro: AAOx3, nonfocal  Psych: Appropriate mood and affect   Data Reviewed: I have personally reviewed following labs and imaging studies  CBC: Recent Labs  Lab 10/10/19 0855 10/11/19 0309 10/12/19 0718 10/13/19 0542 10/14/19 0210 10/15/19 0243  WBC 15.8* 14.6* 16.1* 20.9* 20.9* 12.3*  NEUTROABS 10.6*  --   --   --   --   --   HGB 9.3* 8.3* 8.9* 11.3* 8.8* 9.5*  HCT 27.3* 24.4* 25.9* 33.5* 27.5* 28.8*  MCV 89.5 91.7 92.5 92.8 96.5 93.8  PLT 163 220 372 527* 601* 700*   Basic Metabolic Panel: Recent Labs  Lab 10/11/19 0309 10/12/19 0718 10/13/19 0542 10/14/19 0210 10/15/19 0243  NA 136 136 135 135 137  K 3.9 3.9 4.8 4.3 4.1  CL 102 100 95* 101 102  CO2 27 29 29 27 28   GLUCOSE 98 97 99 98 88  BUN 12 8 8 9 8   CREATININE 1.36* 1.29* 1.38* 1.53* 1.38*  CALCIUM 8.6* 9.0 9.4 8.7* 8.7*  MG  --   --   --   --  2.0   GFR: Estimated Creatinine Clearance: 70.3 mL/min (A) (by C-G formula based on SCr of 1.38 mg/dL (H)). Liver Function Tests: No results for input(s): AST, ALT, ALKPHOS, BILITOT, PROT, ALBUMIN in the last 168 hours. No results for input(s): LIPASE, AMYLASE in the last 168 hours. No results for input(s): AMMONIA in the last 168 hours. Coagulation Profile: No results for input(s): INR, PROTIME in the last 168 hours. Cardiac Enzymes: No results for input(s): CKTOTAL, CKMB, CKMBINDEX, TROPONINI in the last 168 hours. BNP (last 3 results) No results for input(s): PROBNP in the last 8760 hours. HbA1C: No results for input(s): HGBA1C in the last 72 hours. CBG: No results for input(s): GLUCAP in the last 168 hours. Lipid Profile: No results for input(s): CHOL, HDL, LDLCALC, TRIG, CHOLHDL, LDLDIRECT in the last 72 hours. Thyroid Function  Tests: No results for input(s): TSH, T4TOTAL, FREET4, T3FREE, THYROIDAB in the last 72 hours. Anemia Panel: No results for input(s): VITAMINB12, FOLATE, FERRITIN, TIBC, IRON, RETICCTPCT in the last 72 hours. Urine analysis:    Component Value Date/Time   COLORURINE YELLOW 10/08/2019 0456   APPEARANCEUR CLEAR 10/08/2019 0456   LABSPEC 1.013 10/08/2019 0456   PHURINE 6.0 10/08/2019 0456   GLUCOSEU NEGATIVE 10/08/2019 0456   HGBUR NEGATIVE 10/08/2019 0456   BILIRUBINUR NEGATIVE 10/08/2019 0456   KETONESUR NEGATIVE 10/08/2019 0456   PROTEINUR NEGATIVE 10/08/2019 0456   NITRITE NEGATIVE 10/08/2019 0456   LEUKOCYTESUR NEGATIVE 10/08/2019 0456   Sepsis Labs: @LABRCNTIP (procalcitonin:4,lacticidven:4)  ) Recent Results (from the past 240 hour(s))  Surgical pcr screen     Status: None   Collection Time: 10/08/19  6:29 AM   Specimen: Nasal Mucosa; Nasal Swab  Result Value Ref Range Status   MRSA, PCR NEGATIVE NEGATIVE Final  Staphylococcus aureus NEGATIVE NEGATIVE Final    Comment: (NOTE) The Xpert SA Assay (FDA approved for NASAL specimens in patients 35 years of age and older), is one component of a comprehensive surveillance program. It is not intended to diagnose infection nor to guide or monitor treatment. Performed at [facility], [place] [address], [place], [place] [address]   SARS CORONAVIRUS 2 (TAT 6-24 HRS) Nasopharyngeal Nasopharyngeal Swab     Status: None   Collection Time: 10/08/19  9:31 AM   Specimen: Nasopharyngeal Swab  Result Value Ref Range Status   SARS Coronavirus 2 NEGATIVE NEGATIVE Final    Comment: (NOTE) SARS-CoV-2 target nucleic acids are NOT DETECTED. The SARS-CoV-2 RNA is generally detectable in upper and lower respiratory specimens during the acute phase of infection. Negative results do not preclude SARS-CoV-2 infection, do not rule out co-infections with other pathogens, and should not be used as the sole basis for treatment or other patient  management decisions. Negative results must be combined with clinical observations, patient history, and epidemiological information. The expected result is Negative. Fact Sheet for Patients: SugarRoll.be Fact Sheet for Healthcare Providers: https://www.woods-mathews.com/ This test is not yet approved or cleared by the [place] FDA and  has been authorized for detection and/or diagnosis of SARS-CoV-2 by FDA under an Emergency Use Authorization (EUA). This EUA will remain  in effect (meaning this test can be used) for the duration of the COVID-19 declaration under Section 56 4(b)(1) of the Act, 21 U.S.C. section 360bbb-3(b)(1), unless the authorization is terminated or revoked sooner. Performed at [facility], [place] [address] [place], [place] [address]       Radiology Studies: No results found.   Scheduled Meds: . acetaminophen  1,000 mg Oral Q6H  . amLODipine  10 mg Oral Daily  . cloNIDine  0.2 mg Oral BID  . diclofenac Sodium  2 g Topical TID AC & HS  . docusate sodium  100 mg Oral BID  . enoxaparin (LOVENOX) injection  40 mg Subcutaneous Q24H  . feeding supplement (ENSURE ENLIVE)  237 mL Oral BID BM  . folic acid  1 mg Oral Daily  . furosemide  40 mg Intravenous Once  . gabapentin  300 mg Oral TID  . ipratropium-albuterol  3 mL Nebulization TID  . magic mouthwash w/lidocaine  15 mL Oral TID  . methocarbamol  1,000 mg Oral TID  . metoCLOPramide (REGLAN) injection  10 mg Intravenous Q6H  . metoprolol tartrate  25 mg Oral BID  . multivitamin with minerals  1 tablet Oral Daily  . polyethylene glycol  17 g Oral BID  . sodium chloride flush  3 mL Intravenous Q12H  . cyanocobalamin  1,000 mcg Oral Daily   Continuous Infusions: . sodium chloride Stopped (10/12/19 0736)  . ampicillin-sulbactam (UNASYN) IV 3 g (10/15/19 2536)     LOS: 7 days   Time Spent in minutes   30 minutes  Alejandro Perez D.O. on 10/15/2019 at  10:38 AM  Between 7am to 7pm - Please see pager noted on [email or web address]  After 7pm go to [email or web address]  And look for the night coverage person covering for me after hours  [facility] Office  [phone]

## 2019-10-15 NOTE — Progress Notes (Signed)
[facility]/Trauma Progress Note  6 Days Post-Op   Assessment/Plan Hereditary spherocytosis Anemia of chronic disease - see above Thrombocytopenia Pulmonary HTN Essential HTN CKD stage III Vitamin B12 deficiency  Hereditary spherocytosis Splenomegaly - s/p splenectomy - Dr. Ninfa Linden - 10/09/2019 -Received vaccinations for splenectomy at [facility] - Will need Pneumococcal 23 valent in 8 weeks - hgb stable, monitor thrombocytosis -Mobilize, IS - Wean IV pain medication.  FEN -Reg VTE -SCDs, okay for lovenox from our standpoint but will defer to medicine.  ID -Ancef periop, on Unasyn for PnA  Plan: Continue supportive care, encourage nutrition and mobilization, wean pain meds   LOS: 7 days    Subjective: CC: abdominal pain  Ate a biscuit and boost for breakfast. Walking in the room.   Objective: Vital signs in last 24 hours: Temp:  [98.4 F (36.9 C)-99.3 F (37.4 C)] 98.4 F (36.9 C) (11/25 0542) Pulse Rate:  [76-84] 79 (11/25 0542) Resp:  [18] 18 (11/25 0542) BP: (127-152)/(74-85) 152/85 (11/25 0542) SpO2:  [93 %-97 %] 96 % (11/25 0753) Last BM Date: 10/09/19  Intake/Output from previous day: 11/24 0701 - 11/25 0700 In: 540 [P.O.:540] Out: 1550 [Urine:1550] Intake/Output this shift: No intake/output data recorded.  PE:  Gen:  Alert, NAD, pleasant, cooperative Pulm:  Rate and effort normal Abd: Soft,no distension, generalized TTP without guarding. Midline wound with staples in place. No signs of infection.  Psych: A&Ox3  Skin: no rashes noted, warm and dry   Anti-infectives: Anti-infectives (From admission, onward)   Start     Dose/Rate Route Frequency Ordered Stop   10/13/19 1930  Ampicillin-Sulbactam (UNASYN) 3 g in sodium chloride 0.9 % 100 mL IVPB     3 g 200 mL/hr over 30 Minutes Intravenous Every 6 hours 10/13/19 1921     10/09/19 0915  ceFAZolin (ANCEF) IVPB 2g/100 mL premix     2 g 200 mL/hr over 30 Minutes  Intravenous  Once 10/08/19 1408 10/09/19 1157      Lab Results:  Recent Labs    10/14/19 0210 10/15/19 0243  WBC 20.9* 12.3*  HGB 8.8* 9.5*  HCT 27.5* 28.8*  PLT 601* 700*   BMET Recent Labs    10/14/19 0210 10/15/19 0243  NA 135 137  K 4.3 4.1  CL 101 102  CO2 27 28  GLUCOSE 98 88  BUN 9 8  CREATININE 1.53* 1.38*  CALCIUM 8.7* 8.7*   PT/INR No results for input(s): LABPROT, INR in the last 72 hours. CMP     Component Value Date/Time   NA 137 10/15/2019 0243   K 4.1 10/15/2019 0243   CL 102 10/15/2019 0243   CO2 28 10/15/2019 0243   GLUCOSE 88 10/15/2019 0243   BUN 8 10/15/2019 0243   CREATININE 1.38 (H) 10/15/2019 0243   CALCIUM 8.7 (L) 10/15/2019 0243   PROT 5.9 (L) 10/08/2019 0524   ALBUMIN 4.1 10/08/2019 0524   AST 18 10/08/2019 0524   ALT 17 10/08/2019 0524   ALKPHOS 47 10/08/2019 0524   BILITOT 2.8 (H) 10/08/2019 0524   GFRNONAA >60 10/15/2019 0243   GFRAA >60 10/15/2019 0243   Lipase  No results found for: LIPASE  Studies/Results: No results found.   Clovis Riley, [facility] Please see amion for floor coverage:

## 2019-10-16 ENCOUNTER — Inpatient Hospital Stay (HOSPITAL_COMMUNITY): Payer: Self-pay

## 2019-10-16 LAB — TROPONIN I (HIGH SENSITIVITY): Troponin I (High Sensitivity): 9 ng/L (ref <18)

## 2019-10-16 LAB — CBC
HCT: 30.7 % — ABNORMAL LOW (ref 39.0–52.0)
Hemoglobin: 10 g/dL — ABNORMAL LOW (ref 13.0–17.0)
MCH: 30.3 pg (ref 26.0–34.0)
MCHC: 32.6 g/dL (ref 30.0–36.0)
MCV: 93 fL (ref 80.0–100.0)
Platelets: 876 10*3/uL — ABNORMAL HIGH (ref 150–400)
RBC: 3.3 MIL/uL — ABNORMAL LOW (ref 4.22–5.81)
RDW: 18.2 % — ABNORMAL HIGH (ref 11.5–15.5)
WBC: 14.8 10*3/uL — ABNORMAL HIGH (ref 4.0–10.5)
nRBC: 0 % (ref 0.0–0.2)

## 2019-10-16 LAB — BASIC METABOLIC PANEL
Anion gap: 12 (ref 5–15)
BUN: 8 mg/dL (ref 6–20)
CO2: 27 mmol/L (ref 22–32)
Calcium: 8.9 mg/dL (ref 8.9–10.3)
Chloride: 100 mmol/L (ref 98–111)
Creatinine, Ser: 1.45 mg/dL — ABNORMAL HIGH (ref 0.61–1.24)
GFR calc Af Amer: >60 mL/min (ref >60)
GFR calc non Af Amer: 58 mL/min — ABNORMAL LOW (ref >60)
Glucose, Bld: 114 mg/dL — ABNORMAL HIGH (ref 70–99)
Potassium: 4.1 mmol/L (ref 3.5–5.1)
Sodium: 139 mmol/L (ref 135–145)

## 2019-10-16 LAB — MAGNESIUM: Magnesium: 1.9 mg/dL (ref 1.7–2.4)

## 2019-10-16 MED ORDER — FUROSEMIDE 40 MG PO TABS
40.0000 mg | ORAL_TABLET | Freq: Two times a day (BID) | ORAL | Status: DC
  Administered 2019-10-16 – 2019-10-18 (×4): 40 mg via ORAL
  Filled 2019-10-16 (×4): qty 1

## 2019-10-16 MED ORDER — BISACODYL 5 MG PO TBEC
10.0000 mg | DELAYED_RELEASE_TABLET | Freq: Once | ORAL | Status: AC
  Administered 2019-10-16: 10 mg via ORAL
  Filled 2019-10-16: qty 2

## 2019-10-16 NOTE — Progress Notes (Signed)
PROGRESS NOTE    Alejandro Perez  VFI:[id number] DOB: 09-07-1976 DOA: 10/08/2019 PCP: Patient, No Pcp Per   Brief Narrative:  HPI On 10/08/2019 by Dr. Bryson DamesMircea Cristescu Alejandro Perez is a 43 y.o. male with medical history significant of hypertension, pulmonary hypertension, sickle cell trait, spherocytosis, hemolytic anemia, splenomegaly, left upper quadrant abdominal pain.  Came with a chief complaint is from the hospital of severe pain.  He had also thrombocytopenia and anemia and was transfused with 1 unit of platelets and 1 unit of blood. Because of his pulmonary hypertension was recommended to have the surgery  at our hospital.  Interim history Patient initially admitted to [facility] for severe abdominal pain was found to have the splenomegaly was transferred to [facility] management.  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  Has received some vaccinations.  Assessment & Plan   Hereditary spherocytosis with hemolytic anemia/splenomegaly -General surgery consulted and appreciated, status post splenectomy on 10/09/2019 -Will need pneumococcal 23 vaccination in 8 weeks -Continue supportive care, bowel  -Patient to ambulate and get out of bed -Diet as tolerated -Hemoglobin currently 10, platelets 876  Aspiration pneumonitis/acute diastolic heart failure -Chest x-ray showed right basilar opacity -Echocardiogram 12/21/2017 showed an EF of 60 to 65%, grade 2 diastolic dysfunction -Given bronchodilators -Procalcitonin 9.54 -BNP 516 -Patient normally takes Lasix 40 mg twice daily -will restart home lasix dose -Continue Unasyn  CKD stage II -Creatinine currently stable, 1.45; GFR >60  Vitamin B12 deficiency -Continue supplementation  Essential hypertension -Continue metoprolol, amlodipine, Lasix, Catapres  Oral thrush -Continue Magic mouthwash  Pain -Patient complains of pain when I lay my stethoscope on his chest as well as various other places -Suspect  musculoskeletal pain from surgery and possible coughing -Continue Voltaren gel  Chest pain -Suspect musculoskeletal or pleuritic as pain worsens with deep inhalation or movement -Obtain EKG and discussed with cardiology (Dr. Wyline MoodBranch via phone) no changes noted from prior EKG -Obtained a high-sensitivity troponin, 9 -Chest x-ray pending  DVT Prophylaxis  lovenox  Code Status: Full  Family Communication: None at bedside  Disposition Plan: Admitted. Pending improvement in pain.   Consultants General surgery   Procedures  Splenectomy   Antibiotics   Anti-infectives (From admission, onward)   Start     Dose/Rate Route Frequency Ordered Stop   10/13/19 1930  Ampicillin-Sulbactam (UNASYN) 3 g in sodium chloride 0.9 % 100 mL IVPB     3 g 200 mL/hr over 30 Minutes Intravenous Every 6 hours 10/13/19 1921     10/09/19 0915  ceFAZolin (ANCEF) IVPB 2g/100 mL premix     2 g 200 mL/hr over 30 Minutes Intravenous  Once 10/08/19 1408 10/09/19 1157      Subjective:   Alejandro Perez seen and examined today.  Patient continues to complain of pain everywhere, but particularly in his chest this morning and states that sharp in character worsens with deep inspiration or movement.  Denies current abdominal pain, shortness of breath, cough, nausea or vomiting, diarrhea or constipation, dizziness or headache.  Objective:   Vitals:   10/15/19 2030 10/16/19 0519 10/16/19 0720 10/16/19 0814  BP: 117/62 136/79 (!) 145/87   Pulse: 88 69 69   Resp: 18 18    Temp: 98.5 F (36.9 C) 98.1 F (36.7 C)    TempSrc: Oral Oral    SpO2: 96% 100% 98% 99%  Weight:      Height:        Intake/Output Summary (Last 24 hours) at 10/16/2019 1004 Last  data filed at 10/16/2019 0856 Gross per 24 hour  Intake 120 ml  Output 1425 ml  Net -1305 ml   Filed Weights   10/08/19 0244 10/09/19 0930  Weight: 73.5 kg 73.5 kg   Exam  General: Well developed, well nourished, NAD, appears stated age  HEENT: NCAT,   mucous membranes moist.   Cardiovascular: S1 S2 auscultated, RRR, no murmur  Respiratory: Clear to auscultation bilaterally with equal chest rise  Abdomen: Soft, mildly TTP,, nondistended, + bowel sounds, staples in place  Extremities: warm dry without cyanosis clubbing or edema  Neuro: AAOx3, focal  Psych: Appropriate  Data Reviewed: I have personally reviewed following labs and imaging studies  CBC: Recent Labs  Lab 10/10/19 0855  10/12/19 0718 10/13/19 0542 10/14/19 0210 10/15/19 0243 10/16/19 0127  WBC 15.8*   < > 16.1* 20.9* 20.9* 12.3* 14.8*  NEUTROABS 10.6*  --   --   --   --   --   --   HGB 9.3*   < > 8.9* 11.3* 8.8* 9.5* 10.0*  HCT 27.3*   < > 25.9* 33.5* 27.5* 28.8* 30.7*  MCV 89.5   < > 92.5 92.8 96.5 93.8 93.0  PLT 163   < > 372 527* 601* 700* 876*   < > = values in this interval not displayed.   Basic Metabolic Panel: Recent Labs  Lab 10/12/19 0718 10/13/19 0542 10/14/19 0210 10/15/19 0243 10/16/19 0127  NA 136 135 135 137 139  K 3.9 4.8 4.3 4.1 4.1  CL 100 95* 101 102 100  CO2 29 29 27 28 27   GLUCOSE 97 99 98 88 114*  BUN 8 8 9 8 8   CREATININE 1.29* 1.38* 1.53* 1.38* 1.45*  CALCIUM 9.0 9.4 8.7* 8.7* 8.9  MG  --   --   --  2.0 1.9   GFR: Estimated Creatinine Clearance: 66.9 mL/min (A) (by C-G formula based on SCr of 1.45 mg/dL (H)). Liver Function Tests: No results for input(s): AST, ALT, ALKPHOS, BILITOT, PROT, ALBUMIN in the last 168 hours. No results for input(s): LIPASE, AMYLASE in the last 168 hours. No results for input(s): AMMONIA in the last 168 hours. Coagulation Profile: No results for input(s): INR, PROTIME in the last 168 hours. Cardiac Enzymes: No results for input(s): CKTOTAL, CKMB, CKMBINDEX, TROPONINI in the last 168 hours. BNP (last 3 results) No results for input(s): PROBNP in the last 8760 hours. HbA1C: No results for input(s): HGBA1C in the last 72 hours. CBG: No results for input(s): GLUCAP in the last 168 hours.  Lipid Profile: No results for input(s): CHOL, HDL, LDLCALC, TRIG, CHOLHDL, LDLDIRECT in the last 72 hours. Thyroid Function Tests: No results for input(s): TSH, T4TOTAL, FREET4, T3FREE, THYROIDAB in the last 72 hours. Anemia Panel: No results for input(s): VITAMINB12, FOLATE, FERRITIN, TIBC, IRON, RETICCTPCT in the last 72 hours. Urine analysis:    Component Value Date/Time   COLORURINE YELLOW 10/08/2019 0456   APPEARANCEUR CLEAR 10/08/2019 0456   LABSPEC 1.013 10/08/2019 0456   PHURINE 6.0 10/08/2019 0456   GLUCOSEU NEGATIVE 10/08/2019 0456   HGBUR NEGATIVE 10/08/2019 0456   BILIRUBINUR NEGATIVE 10/08/2019 0456   KETONESUR NEGATIVE 10/08/2019 0456   PROTEINUR NEGATIVE 10/08/2019 0456   NITRITE NEGATIVE 10/08/2019 0456   LEUKOCYTESUR NEGATIVE 10/08/2019 0456   Sepsis Labs: @LABRCNTIP (procalcitonin:4,lacticidven:4)  ) Recent Results (from the past 240 hour(s))  Surgical pcr screen     Status: None   Collection Time: 10/08/19  6:29 AM   Specimen: Nasal  Mucosa; Nasal Swab  Result Value Ref Range Status   MRSA, PCR NEGATIVE NEGATIVE Final   Staphylococcus aureus NEGATIVE NEGATIVE Final    Comment: (NOTE) The Xpert SA Assay (FDA approved for NASAL specimens in patients 41 years of age and older), is one component of a comprehensive surveillance program. It is not intended to diagnose infection nor to guide or monitor treatment. Performed at [facility], [place] [address], [place], [place] [address]   SARS CORONAVIRUS 2 (TAT 6-24 HRS) Nasopharyngeal Nasopharyngeal Swab     Status: None   Collection Time: 10/08/19  9:31 AM   Specimen: Nasopharyngeal Swab  Result Value Ref Range Status   SARS Coronavirus 2 NEGATIVE NEGATIVE Final    Comment: (NOTE) SARS-CoV-2 target nucleic acids are NOT DETECTED. The SARS-CoV-2 RNA is generally detectable in upper and lower respiratory specimens during the acute phase of infection. Negative results do not preclude SARS-CoV-2 infection,  do not rule out co-infections with other pathogens, and should not be used as the sole basis for treatment or other patient management decisions. Negative results must be combined with clinical observations, patient history, and epidemiological information. The expected result is Negative. Fact Sheet for Patients: SugarRoll.be Fact Sheet for Healthcare Providers: https://www.woods-mathews.com/ This test is not yet approved or cleared by the [place] FDA and  has been authorized for detection and/or diagnosis of SARS-CoV-2 by FDA under an Emergency Use Authorization (EUA). This EUA will remain  in effect (meaning this test can be used) for the duration of the COVID-19 declaration under Section 56 4(b)(1) of the Act, 21 U.S.C. section 360bbb-3(b)(1), unless the authorization is terminated or revoked sooner. Performed at [facility], [place] [address], [place], [place] [address]       Radiology Studies: No results found.   Scheduled Meds: . acetaminophen  1,000 mg Oral Q6H  . amLODipine  10 mg Oral Daily  . cloNIDine  0.2 mg Oral BID  . diclofenac Sodium  2 g Topical TID AC & HS  . docusate sodium  100 mg Oral BID  . enoxaparin (LOVENOX) injection  40 mg Subcutaneous Q24H  . feeding supplement (ENSURE ENLIVE)  237 mL Oral BID BM  . folic acid  1 mg Oral Daily  . gabapentin  300 mg Oral TID  . ipratropium-albuterol  3 mL Nebulization TID  . magic mouthwash w/lidocaine  15 mL Oral TID  . methocarbamol  1,000 mg Oral TID  . metoCLOPramide (REGLAN) injection  10 mg Intravenous Q6H  . metoprolol tartrate  25 mg Oral BID  . multivitamin with minerals  1 tablet Oral Daily  . polyethylene glycol  17 g Oral BID  . sodium chloride flush  3 mL Intravenous Q12H  . cyanocobalamin  1,000 mcg Oral Daily   Continuous Infusions: . sodium chloride Stopped (10/12/19 0736)  . ampicillin-sulbactam (UNASYN) IV 3 g (10/16/19 0556)     LOS: 8  days   Time Spent in minutes   45 minutes  Fawzi Melman D.O. on 10/16/2019 at 10:04 AM  Between 7am to 7pm - Please see pager noted on [email or web address]  After 7pm go to [email or web address]  And look for the night coverage person covering for me after hours  [facility] Office  [phone]

## 2019-10-16 NOTE — Progress Notes (Signed)
[facility]/Trauma Progress Note  7 Days Post-Op   Assessment/Plan Hereditary spherocytosis Anemia of chronic disease - see above Thrombocytopenia Pulmonary HTN Essential HTN CKD stage III Vitamin B12 deficiency  Hereditary spherocytosis Splenomegaly - s/p splenectomy - Dr. Ninfa Linden - 10/09/2019 -Received vaccinations for splenectomy at [facility] - Will need Pneumococcal 23 valent in 8 weeks - hgb stable, monitor thrombocytosis -Mobilize, IS - Wean IV pain medication. Still no BM.  Add po dulcolax today.  If no BM, then suppository tomorrow.    FEN -Reg VTE -SCDs, okay for lovenox from our standpoint but will defer to medicine.  ID -Ancef periop, on Unasyn for PnA  Plan: Continue supportive care, encourage nutrition and mobilization, wean pain meds   LOS: 8 days    Subjective: CC: abdominal pain  Continues to have low grade nausea, but no emesis.  Passing "plenty of gas."  No BM yet since surgery.    Objective: Vital signs in last 24 hours: Temp:  [98.1 F (36.7 C)-98.5 F (36.9 C)] 98.1 F (36.7 C) (11/26 0519) Pulse Rate:  [65-88] 69 (11/26 0720) Resp:  [16-18] 18 (11/26 0519) BP: (103-145)/(57-87) 145/87 (11/26 0720) SpO2:  [96 %-100 %] 99 % (11/26 0814) Last BM Date: (PTA)  Intake/Output from previous day: 11/25 0701 - 11/26 0700 In: 240 [P.O.:240] Out: 1400 [Urine:1400] Intake/Output this shift: Total I/O In: -  Out: 275 [Urine:275]  PE:  Gen:  Alert, NAD, pleasant, cooperative Pulm:  Rate and effort normal Abd: Soft,no distension, generalized TTP without guarding. Midline wound with staples in place. No signs of infection.  Psych: A&Ox3  Skin: no rashes noted, warm and dry   Anti-infectives: Anti-infectives (From admission, onward)   Start     Dose/Rate Route Frequency Ordered Stop   10/13/19 1930  Ampicillin-Sulbactam (UNASYN) 3 g in sodium chloride 0.9 % 100 mL IVPB     3 g 200 mL/hr over 30 Minutes Intravenous  Every 6 hours 10/13/19 1921     10/09/19 0915  ceFAZolin (ANCEF) IVPB 2g/100 mL premix     2 g 200 mL/hr over 30 Minutes Intravenous  Once 10/08/19 1408 10/09/19 1157      Lab Results:  Recent Labs    10/15/19 0243 10/16/19 0127  WBC 12.3* 14.8*  HGB 9.5* 10.0*  HCT 28.8* 30.7*  PLT 700* 876*   BMET Recent Labs    10/15/19 0243 10/16/19 0127  NA 137 139  K 4.1 4.1  CL 102 100  CO2 28 27  GLUCOSE 88 114*  BUN 8 8  CREATININE 1.38* 1.45*  CALCIUM 8.7* 8.9   PT/INR No results for input(s): LABPROT, INR in the last 72 hours. CMP     Component Value Date/Time   NA 139 10/16/2019 0127   K 4.1 10/16/2019 0127   CL 100 10/16/2019 0127   CO2 27 10/16/2019 0127   GLUCOSE 114 (H) 10/16/2019 0127   BUN 8 10/16/2019 0127   CREATININE 1.45 (H) 10/16/2019 0127   CALCIUM 8.9 10/16/2019 0127   PROT 5.9 (L) 10/08/2019 0524   ALBUMIN 4.1 10/08/2019 0524   AST 18 10/08/2019 0524   ALT 17 10/08/2019 0524   ALKPHOS 47 10/08/2019 0524   BILITOT 2.8 (H) 10/08/2019 0524   GFRNONAA 58 (L) 10/16/2019 0127   GFRAA >60 10/16/2019 0127   Lipase  No results found for: LIPASE  Studies/Results: No results found.    Milus Height, MD FACS Surgical Oncology, General Surgery, Trauma and [facility] [phone] Check [email or web address] for coverage night/weekend/holiday

## 2019-10-16 NOTE — Progress Notes (Signed)
Pharmacy Antibiotic Note  Alejandro Perez is a 43 y.o. male admitted on 10/08/2019 with abdominal pain, s/p splenectomy on 10/09/19, now with concern for aspiration pna.   Pharmacy has been consulted for Unasyn dosing. SCr CrCl ~67 ml/min.  Today is day 3 of antibiotics.  WBC down from 20.9 to 14.8, afebrile.   Plan: Unasyn 3g IV every 6 hours Monitor renal function, LOT, de-escalation plans  Height: 6\' 2"  (188 cm) Weight: 162 lb (73.5 kg) IBW/kg (Calculated) : 82.2  Temp (24hrs), Avg:98.3 F (36.8 C), Min:98.1 F (36.7 C), Max:98.5 F (36.9 C)  Recent Labs  Lab 10/12/19 0718 10/13/19 0542 10/14/19 0210 10/15/19 0243 10/16/19 0127  WBC 16.1* 20.9* 20.9* 12.3* 14.8*  CREATININE 1.29* 1.38* 1.53* 1.38* 1.45*    Estimated Creatinine Clearance: 66.9 mL/min (A) (by C-G formula based on SCr of 1.45 mg/dL (H)).    Allergies  Allergen Reactions  . Morphine And Related Other (See Comments)    Severe abdominal pain    Thank you for the interesting consult and for involving pharmacy in this patient's care.  Tamela Gammon, PharmD, BCPS 10/16/2019 12:36 PM PGY-2 Pharmacy Administration Resident Please check [email or web address] for unit-specific pharmacist phone numbers

## 2019-10-17 LAB — BASIC METABOLIC PANEL
Anion gap: 10 (ref 5–15)
BUN: 10 mg/dL (ref 6–20)
CO2: 26 mmol/L (ref 22–32)
Calcium: 8.8 mg/dL — ABNORMAL LOW (ref 8.9–10.3)
Chloride: 104 mmol/L (ref 98–111)
Creatinine, Ser: 1.43 mg/dL — ABNORMAL HIGH (ref 0.61–1.24)
GFR calc Af Amer: >60 mL/min (ref >60)
GFR calc non Af Amer: 59 mL/min — ABNORMAL LOW (ref >60)
Glucose, Bld: 90 mg/dL (ref 70–99)
Potassium: 4 mmol/L (ref 3.5–5.1)
Sodium: 140 mmol/L (ref 135–145)

## 2019-10-17 LAB — CBC
HCT: 33.7 % — ABNORMAL LOW (ref 39.0–52.0)
Hemoglobin: 10.8 g/dL — ABNORMAL LOW (ref 13.0–17.0)
MCH: 29.6 pg (ref 26.0–34.0)
MCHC: 32 g/dL (ref 30.0–36.0)
MCV: 92.3 fL (ref 80.0–100.0)
Platelets: 1149 10*3/uL (ref 150–400)
RBC: 3.65 MIL/uL — ABNORMAL LOW (ref 4.22–5.81)
RDW: 18.3 % — ABNORMAL HIGH (ref 11.5–15.5)
WBC: 11.1 10*3/uL — ABNORMAL HIGH (ref 4.0–10.5)
nRBC: 0 % (ref 0.0–0.2)

## 2019-10-17 MED ORDER — SENNOSIDES-DOCUSATE SODIUM 8.6-50 MG PO TABS: 2.0000 | ORAL_TABLET | Freq: Every evening | ORAL | 0 refills | Status: AC | PRN

## 2019-10-17 MED ORDER — POLYETHYLENE GLYCOL 3350 17 G PO PACK: 17.0000 g | PACK | Freq: Every day | ORAL | 0 refills | Status: DC

## 2019-10-17 MED ORDER — OXYCODONE HCL 5 MG PO TABS: 10.0000 mg | ORAL_TABLET | Freq: Four times a day (QID) | ORAL | 0 refills | Status: DC | PRN

## 2019-10-17 NOTE — Progress Notes (Signed)
[facility]/Trauma Progress Note  8 Days Post-Op   Assessment/Plan Hereditary spherocytosis Anemia of chronic disease - see above Thrombocytopenia- resolved Pulmonary HTN Essential HTN CKD stage III Vitamin B12 deficiency  Hereditary spherocytosis Splenomegaly - s/p splenectomy - Dr. Magnus Ivan - 10/09/2019 -Received vaccinations for splen[facility] at [facility] - Will need Pneumococcal 23 valent in 7 weeks - hgb stable, monitor thrombocytosis -Mobilize, IS - Wean IV pain medication. Continue bowel regimen  FEN -Reg VTE -SCDs, lovenox ID -Ancef periop, on Unasyn for PnA  Plan: probably home tomorrow.  Will get staples out before discharge.     LOS: 9 days    Subjective: CC: abdominal pain  Feeling quite a bit better after BM.  Still has some left shoulder pain but better.    Objective: Vital signs in last 24 hours: Temp:  [98 F (36.7 C)-98.7 F (37.1 C)] 98.7 F (37.1 C) (11/27 0554) Pulse Rate:  [63-67] 63 (11/27 0554) Resp:  [16-18] 16 (11/27 0554) BP: (116-149)/(64-83) 149/83 (11/27 0554) SpO2:  [98 %-100 %] 100 % (11/27 0554) Last BM Date: (PTA)  Intake/Output from previous day: 11/26 0701 - 11/27 0700 In: 1356 [P.O.:956; IV Piggyback:400] Out: 2050 [Urine:2050] Intake/Output this shift: No intake/output data recorded.  PE:  Gen:  Alert, NAD, pleasant, cooperative Pulm:  Rate and effort normal.  Decreased BS bilateral bases.   Abd: Soft,no distension, appropriately tender near incision.  Midline wound with staples in place. No signs of infection.  Psych: A&Ox3  Skin: no rashes noted, warm and dry   Anti-infectives: Anti-infectives (From admission, onward)   Start     Dose/Rate Route Frequency Ordered Stop   10/13/19 1930  Ampicillin-Sulbactam (UNASYN) 3 g in sodium chloride 0.9 % 100 mL IVPB     3 g 200 mL/hr over 30 Minutes Intravenous Every 6 hours 10/13/19 1921     10/09/19 0915  ceFAZolin (ANCEF) IVPB 2g/100 mL premix      2 g 200 mL/hr over 30 Minutes Intravenous  Once 10/08/19 1408 10/09/19 1157      Lab Results:  Recent Labs    10/16/19 0127 10/17/19 0342  WBC 14.8* 11.1*  HGB 10.0* 10.8*  HCT 30.7* 33.7*  PLT 876* 1,149*   BMET Recent Labs    10/16/19 0127 10/17/19 0342  NA 139 140  K 4.1 4.0  CL 100 104  CO2 27 26  GLUCOSE 114* 90  BUN 8 10  CREATININE 1.45* 1.43*  CALCIUM 8.9 8.8*   PT/INR No results for input(s): LABPROT, INR in the last 72 hours. CMP     Component Value Date/Time   NA 140 10/17/2019 0342   K 4.0 10/17/2019 0342   CL 104 10/17/2019 0342   CO2 26 10/17/2019 0342   GLUCOSE 90 10/17/2019 0342   BUN 10 10/17/2019 0342   CREATININE 1.43 (H) 10/17/2019 0342   CALCIUM 8.8 (L) 10/17/2019 0342   PROT 5.9 (L) 10/08/2019 0524   ALBUMIN 4.1 10/08/2019 0524   AST 18 10/08/2019 0524   ALT 17 10/08/2019 0524   ALKPHOS 47 10/08/2019 0524   BILITOT 2.8 (H) 10/08/2019 0524   GFRNONAA 59 (L) 10/17/2019 0342   GFRAA >60 10/17/2019 0342   Lipase  No results found for: LIPASE  Studies/Results: Dg Chest Port 1 View  Result Date: 10/16/2019 CLINICAL DATA:  Chest pain. EXAM: PORTABLE CHEST 1 VIEW COMPARISON:  10/13/2019 FINDINGS: Lungs are adequately inflated with moderate interval improvement in previously noted bibasilar airspace opacification. Findings likely represent improving  asymmetric edema and less likely improving infection. No evidence of effusion. Cardiomediastinal silhouette and remainder of the exam is unchanged. IMPRESSION: Moderate interval improvement in previously noted bibasilar airspace opacification. Findings likely represent improving asymmetric edema and less likely improving infection. Electronically Signed   By: Marin Olp M.D.   On: 10/16/2019 10:58      Milus Height, MD FACS Surgical Oncology, General Surgery, Trauma and [facility], [place] [phone] Check [email or web address] for coverage  night/weekend/holiday

## 2019-10-17 NOTE — Progress Notes (Signed)
CRITICAL VALUE ALERT  Critical Value:  Platelet=1149  Date & Time Notied:  0510 10/17/2019  Provider Notified: Silas Sacramento, NP  Orders Received/Actions taken: pending

## 2019-10-17 NOTE — Progress Notes (Signed)
PROGRESS NOTE    Alejandro Perez  WUG:[id number] DOB: 04-14-1976 DOA: 10/08/2019 PCP: Patient, No Pcp Per   Brief Narrative:  HPI On 10/08/2019 by Dr. Bryson Dames Cristescu Alejandro Perez is a 43 y.o. male with medical history significant of hypertension, pulmonary hypertension, sickle cell trait, spherocytosis, hemolytic anemia, splenomegaly, left upper quadrant abdominal pain.  Came with a chief complaint is from the hospital of severe pain.  He had also thrombocytopenia and anemia and was transfused with 1 unit of platelets and 1 unit of blood. Because of his pulmonary hypertension was recommended to have the surgery  at our hospital.  Interim history Patient initially admitted to [facility] for severe abdominal pain was found to have the splenomegaly was transferred to [facility] for further management.  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  Has received some vaccinations.  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 Clelia Croft, this is a response to the splenomegaly/splenectomy and can be followed as an outpatient   Aspiration pneumonitis/acute diastolic heart failure -Chest x-ray showed right basilar opacity -Echocardiogram 12/21/2017 showed an EF of 60 to 65%, grade 2 diastolic dysfunction -Given bronchodilators -Procalcitonin 9.54 -BNP 516 -Continue home dose of lasix, 40mg  BID -Continue Unasyn  CKD stage II -Creatinine currently stable, 1.43; GFR >60  Vitamin B12 deficiency -Continue supplementation  Essential hypertension -Continue metoprolol, amlodipine, Lasix, Catapres  Oral thrush  -Continue Magic mouthwash  Pain -Seems pain is better today -Suspect musculoskeletal pain from surgery and possible coughing -Continue Voltaren gel  Chest pain -Suspect musculoskeletal or pleuritic as pain worsens with deep inhalation or movement -Obtained EKG and discussed with cardiology (Dr. via phone) no changes noted from prior EKG -Obtained a high-sensitivity troponin, 9 -Chest x-ray 11/26: Improvement in previously noted bibasilar airspace opacification.  Improving asymmetric edema  DVT Prophylaxis  lovenox  Code Status: Full  Family Communication: None at bedside  Disposition Plan: Admitted. Pending improvement in pain. Suspect dispo to home on 10/18/2019  Consultants General surgery  Hem/onc, Dr. 10/20/2019, via phone  Procedures  Splenectomy   Antibiotics   Anti-infectives (From admission, onward)   Start     Dose/Rate Route Frequency Ordered Stop   10/13/19 1930  Ampicillin-Sulbactam (UNASYN) 3 g in sodium chloride 0.9 % 100 mL IVPB     3 g 200 mL/hr over 30 Minutes Intravenous Every 6 hours 10/13/19 1921     10/09/19 0915  ceFAZolin (ANCEF) IVPB 2g/100 mL premix     2 g 200 mL/hr over 30 Minutes Intravenous  Once 10/08/19 1408 10/09/19 1157      Subjective:   10/11/19 seen and examined today.  Patient feels pain is improving but not resolved, has had some in his shoulder.  Denies current chest pain, shortness of breath, abdominal pain, nausea or vomiting, diarrhea or constipation, dizziness or headache.  Objective:   Vitals:   10/16/19 0814 10/16/19 1339 10/16/19 2219 10/17/19 0554  BP:  116/70 132/64 (!) 149/83  Pulse:  67 67 63  Resp:  16 18 16   Temp:  98 F (36.7 C) 98.2 F (36.8 C) 98.7 F (37.1 C)  TempSrc:  Oral Oral Oral  SpO2: 99% 100% 98% 100%  Weight:  Height:        Intake/Output Summary (Last 24 hours) at 10/17/2019 0859 Last data filed at 10/17/2019 0024 Gross per 24 hour  Intake 1356 ml  Output 1775 ml  Net -419 ml    Filed Weights   10/08/19 0244 10/09/19 0930  Weight: 73.5 kg 73.5 kg   Exam  General: Well developed, well nourished, NAD, appears stated age  HEENT: NCAT,  mucous membranes moist.   Cardiovascular: S1 S2 auscultated, RRR, no murmur  Respiratory: Clear to auscultation bilaterally, no wheezing or rhonchi  Abdomen: Soft, mildly TTP near incision site, nondistended, + bowel sounds, staples in place  Extremities: warm dry without cyanosis clubbing or edema  Neuro: AAOx3, nonfocal  Psych: Appropriate mood and affect  Data Reviewed: I have personally reviewed following labs and imaging studies  CBC: Recent Labs  Lab 10/13/19 0542 10/14/19 0210 10/15/19 0243 10/16/19 0127 10/17/19 0342  WBC 20.9* 20.9* 12.3* 14.8* 11.1*  HGB 11.3* 8.8* 9.5* 10.0* 10.8*  HCT 33.5* 27.5* 28.8* 30.7* 33.7*  MCV 92.8 96.5 93.8 93.0 92.3  PLT 527* 601* 700* 876* 1,149*   Basic Metabolic Panel: Recent Labs  Lab 10/13/19 0542 10/14/19 0210 10/15/19 0243 10/16/19 0127 10/17/19 0342  NA 135 135 137 139 140  K 4.8 4.3 4.1 4.1 4.0  CL 95* 101 102 100 104  CO2 29 27 28 27 26   GLUCOSE 99 98 88 114* 90  BUN 8 9 8 8 10   CREATININE 1.38* 1.53* 1.38* 1.45* 1.43*  CALCIUM 9.4 8.7* 8.7* 8.9 8.8*  MG  --   --  2.0 1.9  --    GFR: Estimated Creatinine Clearance: 67.8 mL/min (A) (by C-G formula based on SCr of 1.43 mg/dL (H)). Liver Function Tests: No results for input(s): AST, ALT, ALKPHOS, BILITOT, PROT, ALBUMIN in the last 168 hours. No results for input(s): LIPASE, AMYLASE in the last 168 hours. No results for input(s): AMMONIA in the last 168 hours. Coagulation Profile: No results for input(s): INR, PROTIME in the last 168 hours. Cardiac Enzymes: No results for input(s): CKTOTAL, CKMB, CKMBINDEX, TROPONINI in the last 168 hours. BNP (last 3 results) No results for input(s): PROBNP in the last 8760 hours. HbA1C: No results for input(s): HGBA1C in the last 72 hours. CBG: No results  for input(s): GLUCAP in the last 168 hours. Lipid Profile: No results for input(s): CHOL, HDL, LDLCALC, TRIG, CHOLHDL, LDLDIRECT in the last 72 hours. Thyroid Function Tests: No results for input(s): TSH, T4TOTAL, FREET4, T3FREE, THYROIDAB in the last 72 hours. Anemia Panel: No results for input(s): VITAMINB12, FOLATE, FERRITIN, TIBC, IRON, RETICCTPCT in the last 72 hours. Urine analysis:    Component Value Date/Time   COLORURINE YELLOW 10/08/2019 0456   APPEARANCEUR CLEAR 10/08/2019 0456   LABSPEC 1.013 10/08/2019 0456   PHURINE 6.0 10/08/2019 0456   GLUCOSEU NEGATIVE 10/08/2019 0456   HGBUR NEGATIVE 10/08/2019 0456   BILIRUBINUR NEGATIVE 10/08/2019 0456   KETONESUR NEGATIVE 10/08/2019 0456   PROTEINUR NEGATIVE 10/08/2019 0456   NITRITE NEGATIVE 10/08/2019 0456   LEUKOCYTESUR NEGATIVE 10/08/2019 0456   Sepsis Labs: @LABRCNTIP (procalcitonin:4,lacticidven:4)  ) Recent Results (from the past 240 hour(s))  Surgical pcr screen     Status: None   Collection Time: 10/08/19  6:29 AM   Specimen: Nasal Mucosa; Nasal Swab  Result Value Ref Range Status   MRSA, PCR NEGATIVE NEGATIVE Final   Staphylococcus aureus NEGATIVE NEGATIVE Final    Comment: (NOTE) The Xpert SA Assay (FDA approved for NASAL specimens  in patients 12 years of age and older), is one component of a comprehensive surveillance program. It is not intended to diagnose infection nor to guide or monitor treatment. Performed at [facility], [place] [address] [place], [place] [address]   SARS CORONAVIRUS 2 (TAT 6-24 HRS) Nasopharyngeal Nasopharyngeal Swab     Status: None   Collection Time: 10/08/19  9:31 AM   Specimen: Nasopharyngeal Swab  Result Value Ref Range Status   SARS Coronavirus 2 NEGATIVE NEGATIVE Final    Comment: (NOTE) SARS-CoV-2 target nucleic acids are NOT DETECTED. The SARS-CoV-2 RNA is generally detectable in upper and lower respiratory specimens during the acute phase of infection. Negative  results do not preclude SARS-CoV-2 infection, do not rule out co-infections with other pathogens, and should not be used as the sole basis for treatment or other patient management decisions. Negative results must be combined with clinical observations, patient history, and epidemiological information. The expected result is Negative. Fact Sheet for Patients: SugarRoll.be Fact Sheet for Healthcare Providers: https://www.woods-mathews.com/ This test is not yet approved or cleared by the [place] FDA and  has been authorized for detection and/or diagnosis of SARS-CoV-2 by FDA under an Emergency Use Authorization (EUA). This EUA will remain  in effect (meaning this test can be used) for the duration of the COVID-19 declaration under Section 56 4(b)(1) of the Act, 21 U.S.C. section 360bbb-3(b)(1), unless the authorization is terminated or revoked sooner. Performed at [facility], [place] [address] [place], [place] [address]       Radiology Studies: Dg Chest Port 1 View  Result Date: 10/16/2019 CLINICAL DATA:  Chest pain. EXAM: PORTABLE CHEST 1 VIEW COMPARISON:  10/13/2019 FINDINGS: Lungs are adequately inflated with moderate interval improvement in previously noted bibasilar airspace opacification. Findings likely represent improving asymmetric edema and less likely improving infection. No evidence of effusion. Cardiomediastinal silhouette and remainder of the exam is unchanged. IMPRESSION: Moderate interval improvement in previously noted bibasilar airspace opacification. Findings likely represent improving asymmetric edema and less likely improving infection. Electronically Signed   By: Marin Olp M.D.   On: 10/16/2019 10:58     Scheduled Meds: . acetaminophen  1,000 mg Oral Q6H  . amLODipine  10 mg Oral Daily  . cloNIDine  0.2 mg Oral BID  . diclofenac Sodium  2 g Topical TID AC & HS  . docusate sodium  100 mg Oral BID  .  enoxaparin (LOVENOX) injection  40 mg Subcutaneous Q24H  . feeding supplement (ENSURE ENLIVE)  237 mL Oral BID BM  . folic acid  1 mg Oral Daily  . furosemide  40 mg Oral BID  . gabapentin  300 mg Oral TID  . magic mouthwash w/lidocaine  15 mL Oral TID  . methocarbamol  1,000 mg Oral TID  . metoCLOPramide (REGLAN) injection  10 mg Intravenous Q6H  . metoprolol tartrate  25 mg Oral BID  . multivitamin with minerals  1 tablet Oral Daily  . polyethylene glycol  17 g Oral BID  . sodium chloride flush  3 mL Intravenous Q12H  . cyanocobalamin  1,000 mcg Oral Daily   Continuous Infusions: . sodium chloride Stopped (10/12/19 0736)  . ampicillin-sulbactam (UNASYN) IV 3 g (10/17/19 0546)     LOS: 9 days   Time Spent in minutes   45 minutes  Khristin Keleher D.O. on 10/17/2019 at 8:59 AM  Between 7am to 7pm - Please see pager noted on [email or web address]  After 7pm go to [email or web address]  And look for the night coverage person covering for me after hours  [facility] Office  [phone]

## 2019-10-17 NOTE — Discharge Instructions (Signed)
[facility]      [facility] [phone]  OPEN ABDOMINAL SURGERY: POST OP INSTRUCTIONS  Always review your discharge instruction sheet given to you by the facility where your surgery was performed.  IF YOU HAVE DISABILITY OR FAMILY LEAVE FORMS, YOU MUST BRING THEM TO THE OFFICE FOR PROCESSING.  PLEASE DO NOT GIVE THEM TO YOUR DOCTOR.  1. A prescription for pain medication may be given to you upon discharge.  Take your pain medication as prescribed, if needed.  If narcotic pain medicine is not needed, then you may take acetaminophen (Tylenol) or ibuprofen (Advil) as needed. 2. Take your usually prescribed medications unless otherwise directed. 3. If you need a refill on your pain medication, please contact your pharmacy. They will contact our office to request authorization.  Prescriptions will not be filled after 5pm or on week-ends. 4. You should follow a light diet the first few days after arrival home, such as soup and crackers, pudding, etc.unless your doctor has advised otherwise. A high-fiber, low fat diet can be resumed as tolerated.   Be sure to include lots of fluids daily. Most patients will experience some swelling and bruising on the chest and neck area.  Ice packs will help.  Swelling and bruising can take several days to resolve 5. Most patients will experience some swelling and bruising in the area of the incision. Ice pack will help. Swelling and bruising can take several days to resolve..  6. It is common to experience some constipation if taking pain medication after surgery.  Increasing fluid intake and taking a stool softener will usually help or prevent this problem from occurring.  A mild laxative (Milk of Magnesia or Miralax) should be taken according to package directions if there are no bowel movements after 48 hours. 7.  You may have steri-strips (small skin tapes) in place directly over the incision.  These strips should be left on the skin for 7-10 days.  If your  surgeon used skin glue on the incision, you may shower in 24 hours.  The glue will flake off over the next 2-3 weeks.  Any sutures or staples will be removed at the office during your follow-up visit. You may find that a light gauze bandage over your incision may keep your staples from being rubbed or pulled. You may shower and replace the bandage daily. 8. ACTIVITIES:  You may resume regular (light) daily activities beginning the next day--such as daily self-care, walking, climbing stairs--gradually increasing activities as tolerated.  You may have sexual intercourse when it is comfortable.  Refrain from any heavy lifting or straining until approved by your doctor. a. You may drive when you no longer are taking prescription pain medication, you can comfortably wear a seatbelt, and you can safely maneuver your car and apply brakes b. Return to Work: ___________________________________ 9. You should see your doctor in the office for a follow-up appointment approximately two weeks after your surgery.  Make sure that you call for this appointment within a day or two after you arrive home to insure a convenient appointment time. OTHER INSTRUCTIONS:  _____________________________________________________________ _____________________________________________________________  WHEN TO CALL YOUR DOCTOR: 1. Fever over 101.0 2. Inability to urinate 3. Nausea and/or vomiting 4. Extreme swelling or bruising 5. Continued bleeding from incision. 6. Increased pain, redness, or drainage from the incision. 7. Difficulty swallowing or breathing 8. Muscle cramping or spasms. 9. Numbness or tingling in hands or feet or around lips.  The clinic staff is available to   answer your questions during regular business hours.  Please don't hesitate to call and ask to speak to one of the nurses if you have concerns.  For further questions, please visit [email or web address]   

## 2019-10-18 MED ORDER — AMLODIPINE BESYLATE 10 MG PO TABS: 10.0000 mg | ORAL_TABLET | Freq: Every day | ORAL | 0 refills | Status: DC

## 2019-10-18 MED ORDER — CLONIDINE HCL 0.2 MG PO TABS: 0.2000 mg | ORAL_TABLET | Freq: Two times a day (BID) | ORAL | 0 refills | Status: DC

## 2019-10-18 MED ORDER — ADULT MULTIVITAMIN W/MINERALS CH: 1.0000 | ORAL_TABLET | Freq: Every day | ORAL | Status: AC

## 2019-10-18 MED ORDER — METOPROLOL TARTRATE 25 MG PO TABS: 25.0000 mg | ORAL_TABLET | Freq: Two times a day (BID) | ORAL | 0 refills | Status: DC

## 2019-10-18 NOTE — Progress Notes (Signed)
[facility]/Trauma Progress Note  9 Days Post-Op   Assessment/Plan Hereditary spherocytosis Anemia of chronic disease - see above Thrombocytopenia- resolved Pulmonary HTN Essential HTN CKD stage III Vitamin B12 deficiency  Hereditary spherocytosis Splenomegaly - s/p splenectomy - Dr. Ninfa Linden - 10/09/2019 -Received vaccinations for splenectomy at [facility] - Will need Pneumococcal 23 valent in 7 weeks - hgb stable, monitor thrombocytosis -Mobilize, IS Ok for home today. Staples to come out before discharge.  Order is written.  Follow up information and instructions are in discharge. Pain meds also written.   Continue bowel regimen  FEN -Reg VTE -SCDs, lovenox ID -Ancef periop, on Unasyn for PnA  Plan: home today.     LOS: 10 days    Subjective: CC: abdominal pain Continues to feel much better.  Less shoulder pain.    Objective: Vital signs in last 24 hours: Temp:  [97.9 F (36.6 C)-98.2 F (36.8 C)] 98.1 F (36.7 C) (11/28 0558) Pulse Rate:  [54-64] 54 (11/28 0558) Resp:  [18] 18 (11/28 0558) BP: (119-156)/(74-89) 142/86 (11/28 0558) SpO2:  [96 %-100 %] 98 % (11/28 0558) Last BM Date: 10/17/19  Intake/Output from previous day: 11/27 0701 - 11/28 0700 In: 1283 [P.O.:1280; I.V.:3] Out: 1325 [Urine:1325] Intake/Output this shift: No intake/output data recorded.  PE:  Gen:  Alert, NAD, pleasant, cooperative Pulm:  Rate and effort normal.   Abd: Soft,non distended.  Incision c/d/i with staples.  No tenderness today.   Psych: A&Ox3  Skin: no rashes noted, warm and dry   Anti-infectives: Anti-infectives (From admission, onward)   Start     Dose/Rate Route Frequency Ordered Stop   10/13/19 1930  Ampicillin-Sulbactam (UNASYN) 3 g in sodium chloride 0.9 % 100 mL IVPB     3 g 200 mL/hr over 30 Minutes Intravenous Every 6 hours 10/13/19 1921     10/09/19 0915  ceFAZolin (ANCEF) IVPB 2g/100 mL premix     2 g 200 mL/hr over 30  Minutes Intravenous  Once 10/08/19 1408 10/09/19 1157      Lab Results:  Recent Labs    10/16/19 0127 10/17/19 0342  WBC 14.8* 11.1*  HGB 10.0* 10.8*  HCT 30.7* 33.7*  PLT 876* 1,149*   BMET Recent Labs    10/16/19 0127 10/17/19 0342  NA 139 140  K 4.1 4.0  CL 100 104  CO2 27 26  GLUCOSE 114* 90  BUN 8 10  CREATININE 1.45* 1.43*  CALCIUM 8.9 8.8*   PT/INR No results for input(s): LABPROT, INR in the last 72 hours. CMP     Component Value Date/Time   NA 140 10/17/2019 0342   K 4.0 10/17/2019 0342   CL 104 10/17/2019 0342   CO2 26 10/17/2019 0342   GLUCOSE 90 10/17/2019 0342   BUN 10 10/17/2019 0342   CREATININE 1.43 (H) 10/17/2019 0342   CALCIUM 8.8 (L) 10/17/2019 0342   PROT 5.9 (L) 10/08/2019 0524   ALBUMIN 4.1 10/08/2019 0524   AST 18 10/08/2019 0524   ALT 17 10/08/2019 0524   ALKPHOS 47 10/08/2019 0524   BILITOT 2.8 (H) 10/08/2019 0524   GFRNONAA 59 (L) 10/17/2019 0342   GFRAA >60 10/17/2019 0342   Lipase  No results found for: LIPASE  Studies/Results: Dg Chest Port 1 View  Result Date: 10/16/2019 CLINICAL DATA:  Chest pain. EXAM: PORTABLE CHEST 1 VIEW COMPARISON:  10/13/2019 FINDINGS: Lungs are adequately inflated with moderate interval improvement in previously noted bibasilar airspace opacification. Findings likely represent improving asymmetric edema and  less likely improving infection. No evidence of effusion. Cardiomediastinal silhouette and remainder of the exam is unchanged. IMPRESSION: Moderate interval improvement in previously noted bibasilar airspace opacification. Findings likely represent improving asymmetric edema and less likely improving infection. Electronically Signed   By: Elberta Fortis M.D.   On: 10/16/2019 10:58      Maudry Diego, MD FACS Surgical Oncology, General Surgery, Trauma and Critical [facility], [place] [phone] Check [email or web address] for coverage night/weekend/holiday

## 2019-10-18 NOTE — Discharge Summary (Signed)
Physician Discharge Summary  Alejandro Perez CHE:[id number] DOB: 05/24/1976 DOA: 10/08/2019  PCP: Patient, No Pcp Per  Admit date: 10/08/2019 Discharge date: 10/18/2019  Time spent: 45 minutes  Recommendations for Outpatient Follow-up:  Patient will be discharged to home.  Patient will need to follow up with primary care provider within one week of discharge.  Follow up with general surgery. Patient should continue medications as prescribed.  Patient should follow a heart healthydiet.   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 Renard Matter a 43 y.o.malewith medical history significant ofhypertension, pulmonary hypertension, sickle cell trait, spherocytosis, hemolytic anemia, splenomegaly, left upper quadrant abdominal pain. Came with a chief complaint is from the hospital of severe pain. He had also thrombocytopenia and anemia and was transfused with 1 unit of platelets and 1 unit of blood. Because of his pulmonary hypertension was recommended to have the surgery at our hospital.  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 Clelia Croft, this is a response to the splenomegaly/splenectomy and can be  followed as an outpatient   Aspiration pneumonitis/acute diastolic heart failure -Chest x-ray showed right basilar opacity -Echocardiogram 12/21/2017 showed an EF of 60 to 65%, grade 2 diastolic dysfunction -Given bronchodilators -Procalcitonin 9.54 -BNP 516 -Continue home dose of lasix, 40mg  BID -Was placed Unasyn, completed 5 days  CKD stage II -Creatinine currently stable, 1.43; GFR >60  Vitamin B12 deficiency -Continue supplementation  Essential hypertension -Continue metoprolol, amlodipine, Lasix, Catapres  Oral thrush -Continue Magic mouthwash  Pain -Seems pain is better today -Suspect musculoskeletal pain from surgery and possible coughing -Continue Voltaren gel  Chest pain -Suspect musculoskeletal or pleuritic as pain worsens with deep inhalation or movement -Obtained EKG and discussed with cardiology (Dr. via phone) no changes noted from prior EKG -Obtained a high-sensitivity troponin, 9 -Chest x-ray 11/26: Improvement in previously noted bibasilar airspace opacification.  Improving asymmetric edema  Consultants General surgery  Hem/onc, Dr. 12/26, via phone  Procedures  Splenectomy   Discharge Exam: Vitals:   10/17/19 2211 10/18/19 0558  BP: (!) 156/89 (!) 142/86  Pulse: 64 (!) 54  Resp: 18 18  Temp: 98.2 F (36.8 C) 98.1 F (36.7 C)  SpO2: 100% 98%     General: Well developed, well nourished, NAD, appears stated age  HEENT: NCAT, mucous membranes moist.  Cardiovascular: S1 S2 auscultated, RRR, no murmur  Respiratory: Clear to auscultation bilaterall  Abdomen: Soft, nontender, nondistended, + bowel sounds  Extremities: warm dry without cyanosis clubbing or edema  Neuro: AAOx3, nonfocal  Psych: Appropriate mood and affect  Discharge Instructions Discharge Instructions    Discharge instructions   Complete by: As directed    Patient will be discharged to home.  Patient will need to follow up with primary care provider  within one week of discharge.  Follow up with general surgery. Patient should continue medications as prescribed.  Patient  should follow a heart healthy diet.     Allergies as of 10/18/2019      Reactions   Morphine And Related Other (See Comments)   Severe abdominal pain      Medication List    STOP taking these medications   oxyCODONE-acetaminophen 7.5-325 MG tablet Commonly known as: PERCOCET     TAKE these medications   albuterol (2.5 MG/3ML) 0.083% nebulizer solution Commonly known as: PROVENTIL Take 2.5 mg by nebulization every 6 (six) hours.   amLODipine 10 MG tablet Commonly known as: NORVASC Take 1 tablet (10 mg total) by mouth daily. Start taking on: October 19, 2019 What changed:   medication strength  how much to take  Another medication with the same name was removed. Continue taking this medication, and follow the directions you see here.   cloNIDine 0.2 MG tablet Commonly known as: CATAPRES Take 1 tablet (0.2 mg total) by mouth 2 (two) times daily. What changed:   when to take this  Another medication with the same name was removed. Continue taking this medication, and follow the directions you see here.   cyanocobalamin 1000 MCG tablet Take 1 tablet (1,000 mcg total) by mouth daily.   folic acid 1 MG tablet Commonly known as: FOLVITE Take 1 tablet (1 mg total) by mouth daily.   furosemide 40 MG tablet Commonly known as: LASIX Take 40 mg by mouth 2 (two) times daily.   metoprolol tartrate 25 MG tablet Commonly known as: LOPRESSOR Take 1 tablet (25 mg total) by mouth 2 (two) times daily. What changed:   medication strength  how much to take   multivitamin with minerals Tabs tablet Take 1 tablet by mouth daily. Start taking on: October 19, 2019   oxyCODONE 5 MG immediate release tablet Commonly known as: Oxy IR/ROXICODONE Take 2 tablets (10 mg total) by mouth every 6 (six) hours as needed for moderate pain or severe pain.    polyethylene glycol 17 g packet Commonly known as: MIRALAX / GLYCOLAX Take 17 g by mouth daily.   senna-docusate 8.6-50 MG tablet Commonly known as: Senokot-S Take 2 tablets by mouth at bedtime as needed for mild constipation or moderate constipation.      Allergies  Allergen Reactions  . Morphine And Related Other (See Comments)    Severe abdominal pain   Follow-up Information    Derwood Kaplan, MD. Schedule an appointment as soon as possible for a visit.   Specialty: Oncology Contact information: [address] [place] [address] [phone]        Coralie Keens, MD Follow up in 2 week(s).   Specialty: General Surgery Contact information: [place] [address] [phone]            The results of significant diagnostics from this hospitalization (including imaging, microbiology, ancillary and laboratory) are listed below for reference.    Significant Diagnostic Studies: Dg Chest Port 1 View  Result Date: 10/16/2019 CLINICAL DATA:  Chest pain. EXAM: PORTABLE CHEST 1 VIEW COMPARISON:  10/13/2019 FINDINGS: Lungs are adequately inflated with moderate interval improvement in previously noted bibasilar airspace opacification. Findings likely represent improving asymmetric edema and less likely improving infection. No evidence of effusion. Cardiomediastinal silhouette and remainder of the exam is unchanged. IMPRESSION: Moderate interval improvement in previously noted bibasilar airspace opacification. Findings likely represent improving asymmetric edema and less likely improving infection. Electronically Signed   By: Marin Olp M.D.   On: 10/16/2019 10:58  Dg Chest Port 1 View  Result Date: 10/13/2019 CLINICAL DATA:  43 year old male with shortness of breath and worsening oxygenation. EXAM: PORTABLE CHEST 1 VIEW COMPARISON:  Portable chest 10/04/2019 and earlier. FINDINGS: Portable AP semi upright view at 0407 hours.  Confluent new opacity in the right lower lung, approaching consolidation. It is unclear whether this is in the middle lobe or the lower lobe. Stable lung volumes. Background increased pulmonary vascularity. Mediastinal contours are stable, borderline to mild cardiomegaly. No pneumothorax or pleural effusion. Visualized tracheal air column is within normal limits. No acute osseous abnormality identified. Negative visible bowel gas pattern. IMPRESSION: 1. Progressive right lung base opacity since the CT on 10/01/2019. Top differential considerations include pneumonia and asymmetric pulmonary edema. 2. Superimposed increased pulmonary vascularity. No pleural effusion. Electronically Signed   By: Odessa FlemingH  Hall M.D.   On: 10/13/2019 04:23    Microbiology: No results found for this or any previous visit (from the past 240 hour(s)).   Labs: Basic Metabolic Panel: Recent Labs  Lab 10/13/19 0542 10/14/19 0210 10/15/19 0243 10/16/19 0127 10/17/19 0342  NA 135 135 137 139 140  K 4.8 4.3 4.1 4.1 4.0  CL 95* 101 102 100 104  CO2 29 27 28 27 26   GLUCOSE 99 98 88 114* 90  BUN 8 9 8 8 10   CREATININE 1.38* 1.53* 1.38* 1.45* 1.43*  CALCIUM 9.4 8.7* 8.7* 8.9 8.8*  MG  --   --  2.0 1.9  --    Liver Function Tests: No results for input(s): AST, ALT, ALKPHOS, BILITOT, PROT, ALBUMIN in the last 168 hours. No results for input(s): LIPASE, AMYLASE in the last 168 hours. No results for input(s): AMMONIA in the last 168 hours. CBC: Recent Labs  Lab 10/13/19 0542 10/14/19 0210 10/15/19 0243 10/16/19 0127 10/17/19 0342  WBC 20.9* 20.9* 12.3* 14.8* 11.1*  HGB 11.3* 8.8* 9.5* 10.0* 10.8*  HCT 33.5* 27.5* 28.8* 30.7* 33.7*  MCV 92.8 96.5 93.8 93.0 92.3  PLT 527* 601* 700* 876* 1,149*   Cardiac Enzymes: No results for input(s): CKTOTAL, CKMB, CKMBINDEX, TROPONINI in the last 168 hours. BNP: BNP (last 3 results) Recent Labs    10/14/19 1008  BNP 516.0*    ProBNP (last 3 results) No results for  input(s): PROBNP in the last 8760 hours.  CBG: No results for input(s): GLUCAP in the last 168 hours.     Signed:  Edsel PetrinMaryann Special Ranes  [facility] 10/18/2019, 10:05 AM

## 2019-10-18 NOTE — Plan of Care (Signed)

## 2019-10-18 NOTE — Progress Notes (Addendum)
Patient discharged to home with instructions and medications. 

## 2020-09-23 ENCOUNTER — Other Ambulatory Visit: Payer: Self-pay

## 2020-09-23 ENCOUNTER — Encounter (HOSPITAL_COMMUNITY): Payer: Self-pay | Admitting: Emergency Medicine

## 2020-09-23 ENCOUNTER — Emergency Department (HOSPITAL_COMMUNITY): Payer: Medicaid - Out of State

## 2020-09-23 ENCOUNTER — Inpatient Hospital Stay (HOSPITAL_COMMUNITY)
Admission: EM | Admit: 2020-09-23 | Discharge: 2020-09-28 | DRG: 065 | Disposition: A | Discharge status: Home / Self Care | Payer: Medicaid - Out of State | Attending: Internal Medicine | Admitting: Internal Medicine

## 2020-09-23 DIAGNOSIS — R270 Ataxia, unspecified: Secondary | ICD-10-CM | POA: Diagnosis present

## 2020-09-23 DIAGNOSIS — Z885 Allergy status to narcotic agent status: Secondary | ICD-10-CM

## 2020-09-23 DIAGNOSIS — I10 Essential (primary) hypertension: Secondary | ICD-10-CM | POA: Diagnosis present

## 2020-09-23 DIAGNOSIS — Z79899 Other long term (current) drug therapy: Secondary | ICD-10-CM

## 2020-09-23 DIAGNOSIS — Z832 Family history of diseases of the blood and blood-forming organs and certain disorders involving the immune mechanism: Secondary | ICD-10-CM

## 2020-09-23 DIAGNOSIS — R0789 Other chest pain: Secondary | ICD-10-CM

## 2020-09-23 DIAGNOSIS — R0602 Shortness of breath: Secondary | ICD-10-CM

## 2020-09-23 DIAGNOSIS — Z87891 Personal history of nicotine dependence: Secondary | ICD-10-CM

## 2020-09-23 DIAGNOSIS — X58XXXA Exposure to other specified factors, initial encounter: Secondary | ICD-10-CM | POA: Diagnosis present

## 2020-09-23 DIAGNOSIS — F1490 Cocaine use, unspecified, uncomplicated: Secondary | ICD-10-CM | POA: Diagnosis present

## 2020-09-23 DIAGNOSIS — Z20822 Contact with and (suspected) exposure to covid-19: Secondary | ICD-10-CM | POA: Diagnosis not present

## 2020-09-23 DIAGNOSIS — I639 Cerebral infarction, unspecified: Secondary | ICD-10-CM | POA: Diagnosis not present

## 2020-09-23 DIAGNOSIS — R471 Dysarthria and anarthria: Secondary | ICD-10-CM | POA: Diagnosis present

## 2020-09-23 DIAGNOSIS — N179 Acute kidney failure, unspecified: Secondary | ICD-10-CM | POA: Diagnosis not present

## 2020-09-23 DIAGNOSIS — G8929 Other chronic pain: Secondary | ICD-10-CM | POA: Diagnosis present

## 2020-09-23 DIAGNOSIS — I130 Hypertensive heart and chronic kidney disease with heart failure and stage 1 through stage 4 chronic kidney disease, or unspecified chronic kidney disease: Secondary | ICD-10-CM | POA: Diagnosis present

## 2020-09-23 DIAGNOSIS — R4782 Fluency disorder in conditions classified elsewhere: Secondary | ICD-10-CM | POA: Diagnosis present

## 2020-09-23 DIAGNOSIS — N1831 Chronic kidney disease, stage 3a: Secondary | ICD-10-CM | POA: Diagnosis present

## 2020-09-23 DIAGNOSIS — S022XXA Fracture of nasal bones, initial encounter for closed fracture: Secondary | ICD-10-CM | POA: Diagnosis present

## 2020-09-23 DIAGNOSIS — I5032 Chronic diastolic (congestive) heart failure: Secondary | ICD-10-CM | POA: Diagnosis present

## 2020-09-23 DIAGNOSIS — N189 Chronic kidney disease, unspecified: Secondary | ICD-10-CM

## 2020-09-23 DIAGNOSIS — E785 Hyperlipidemia, unspecified: Secondary | ICD-10-CM | POA: Diagnosis present

## 2020-09-23 DIAGNOSIS — Z9114 Patient's other noncompliance with medication regimen: Secondary | ICD-10-CM

## 2020-09-23 DIAGNOSIS — I5030 Unspecified diastolic (congestive) heart failure: Secondary | ICD-10-CM

## 2020-09-23 DIAGNOSIS — R1310 Dysphagia, unspecified: Secondary | ICD-10-CM | POA: Diagnosis present

## 2020-09-23 DIAGNOSIS — I63412 Cerebral infarction due to embolism of left middle cerebral artery: Principal | ICD-10-CM | POA: Diagnosis present

## 2020-09-23 DIAGNOSIS — R4701 Aphasia: Secondary | ICD-10-CM | POA: Diagnosis present

## 2020-09-23 DIAGNOSIS — G8191 Hemiplegia, unspecified affecting right dominant side: Secondary | ICD-10-CM | POA: Diagnosis present

## 2020-09-23 HISTORY — DX: Cerebral infarction, unspecified: I63.9

## 2020-09-23 LAB — DIFFERENTIAL
Abs Immature Granulocytes: 0.02 10*3/uL (ref 0.00–0.07)
Basophils Absolute: 0.1 10*3/uL (ref 0.0–0.1)
Basophils Relative: 1 %
Eosinophils Absolute: 0.5 10*3/uL (ref 0.0–0.5)
Eosinophils Relative: 8 %
Immature Granulocytes: 0 %
Lymphocytes Relative: 20 %
Lymphs Abs: 1.3 10*3/uL (ref 0.7–4.0)
Monocytes Absolute: 0.9 10*3/uL (ref 0.1–1.0)
Monocytes Relative: 13 %
Neutro Abs: 3.9 10*3/uL (ref 1.7–7.7)
Neutrophils Relative %: 58 %

## 2020-09-23 LAB — COMPREHENSIVE METABOLIC PANEL
ALT: 16 U/L (ref 0–44)
AST: 21 U/L (ref 15–41)
Albumin: 4.3 g/dL (ref 3.5–5.0)
Alkaline Phosphatase: 84 U/L (ref 38–126)
Anion gap: 13 (ref 5–15)
BUN: 11 mg/dL (ref 6–20)
CO2: 25 mmol/L (ref 22–32)
Calcium: 9.8 mg/dL (ref 8.9–10.3)
Chloride: 100 mmol/L (ref 98–111)
Creatinine, Ser: 1.54 mg/dL — ABNORMAL HIGH (ref 0.61–1.24)
GFR, Estimated: 56 mL/min — ABNORMAL LOW (ref >60)
Glucose, Bld: 101 mg/dL — ABNORMAL HIGH (ref 70–99)
Potassium: 4.2 mmol/L (ref 3.5–5.1)
Sodium: 138 mmol/L (ref 135–145)
Total Bilirubin: 0.8 mg/dL (ref 0.3–1.2)
Total Protein: 7 g/dL (ref 6.5–8.1)

## 2020-09-23 LAB — CBC
HCT: 49.6 % (ref 39.0–52.0)
Hemoglobin: 17.9 g/dL — ABNORMAL HIGH (ref 13.0–17.0)
MCH: 31.2 pg (ref 26.0–34.0)
MCHC: 36.1 g/dL — ABNORMAL HIGH (ref 30.0–36.0)
MCV: 86.6 fL (ref 80.0–100.0)
Platelets: 315 10*3/uL (ref 150–400)
RBC: 5.73 MIL/uL (ref 4.22–5.81)
RDW: 13 % (ref 11.5–15.5)
WBC: 6.7 10*3/uL (ref 4.0–10.5)
nRBC: 0 % (ref 0.0–0.2)

## 2020-09-23 LAB — APTT: aPTT: 32 seconds (ref 24–36)

## 2020-09-23 LAB — PROTIME-INR
INR: 1 (ref 0.8–1.2)
Prothrombin Time: 12.6 seconds (ref 11.4–15.2)

## 2020-09-23 LAB — TROPONIN I (HIGH SENSITIVITY)
Troponin I (High Sensitivity): 18 ng/L — ABNORMAL HIGH (ref <18)
Troponin I (High Sensitivity): 16 ng/L (ref <18)

## 2020-09-23 IMAGING — CT CT ANGIO CHEST-ABD-PELV FOR DISSECTION W/ AND WO/W CM
2 of 7 series · 13 of 46 positions shown, 15 images · non-contrast
Comparison: None.

CLINICAL DATA: Abdominal pain, question aortic dissection

EXAM:
CT ANGIOGRAPHY CHEST, ABDOMEN AND PELVIS
TECHNIQUE: Non-contrast CT of the chest was initially obtained.

[Series 6: dissection 3.0 i30f 3 · axial · 0.77mm/px · z∈[+890,+1478]mm · 10 of 226 slices shown, 12 images]
[im 15/226  soft-tissue]
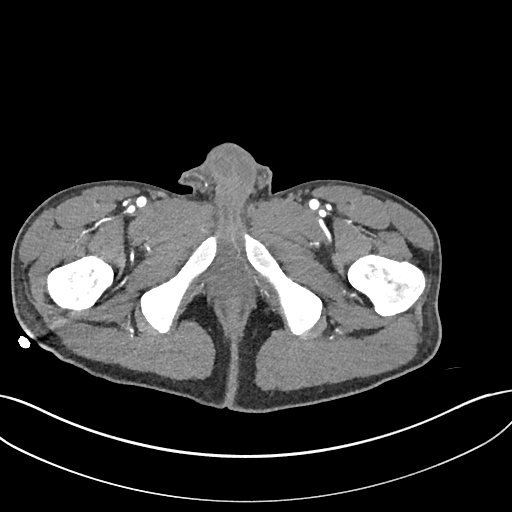
[im 15/226  bone]
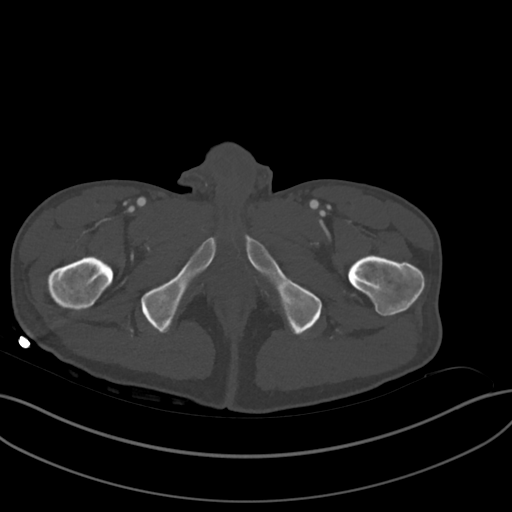
[im 43/226  soft-tissue]
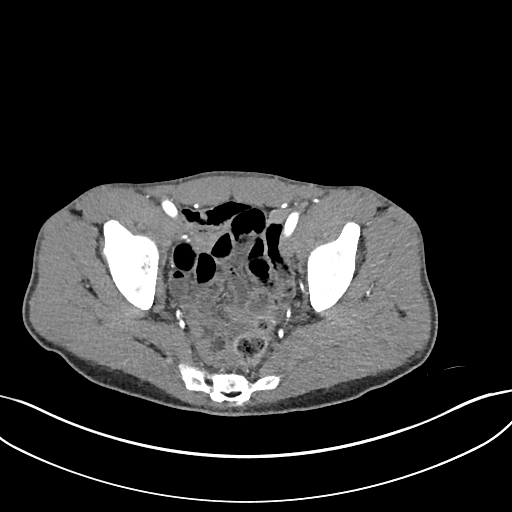
[im 57/226  soft-tissue]
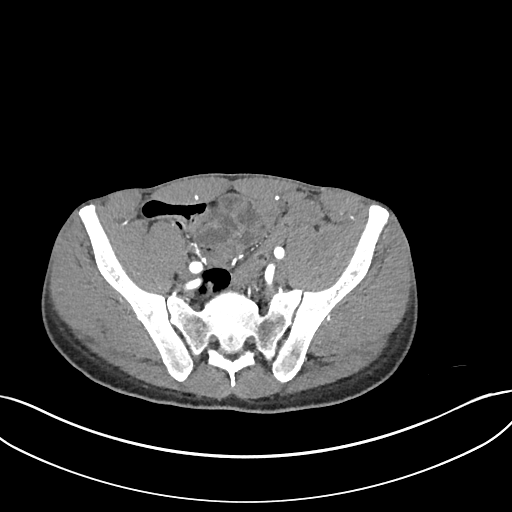
[im 85/226  soft-tissue]
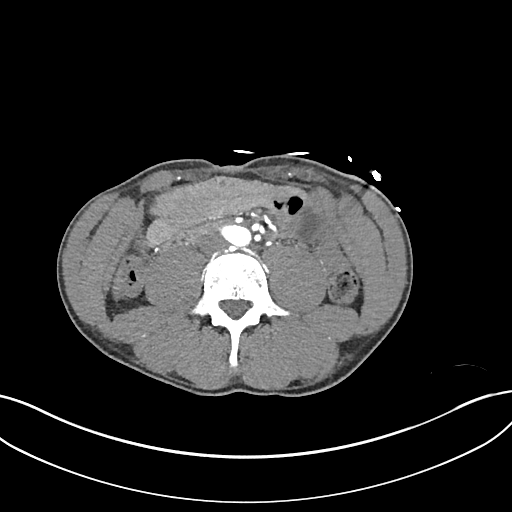
[im 99/226  soft-tissue]
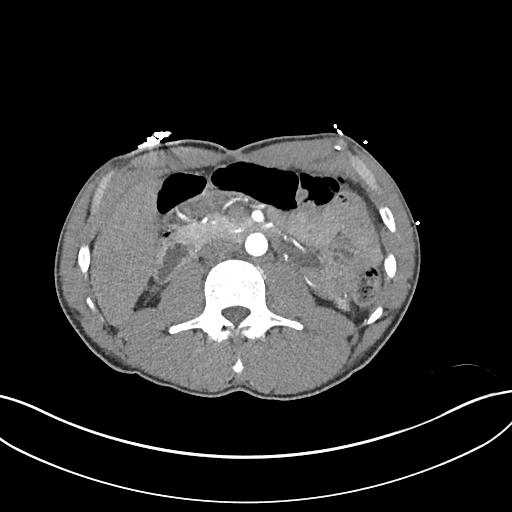
[im 127/226  soft-tissue]
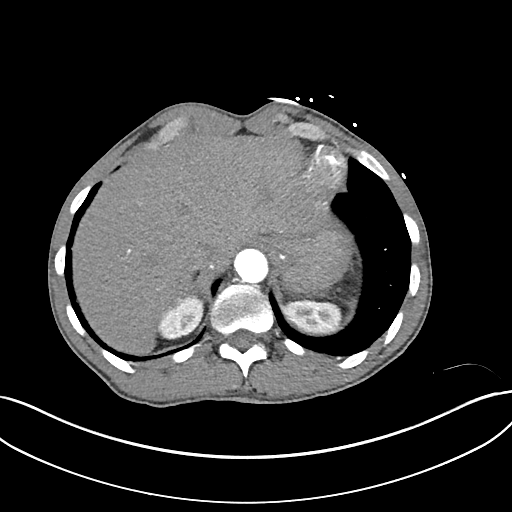
[im 141/226  soft-tissue]
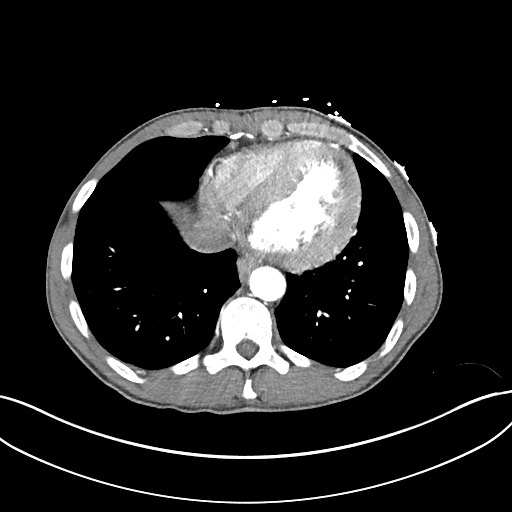
[im 169/226  soft-tissue]
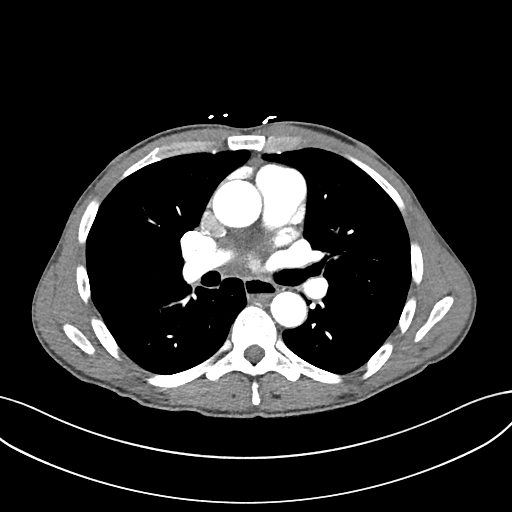
[im 183/226  soft-tissue]
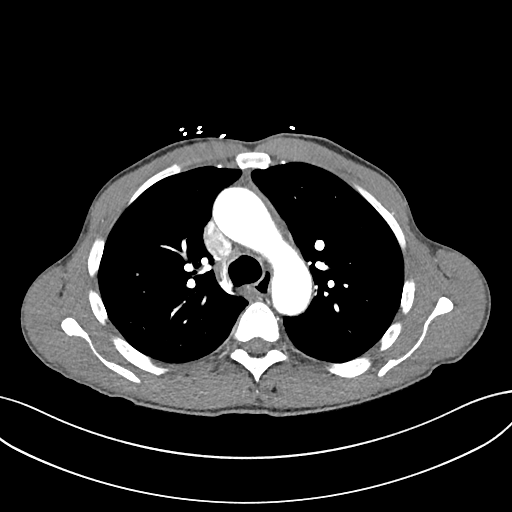
[im 183/226  bone]
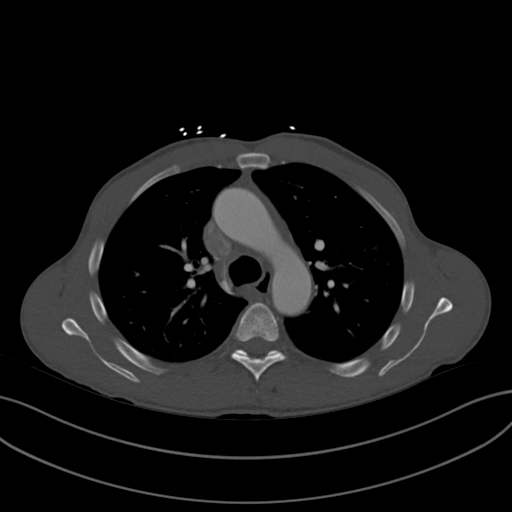
[im 211/226  soft-tissue]
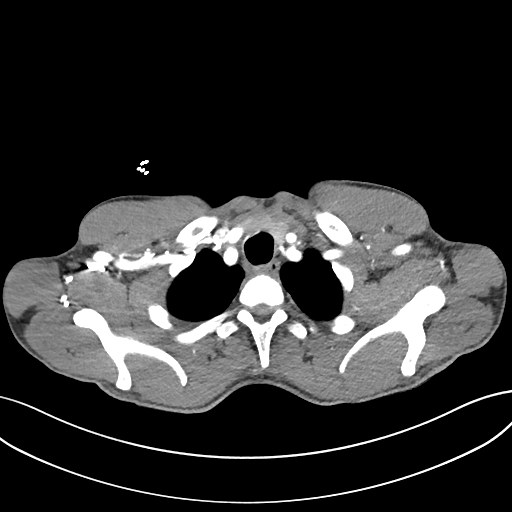

[Series 9: coronals · coronal · 0.84mm/px · 3 of 140 slices shown]
[im 35/140  soft-tissue]
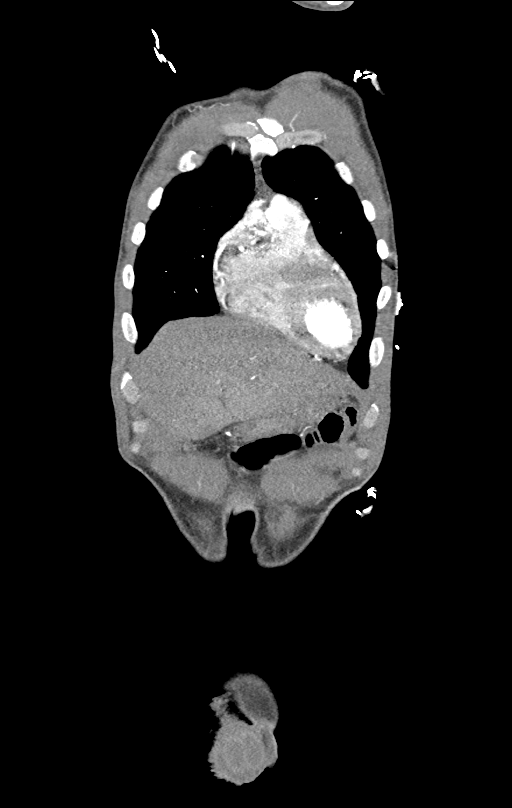
[im 70/140  soft-tissue]
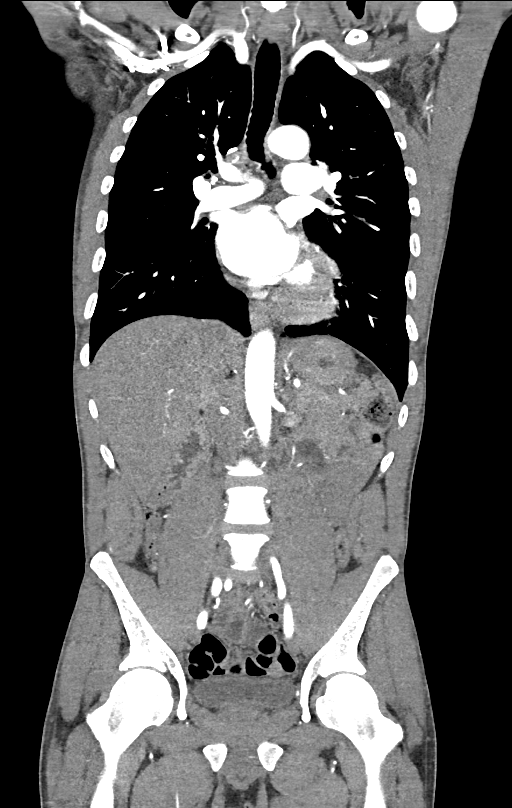
[im 105/140  soft-tissue]
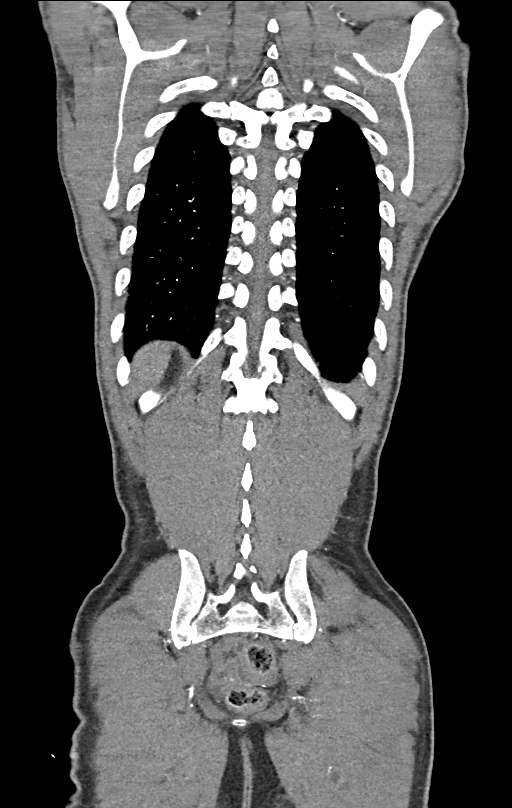

[13 of 46 positions shown; findings below may reference images not displayed]

Multidetector CT imaging through the chest, abdomen and pelvis was
performed using the standard protocol during bolus administration of
intravenous contrast. Multiplanar reconstructed images and MIPs were
obtained and reviewed to evaluate the vascular anatomy.

CONTRAST:  100mL OMNIPAQUE IOHEXOL 350 MG/ML SOLN
FINDINGS: CTA CHEST FINDINGS

Cardiovascular:

--Heart: The heart size is normal.  There is nopericardial effusion.

--Aorta: The course and caliber of the thoracic aorta are normal.
There is no aortic atherosclerotic calcification. Precontrast images
show no aortic intramural hematoma. There is no blood pool,
dissection or penetrating ulcer demonstrated on arterial phase
postcontrast imaging. There is a origin of the left carotid artery
the brachiocephalic trunk. Minimal calcifications seen at the origin
of brachiocephalic trunk. The proximal arch vessels are widely
patent.

--Pulmonary Arteries: Contrast timing is optimized for preferential
opacification of the aorta. Within that limitation, normal central
pulmonary arteries.

Mediastinum/Nodes: No mediastinal, hilar or axillary
lymphadenopathy. The visualized thyroid and thoracic esophageal
course are unremarkable.

Lungs/Pleura: Mild ground-glass opacity seen at the posterior right
lung base. There is a calcified granuloma seen in the right middle
lobe. No focal airspace consolidation. No focal pleural abnormality.

Musculoskeletal: No chest wall abnormality. No acute osseous
findings.

Review of the MIP images confirms the above findings.

CTA ABDOMEN AND PELVIS FINDINGS

VASCULAR

Aorta: Normal caliber aorta without aneurysm, dissection, vasculitis
or hemodynamically significant stenosis. There is scattered mild
aortic atherosclerosis at the aorta bi-iliac bifurcation.

Celiac: No aneurysm, dissection or hemodynamically significant
stenosis. Normal branching pattern

SMA: Widely patent without dissection or stenosis.

Renals: Single renal arteries bilaterally. No aneurysm, dissection,
stenosis or evidence of fibromuscular dysplasia.

IMA: Patent without abnormality.

Inflow: Scattered atherosclerosis at the common iliac vasculature.

Veins: Normal course and caliber of the major veins. Assessment is
otherwise limited by the arterial dominant contrast phase.

Review of the MIP images confirms the above findings.

NON-VASCULAR

Hepatobiliary: Normal hepatic contours and density. No visible
biliary dilatation. The patient is status post cholecystectomy. No
biliary ductal dilation.

Pancreas: Normal contours without ductal dilatation. No
peripancreatic fluid collection.

Spleen: The patient is status post splenectomy.

Adrenals/Urinary Tract:

--Adrenal glands: Normal.

--Right kidney/ureter: No hydronephrosis or perinephric stranding.
No nephrolithiasis. No obstructing ureteral stones.

--Left kidney/ureter: No hydronephrosis or perinephric stranding. No
nephrolithiasis. No obstructing ureteral stones.

--Urinary bladder: Unremarkable.

Stomach/Bowel:

--Stomach/Duodenum: No hiatal hernia or other gastric abnormality.
Normal duodenal course and caliber.

--Small bowel: No dilatation or inflammation.

--Colon: No focal abnormality.

--Appendix: Normal.

Lymphatic:  No abdominal or pelvic lymphadenopathy.

Reproductive: No free fluid in the pelvis.

Musculoskeletal. No bony spinal canal stenosis or focal osseous
abnormality.

Other: None.

Review of the MIP images confirms the above findings.
IMPRESSION: No acute aortic abnormality.

Mild aortic atherosclerosis.  Aortic Atherosclerosis ([N4]-[N4]).

No other acute intrathoracic or pelvic pathology.

## 2020-09-23 IMAGING — DX DG CHEST 1V PORT
1 series · 2 of 2 positions shown · non-contrast
Comparison: [DATE]

CLINICAL DATA: Chest pain

EXAM:
PORTABLE CHEST 1 VIEW

[Series 1: chest · 0.14mm/px · 2 of 2 slices shown]
[im 1/2]
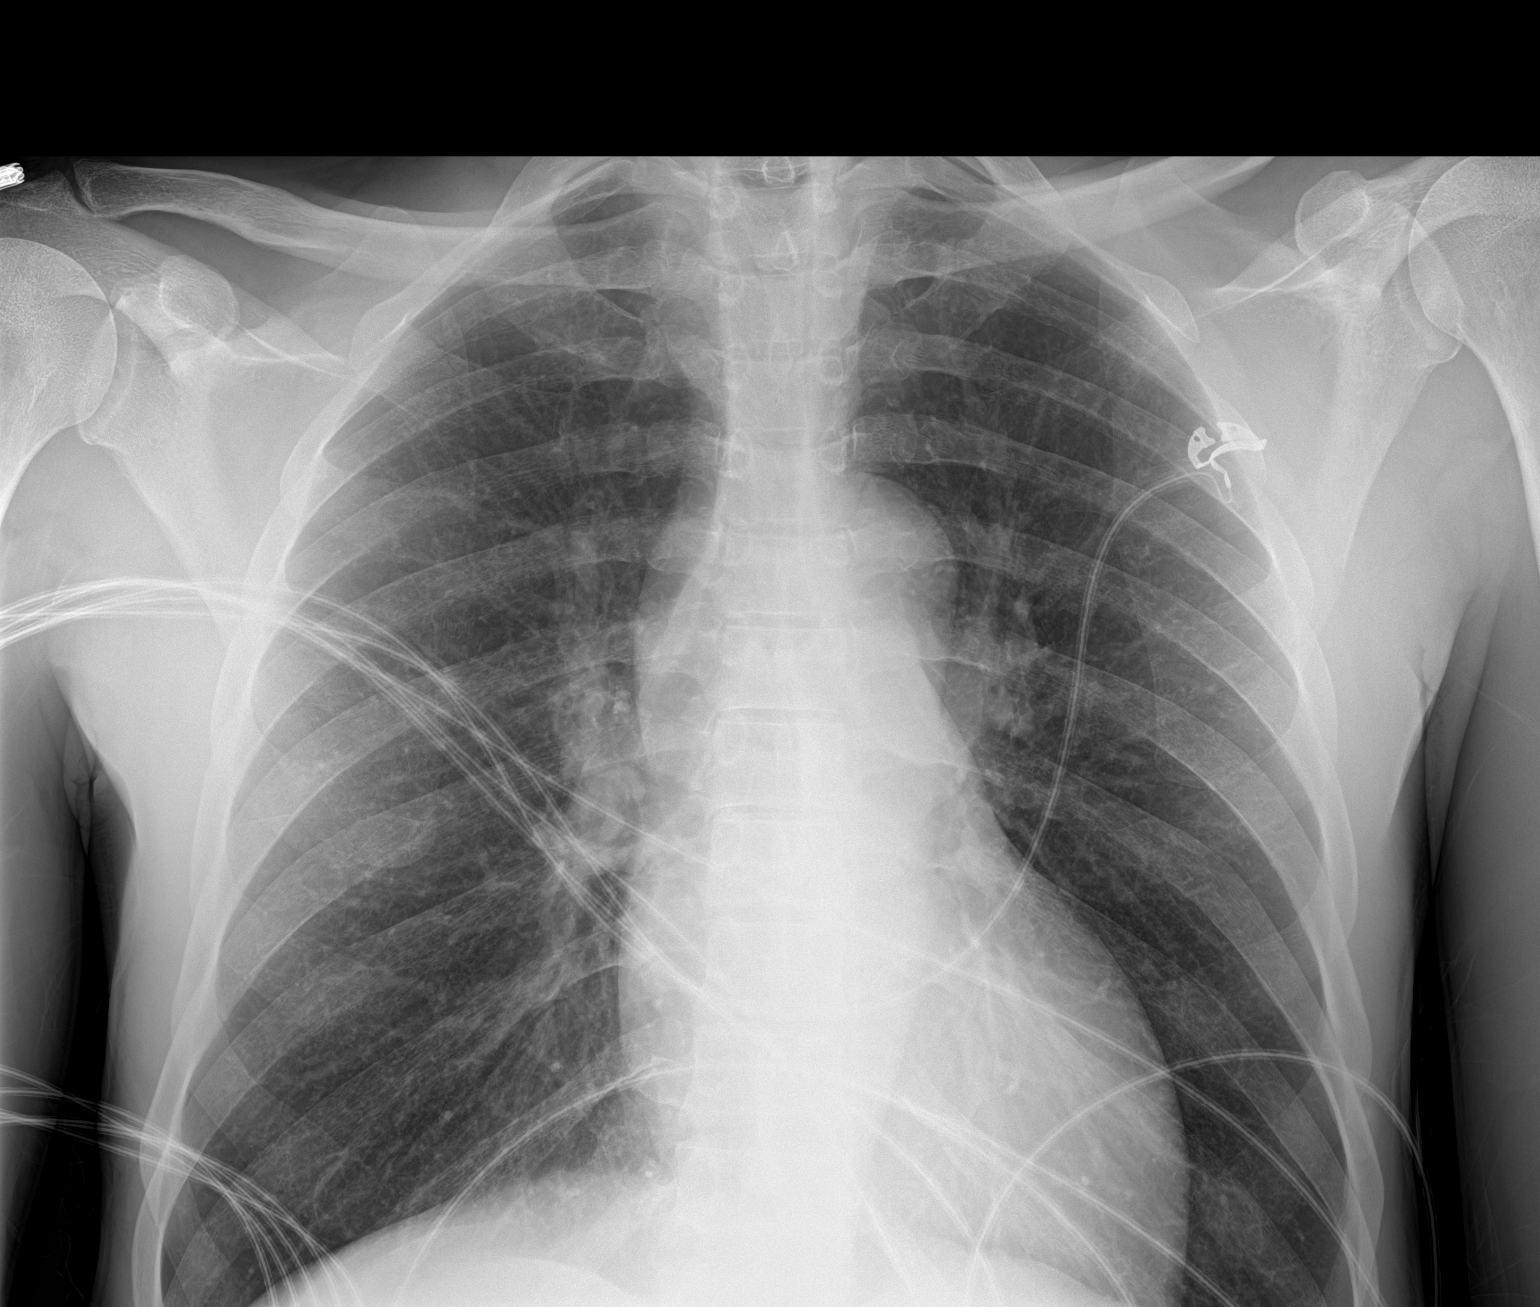
[im 2/2]
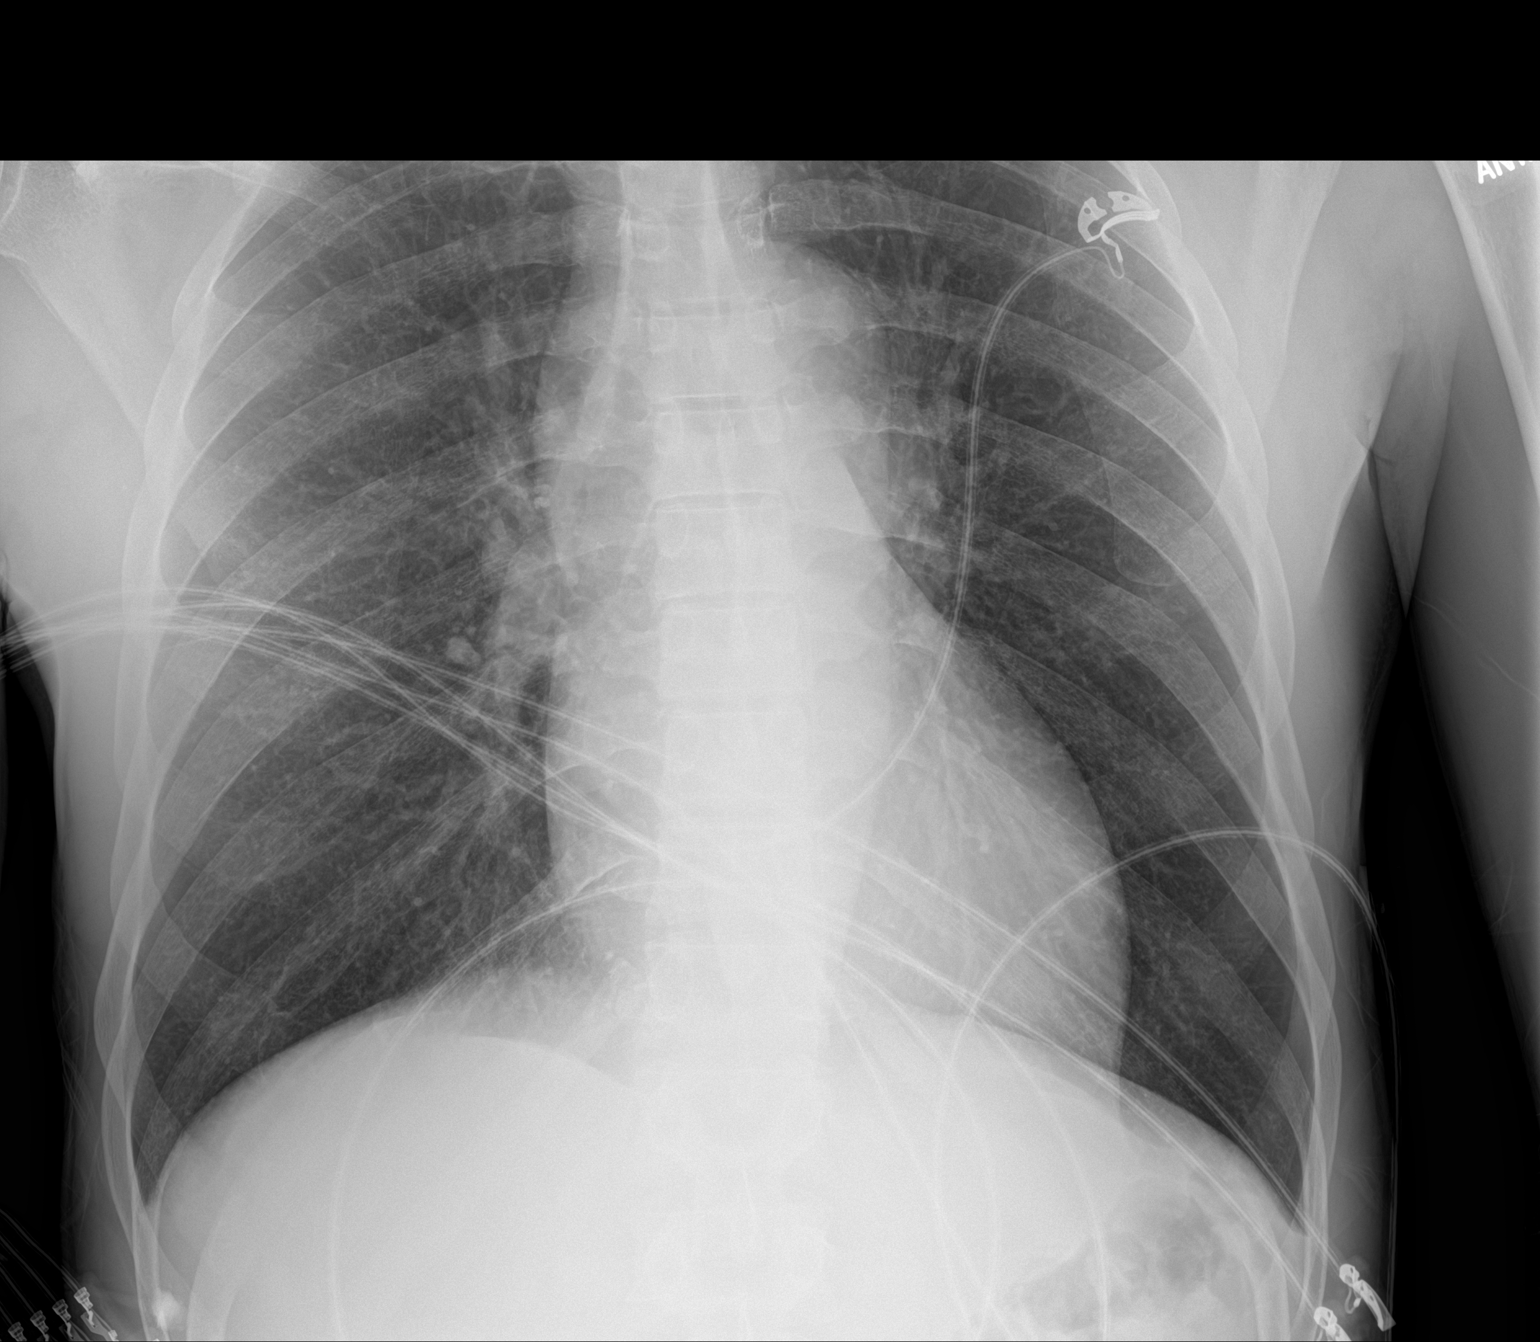

[2 of 2 positions shown; findings below may reference images not displayed]

FINDINGS: The heart size and mediastinal contours are within normal limits.
Both lungs are clear. The visualized skeletal structures are
unremarkable.
IMPRESSION: No active disease.

## 2020-09-23 IMAGING — CT CT HEAD W/O CM
4 series · 14 of 47 positions shown, 16 images · non-contrast
Comparison: Noncontrast head CT [DATE]. CT angiogram head/neck
[DATE]. Noncontrast head CT [DATE]. Brain MRI [DATE].

CLINICAL DATA: Neuro deficit, acute stroke suspected.

EXAM:
CT HEAD WITHOUT CONTRAST
TECHNIQUE: Contiguous axial images were obtained from the base of the skull
through the vertex without intravenous contrast.

[Series 1: head without · axial · non-contrast · 0.45mm/px · z∈[+871,+991]mm · 7 of 34 slices shown, 9 images]
[im 5/34  brain]
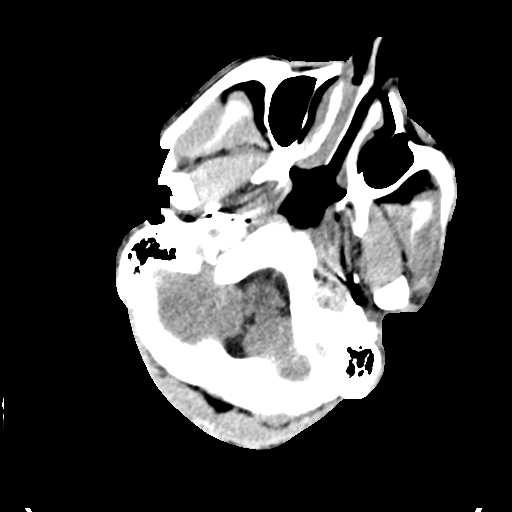
[im 5/34  bone]
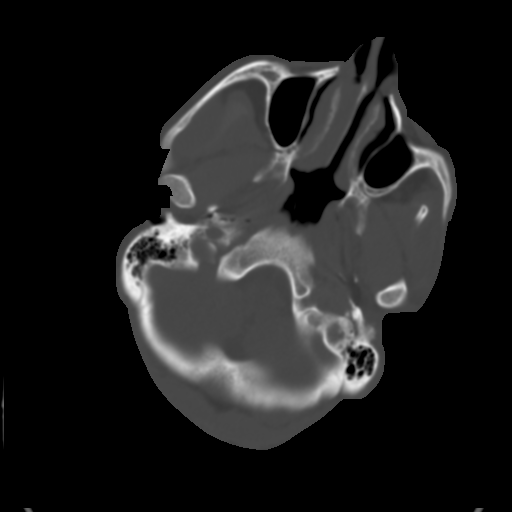
[im 9/34  brain]
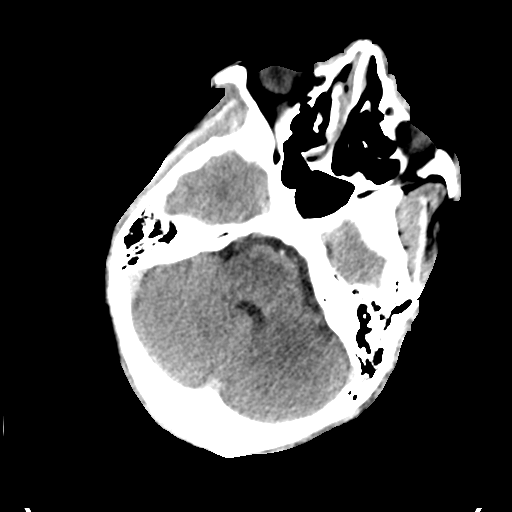
[im 13/34  brain]
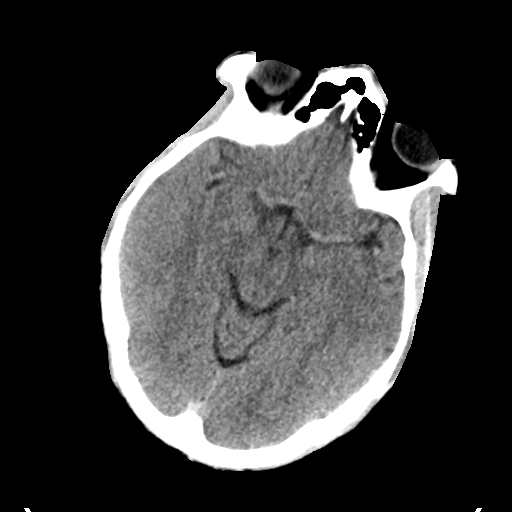
[im 17/34  brain]
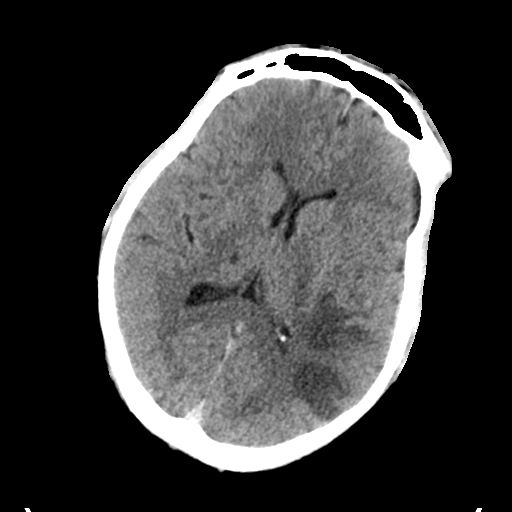
[im 21/34  brain]
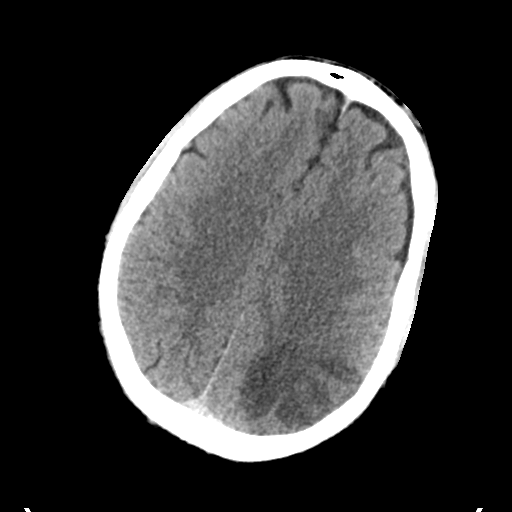
[im 21/34  bone]
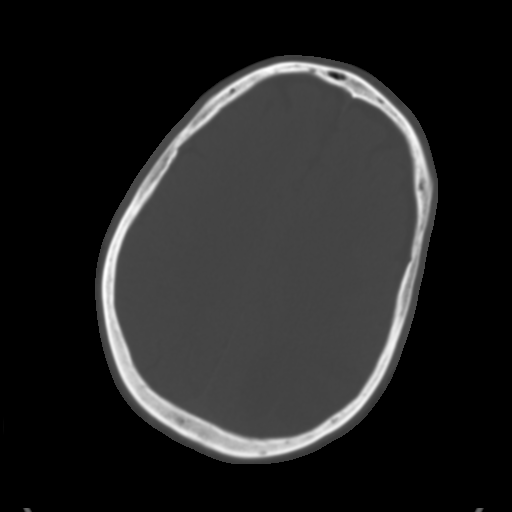
[im 25/34  brain]
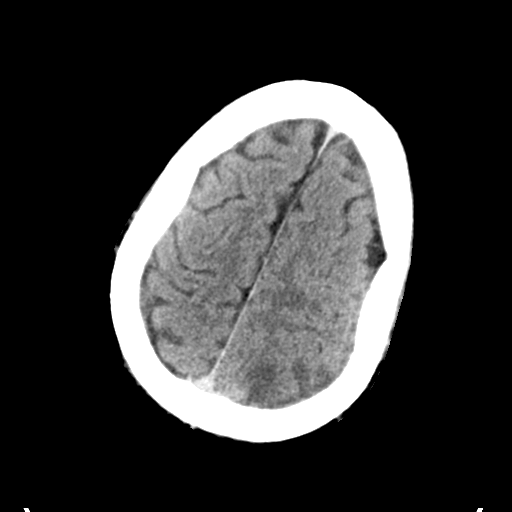
[im 29/34  brain]
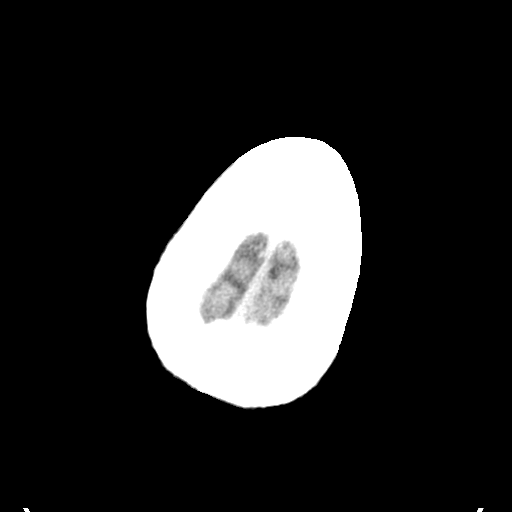

[Series 4: head bone · axial · 0.45mm/px · 1 of 84 slices shown]
[im 9/84  bone]
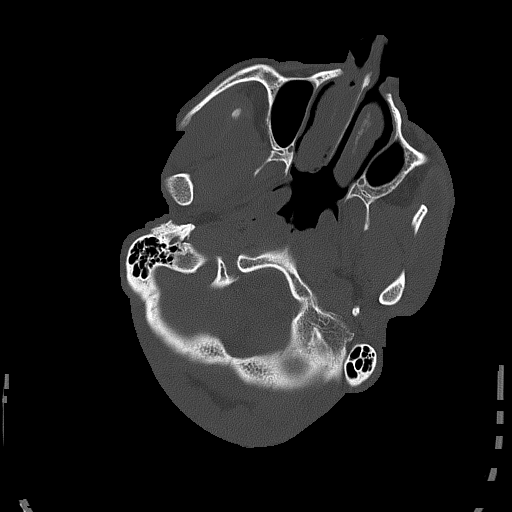

[Series 5: head without cor · coronal · non-contrast · 0.32mm/px · 3 of 71 slices shown]
[im 24/71  brain]
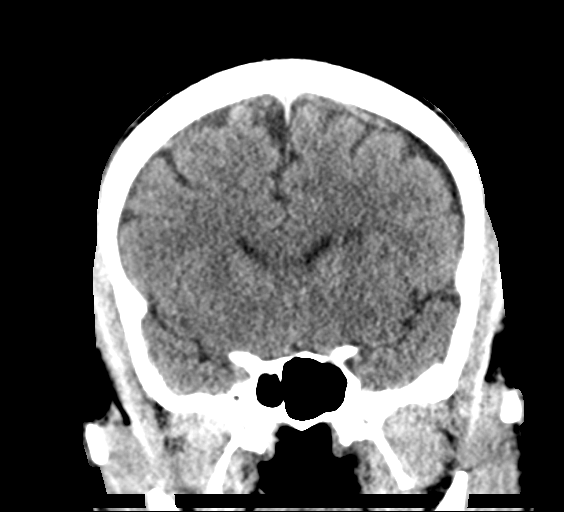
[im 32/71  brain]
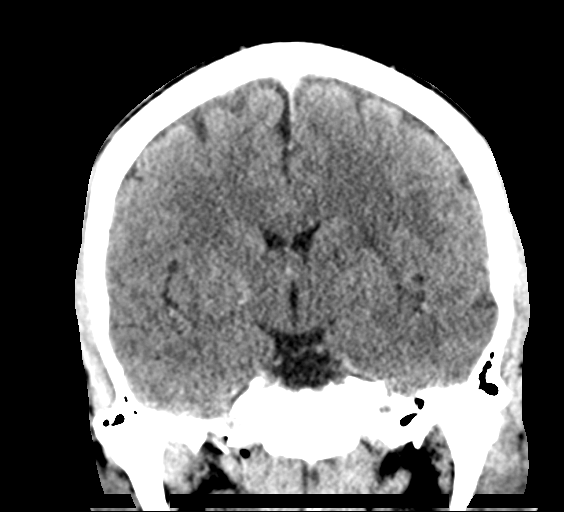
[im 39/71  brain]
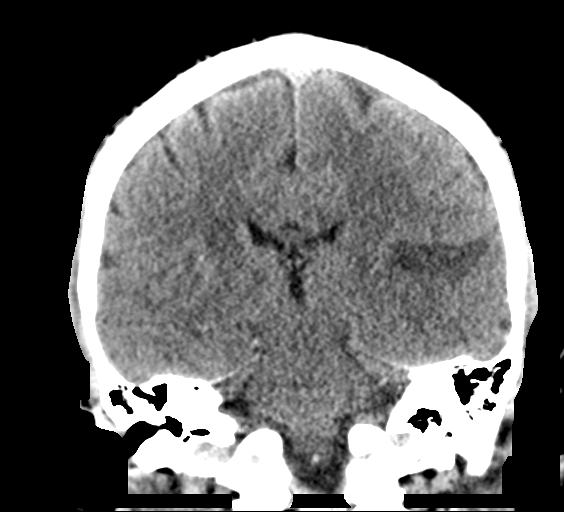

[Series 6: head without sag · sagittal · non-contrast · 0.32mm/px · 3 of 66 slices shown]
[im 22/66  brain]
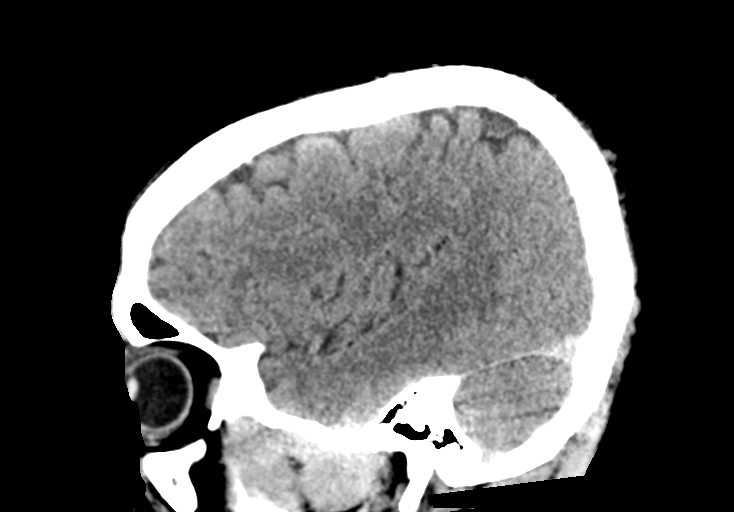
[im 33/66  brain]
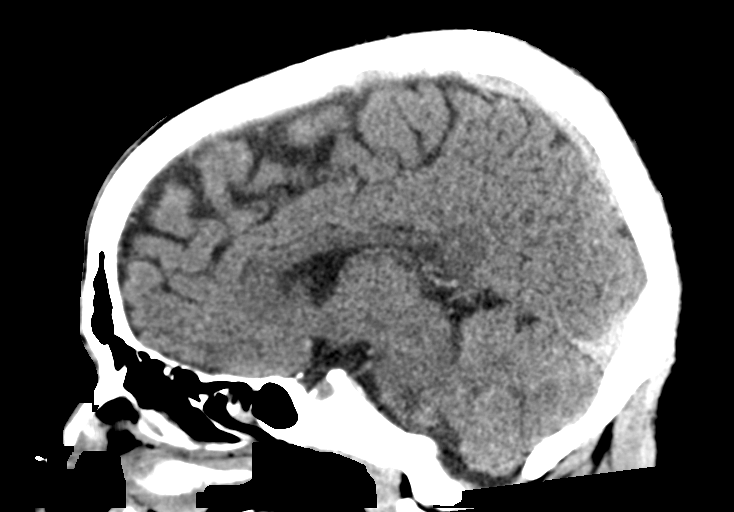
[im 44/66  brain]
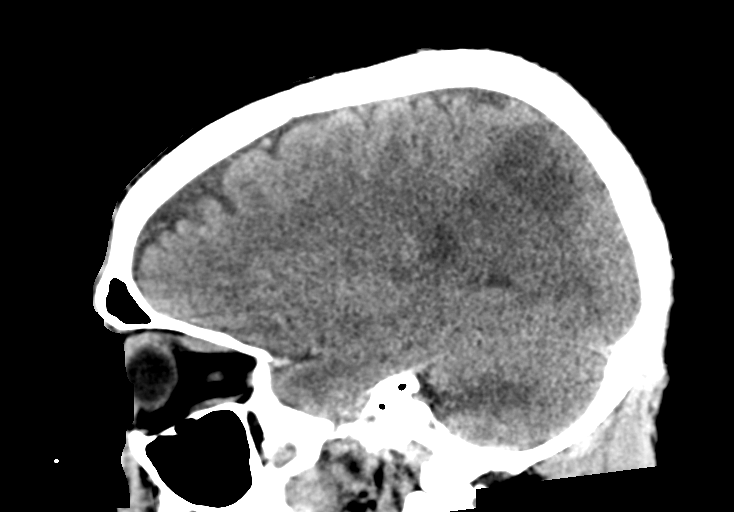

[14 of 47 positions shown; findings below may reference images not displayed]

FINDINGS: Brain:

Cerebral volume is normal.

A moderate to large acute/early subacute cortical and subcortical
infarct within the left parietal lobe, posterosuperior left temporal
lobe and posterior left insula has not significantly changed in
extent as compared to the brain MRI performed one day prior. Subtle
petechial hemorrhage is questioned, but there is no frank
hemorrhagic conversion. Regional mass effect. No midline shift

A small subacute infarct within the right corona radiata was better
appreciated on the prior MRI.

Redemonstrated chronic lacunar infarcts within the bilateral
cerebral white matter, left basal ganglia and right thalamus.

No extra-axial fluid collection.

No evidence of intracranial mass.

No midline shift.

Vascular: Atherosclerotic calcifications.

Skull: Normal. Negative for fracture or focal lesion.

Sinuses/Orbits: Visualized orbits show no acute finding. Mild
paranasal sinus mucosal thickening, most notably ethmoidal.

Other: There are acute, comminuted and displaced bilateral nasal
bone fractures, new as compared to prior examinations. Partial
opacification of the right nasal passage. Suspected additional
minimally displaced acute fractures of the bony nasal septum (for
instance as seen on series 4, images 16 and 17).
IMPRESSION: A moderate to large acute/early subacute cortical and subcortical
infarct within the left MCA vascular territory has not significantly
changed in extent as compared to the brain MRI performed one day
prior. There is regional mass effect but no midline shift. Subtle
petechial hemorrhage is questioned, but there is no frank
hemorrhagic conversion.

A small subacute infarct within the right corona radiata was better
appreciated on the prior MRI.

Redemonstrated chronic lacunar infarcts within the bilateral
cerebral white matter, left basal ganglia and right thalamus.

Acute, comminuted and displaced bilateral nasal bone fractures which
are new as compared to the prior examinations. Suspected minimally
displaced acute fractures of the bony nasal septum. Partial
opacification of the right nasal passage. Consider dedicated
maxillofacial CT for further evaluation.

## 2020-09-23 MED ORDER — FUROSEMIDE 40 MG PO TABS
40.0000 mg | ORAL_TABLET | Freq: Two times a day (BID) | ORAL | Status: DC
  Administered 2020-09-24 – 2020-09-25 (×3): 40 mg via ORAL
  Filled 2020-09-23 (×3): qty 1

## 2020-09-23 MED ORDER — FOLIC ACID 1 MG PO TABS
1.0000 mg | ORAL_TABLET | Freq: Every day | ORAL | Status: DC
  Administered 2020-09-24 – 2020-09-28 (×5): 1 mg via ORAL
  Filled 2020-09-23 (×5): qty 1

## 2020-09-23 MED ORDER — SODIUM CHLORIDE 0.9% FLUSH
3.0000 mL | Freq: Once | INTRAVENOUS | Status: DC
Start: 2020-09-23 — End: 2020-09-28

## 2020-09-23 MED ORDER — IOHEXOL 350 MG/ML SOLN
100.0000 mL | Freq: Once | INTRAVENOUS | Status: AC | PRN
  Administered 2020-09-23: 100 mL via INTRAVENOUS

## 2020-09-23 MED ORDER — ENOXAPARIN SODIUM 40 MG/0.4ML ~~LOC~~ SOLN
40.0000 mg | SUBCUTANEOUS | Status: DC
  Administered 2020-09-23 – 2020-09-27 (×5): 40 mg via SUBCUTANEOUS
  Filled 2020-09-23 (×5): qty 0.4

## 2020-09-23 MED ORDER — LACTATED RINGERS IV SOLN: INTRAVENOUS | Status: DC

## 2020-09-23 MED ORDER — AMLODIPINE BESYLATE 10 MG PO TABS
10.0000 mg | ORAL_TABLET | Freq: Every day | ORAL | Status: DC
  Administered 2020-09-24 – 2020-09-28 (×5): 10 mg via ORAL
  Filled 2020-09-23 (×5): qty 1

## 2020-09-23 MED ORDER — METOPROLOL TARTRATE 25 MG PO TABS
25.0000 mg | ORAL_TABLET | Freq: Two times a day (BID) | ORAL | Status: DC
  Administered 2020-09-23 – 2020-09-26 (×7): 25 mg via ORAL
  Filled 2020-09-23 (×7): qty 1

## 2020-09-23 MED ORDER — LACTATED RINGERS IV BOLUS: 1000.0000 mL | Freq: Once | INTRAVENOUS | Status: DC

## 2020-09-23 MED ORDER — POLYETHYLENE GLYCOL 3350 17 G PO PACK: 17.0000 g | PACK | Freq: Every day | ORAL | Status: DC | PRN

## 2020-09-23 MED ORDER — CLONIDINE HCL 0.1 MG PO TABS
0.2000 mg | ORAL_TABLET | Freq: Two times a day (BID) | ORAL | Status: DC
  Administered 2020-09-23 – 2020-09-28 (×10): 0.2 mg via ORAL
  Filled 2020-09-23 (×9): qty 2

## 2020-09-23 MED ORDER — DEXTROSE 50 % IV SOLN: 12.5000 g | Freq: Once | INTRAVENOUS | Status: DC

## 2020-09-23 MED ORDER — SODIUM CHLORIDE 0.9% FLUSH
3.0000 mL | Freq: Two times a day (BID) | INTRAVENOUS | Status: DC
  Administered 2020-09-23 – 2020-09-27 (×8): 3 mL via INTRAVENOUS

## 2020-09-23 MED ORDER — ACETAMINOPHEN 325 MG PO TABS
650.0000 mg | ORAL_TABLET | Freq: Four times a day (QID) | ORAL | Status: DC | PRN
  Administered 2020-09-24 – 2020-09-27 (×4): 650 mg via ORAL
  Filled 2020-09-23 (×4): qty 2

## 2020-09-23 MED ORDER — ACETAMINOPHEN 650 MG RE SUPP: 650.0000 mg | Freq: Four times a day (QID) | RECTAL | Status: DC | PRN

## 2020-09-23 MED ORDER — CLONIDINE HCL 0.2 MG PO TABS
0.2000 mg | ORAL_TABLET | Freq: Two times a day (BID) | ORAL | Status: DC
  Filled 2020-09-23: qty 1

## 2020-09-23 MED ORDER — ACETAMINOPHEN 325 MG PO TABS
650.0000 mg | ORAL_TABLET | Freq: Once | ORAL | Status: AC
  Administered 2020-09-23: 650 mg via ORAL
  Filled 2020-09-23: qty 2

## 2020-09-23 MED ORDER — FENTANYL CITRATE (PF) 100 MCG/2ML IJ SOLN
50.0000 ug | Freq: Once | INTRAMUSCULAR | Status: AC
  Administered 2020-09-23: 50 ug via INTRAVENOUS
  Filled 2020-09-23: qty 2

## 2020-09-23 MED ORDER — VITAMIN B-12 1000 MCG PO TABS
1000.0000 ug | ORAL_TABLET | Freq: Every day | ORAL | Status: DC
  Administered 2020-09-24 – 2020-09-28 (×5): 1000 ug via ORAL
  Filled 2020-09-23 (×5): qty 1

## 2020-09-23 MED ORDER — OXYCODONE HCL 5 MG PO TABS
10.0000 mg | ORAL_TABLET | Freq: Four times a day (QID) | ORAL | Status: DC | PRN
  Administered 2020-09-23 – 2020-09-28 (×16): 10 mg via ORAL
  Filled 2020-09-23 (×16): qty 2

## 2020-09-23 MED ORDER — FENTANYL CITRATE (PF) 100 MCG/2ML IJ SOLN
75.0000 ug | Freq: Once | INTRAMUSCULAR | Status: AC
  Administered 2020-09-23: 75 ug via INTRAVENOUS
  Filled 2020-09-23: qty 2

## 2020-09-23 MED ORDER — METOCLOPRAMIDE HCL 5 MG/ML IJ SOLN
10.0000 mg | Freq: Once | INTRAMUSCULAR | Status: AC
  Administered 2020-09-23: 10 mg via INTRAVENOUS
  Filled 2020-09-23: qty 2

## 2020-09-23 MED ORDER — ALBUTEROL SULFATE (2.5 MG/3ML) 0.083% IN NEBU: 2.5000 mg | INHALATION_SOLUTION | Freq: Four times a day (QID) | RESPIRATORY_TRACT | Status: DC | PRN

## 2020-09-23 MED ORDER — LACTATED RINGERS IV BOLUS
1000.0000 mL | Freq: Once | INTRAVENOUS | Status: AC
  Administered 2020-09-23: 1000 mL via INTRAVENOUS

## 2020-09-23 MED ORDER — DIPHENHYDRAMINE HCL 50 MG/ML IJ SOLN
25.0000 mg | Freq: Once | INTRAMUSCULAR | Status: AC
  Administered 2020-09-23: 25 mg via INTRAVENOUS
  Filled 2020-09-23: qty 1

## 2020-09-23 MED ORDER — ASPIRIN EC 81 MG PO TBEC
81.0000 mg | DELAYED_RELEASE_TABLET | Freq: Every day | ORAL | Status: DC
  Administered 2020-09-23 – 2020-09-28 (×6): 81 mg via ORAL
  Filled 2020-09-23 (×6): qty 1

## 2020-09-23 NOTE — ED Notes (Signed)
Pt is requesting to go home. Dr. Fredderick Phenix notified of pt request.

## 2020-09-23 NOTE — ED Notes (Signed)
Patient was verbally aggressive to staff patient called for pain meds and blood pressure meds. RN went into patients room to speak with him. RN notified patient that he had just received his pain meds less than an hour ago and was waiting for pharmacy to verify the rest of his meds. Patient told RN "if you arent going to do anything for me then you can just leave." RN explained to patient that the medications can not be pulled out of the pixis until they are verified and patient stated "I am not going to deal with this bull you can just go." RN turned Bedside monitor back on and left the room.

## 2020-09-23 NOTE — H&P (Signed)
History and Physical   Alejandro Perez IWL:[id number] DOB: [birth year] DOA: 09/23/2020  PCP: Patient, No Pcp Per   Patient coming from: Left AMA from [facility], prior to that from home  Chief Complaint: Stroke  HPI: Alejandro Perez is a 44 y.o. male with medical history significant of acquired TTP status post splenectomy, hypertension, pulmonary hypertension, HFpEF, CKD presented with known CVA that he was being treated for at [facility], but left AMA after he was unsatisfied with his care.  Patient states that on Sunday he woke up with dizziness, falling to his left, feeling weak, and speech difficulty consisting of slurred speech with his daughter.  Patient had initial work-up at [facility] and had been there for several days.  MRI from [place] showed moderate to large subacute cortical and subcortical infarct within the left MCA vascular territory, subacute infarct in the right corona radiata, lacunar infarcts.  Patient states that he had a fall while there and landed on his face with his wheelchair landed on top of him but was unable to get help.  And that is why he left and came to [facility].  He was accompanied with some records but it is unclear what the results of his full work-up was.  Patient reports some chest tightness, chronic abdominal pain, headache.  He denies constipation or diarrhea  ED Course: Vital significant for hypertension in the ED here.  Labs showed creatinine of 1.5 stable for his baseline.  LFTs within normal limits.  CBC showed hemoglobin of 17.  PT INR and PTT within normal limits.  Initial troponin XVIII, second pending.  Respiratory viral panel pending when seen.  EKG showed normal sinus rhythm with LVH and QTC prolongation at 45.  Review of Systems: As per HPI otherwise all other systems reviewed and are negative.  Past Medical History:  Diagnosis Date  . Anemia   . Chest pain   . Glaucoma   . Hypertension   . LVH (left ventricular hypertrophy)    . Spleen enlarged   . Stroke (HCC)   . Thrombocytopenia (HCC)     Past Surgical History:  Procedure Laterality Date  . CHOLECYSTECTOMY    . SPLENECTOMY, 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   Procedure: SPLENECTOMY;  Surgeon: [name], MD;  Location: [facility] OR;  Service: General;  Laterality: N/A;    Social History  reports that he has quit smoking. He has never used smokeless tobacco. He reports that he does not drink alcohol and does not use drugs.  Allergies  Allergen Reactions  . Morphine And Related Other (See Comments)    Severe abdominal pain    Family History  Problem Relation Age of Onset  . Sickle cell trait Mother   Reviewed on admission  Prior to Admission medications   Medication Sig Start Date End Date Taking? Authorizing Provider  acetaminophen (TYLENOL) 325 MG tablet Take 650 mg by mouth every 6 (six) hours as needed for moderate pain or headache.   Yes [provider]  albuterol (PROVENTIL) (2.5 MG/3ML) 0.083% nebulizer solution Take 2.5 mg by nebulization every 6 (six) hours as needed for wheezing.  07/17/19  Yes [provider]  amLODipine (NORVASC) 10 MG tablet Take 1 tablet (10 mg total) by mouth daily. 10/19/19  Yes Mikhail, [place], DO  cloNIDine (CATAPRES) 0.2 MG tablet Take 1 tablet (0.2 mg total) by mouth 2 (two) times daily. 10/18/19  Yes Mikhail, [place], DO  folic acid (FOLVITE) 1 MG tablet Take 1 tablet (1 mg total)  by mouth daily. 12/25/17  Yes Rhetta Mura, MD  furosemide (LASIX) 40 MG tablet Take 40 mg by mouth 2 (two) times daily. 08/08/19  Yes [provider]  metoprolol tartrate (LOPRESSOR) 25 MG tablet Take 1 tablet (25 mg total) by mouth 2 (two) times daily. 10/18/19  Yes Mikhail, [place], DO  Multiple Vitamin (MULTIVITAMIN WITH MINERALS) TABS tablet Take 1 tablet by mouth daily. 10/19/19  Yes Mikhail, [place], DO  oxyCODONE (OXY IR/ROXICODONE) 5 MG immediate release tablet Take 2 tablets (10 mg total) by mouth every 6  (six) hours as needed for moderate pain or severe pain. Patient taking differently: Take 10 mg by mouth every 6 (six) hours as needed for severe pain.  10/17/19  Yes [name], MD  polyethylene glycol (MIRALAX / GLYCOLAX) 17 g packet Take 17 g by mouth daily. Patient taking differently: Take 17 g by mouth daily as needed for mild constipation.  10/17/19  Yes [name], MD  senna-docusate (SENOKOT-S) 8.6-50 MG tablet Take 2 tablets by mouth at bedtime as needed for mild constipation or moderate constipation. 10/17/19  Yes [name], MD  vitamin B-12 1000 MCG tablet Take 1 tablet (1,000 mcg total) by mouth daily. 12/25/17  Yes Rhetta Mura, MD    Physical Exam: Vitals:   09/23/20 2245 09/23/20 2300 09/23/20 2315 09/23/20 2330  BP: (!) 191/105 (!) 191/92 (!) 170/86 (!) 192/115  Pulse: (!) 59     Resp: (!) 27 (!) 22 13 19   Temp:      TempSrc:      SpO2: 99%     Weight:      Height:       Physical Exam Constitutional:      General: He is not in acute distress.    Appearance: Normal appearance.  HENT:     Head: Normocephalic and atraumatic.     Mouth/Throat:     Mouth: Mucous membranes are moist.     Pharynx: Oropharynx is clear.  Eyes:     Extraocular Movements: Extraocular movements intact.     Pupils: Pupils are equal, round, and reactive to light.  Cardiovascular:     Rate and Rhythm: Normal rate and regular rhythm.     Pulses: Normal pulses.     Heart sounds: Normal heart sounds.  Pulmonary:     Effort: Pulmonary effort is normal. No respiratory distress.     Breath sounds: Normal breath sounds.  Abdominal:     General: Bowel sounds are normal. There is no distension.     Palpations: Abdomen is soft.     Tenderness: There is no abdominal tenderness.  Musculoskeletal:        General: No swelling or deformity.  Skin:    General: Skin is warm and dry.  Neurological:     Mental Status: Mental status is at baseline.     Comments: Mental Status: Patient is  awake, alert, oriented x3 Stuttering with intermittent word finding difficulty Cranial Nerves: II: Pupils equal, round, and reactive to light.  III,IV, VI: EOMI intact, difficulty maintain lateral gaze, may be effort related V: Facial sensation decreased on right. VII: Facial movement is symmetric.  VIII: hearing is intact to voice X: Uvula elevates symmetrically XI: Shoulder shrug is symmetric. XII: tongue is midline without atrophy or fasciculations.  Motor: Subpar effort thorughout, at Least 5/5 bilateral UE, 5/5 LLE, 2-3/5 RLE. Sensory: Sensation is grossly intact bilateral UEs & LLE. Decrease ar RLE Cerebellar: Finger-Nose and Heel-Shin intact on left but poor  on right     Labs on Admission: I have personally reviewed following labs and imaging studies  CBC: Recent Labs  Lab 09/23/20 1407  WBC 6.7  NEUTROABS 3.9  HGB 17.9*  HCT 49.6  MCV 86.6  PLT 315    Basic Metabolic Panel: Recent Labs  Lab 09/23/20 1407  NA 138  K 4.2  CL 100  CO2 25  GLUCOSE 101*  BUN 11  CREATININE 1.54*  CALCIUM 9.8    GFR: Estimated Creatinine Clearance: 63.4 mL/min (A) (by C-G formula based on SCr of 1.54 mg/dL (H)).  Liver Function Tests: Recent Labs  Lab 09/23/20 1407  AST 21  ALT 16  ALKPHOS 84  BILITOT 0.8  PROT 7.0  ALBUMIN 4.3    Urine analysis:    Component Value Date/Time   COLORURINE YELLOW 10/08/2019 0456   APPEARANCEUR CLEAR 10/08/2019 0456   LABSPEC 1.013 10/08/2019 0456   PHURINE 6.0 10/08/2019 0456   GLUCOSEU NEGATIVE 10/08/2019 0456   HGBUR NEGATIVE 10/08/2019 0456   BILIRUBINUR NEGATIVE 10/08/2019 0456   KETONESUR NEGATIVE 10/08/2019 0456   PROTEINUR NEGATIVE 10/08/2019 0456   NITRITE NEGATIVE 10/08/2019 0456   LEUKOCYTESUR NEGATIVE 10/08/2019 0456    Radiological Exams on Admission: CT HEAD WO CONTRAST  Result Date: 09/23/2020 CLINICAL DATA:  Neuro deficit, acute stroke suspected. EXAM: CT HEAD WITHOUT CONTRAST TECHNIQUE: Contiguous axial  images were obtained from the base of the skull through the vertex without intravenous contrast. COMPARISON:  Noncontrast head CT 09/20/2020. CT angiogram head/neck 09/20/2020. Noncontrast head CT 09/21/2020. Brain MRI 09/22/2020. FINDINGS: Brain: Cerebral volume is normal. A moderate to large acute/early subacute cortical and subcortical infarct within the left parietal lobe, posterosuperior left temporal lobe and posterior left insula has not significantly changed in extent as compared to the brain MRI performed one day prior. Subtle petechial hemorrhage is questioned, but there is no frank hemorrhagic conversion. Regional mass effect. No midline shift A small subacute infarct within the right corona radiata was better appreciated on the prior MRI. Redemonstrated chronic lacunar infarcts within the bilateral cerebral white matter, left basal ganglia and right thalamus. No extra-axial fluid collection. No evidence of intracranial mass. No midline shift. Vascular: Atherosclerotic calcifications. Skull: Normal. Negative for fracture or focal lesion. Sinuses/Orbits: Visualized orbits show no acute finding. Mild paranasal sinus mucosal thickening, most notably ethmoidal. Other: There are acute, comminuted and displaced bilateral nasal bone fractures, new as compared to prior examinations. Partial opacification of the right nasal passage. Suspected additional minimally displaced acute fractures of the bony nasal septum (for instance as seen on series 4, images 16 and 17). IMPRESSION: A moderate to large acute/early subacute cortical and subcortical infarct within the left MCA vascular territory has not significantly changed in extent as compared to the brain MRI performed one day prior. There is regional mass effect but no midline shift. Subtle petechial hemorrhage is questioned, but there is no frank hemorrhagic conversion. A small subacute infarct within the right corona radiata was better appreciated on the prior  MRI. Redemonstrated chronic lacunar infarcts within the bilateral cerebral white matter, left basal ganglia and right thalamus. Acute, comminuted and displaced bilateral nasal bone fractures which are new as compared to the prior examinations. Suspected minimally displaced acute fractures of the bony nasal septum. Partial opacification of the right nasal passage. Consider dedicated maxillofacial CT for further evaluation. Electronically Signed   By: Jackey Loge DO   On: 09/23/2020 19:23   DG Chest Portable 1 View  Result Date:  09/23/2020 CLINICAL DATA:  Chest pain EXAM: PORTABLE CHEST 1 VIEW COMPARISON:  September 21, 2020 FINDINGS: The heart size and mediastinal contours are within normal limits. Both lungs are clear. The visualized skeletal structures are unremarkable. IMPRESSION: No active disease. Electronically Signed   By: Jonna ClarkBindu  Avutu M.D.   On: 09/23/2020 16:49   CT Angio Chest/Abd/Pel for Dissection W and/or Wo Contrast  Result Date: 09/23/2020 CLINICAL DATA:  Abdominal pain, question aortic dissection EXAM: CT ANGIOGRAPHY CHEST, ABDOMEN AND PELVIS TECHNIQUE: Non-contrast CT of the chest was initially obtained. Multidetector CT imaging through the chest, abdomen and pelvis was performed using the standard protocol during bolus administration of intravenous contrast. Multiplanar reconstructed images and MIPs were obtained and reviewed to evaluate the vascular anatomy. CONTRAST:  100mL OMNIPAQUE IOHEXOL 350 MG/ML SOLN COMPARISON:  None. FINDINGS: CTA CHEST FINDINGS Cardiovascular: --Heart: The heart size is normal.  There is nopericardial effusion. --Aorta: The course and caliber of the thoracic aorta are normal. There is no aortic atherosclerotic calcification. Precontrast images show no aortic intramural hematoma. There is no blood pool, dissection or penetrating ulcer demonstrated on arterial phase postcontrast imaging. There is a origin of the left carotid artery the brachiocephalic trunk. Minimal  calcifications seen at the origin of brachiocephalic trunk. The proximal arch vessels are widely patent. --Pulmonary Arteries: Contrast timing is optimized for preferential opacification of the aorta. Within that limitation, normal central pulmonary arteries. Mediastinum/Nodes: No mediastinal, hilar or axillary lymphadenopathy. The visualized thyroid and thoracic esophageal course are unremarkable. Lungs/Pleura: Mild ground-glass opacity seen at the posterior right lung base. There is a calcified granuloma seen in the right middle lobe. No focal airspace consolidation. No focal pleural abnormality. Musculoskeletal: No chest wall abnormality. No acute osseous findings. Review of the MIP images confirms the above findings. CTA ABDOMEN AND PELVIS FINDINGS VASCULAR Aorta: Normal caliber aorta without aneurysm, dissection, vasculitis or hemodynamically significant stenosis. There is scattered mild aortic atherosclerosis at the aorta bi-iliac bifurcation. Celiac: No aneurysm, dissection or hemodynamically significant stenosis. Normal branching pattern SMA: Widely patent without dissection or stenosis. Renals: Single renal arteries bilaterally. No aneurysm, dissection, stenosis or evidence of fibromuscular dysplasia. IMA: Patent without abnormality. Inflow: Scattered atherosclerosis at the common iliac vasculature. Veins: Normal course and caliber of the major veins. Assessment is otherwise limited by the arterial dominant contrast phase. Review of the MIP images confirms the above findings. NON-VASCULAR Hepatobiliary: Normal hepatic contours and density. No visible biliary dilatation. The patient is status post cholecystectomy. No biliary ductal dilation. Pancreas: Normal contours without ductal dilatation. No peripancreatic fluid collection. 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 Adrenals/Urinary Tract: --Adrenal glands: Normal. --Right kidney/ureter: No hydronephrosis or perinephric stranding. No  nephrolithiasis. No obstructing ureteral stones. --Left kidney/ureter: No hydronephrosis or perinephric stranding. No nephrolithiasis. No obstructing ureteral stones. --Urinary bladder: Unremarkable. Stomach/Bowel: --Stomach/Duodenum: No hiatal hernia or other gastric abnormality. Normal duodenal course and caliber. --Small bowel: No dilatation or inflammation. --Colon: No focal abnormality. --Appendix: Normal. Lymphatic:  No abdominal or pelvic lymphadenopathy. Reproductive: No free fluid in the pelvis. Musculoskeletal. No bony spinal canal stenosis or focal osseous abnormality. Other: None. Review of the MIP images confirms the above findings. IMPRESSION: No acute aortic abnormality. Mild aortic atherosclerosis.  Aortic Atherosclerosis (ICD10-I70.0). No other acute intrathoracic or pelvic pathology. Electronically Signed   By: Jonna ClarkBindu  Avutu M.D.   On: 09/23/2020 19:14    EKG: Independently reviewed. sinus rhythm with LVH and QTC prolongation at 45.  Assessment/Plan Principal Problem:   CVA (cerebral vascular accident) (  HCC) Active Problems:   Hypertension   CKD (chronic kidney disease)   (HFpEF) heart failure with preserved ejection fraction (HCC)  CVA > CT here confirmed stroke radiology was able to compare with MRI from outside hospital - Neurology consulted, follow-up recommendations for this partially previously worked up patient - Out of permissive hypertensive window, will work to normalize blood pressure  - ASA 81 mg daily  - We will start statin  - May need repeat echocardiogram if unable to obtain records  -May need to repeat carotid evaluation if unable to obtain records  - We will check A1C, Lipid panel  - Tele monitoring  - SLP eval - PT/OT  HTN - Resume home clonidine, amlodipine, metoprolol, Lasix  HFpEF - Resume home metoprolol, Lasix  CKD - Avoid nephrotoxic agents - Follow renal function  Head Ache Chronic Pain  > Received headache cocktail on IV opiates in ED  with minimal improvement - We will provide home opioid medication - Continue to monitor  Polysubstance use > Patient has a history of marijuana and recent trial of ecstasy.  He denies cocaine but has had positive cocaine on his UDS is at other hospitalizations. - Continue to monitor  DVT prophylaxis: Lovenox  Code Status:   Full Family Communication:  Significant other updated at bedside   Disposition Plan:   Patient is from:  [facility], home prior to this  Anticipated DC to:  Pending work-up  Anticipated DC date:  Pending clinical course  Anticipated DC barriers: History of leaving AMA   Consults called:  Neurology, Otelia Limes, consulted by EDP Admission status:  Inpatient, telemetry   Severity of Illness: The appropriate patient status for this patient is INPATIENT. Inpatient status is judged to be reasonable and necessary in order to provide the required intensity of service to ensure the patient's safety. The patient's presenting symptoms, physical exam findings, and initial radiographic and laboratory data in the context of their chronic comorbidities is felt to place them at high risk for further clinical deterioration. Furthermore, it is not anticipated that the patient will be medically stable for discharge from the hospital within 2 midnights of admission. The following factors support the patient status of inpatient.   " The patient's presenting symptoms include right lower extremity weakness, headache. " The worrisome physical exam findings include right lower extremity weakness. " The initial radiographic and laboratory data are worrisome because of outside hospital records showing stroke with confirmation on CT. " The chronic co-morbidities include heart failure, CKD, hypertension.   * I certify that at the point of admission it is my clinical judgment that the patient will require inpatient hospital care spanning beyond 2 midnights from the point of admission due to  high intensity of service, high risk for further deterioration and high frequency of surveillance required.Synetta Fail MD [facility]  How to contact the [facility] Attending or Consulting provider 7A - 7P or covering provider during after hours 7P -7A, for this patient?   1. Check the care team in [facility] and look for a) attending/consulting TR[facility] provider listed and b) the [facility] team listed 2. Log into [email or web address] and use [facility]'s universal password to access. If you do not have the password, please contact the hospital operator. 3. Locate the [facility] provider you are looking for under [facility] and page to a number that you can be directly reached. 4. If you still have difficulty reaching the provider, please page the [facility] (Director on  Call) for the Hospitalists listed on amion for assistance.  09/24/2020, 12:56 AM

## 2020-09-23 NOTE — ED Notes (Signed)
Patient turned off his bedside monitor

## 2020-09-23 NOTE — Consult Note (Signed)
Referring Physician: Dr. Fredderick Phenix    Chief Complaint: Subacute left parietofrontal stroke on imaging.   HPI: Alejandro Perez is an 44 y.o. male with a PMHx of HTN who initially presented to [facility] with right sided weakness, where he was diagnosed with a stroke based on clinical features and imaging. He left AMA due to his perception that they were not taking care of him. On presentation to the [facility] ED on Thursday afternoon, he stated he was weak, with SOB, slurred speech, chest pain, nausea, throat pain and head pain.   Exam by EDP revealed ataxia, right sided weakness and aphasia. The patient clarified that these symptoms started 5 days ago.   The patient states that he has no history of stroke, PAD, DM or elevated cholesterol. He does endorse prior procedure to his right eye for glaucoma as well as vision loss.   Of note, he fell out of his wheelchair and the wheelchair then landed on his face while he was at [facility]. CT shows bilateral nasal bone fractures in addition to the left MCA infarct.   CT head [facility] ED): A moderate to large acute/early subacute cortical and subcortical infarct within the left MCA vascular territory has not significantly changed in extent as compared to the brain MRI performed one day prior. There is regional mass effect but no midline shift. Subtle petechial hemorrhage is questioned, but there is no frank hemorrhagic conversion. A small subacute infarct within the right corona radiata was better appreciated on the prior MRI. Redemonstrated chronic lacunar infarcts within the bilateral cerebral white matter, left basal ganglia and right thalamus. Acute, comminuted and displaced bilateral nasal bone fractures which are new as compared to the prior examinations. Suspected minimally displaced acute fractures of the bony nasal septum. Partial opacification of the right nasal passage.   LSN: 5 days ago tPA Given: No: Out of tPA time window  Past Medical History:  Diagnosis Date  . Anemia    . Chest pain   . Glaucoma   . Hypertension   . LVH (left ventricular hypertrophy)   . Spleen enlarged   . Stroke (HCC)   . Thrombocytopenia (HCC)     Past Surgical History:  Procedure Laterality Date  . CHOLECYSTECTOMY    . 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 Sickle cell trait Mother    Social History:  reports that he has quit smoking. He has never used smokeless tobacco. He reports that he does not drink alcohol and does not use drugs.  Allergies:  Allergies  Allergen Reactions  . Morphine And Related Other (See Comments)    Severe abdominal pain    Medications:   No current facility-administered medications on file prior to encounter.   Current Outpatient Medications on File Prior to Encounter  Medication Sig Dispense Refill  . acetaminophen (TYLENOL) 325 MG tablet Take 650 mg by mouth every 6 (six) hours as needed for moderate pain or headache.    . albuterol (PROVENTIL) (2.5 MG/3ML) 0.083% nebulizer solution Take 2.5 mg by nebulization every 6 (six) hours as needed for wheezing.     Marland Kitchen amLODipine (NORVASC) 10 MG tablet Take 1 tablet (10 mg total) by mouth daily. 30 tablet 0  . cloNIDine (CATAPRES) 0.2 MG tablet Take 1 tablet (0.2 mg total) by mouth 2 (two) times daily. 60 tablet 0  .  folic acid (FOLVITE) 1 MG tablet Take 1 tablet (1 mg total) by mouth daily. 30 tablet 0  . furosemide (LASIX) 40 MG tablet Take 40 mg by mouth 2 (two) times daily.    . metoprolol tartrate (LOPRESSOR) 25 MG tablet Take 1 tablet (25 mg total) by mouth 2 (two) times daily. 60 tablet 0  . Multiple Vitamin (MULTIVITAMIN WITH MINERALS) TABS tablet Take 1 tablet by mouth daily.    Marland Kitchen. oxyCODONE (OXY IR/ROXICODONE) 5 MG immediate release tablet Take 2 tablets (10 mg total) by mouth every 6 (six) hours as needed for moderate pain or  severe pain. (Patient taking differently: Take 10 mg by mouth every 6 (six) hours as needed for severe pain. ) 56 tablet 0  . polyethylene glycol (MIRALAX / GLYCOLAX) 17 g packet Take 17 g by mouth daily. (Patient taking differently: Take 17 g by mouth daily as needed for mild constipation. ) 30 each 0  . senna-docusate (SENOKOT-S) 8.6-50 MG tablet Take 2 tablets by mouth at bedtime as needed for mild constipation or moderate constipation. 30 tablet 0  . vitamin B-12 1000 MCG tablet Take 1 tablet (1,000 mcg total) by mouth daily. 30 tablet 0    Scheduled: . amLODipine  10 mg Oral Daily  . aspirin EC  81 mg Oral Daily  . cloNIDine  0.2 mg Oral BID  . enoxaparin (LOVENOX) injection  40 mg Subcutaneous Q24H  . folic acid  1 mg Oral Daily  . furosemide  40 mg Oral BID  . metoprolol tartrate  25 mg Oral BID  . sodium chloride flush  3 mL Intravenous Once  . sodium chloride flush  3 mL Intravenous Q12H  . cyanocobalamin  1,000 mcg Oral Daily   Continuous: . lactated ringers 100 mL/hr at 09/23/20 2214    ROS: As per HPI.   Physical Examination: Blood pressure (!) 189/105, pulse 62, temperature 98.4 F (36.9 C), temperature source Oral, resp. rate (!) 23, height 6\' 2"  (1.88 m), weight 74.8 kg, SpO2 100 %.  HEENT: [place]/AT Lungs: Respirations unlabored Ext: No edema  Neurologic Examination: Mental Status: Awake and alert. Has a bewildered, wide eyed expression of his upper face which patient states is because he is having trouble seeing. Significant word finding difficulty with halting speech and at times with phonemic paraphasias. Unable to repeat a phrase. Had difficulty with a 2-step directional command. States he is frustrated by his difficulty speaking. No dysarthria.  Cranial Nerves: II:  Visual field constriction in peripheral fields of all 4 quadrants of each eye with a trend towards worse deficit in the right hemifields. PERRL.  III,IV, VI: No ptosis. On testing of EOM, he has  difficulty tracking and initiating saccades to left and right. Also with difficulty on upgaze. No nystagmus.   V,VII: Smile symmetric, facial temp sensation decreased on the right VIII: hearing intact to voice IX,X: No hypophonia XI: No gross asymmetry XII: midline tongue extension  Motor: RUE with sensory ataxia and 4/5 strength proximally and distally RLE 4+/5 LUE and LLE 5/5 Sensory: Decreased temp sensation to RUE and RLE.  Deep Tendon Reflexes:  2+ bilateral brachioradialis, patellae and achilles Plantars: Right: downgoing   Left: downgoing Cerebellar: Severe sensory ataxia bilaterally with FNF, worse on the right. Also with truncal ataxia. Seems to have difficulty orienting his trunk/neck/head in space.  Gait: Deferred  Results for orders placed or performed during the hospital encounter of 09/23/20 (from the past 48 hour(s))  Protime-INR  Status: None   Collection Time: 09/23/20  2:07 PM  Result Value Ref Range   Prothrombin Time 12.6 11.4 - 15.2 seconds   INR 1.0 0.8 - 1.2    Comment: (NOTE) INR goal varies based on device and disease states. Performed at [facility], [address], [place], [place] [address]   APTT     Status: None   Collection Time: 09/23/20  2:07 PM  Result Value Ref Range   aPTT 32 24 - 36 seconds    Comment: Performed at [facility], [address] [place], [place] [address]  CBC     Status: Abnormal   Collection Time: 09/23/20  2:07 PM  Result Value Ref Range   WBC 6.7 4.0 - 10.5 K/uL   RBC 5.73 4.22 - 5.81 MIL/uL   Hemoglobin 17.9 (H) 13.0 - 17.0 g/dL   HCT 09.8 39 - 52 %   MCV 86.6 80.0 - 100.0 fL   MCH 31.2 26.0 - 34.0 pg   MCHC 36.1 (H) 30.0 - 36.0 g/dL   RDW 11.9 14.7 - 82.9 %   Platelets 315 150 - 400 K/uL    Comment: REPEATED TO VERIFY   nRBC 0.0 0.0 - 0.2 %    Comment: Performed at [facility], [address], [place], [place] [address]  Differential     Status: None   Collection Time: 09/23/20  2:07 PM   Result Value Ref Range   Neutrophils Relative % 58 %   Neutro Abs 3.9 1.7 - 7.7 K/uL   Lymphocytes Relative 20 %   Lymphs Abs 1.3 0.7 - 4.0 K/uL   Monocytes Relative 13 %   Monocytes Absolute 0.9 0.1 - 1.0 K/uL   Eosinophils Relative 8 %   Eosinophils Absolute 0.5 0.0 - 0.5 K/uL   Basophils Relative 1 %   Basophils Absolute 0.1 0.0 - 0.1 K/uL   Immature Granulocytes 0 %   Abs Immature Granulocytes 0.02 0.00 - 0.07 K/uL    Comment: Performed at [facility], [address] [place], [place] [address]  Comprehensive metabolic panel     Status: Abnormal   Collection Time: 09/23/20  2:07 PM  Result Value Ref Range   Sodium 138 135 - 145 mmol/L   Potassium 4.2 3.5 - 5.1 mmol/L   Chloride 100 98 - 111 mmol/L   CO2 25 22 - 32 mmol/L   Glucose, Bld 101 (H) 70 - 99 mg/dL    Comment: Glucose reference range applies only to samples taken after fasting for at least 8 hours.   BUN 11 6 - 20 mg/dL   Creatinine, Ser 8.46 (H) 0.61 - 1.24 mg/dL   Calcium 9.8 8.9 - 96.2 mg/dL   Total Protein 7.0 6.5 - 8.1 g/dL   Albumin 4.3 3.5 - 5.0 g/dL   AST 21 15 - 41 U/L   ALT 16 0 - 44 U/L   Alkaline Phosphatase 84 38 - 126 U/L   Total Bilirubin 0.8 0.3 - 1.2 mg/dL   GFR, Estimated 56 (L) >60 mL/min    Comment: (NOTE) Calculated using the CKD-EPI Creatinine Equation (2021)    Anion gap 13 5 - 15    Comment: Performed at [facility], [address], [place], [place] [address]  Troponin I (High Sensitivity)     Status: Abnormal   Collection Time: 09/23/20  2:07 PM  Result Value Ref Range   Troponin I (High Sensitivity)  18 (H) <18 ng/L    Comment: (NOTE) Elevated high sensitivity troponin I (hsTnI) values and significant  changes across serial measurements may suggest ACS but many other  chronic and acute conditions are known to elevate hsTnI results.  Refer to the "Links" section for chest pain algorithms and additional  guidance. Performed at [facility], [address]  [place], [place] [address]    CT HEAD WO CONTRAST  Result Date: 09/23/2020 CLINICAL DATA:  Neuro deficit, acute stroke suspected. EXAM: CT HEAD WITHOUT CONTRAST TECHNIQUE: Contiguous axial images were obtained from the base of the skull through the vertex without intravenous contrast. COMPARISON:  Noncontrast head CT 09/20/2020. CT angiogram head/neck 09/20/2020. Noncontrast head CT 09/21/2020. Brain MRI 09/22/2020. FINDINGS: Brain: Cerebral volume is normal. A moderate to large acute/early subacute cortical and subcortical infarct within the left parietal lobe, posterosuperior left temporal lobe and posterior left insula has not significantly changed in extent as compared to the brain MRI performed one day prior. Subtle petechial hemorrhage is questioned, but there is no frank hemorrhagic conversion. Regional mass effect. No midline shift A small subacute infarct within the right corona radiata was better appreciated on the prior MRI. Redemonstrated chronic lacunar infarcts within the bilateral cerebral white matter, left basal ganglia and right thalamus. No extra-axial fluid collection. No evidence of intracranial mass. No midline shift. Vascular: Atherosclerotic calcifications. Skull: Normal. Negative for fracture or focal lesion. Sinuses/Orbits: Visualized orbits show no acute finding. Mild paranasal sinus mucosal thickening, most notably ethmoidal. Other: There are acute, comminuted and displaced bilateral nasal bone fractures, new as compared to prior examinations. Partial opacification of the right nasal passage. Suspected additional minimally displaced acute fractures of the bony nasal septum (for instance as seen on series 4, images 16 and 17). IMPRESSION: A moderate to large acute/early subacute cortical and subcortical infarct within the left MCA vascular territory has not significantly changed in extent as compared to the brain MRI performed one day prior. There is regional mass effect but no midline  shift. Subtle petechial hemorrhage is questioned, but there is no frank hemorrhagic conversion. A small subacute infarct within the right corona radiata was better appreciated on the prior MRI. Redemonstrated chronic lacunar infarcts within the bilateral cerebral white matter, left basal ganglia and right thalamus. Acute, comminuted and displaced bilateral nasal bone fractures which are new as compared to the prior examinations. Suspected minimally displaced acute fractures of the bony nasal septum. Partial opacification of the right nasal passage. Consider dedicated maxillofacial CT for further evaluation. Electronically Signed   By: Jackey Loge DO   On: 09/23/2020 19:23   DG Chest Portable 1 View  Result Date: 09/23/2020 CLINICAL DATA:  Chest pain EXAM: PORTABLE CHEST 1 VIEW COMPARISON:  September 21, 2020 FINDINGS: The heart size and mediastinal contours are within normal limits. Both lungs are clear. The visualized skeletal structures are unremarkable. IMPRESSION: No active disease. Electronically Signed   By: Jonna Clark M.D.   On: 09/23/2020 16:49   CT Angio Chest/Abd/Pel for Dissection W and/or Wo Contrast  Result Date: 09/23/2020 CLINICAL DATA:  Abdominal pain, question aortic dissection EXAM: CT ANGIOGRAPHY CHEST, ABDOMEN AND PELVIS TECHNIQUE: Non-contrast CT of the chest was initially obtained. Multidetector CT imaging through the chest, abdomen and pelvis was performed using the standard protocol during bolus administration of intravenous contrast. Multiplanar reconstructed images and MIPs were obtained and reviewed to evaluate the vascular anatomy. CONTRAST:  OMNIPAQUE IOHEXOL 350 MG/ML SOLN COMPARISON:  None. FINDINGS: CTA  CHEST FINDINGS Cardiovascular: --Heart: The heart size is normal.  There is nopericardial effusion. --Aorta: The course and caliber of the thoracic aorta are normal. There is no aortic atherosclerotic calcification. Precontrast images show no aortic intramural hematoma.  There is no blood pool, dissection or penetrating ulcer demonstrated on arterial phase postcontrast imaging. There is a origin of the left carotid artery the brachiocephalic trunk. Minimal calcifications seen at the origin of brachiocephalic trunk. The proximal arch vessels are widely patent. --Pulmonary Arteries: Contrast timing is optimized for preferential opacification of the aorta. Within that limitation, normal central pulmonary arteries. Mediastinum/Nodes: No mediastinal, hilar or axillary lymphadenopathy. The visualized thyroid and thoracic esophageal course are unremarkable. Lungs/Pleura: Mild ground-glass opacity seen at the posterior right lung base. There is a calcified granuloma seen in the right middle lobe. No focal airspace consolidation. No focal pleural abnormality. Musculoskeletal: No chest wall abnormality. No acute osseous findings. Review of the MIP images confirms the above findings. CTA ABDOMEN AND PELVIS FINDINGS VASCULAR Aorta: Normal caliber aorta without aneurysm, dissection, vasculitis or hemodynamically significant stenosis. There is scattered mild aortic atherosclerosis at the aorta bi-iliac bifurcation. Celiac: No aneurysm, dissection or hemodynamically significant stenosis. Normal branching pattern SMA: Widely patent without dissection or stenosis. Renals: Single renal arteries bilaterally. No aneurysm, dissection, stenosis or evidence of fibromuscular dysplasia. IMA: Patent without abnormality. Inflow: Scattered atherosclerosis at the common iliac vasculature. Veins: Normal course and caliber of the major veins. Assessment is otherwise limited by the arterial dominant contrast phase. Review of the MIP images confirms the above findings. NON-VASCULAR Hepatobiliary: Normal hepatic contours and density. No visible biliary dilatation. The patient is status post cholecystectomy. No biliary ductal dilation. Pancreas: Normal contours without ductal dilatation. No peripancreatic fluid  collection. 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 Adrenals/Urinary Tract: --Adrenal glands: Normal. --Right kidney/ureter: No hydronephrosis or perinephric stranding. No nephrolithiasis. No obstructing ureteral stones. --Left kidney/ureter: No hydronephrosis or perinephric stranding. No nephrolithiasis. No obstructing ureteral stones. --Urinary bladder: Unremarkable. Stomach/Bowel: --Stomach/Duodenum: No hiatal hernia or other gastric abnormality. Normal duodenal course and caliber. --Small bowel: No dilatation or inflammation. --Colon: No focal abnormality. --Appendix: Normal. Lymphatic:  No abdominal or pelvic lymphadenopathy. Reproductive: No free fluid in the pelvis. Musculoskeletal. No bony spinal canal stenosis or focal osseous abnormality. Other: None. Review of the MIP images confirms the above findings. IMPRESSION: No acute aortic abnormality. Mild aortic atherosclerosis.  Aortic Atherosclerosis (ICD10-I70.0). No other acute intrathoracic or pelvic pathology. Electronically Signed   By: Jonna Clark M.D.   On: 09/23/2020 19:14    Assessment: 44 y.o. male wit subacute left MCA stroke, medium to large in size. 1. Exam reveals multiple findings referable to his left parietal, posterior temporal and posterior frontal lobe ischemic infarction. 2.  CT head reveals a moderate to large acute/early subacute cortical and subcortical infarct within the left MCA vascular territory has not significantly changed in extent as compared to the brain MRI performed one day prior. There is regional mass effect but no midline shift. Subtle petechial hemorrhage is questioned, but there is no frank hemorrhagic conversion. A small subacute infarct within the right corona radiata was better appreciated on the prior MRI. Redemonstrated chronic lacunar infarcts within the bilateral cerebral white matter, left basal ganglia and right thalamus.  3. Stroke Risk Factors - HTN 4. Family history of sickle cell trait. With  stroke at his young age, he may have undiagnosed sickle cell trait.   Recommendations: 1. HgbA1c, fasting lipid panel 2. MRI, MRA  of the  brain without contrast 3. PT consult, OT consult, Speech consult 4. Echocardiogram 5. Carotid dopplers 6. Prophylactic therapy- Continue ASA 7. Risk factor modification 8. Telemetry monitoring 9. Frequent neuro checks 10. BP management.  11. Genetic test for sickle cell trait.  12. Stroke in the young work up (hypercoagulable panel)    signed: Dr. Caryl Pina  09/23/2020, 8:13 PM

## 2020-09-23 NOTE — ED Notes (Signed)
Got patient undress on the monitor patient is resting with call bell in reach 

## 2020-09-23 NOTE — ED Provider Notes (Signed)
[facility] EMERGENCY DEPARTMENT Provider Note   CSN: [id number] Arrival date & time: 09/23/20  1349     History Chief Complaint  Patient presents with   Weakness    Alejandro Perez is a 44 y.o. male.  The history is provided by the patient.  Neurologic Problem This is a new problem. Episode onset: 4 days ago. The problem has been gradually worsening. Associated symptoms include chest pain, abdominal pain and headaches. Pertinent negatives include no shortness of breath. Nothing aggravates the symptoms. Nothing relieves the symptoms. He has tried nothing for the symptoms.    HPI: A 44 year old patient with a history of hypertension presents for evaluation of chest pain. Initial onset of pain was more than 6 hours ago. The patient's chest pain is not worse with exertion. The patient complains of nausea. The patient's chest pain is middle- or left-sided, is not well-localized, is not described as heaviness/pressure/tightness, is not sharp and does not radiate to the arms/jaw/neck. The patient denies diaphoresis. The patient has no history of stroke, has no history of peripheral artery disease, has not smoked in the past 90 days, denies any history of treated diabetes, has no relevant family history of coronary artery disease (first degree relative at less than age 49), has no history of hypercholesterolemia and does not have an elevated BMI (>=30).   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   Nausea 10/11/2019   Splenomegaly 10/08/2019   Vitamin B12 deficiency    Pulmonary HTN (HCC)    Chest pain 12/21/2017    Thrombocytopenia (HCC) 12/21/2017   Spleen enlarged 12/21/2017   Schistocytes on peripheral blood smear 12/21/2017   Hypertension    Anemia    Acquired TTP    Abdominal pain, RUQ     Past Surgical History:  Procedure Laterality Date   CHOLECYSTECTOMY     SPLENECTOMY, 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   Procedure: SPLENECTOMY;  Surgeon: [name], MD;  Location: [facility] OR;  Service: General;  Laterality: N/A;       Family History  Problem Relation Age of Onset   Sickle cell trait Mother     Social History   Tobacco Use   Smoking status: Former Smoker   Smokeless tobacco: Never Used  Building services engineer Use: Never used  Substance Use Topics   Alcohol use: No   Drug use: No    Home Medications Prior to Admission medications   Medication Sig Start Date End Date Taking? Authorizing Provider  acetaminophen (TYLENOL) 325 MG tablet Take 650 mg by mouth every 6 (six) hours as needed for moderate pain or headache.   Yes [provider]  albuterol (PROVENTIL) (2.5 MG/3ML) 0.083% nebulizer solution Take 2.5 mg by nebulization every 6 (six) hours as needed for wheezing.  07/17/19  Yes [provider]  amLODipine (NORVASC) 10 MG tablet Take 1 tablet (10 mg total) by mouth daily. 10/19/19  Yes Mikhail, [place], DO  cloNIDine (CATAPRES) 0.2 MG tablet Take 1 tablet (0.2 mg total) by mouth 2 (two) times daily. 10/18/19  Yes Mikhail,  Maryann, DO  folic acid (FOLVITE) 1 MG tablet Take 1 tablet (1 mg total) by mouth daily. 12/25/17  Yes Rhetta Mura, MD  furosemide (LASIX) 40 MG tablet Take 40 mg by mouth 2 (two) times daily. 08/08/19  Yes [provider]  metoprolol tartrate (LOPRESSOR) 25 MG tablet Take 1 tablet (25 mg total) by mouth 2 (two) times daily. 10/18/19  Yes Mikhail, [place], DO  Multiple Vitamin (MULTIVITAMIN WITH MINERALS) TABS tablet Take 1 tablet by mouth daily. 10/19/19  Yes Mikhail, [place], DO  oxyCODONE (OXY IR/ROXICODONE) 5 MG  immediate release tablet Take 2 tablets (10 mg total) by mouth every 6 (six) hours as needed for moderate pain or severe pain. Patient taking differently: Take 10 mg by mouth every 6 (six) hours as needed for severe pain.  10/17/19  Yes [name], MD  polyethylene glycol (MIRALAX / GLYCOLAX) 17 g packet Take 17 g by mouth daily. Patient taking differently: Take 17 g by mouth daily as needed for mild constipation.  10/17/19  Yes [name], MD  senna-docusate (SENOKOT-S) 8.6-50 MG tablet Take 2 tablets by mouth at bedtime as needed for mild constipation or moderate constipation. 10/17/19  Yes [name], MD  vitamin B-12 1000 MCG tablet Take 1 tablet (1,000 mcg total) by mouth daily. 12/25/17  Yes Rhetta Mura, MD    Allergies    Morphine and related  Review of Systems   Review of Systems  Constitutional: Negative for chills and fever.  HENT: Negative for ear pain and sore throat.   Eyes: Negative for pain and visual disturbance.  Respiratory: Negative for cough and shortness of breath.   Cardiovascular: Positive for chest pain. Negative for palpitations.  Gastrointestinal: Positive for abdominal pain and nausea. Negative for vomiting.  Genitourinary: Negative for dysuria and hematuria.  Musculoskeletal: Negative for arthralgias and back pain.  Skin: Negative for color change and rash.  Neurological: Positive for dizziness (feels off balance, falling to L), speech difficulty, numbness (R sided) and headaches. Negative for seizures and syncope.  All other systems reviewed and are negative.   Physical Exam Updated Vital Signs BP (!) 191/105    Pulse (!) 59    Temp 98.4 F (36.9 C) (Oral)    Resp (!) 27    Ht  (1.88 m)    Wt 74.8 kg    SpO2 99%    BMI 21.18 kg/m   Physical Exam Vitals and nursing note reviewed.  Constitutional:      Appearance: He is well-developed. He is not toxic-appearing or diaphoretic.     Comments: Appears uncomfortable  HENT:     Head:  Normocephalic and atraumatic.     Mouth/Throat:     Mouth: Mucous membranes are dry.     Pharynx: Oropharynx is clear.  Eyes:     Conjunctiva/sclera: Conjunctivae normal.  Cardiovascular:     Rate and Rhythm: Normal rate and regular rhythm.     Heart sounds: No murmur heard.  No gallop.   Pulmonary:     Effort: Pulmonary effort is normal. No respiratory distress.     Breath sounds: Normal breath sounds.  Abdominal:     Palpations: Abdomen is soft.     Tenderness: There is no abdominal tenderness.  Musculoskeletal:     Cervical back: Neck supple.  Skin:    General: Skin is warm and dry.  Neurological:     Mental Status: He is alert.     Cranial Nerves: Dysarthria present.  Sensory: Sensory deficit present.     Gait: Gait abnormal.     Comments: Mental status: alert and oriented to person, place, time, situation. Speech: Speech is clear and language is mildly aphasic Fund of knowledge: Intact  Cranial Nerves:  II: Intact to confrontation bilaterally III, IV, VI: EOMI, no nystagmus V: face sensation decreased on R, good masseter strength VII: no facial droop or weakness VIII: gross hearing intact bilaterally IX/XI: palate elevates symmetrically XII: tongue protrudes symmetrically, no deviation  Strength: 5/5 and symmetric in BUE and BLE. No pronation or drift. Tone: normal tone, no tremors Coordination: Grossly ataxic finger to nose and heel to shin. Sensation: Decreased to light touch in RUE and RLE.  Romberg unable to assess Gait: Patient unable to stand without assistance     ED Results / Procedures / Treatments   Labs (all labs ordered are listed, but only abnormal results are displayed) Labs Reviewed  CBC - Abnormal; Notable for the following components:      Result Value   Hemoglobin 17.9 (*)    MCHC 36.1 (*)    All other components within normal limits  COMPREHENSIVE METABOLIC PANEL - Abnormal; Notable for the following components:   Glucose, Bld 101  (*)    Creatinine, Ser 1.54 (*)    GFR, Estimated 56 (*)    All other components within normal limits  TROPONIN I (HIGH SENSITIVITY) - Abnormal; Notable for the following components:   Troponin I (High Sensitivity) 18 (*)    All other components within normal limits  RESPIRATORY PANEL BY RT PCR (FLU A&B, COVID)  PROTIME-INR  APTT  DIFFERENTIAL  BASIC METABOLIC PANEL  CBC  CBG MONITORING, ED  TROPONIN I (HIGH SENSITIVITY)    EKG EKG Interpretation  Date/Time:  Thursday September 23 2020 13:50:55 EDT Ventricular Rate:  63 PR Interval:  146 QRS Duration: 80 QT Interval:  474 QTC Calculation: 485 R Axis:   88 Text Interpretation: Normal sinus rhythm Biatrial enlargement Left ventricular hypertrophy ( Sokolow-Lyon , Cornell product , Romhilt-Estes ) ST & Marked T-wave abnormality, consider inferolateral ischemia Prolonged QT Abnormal ECG changed from prior Confirmed by Rolan Bucco [phone]) on 09/23/2020 3:15:29 PM   Radiology CT HEAD WO CONTRAST  Result Date: 09/23/2020 CLINICAL DATA:  Neuro deficit, acute stroke suspected. EXAM: CT HEAD WITHOUT CONTRAST TECHNIQUE: Contiguous axial images were obtained from the base of the skull through the vertex without intravenous contrast. COMPARISON:  Noncontrast head CT 09/20/2020. CT angiogram head/neck 09/20/2020. Noncontrast head CT 09/21/2020. Brain MRI 09/22/2020. FINDINGS: Brain: Cerebral volume is normal. A moderate to large acute/early subacute cortical and subcortical infarct within the left parietal lobe, posterosuperior left temporal lobe and posterior left insula has not significantly changed in extent as compared to the brain MRI performed one day prior. Subtle petechial hemorrhage is questioned, but there is no frank hemorrhagic conversion. Regional mass effect. No midline shift A small subacute infarct within the right corona radiata was better appreciated on the prior MRI. Redemonstrated chronic lacunar infarcts within the bilateral  cerebral white matter, left basal ganglia and right thalamus. No extra-axial fluid collection. No evidence of intracranial mass. No midline shift. Vascular: Atherosclerotic calcifications. Skull: Normal. Negative for fracture or focal lesion. Sinuses/Orbits: Visualized orbits show no acute finding. Mild paranasal sinus mucosal thickening, most notably ethmoidal. Other: There are acute, comminuted and displaced bilateral nasal bone fractures, new as compared to prior examinations. Partial opacification of the right nasal passage. Suspected additional minimally displaced acute fractures of the  bony nasal septum (for instance as seen on series 4, images 16 and 17). IMPRESSION: A moderate to large acute/early subacute cortical and subcortical infarct within the left MCA vascular territory has not significantly changed in extent as compared to the brain MRI performed one day prior. There is regional mass effect but no midline shift. Subtle petechial hemorrhage is questioned, but there is no frank hemorrhagic conversion. A small subacute infarct within the right corona radiata was better appreciated on the prior MRI. Redemonstrated chronic lacunar infarcts within the bilateral cerebral white matter, left basal ganglia and right thalamus. Acute, comminuted and displaced bilateral nasal bone fractures which are new as compared to the prior examinations. Suspected minimally displaced acute fractures of the bony nasal septum. Partial opacification of the right nasal passage. Consider dedicated maxillofacial CT for further evaluation. Electronically Signed   By: Jackey LogeKyle  Golden DO   On: 09/23/2020 19:23   DG Chest Portable 1 View  Result Date: 09/23/2020 CLINICAL DATA:  Chest pain EXAM: PORTABLE CHEST 1 VIEW COMPARISON:  September 21, 2020 FINDINGS: The heart size and mediastinal contours are within normal limits. Both lungs are clear. The visualized skeletal structures are unremarkable. IMPRESSION: No active disease.  Electronically Signed   By: Jonna ClarkBindu  Avutu M.D.   On: 09/23/2020 16:49   CT Angio Chest/Abd/Pel for Dissection W and/or Wo Contrast  Result Date: 09/23/2020 CLINICAL DATA:  Abdominal pain, question aortic dissection EXAM: CT ANGIOGRAPHY CHEST, ABDOMEN AND PELVIS TECHNIQUE: Non-contrast CT of the chest was initially obtained. Multidetector CT imaging through the chest, abdomen and pelvis was performed using the standard protocol during bolus administration of intravenous contrast. Multiplanar reconstructed images and MIPs were obtained and reviewed to evaluate the vascular anatomy. CONTRAST:  100mL OMNIPAQUE IOHEXOL 350 MG/ML SOLN COMPARISON:  None. FINDINGS: CTA CHEST FINDINGS Cardiovascular: --Heart: The heart size is normal.  There is nopericardial effusion. --Aorta: The course and caliber of the thoracic aorta are normal. There is no aortic atherosclerotic calcification. Precontrast images show no aortic intramural hematoma. There is no blood pool, dissection or penetrating ulcer demonstrated on arterial phase postcontrast imaging. There is a origin of the left carotid artery the brachiocephalic trunk. Minimal calcifications seen at the origin of brachiocephalic trunk. The proximal arch vessels are widely patent. --Pulmonary Arteries: Contrast timing is optimized for preferential opacification of the aorta. Within that limitation, normal central pulmonary arteries. Mediastinum/Nodes: No mediastinal, hilar or axillary lymphadenopathy. The visualized thyroid and thoracic esophageal course are unremarkable. Lungs/Pleura: Mild ground-glass opacity seen at the posterior right lung base. There is a calcified granuloma seen in the right middle lobe. No focal airspace consolidation. No focal pleural abnormality. Musculoskeletal: No chest wall abnormality. No acute osseous findings. Review of the MIP images confirms the above findings. CTA ABDOMEN AND PELVIS FINDINGS VASCULAR Aorta: Normal caliber aorta without  aneurysm, dissection, vasculitis or hemodynamically significant stenosis. There is scattered mild aortic atherosclerosis at the aorta bi-iliac bifurcation. Celiac: No aneurysm, dissection or hemodynamically significant stenosis. Normal branching pattern SMA: Widely patent without dissection or stenosis. Renals: Single renal arteries bilaterally. No aneurysm, dissection, stenosis or evidence of fibromuscular dysplasia. IMA: Patent without abnormality. Inflow: Scattered atherosclerosis at the common iliac vasculature. Veins: Normal course and caliber of the major veins. Assessment is otherwise limited by the arterial dominant contrast phase. Review of the MIP images confirms the above findings. NON-VASCULAR Hepatobiliary: Normal hepatic contours and density. No visible biliary dilatation. The patient is status post cholecystectomy. No biliary ductal dilation. Pancreas: Normal contours without ductal  dilatation. No peripancreatic fluid collection. 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 Adrenals/Urinary Tract: --Adrenal glands: Normal. --Right kidney/ureter: No hydronephrosis or perinephric stranding. No nephrolithiasis. No obstructing ureteral stones. --Left kidney/ureter: No hydronephrosis or perinephric stranding. No nephrolithiasis. No obstructing ureteral stones. --Urinary bladder: Unremarkable. Stomach/Bowel: --Stomach/Duodenum: No hiatal hernia or other gastric abnormality. Normal duodenal course and caliber. --Small bowel: No dilatation or inflammation. --Colon: No focal abnormality. --Appendix: Normal. Lymphatic:  No abdominal or pelvic lymphadenopathy. Reproductive: No free fluid in the pelvis. Musculoskeletal. No bony spinal canal stenosis or focal osseous abnormality. Other: None. Review of the MIP images confirms the above findings. IMPRESSION: No acute aortic abnormality. Mild aortic atherosclerosis.  Aortic Atherosclerosis (ICD10-I70.0). No other acute intrathoracic or pelvic pathology.  Electronically Signed   By: Jonna Clark M.D.   On: 09/23/2020 19:14    Procedures Procedures (including critical care time)  Medications Ordered in ED Medications  sodium chloride flush (NS) 0.9 % injection 3 mL (3 mLs Intravenous Not Given 09/23/20 1453)  oxyCODONE (Oxy IR/ROXICODONE) immediate release tablet 10 mg (has no administration in time range)  amLODipine (NORVASC) tablet 10 mg (has no administration in time range)  cloNIDine (CATAPRES) tablet 0.2 mg (has no administration in time range)  furosemide (LASIX) tablet 40 mg (has no administration in time range)  metoprolol tartrate (LOPRESSOR) tablet 25 mg (has no administration in time range)  polyethylene glycol (MIRALAX / GLYCOLAX) packet 17 g (has no administration in time range)  folic acid (FOLVITE) tablet 1 mg (has no administration in time range)  vitamin B-12 (CYANOCOBALAMIN) tablet 1,000 mcg (has no administration in time range)  albuterol (PROVENTIL) (2.5 MG/3ML) 0.083% nebulizer solution 2.5 mg (has no administration in time range)  enoxaparin (LOVENOX) injection 40 mg (has no administration in time range)  sodium chloride flush (NS) 0.9 % injection 3 mL (has no administration in time range)  acetaminophen (TYLENOL) tablet 650 mg (has no administration in time range)    Or  acetaminophen (TYLENOL) suppository 650 mg (has no administration in time range)  aspirin EC tablet 81 mg (has no administration in time range)  lactated ringers infusion ( Intravenous New Bag/Given 09/23/20 2214)  lactated ringers bolus 1,000 mL (0 mLs Intravenous Stopped 09/23/20 1953)  fentaNYL (SUBLIMAZE) injection 50 mcg (50 mcg Intravenous Given 09/23/20 1632)  metoCLOPramide (REGLAN) injection 10 mg (10 mg Intravenous Given 09/23/20 1816)  diphenhydrAMINE (BENADRYL) injection 25 mg (25 mg Intravenous Given 09/23/20 1815)  acetaminophen (TYLENOL) tablet 650 mg (650 mg Oral Given 09/23/20 1814)  fentaNYL (SUBLIMAZE) injection 50 mcg (50 mcg  Intravenous Given 09/23/20 1814)  iohexol (OMNIPAQUE) 350 MG/ML injection 100 mL (100 mLs Intravenous Contrast Given 09/23/20 1849)  fentaNYL (SUBLIMAZE) injection 75 mcg (75 mcg Intravenous Given 09/23/20 2202)    ED Course  I have reviewed the triage vital signs and the nursing notes.  Pertinent labs & imaging results that were available during my care of the patient were reviewed by me and considered in my medical decision making (see chart for details).    MDM Rules/Calculators/A&P HEAR Score: 4                        The patient is a 44yo male, PMH hereditary spherocytosis, CKD, HFpEF who presents to the ED for neurologic symptoms, chest pain, abdominal pain, headache.  On my initial evaluation, the patient is hypertensive but otherwise hemodynamically stable, afebrile, nontoxic-appearing. Physical exam remarkable for gross ataxia bilaterally, decreased sensation on the  right, unable to stand without assistance.  Differentials considered include CVA, ICH, aortic dissection, ACS, hypertensive emergency. I am most concerned for CVA. EKG with new TWI in lateral leads with ST depression.  Obtained records from [facility], and reviewed those records.  Patient had been seen and left AMA or eloped to total times this week.  His work-up there were remarkable for new strokes as seen on CT scans and an MRI.  He had been admitted for further stroke work-up and evaluation and management.  Patient provided IV fentanyl and a headache cocktail consisting of p.o. Tylenol, IV fluid bolus, IV Reglan, IV Benadryl for headache. Labs remarkable for elevated hemoglobin, slightly elevated from [place]'s labs.  TA dissection study remarkable for no dissection.  CT head Noncon here did confirm new strokes as well on her imaging and were also remarkable for new bilateral nasal bone fractures.  Spoke with the patient, who reported that part of the reason why he wanted to leave [facility] was that he was sitting in a  wheelchair asking for help, but no one helped him and he fell out of his wheelchair, then the wheelchair landed on his face.  I reevaluated the patient, and he does have some tenderness to his nose but no gross on his face, do not think the patient requires a CT of his face. I recommended patient follow up with ENT as an outpatient after an admission for stroke workup.   Consulted neurology for stroke work-up.  Consulted hospitalist for admission for further evaluation of stroke.  On reevaluation, patient reports only very mild improvement in pain and is requesting to leave AMA.  Provided another dose of IV fentanyl for pain and patient agreed to stay after requesting more IV fluids, which were provided.  Patient transitioned to hospitalist service with no further acute events while under my care.  The care of this patient was overseen by Dr. Fredderick Phenix, who agreed with evaluation and plan of care.   Final Clinical Impression(s) / ED Diagnoses Final diagnoses:  Cerebrovascular accident (CVA), unspecified mechanism (HCC)  Primary hypertension  Other chest pain    Rx / DC Orders ED Discharge Orders    None       Loletha Carrow, MD 09/23/20 2308    Rolan Bucco, MD 09/23/20 2325

## 2020-09-23 NOTE — ED Triage Notes (Signed)
Pt reports being at [facility] for a stroke and left because they were not taking care of him. States he is weak, SOB, and having slurred speech. Endorses chest, head and throat pain.

## 2020-09-23 NOTE — ED Notes (Signed)
Dr. Fredderick Phenix notified of pt request for medication for headache.

## 2020-09-24 ENCOUNTER — Observation Stay (HOSPITAL_COMMUNITY): Payer: Medicaid - Out of State

## 2020-09-24 ENCOUNTER — Encounter (HOSPITAL_COMMUNITY): Payer: Self-pay | Admitting: Internal Medicine

## 2020-09-24 DIAGNOSIS — Z87891 Personal history of nicotine dependence: Secondary | ICD-10-CM | POA: Diagnosis not present

## 2020-09-24 DIAGNOSIS — I130 Hypertensive heart and chronic kidney disease with heart failure and stage 1 through stage 4 chronic kidney disease, or unspecified chronic kidney disease: Secondary | ICD-10-CM | POA: Diagnosis present

## 2020-09-24 DIAGNOSIS — R0789 Other chest pain: Secondary | ICD-10-CM | POA: Diagnosis present

## 2020-09-24 DIAGNOSIS — I340 Nonrheumatic mitral (valve) insufficiency: Secondary | ICD-10-CM | POA: Diagnosis not present

## 2020-09-24 DIAGNOSIS — X58XXXA Exposure to other specified factors, initial encounter: Secondary | ICD-10-CM | POA: Diagnosis present

## 2020-09-24 DIAGNOSIS — I639 Cerebral infarction, unspecified: Secondary | ICD-10-CM

## 2020-09-24 DIAGNOSIS — R4701 Aphasia: Secondary | ICD-10-CM | POA: Diagnosis present

## 2020-09-24 DIAGNOSIS — E785 Hyperlipidemia, unspecified: Secondary | ICD-10-CM | POA: Diagnosis present

## 2020-09-24 DIAGNOSIS — G8929 Other chronic pain: Secondary | ICD-10-CM | POA: Diagnosis present

## 2020-09-24 DIAGNOSIS — I10 Essential (primary) hypertension: Secondary | ICD-10-CM | POA: Diagnosis not present

## 2020-09-24 DIAGNOSIS — F1490 Cocaine use, unspecified, uncomplicated: Secondary | ICD-10-CM | POA: Diagnosis present

## 2020-09-24 DIAGNOSIS — G8191 Hemiplegia, unspecified affecting right dominant side: Secondary | ICD-10-CM | POA: Diagnosis present

## 2020-09-24 DIAGNOSIS — I6389 Other cerebral infarction: Secondary | ICD-10-CM

## 2020-09-24 DIAGNOSIS — I63412 Cerebral infarction due to embolism of left middle cerebral artery: Secondary | ICD-10-CM | POA: Diagnosis present

## 2020-09-24 DIAGNOSIS — Z832 Family history of diseases of the blood and blood-forming organs and certain disorders involving the immune mechanism: Secondary | ICD-10-CM | POA: Diagnosis not present

## 2020-09-24 DIAGNOSIS — I5032 Chronic diastolic (congestive) heart failure: Secondary | ICD-10-CM | POA: Diagnosis present

## 2020-09-24 DIAGNOSIS — R4782 Fluency disorder in conditions classified elsewhere: Secondary | ICD-10-CM | POA: Diagnosis present

## 2020-09-24 DIAGNOSIS — R471 Dysarthria and anarthria: Secondary | ICD-10-CM | POA: Diagnosis present

## 2020-09-24 DIAGNOSIS — Z20822 Contact with and (suspected) exposure to covid-19: Secondary | ICD-10-CM | POA: Diagnosis not present

## 2020-09-24 DIAGNOSIS — I5030 Unspecified diastolic (congestive) heart failure: Secondary | ICD-10-CM

## 2020-09-24 DIAGNOSIS — Z885 Allergy status to narcotic agent status: Secondary | ICD-10-CM | POA: Diagnosis not present

## 2020-09-24 DIAGNOSIS — N189 Chronic kidney disease, unspecified: Secondary | ICD-10-CM

## 2020-09-24 DIAGNOSIS — R270 Ataxia, unspecified: Secondary | ICD-10-CM | POA: Diagnosis present

## 2020-09-24 DIAGNOSIS — I361 Nonrheumatic tricuspid (valve) insufficiency: Secondary | ICD-10-CM | POA: Diagnosis not present

## 2020-09-24 DIAGNOSIS — N1831 Chronic kidney disease, stage 3a: Secondary | ICD-10-CM | POA: Diagnosis present

## 2020-09-24 DIAGNOSIS — S022XXA Fracture of nasal bones, initial encounter for closed fracture: Secondary | ICD-10-CM | POA: Diagnosis present

## 2020-09-24 DIAGNOSIS — Z79899 Other long term (current) drug therapy: Secondary | ICD-10-CM | POA: Diagnosis not present

## 2020-09-24 DIAGNOSIS — N179 Acute kidney failure, unspecified: Secondary | ICD-10-CM | POA: Diagnosis not present

## 2020-09-24 DIAGNOSIS — R1310 Dysphagia, unspecified: Secondary | ICD-10-CM | POA: Diagnosis present

## 2020-09-24 DIAGNOSIS — Z9114 Patient's other noncompliance with medication regimen: Secondary | ICD-10-CM | POA: Diagnosis not present

## 2020-09-24 LAB — CBC
HCT: 45 % (ref 39.0–52.0)
Hemoglobin: 16.3 g/dL (ref 13.0–17.0)
MCH: 32 pg (ref 26.0–34.0)
MCHC: 36.2 g/dL — ABNORMAL HIGH (ref 30.0–36.0)
MCV: 88.2 fL (ref 80.0–100.0)
Platelets: 301 10*3/uL (ref 150–400)
RBC: 5.1 MIL/uL (ref 4.22–5.81)
RDW: 12.9 % (ref 11.5–15.5)
WBC: 6.9 10*3/uL (ref 4.0–10.5)
nRBC: 0 % (ref 0.0–0.2)

## 2020-09-24 LAB — TSH: TSH: 1.938 u[IU]/mL (ref 0.350–4.500)

## 2020-09-24 LAB — BASIC METABOLIC PANEL
Anion gap: 13 (ref 5–15)
BUN: 12 mg/dL (ref 6–20)
CO2: 22 mmol/L (ref 22–32)
Calcium: 9.2 mg/dL (ref 8.9–10.3)
Chloride: 101 mmol/L (ref 98–111)
Creatinine, Ser: 1.61 mg/dL — ABNORMAL HIGH (ref 0.61–1.24)
GFR, Estimated: 53 mL/min — ABNORMAL LOW (ref >60)
Glucose, Bld: 95 mg/dL (ref 70–99)
Potassium: 4.1 mmol/L (ref 3.5–5.1)
Sodium: 136 mmol/L (ref 135–145)

## 2020-09-24 LAB — ECHOCARDIOGRAM COMPLETE
Area-P 1/2: 2.62 cm2
Height: 74 in
P 1/2 time: 897 msec
S' Lateral: 2.9 cm
Weight: 2548.8 oz

## 2020-09-24 LAB — RAPID URINE DRUG SCREEN, HOSP PERFORMED
Amphetamines: NOT DETECTED
Barbiturates: NOT DETECTED
Benzodiazepines: NOT DETECTED
Cocaine: POSITIVE — AB
Opiates: POSITIVE — AB
Tetrahydrocannabinol: POSITIVE — AB

## 2020-09-24 LAB — LIPID PANEL
Cholesterol: 186 mg/dL (ref 0–200)
HDL: 44 mg/dL (ref >40)
LDL Cholesterol: 130 mg/dL — ABNORMAL HIGH (ref 0–99)
Total CHOL/HDL Ratio: 4.2 RATIO
Triglycerides: 62 mg/dL (ref <150)
VLDL: 12 mg/dL (ref 0–40)

## 2020-09-24 LAB — HIV ANTIBODY (ROUTINE TESTING W REFLEX): HIV Screen 4th Generation wRfx: NONREACTIVE

## 2020-09-24 LAB — VITAMIN B12: Vitamin B-12: 478 pg/mL (ref 180–914)

## 2020-09-24 LAB — RESPIRATORY PANEL BY RT PCR (FLU A&B, COVID)
Influenza A by PCR: NEGATIVE
Influenza B by PCR: NEGATIVE
SARS Coronavirus 2 by RT PCR: NEGATIVE

## 2020-09-24 LAB — HEMOGLOBIN A1C
Hgb A1c MFr Bld: 4.8 % (ref 4.8–5.6)
Mean Plasma Glucose: 91.06 mg/dL

## 2020-09-24 LAB — TROPONIN I (HIGH SENSITIVITY): Troponin I (High Sensitivity): 25 ng/L — ABNORMAL HIGH (ref <18)

## 2020-09-24 IMAGING — CT CT HEAD W/O CM
4 series · 16 of 47 positions shown, 18 images · non-contrast
Comparison: Yesterday

CLINICAL DATA: Severe headache.  History of stroke.

EXAM:
CT HEAD WITHOUT CONTRAST
TECHNIQUE: Contiguous axial images were obtained from the base of the skull
through the vertex without intravenous contrast.

[Series 3: head wo · axial · 0.42mm/px · z∈[-70,+45]mm · 7 of 31 slices shown, 9 images]
[im 4/31  brain]
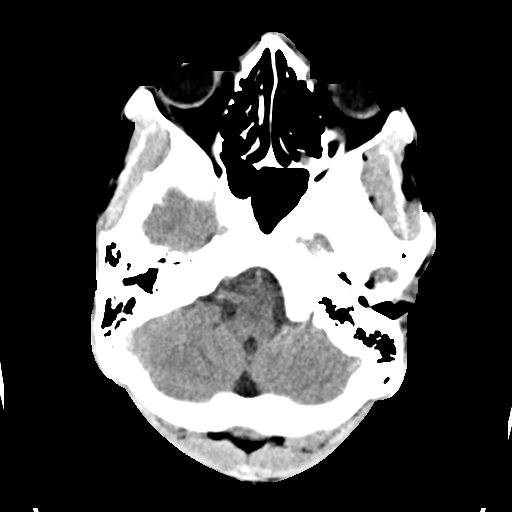
[im 4/31  bone]
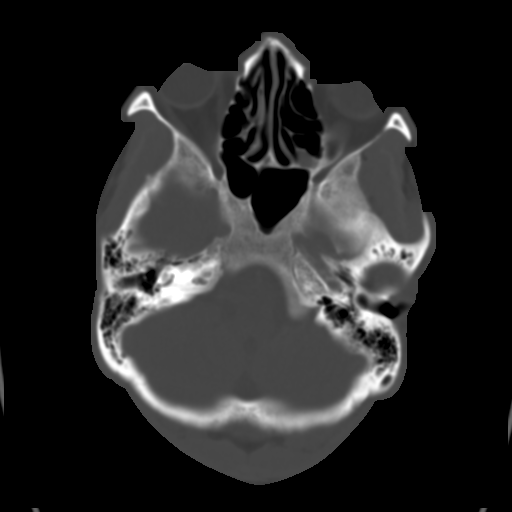
[im 8/31  brain]
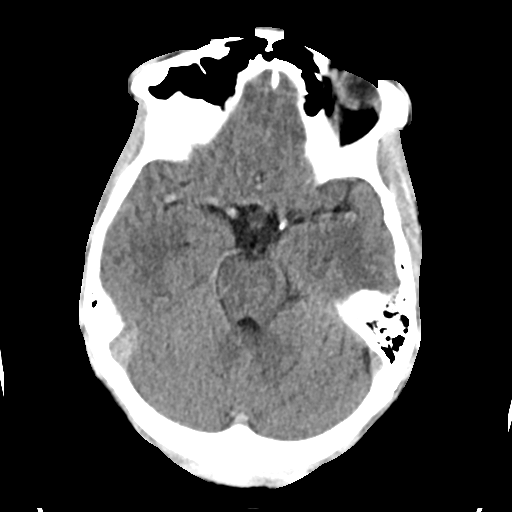
[im 12/31  brain]
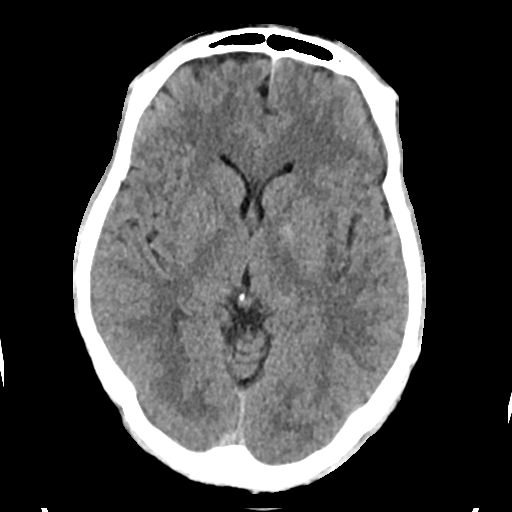
[im 16/31  brain]
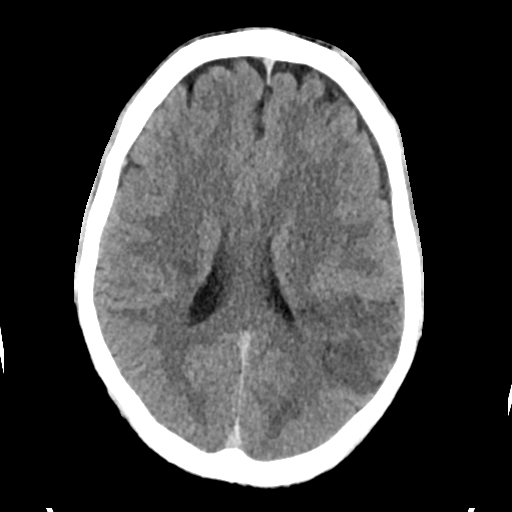
[im 19/31  brain]
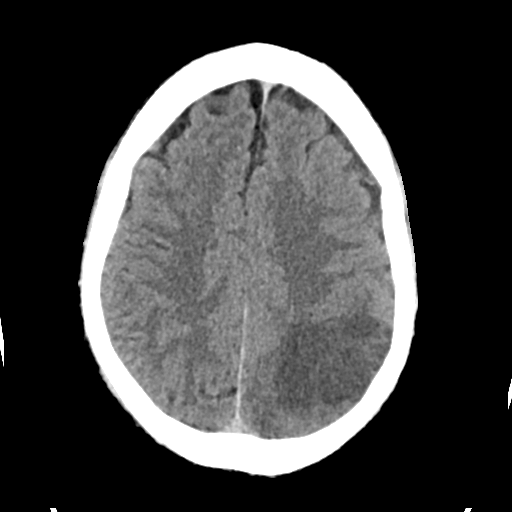
[im 19/31  bone]
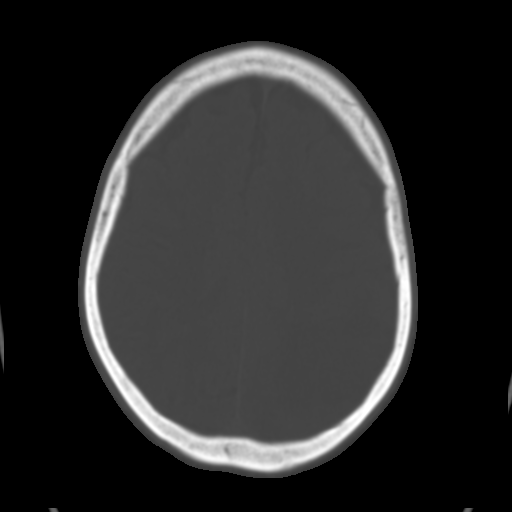
[im 23/31  brain]
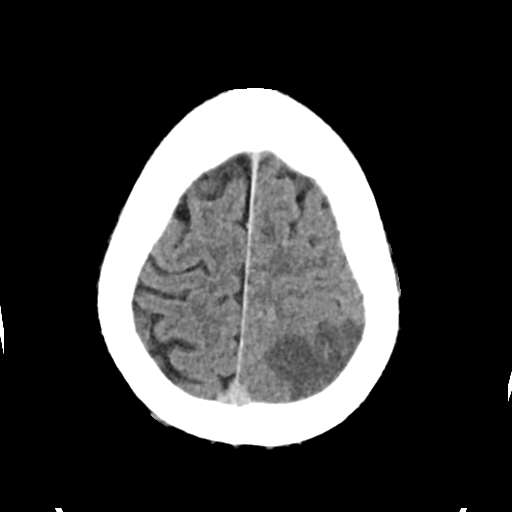
[im 27/31  brain]
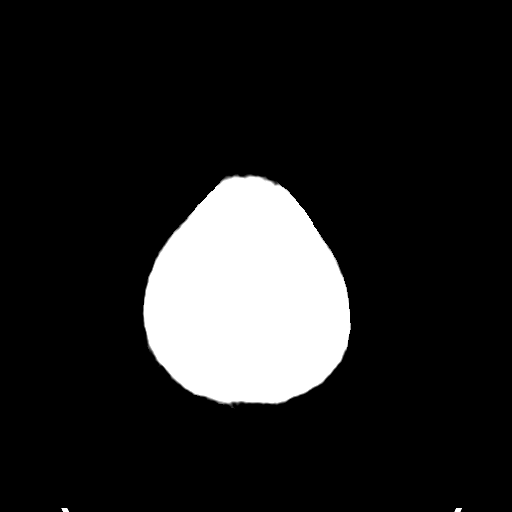

[Series 4: head bone · axial · 0.42mm/px · z∈[-71,-39]mm · 3 of 78 slices shown]
[im 8/78  bone]
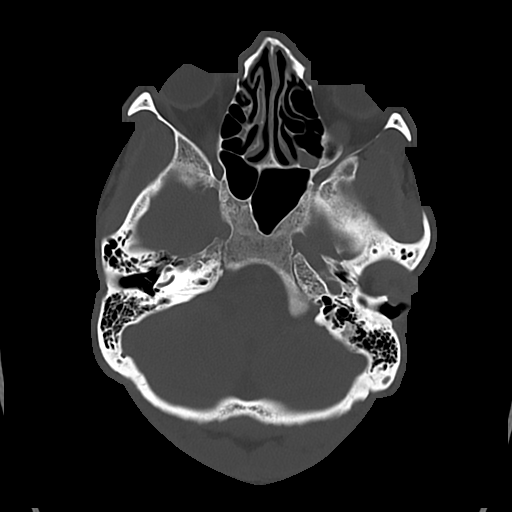
[im 16/78  bone]
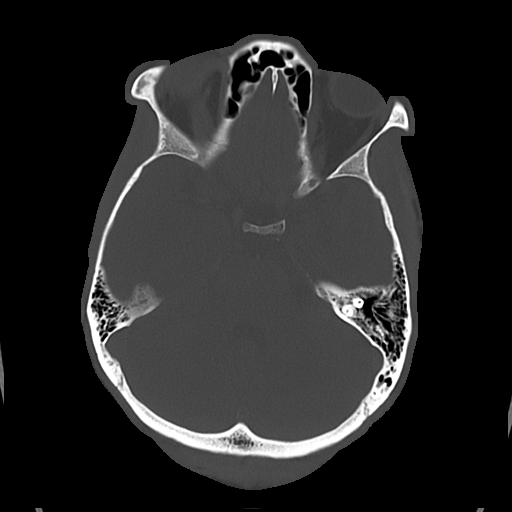
[im 24/78  bone]
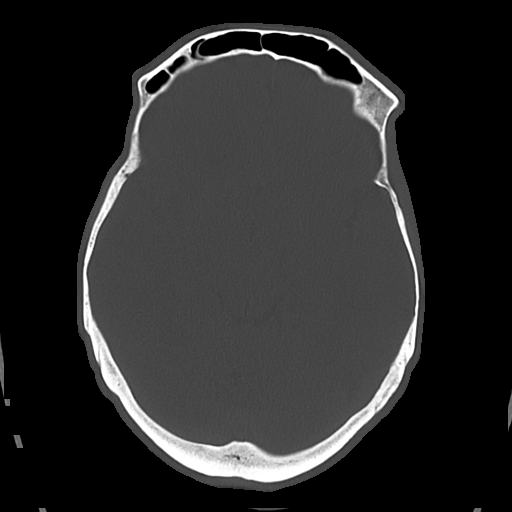

[Series 5: cor soft · coronal · 0.31mm/px · 3 of 71 slices shown]
[im 24/71  brain]
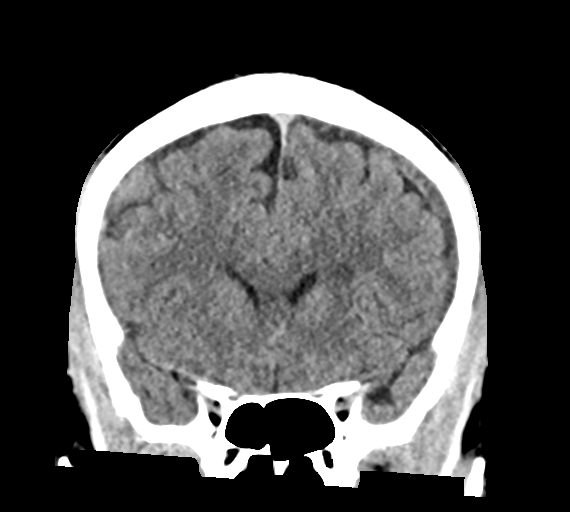
[im 32/71  brain]
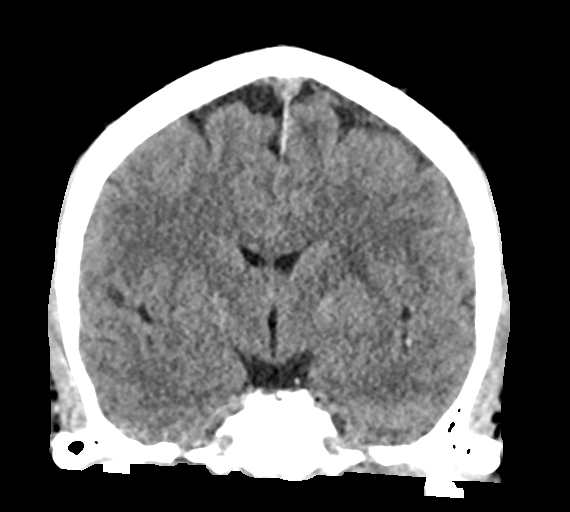
[im 39/71  brain]
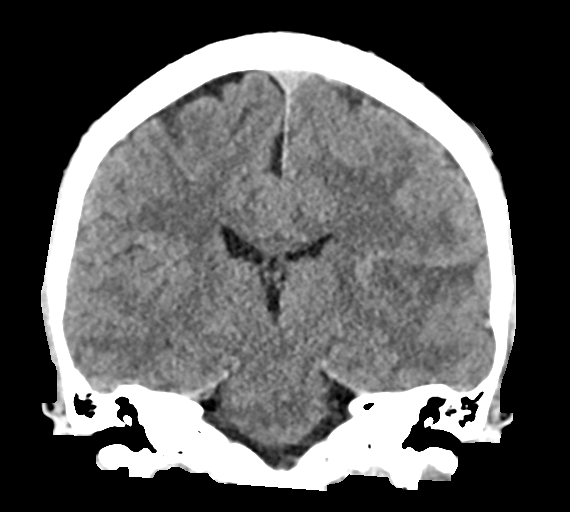

[Series 6: sag soft · sagittal · 0.29mm/px · 3 of 56 slices shown]
[im 19/56  brain]
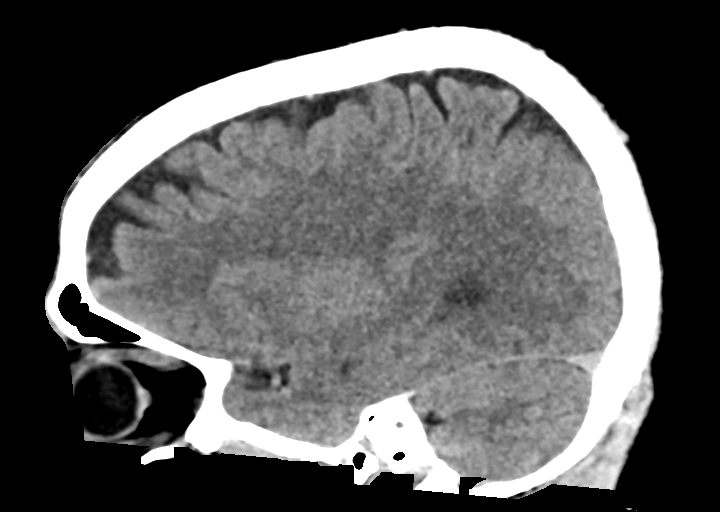
[im 28/56  brain]
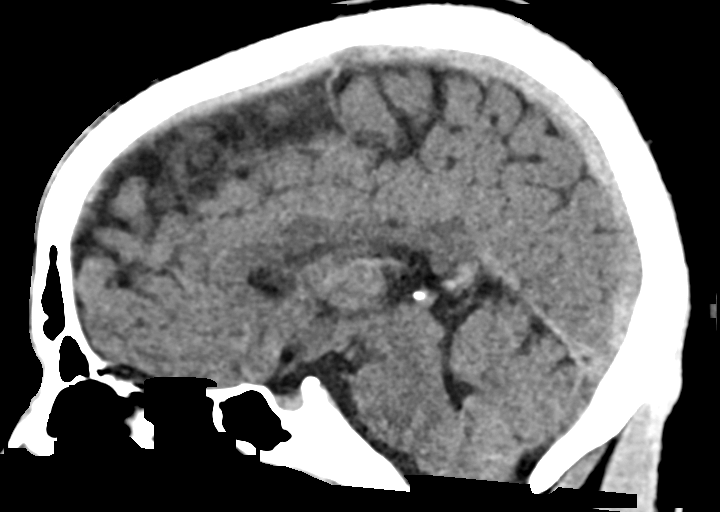
[im 37/56  brain]
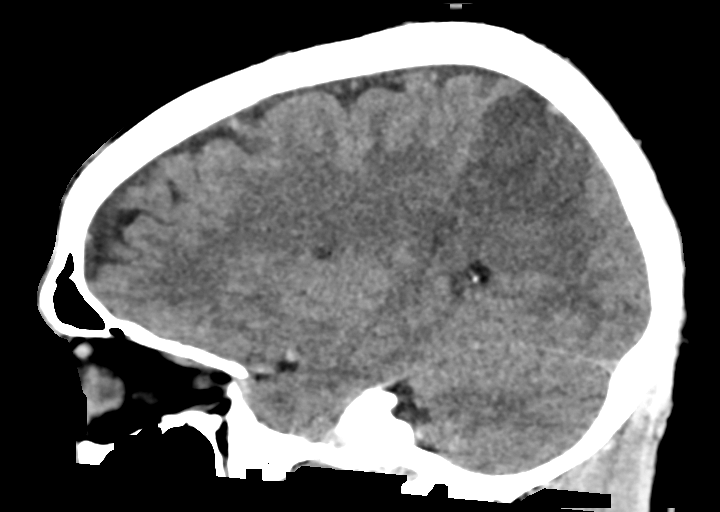

[16 of 47 positions shown; findings below may reference images not displayed]

FINDINGS: Brain: More well-defined cytotoxic edema in the left frontal
parietal convexity, expected. Subacute infarct at the right corona
radiata is unchanged. Chronic lacunar infarct at the right thalamus
and left corona radiata. No acute hemorrhage, hydrocephalus, or
significant mass effect.

Vascular: No hyperdense vessel or unexpected calcification.

Skull: Remote nasal bone fractures.

Sinuses/Orbits: No acute finding.
IMPRESSION: 1. No new abnormality.
2. Acute left MCA branch infarct.
3. Subacute right corona radiata infarct.
4. Chronic lacunar infarcts.

## 2020-09-24 MED ORDER — OXYCODONE HCL 5 MG PO TABS
10.0000 mg | ORAL_TABLET | Freq: Once | ORAL | Status: AC
  Administered 2020-09-24: 10 mg via ORAL
  Filled 2020-09-24: qty 2

## 2020-09-24 MED ORDER — ATORVASTATIN CALCIUM 40 MG PO TABS
40.0000 mg | ORAL_TABLET | Freq: Every day | ORAL | Status: DC
  Administered 2020-09-25 – 2020-09-28 (×4): 40 mg via ORAL
  Filled 2020-09-24 (×4): qty 1

## 2020-09-24 MED ORDER — HYDROMORPHONE HCL 1 MG/ML IJ SOLN
0.5000 mg | INTRAMUSCULAR | Status: DC | PRN
  Administered 2020-09-24 – 2020-09-27 (×15): 0.5 mg via INTRAVENOUS
  Filled 2020-09-24 (×16): qty 0.5

## 2020-09-24 MED ORDER — CLOPIDOGREL BISULFATE 75 MG PO TABS
75.0000 mg | ORAL_TABLET | Freq: Every day | ORAL | Status: DC
  Administered 2020-09-24 – 2020-09-28 (×5): 75 mg via ORAL
  Filled 2020-09-24 (×4): qty 1

## 2020-09-24 NOTE — Progress Notes (Signed)
STROKE TEAM PROGRESS NOTE   INTERVAL HISTORY Pt initially seen at vascular lab for TCD bubble study. Later seen again in room with wife at bedside. Pt right sided weakness much improved from prior. Speech also improving but still has frequent paraphasic errors and word finding difficulty. UDS positive for cocaine.   Vitals:   09/24/20 0430 09/24/20 0500 09/24/20 0613 09/24/20 1230  BP: (!) 159/85 (!) 148/87 (!) 155/87 (!) 161/111  Pulse: (!) 55 (!) 59 (!) 55 (!) 54  Resp: 17 16 16 18   Temp:   97.7 F (36.5 C) 99.5 F (37.5 C)  TempSrc:   Oral Oral  SpO2: 99% 99% 100% 100%  Weight:   72.3 kg   Height:   6\' 2"  (1.88 m)    CBC:  Recent Labs  Lab 09/23/20 1407 09/24/20 0238  WBC 6.7 6.9  NEUTROABS 3.9  --   HGB 17.9* 16.3  HCT 49.6 45.0  MCV 86.6 88.2  PLT 315 301   Basic Metabolic Panel:  Recent Labs  Lab 09/23/20 1407 09/24/20 0238  NA 138 136  K 4.2 4.1  CL 100 101  CO2 25 22  GLUCOSE 101* 95  BUN 11 12  CREATININE 1.54* 1.61*  CALCIUM 9.8 9.2   Lipid Panel:  Recent Labs  Lab 09/24/20 0238  CHOL 186  TRIG 62  HDL 44  CHOLHDL 4.2  VLDL 12  LDLCALC 13/05/21*   HgbA1c:  Recent Labs  Lab 09/24/20 0238  HGBA1C 4.8   Urine Drug Screen:  Recent Labs  Lab 09/24/20 1030  LABOPIA POSITIVE*  COCAINSCRNUR POSITIVE*  LABBENZ NONE DETECTED  AMPHETMU NONE DETECTED  THCU POSITIVE*  LABBARB NONE DETECTED    Alcohol Level No results for input(s): ETH in the last 168 hours.  IMAGING past 24 hours CT HEAD WO CONTRAST  Result Date: 09/24/2020 CLINICAL DATA:  Severe headache.  History of stroke. EXAM: CT HEAD WITHOUT CONTRAST TECHNIQUE: Contiguous axial images were obtained from the base of the skull through the vertex without intravenous contrast. COMPARISON:  Yesterday FINDINGS: Brain: More well-defined cytotoxic edema in the left frontal parietal convexity, expected. Subacute infarct at the right corona radiata is unchanged. Chronic lacunar infarct at the right  thalamus and left corona radiata. No acute hemorrhage, hydrocephalus, or significant mass effect. Vascular: No hyperdense vessel or unexpected calcification. Skull: Remote nasal bone fractures. Sinuses/Orbits: No acute finding. IMPRESSION: 1. No new abnormality. 2. Acute left MCA branch infarct. 3. Subacute right corona radiata infarct. 4. Chronic lacunar infarcts. Electronically Signed   By: 13/05/21 M.D.   On: 09/24/2020 06:07   CT HEAD WO CONTRAST  Result Date: 09/23/2020 CLINICAL DATA:  Neuro deficit, acute stroke suspected. EXAM: CT HEAD WITHOUT CONTRAST TECHNIQUE: Contiguous axial images were obtained from the base of the skull through the vertex without intravenous contrast. COMPARISON:  Noncontrast head CT 09/20/2020. CT angiogram head/neck 09/20/2020. Noncontrast head CT 09/21/2020. Brain MRI 09/22/2020. FINDINGS: Brain: Cerebral volume is normal. A moderate to large acute/early subacute cortical and subcortical infarct within the left parietal lobe, posterosuperior left temporal lobe and posterior left insula has not significantly changed in extent as compared to the brain MRI performed one day prior. Subtle petechial hemorrhage is questioned, but there is no frank hemorrhagic conversion. Regional mass effect. No midline shift A small subacute infarct within the right corona radiata was better appreciated on the prior MRI. Redemonstrated chronic lacunar infarcts within the bilateral cerebral white matter, left basal ganglia and right thalamus. No  extra-axial fluid collection. No evidence of intracranial mass. No midline shift. Vascular: Atherosclerotic calcifications. Skull: Normal. Negative for fracture or focal lesion. Sinuses/Orbits: Visualized orbits show no acute finding. Mild paranasal sinus mucosal thickening, most notably ethmoidal. Other: There are acute, comminuted and displaced bilateral nasal bone fractures, new as compared to prior examinations. Partial opacification of the right  nasal passage. Suspected additional minimally displaced acute fractures of the bony nasal septum (for instance as seen on series 4, images 16 and 17). IMPRESSION: A moderate to large acute/early subacute cortical and subcortical infarct within the left MCA vascular territory has not significantly changed in extent as compared to the brain MRI performed one day prior. There is regional mass effect but no midline shift. Subtle petechial hemorrhage is questioned, but there is no frank hemorrhagic conversion. A small subacute infarct within the right corona radiata was better appreciated on the prior MRI. Redemonstrated chronic lacunar infarcts within the bilateral cerebral white matter, left basal ganglia and right thalamus. Acute, comminuted and displaced bilateral nasal bone fractures which are new as compared to the prior examinations. Suspected minimally displaced acute fractures of the bony nasal septum. Partial opacification of the right nasal passage. Consider dedicated maxillofacial CT for further evaluation. Electronically Signed   By: Jackey Loge DO   On: 09/23/2020 19:23   DG Chest Portable 1 View  Result Date: 09/23/2020 CLINICAL DATA:  Chest pain EXAM: PORTABLE CHEST 1 VIEW COMPARISON:  September 21, 2020 FINDINGS: The heart size and mediastinal contours are within normal limits. Both lungs are clear. The visualized skeletal structures are unremarkable. IMPRESSION: No active disease. Electronically Signed   By: Jonna Clark M.D.   On: 09/23/2020 16:49   CT Angio Chest/Abd/Pel for Dissection W and/or Wo Contrast  Result Date: 09/23/2020 CLINICAL DATA:  Abdominal pain, question aortic dissection EXAM: CT ANGIOGRAPHY CHEST, ABDOMEN AND PELVIS TECHNIQUE: Non-contrast CT of the chest was initially obtained. Multidetector CT imaging through the chest, abdomen and pelvis was performed using the standard protocol during bolus administration of intravenous contrast. Multiplanar reconstructed images and MIPs  were obtained and reviewed to evaluate the vascular anatomy. CONTRAST:  OMNIPAQUE IOHEXOL 350 MG/ML SOLN COMPARISON:  None. FINDINGS: CTA CHEST FINDINGS Cardiovascular: --Heart: The heart size is normal.  There is nopericardial effusion. --Aorta: The course and caliber of the thoracic aorta are normal. There is no aortic atherosclerotic calcification. Precontrast images show no aortic intramural hematoma. There is no blood pool, dissection or penetrating ulcer demonstrated on arterial phase postcontrast imaging. There is a origin of the left carotid artery the brachiocephalic trunk. Minimal calcifications seen at the origin of brachiocephalic trunk. The proximal arch vessels are widely patent. --Pulmonary Arteries: Contrast timing is optimized for preferential opacification of the aorta. Within that limitation, normal central pulmonary arteries. Mediastinum/Nodes: No mediastinal, hilar or axillary lymphadenopathy. The visualized thyroid and thoracic esophageal course are unremarkable. Lungs/Pleura: Mild ground-glass opacity seen at the posterior right lung base. There is a calcified granuloma seen in the right middle lobe. No focal airspace consolidation. No focal pleural abnormality. Musculoskeletal: No chest wall abnormality. No acute osseous findings. Review of the MIP images confirms the above findings. CTA ABDOMEN AND PELVIS FINDINGS VASCULAR Aorta: Normal caliber aorta without aneurysm, dissection, vasculitis or hemodynamically significant stenosis. There is scattered mild aortic atherosclerosis at the aorta bi-iliac bifurcation. Celiac: No aneurysm, dissection or hemodynamically significant stenosis. Normal branching pattern SMA: Widely patent without dissection or stenosis. Renals: Single renal arteries bilaterally. No aneurysm, dissection, stenosis or evidence  of fibromuscular dysplasia. IMA: Patent without abnormality. Inflow: Scattered atherosclerosis at the common iliac vasculature. Veins: Normal  course and caliber of the major veins. Assessment is otherwise limited by the arterial dominant contrast phase. Review of the MIP images confirms the above findings. NON-VASCULAR Hepatobiliary: Normal hepatic contours and density. No visible biliary dilatation. The patient is status post cholecystectomy. No biliary ductal dilation. Pancreas: Normal contours without ductal dilatation. No peripancreatic fluid collection. 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 Adrenals/Urinary Tract: --Adrenal glands: Normal. --Right kidney/ureter: No hydronephrosis or perinephric stranding. No nephrolithiasis. No obstructing ureteral stones. --Left kidney/ureter: No hydronephrosis or perinephric stranding. No nephrolithiasis. No obstructing ureteral stones. --Urinary bladder: Unremarkable. Stomach/Bowel: --Stomach/Duodenum: No hiatal hernia or other gastric abnormality. Normal duodenal course and caliber. --Small bowel: No dilatation or inflammation. --Colon: No focal abnormality. --Appendix: Normal. Lymphatic:  No abdominal or pelvic lymphadenopathy. Reproductive: No free fluid in the pelvis. Musculoskeletal. No bony spinal canal stenosis or focal osseous abnormality. Other: None. Review of the MIP images confirms the above findings. IMPRESSION: No acute aortic abnormality. Mild aortic atherosclerosis.  Aortic Atherosclerosis (ICD10-I70.0). No other acute intrathoracic or pelvic pathology. Electronically Signed   By: Jonna ClarkBindu  Avutu M.D.   On: 09/23/2020 19:14    CT CTA head and neck 09/20/20 No acute intracranial hemorrhage. ASPECT score is 10. There are new age-indeterminate small infarcts of the right thalamus and bilateral corona radiata. No large vessel occlusion or hemodynamically significant stenosis in the neck. No proximal intracranial vessel occlusion. Mild intracranial atherosclerosis.   CT head 11/2 New large low density area is seen involving the left parietal cortex concerning for acute infarction  or edema due to underlying neoplasm. MRI with and without gadolinium administration is recommended for further evaluation.   MRI brain 11/3 Acute infarcts involving the left MCA territory and right corona radiata. Chronic right thalamic, left basal ganglia and left periventricular white matter lacunar insults. No mass lesion. Motion degraded exam.   PHYSICAL EXAM  Temp:  [97.7 F (36.5 C)-99.5 F (37.5 C)] 99.5 F (37.5 C) (11/05 1230) Pulse Rate:  [53-62] 59 (11/05 2004) Resp:  [8-29] 13 (11/05 2200) BP: (123-191)/(71-151) 183/92 (11/05 2004) SpO2:  [92 %-100 %] 100 % (11/05 2004) Weight:  [72.3 kg] 72.3 kg (11/05 0613)  General - Well nourished, well developed, in no apparent distress, frustrated about nonfluent aphasia.  Ophthalmologic - fundi not visualized due to noncooperation.  Cardiovascular - Regular rhythm and rate.  Mental Status -  Level of arousal and orientation to time, place, and person were intact. Comprehension was assessed and found intact. However, naming 2/3, repeating limited with 5-word sentences, frequent paraphasic errors and word finding difficulty  Cranial Nerves II - XII - II - decreased visual acuity OU. Left eye tunnel vision, able to see HW. Right eye able to see HW with right hemianopia III, IV, VI - Extraocular movements intact. V - Facial sensation intact bilaterally. VII - Facial movement intact bilaterally. VIII - Hearing & vestibular intact bilaterally. X - Palate elevates symmetrically. XI - Chin turning & shoulder shrug intact bilaterally. XII - Tongue protrusion intact.  Motor Strength - The patient's strength was normal in all extremities and pronator drift was absent. Subtle right hand dexterity difficulty.  Bulk was normal and fasciculations were absent.   Motor Tone - Muscle tone was assessed at the neck and appendages and was normal.  Reflexes - The patient's reflexes were symmetrical in all extremities and he had no  pathological reflexes.  Sensory -  Light touch, temperature/pinprick were assessed and were symmetrical.    Coordination - The patient had normal movements in the hands with no ataxia or dysmetria.  Tremor was absent.  Gait and Station - deferred.   ASSESSMENT/PLAN Mr. Bishop Vanderwerf is a 44 y.o. male with history of HTN, acquired TTP status post splenectomy,pulmonary hypertension, HFpEF, CKD presented with known CVA presenting to [facility] with R sided weakness but left AMA, then represented to [facility] ED with weakness, slurred speech, SOB, CP, nausea, throat and head pain.    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   CT head 11/1 [facility]) no acute abnormality. ASPECTS 10  CTA head & neck 1/11 [facility]) mild intracranial atherosclerosis   CT head 11/2 [facility]) new L parietal cortex infarct   MRI brain 11/3 [facility]) L MCA and R corona radiata infarcts. Chronic R thalamic, L basal ganglia and L periventricular white matter lacunes.  CT head 11/4 moderate large acute/subacute cortical and subcortical L MCA infarct w/ mass effect and subtle petechial hemorrhage. Chronic lacunes B cerebral white matter, L basal ganglia and R thalamus. Acute comminuted and displaced B nasal fx.   CT head 11/5 no new abnormality. L MCA branch infarct. Subacute R corona radiata infarct. Chronic lacunes.   LE Doppler no DVT  Transcranial doppler no RTL shunting   2D Echo EF 55-60%  Recommend outpt cardiac event monitoring to rule out afib  LDL 130  HgbA1c 4.8  hypercoag work up pending  UDS + cocaine  VTE prophylaxis - Lovenox 40 mg sq daily   No antithrombotic prior to admission, now on aspirin 81 mg daily and clopidogrel 75 mg daily DAPT for 3 weeks and then ASA alone.   Therapy recommendations:  outpt PT   Disposition:  pending   Hypertension  Stable at  high end on admission  Not compliant with meds at home . Permissive hypertension (OK if < 220/120) but gradually normalize in 2-3 days . Long-term BP goal normotensive  Hyperlipidemia  Home meds:  No statin   Now on lipitor 40  LDL 130, goal < 70  Continue statin at discharge  Dysphagia . Secondary to stroke . NPO for now . Initial passed screening but subsequently developed swallowing difficulty . Speech on board   [place] Substance abuse  UDS:  THC POSITIVE, Cocaine POSITIVE, opiates POSITIVE.   Patient advised to stop using cocaine and THC due to stroke risk.  Pt is willing to quit  Other Stroke Risk Factors  Former Cigarette smoker  Congestive heart failure (HFpEF)  Other Active Problems  CKD IIIa, Cre 1.61  Comminuted nasal fx w/ HA following fall out of wheelchair at [facility] - dilaudid and oxycodone PRN  Chronic pain - dilaudid and oxycodone PRN  Hospital day # 0  Neurology will sign off. Please call with questions. Pt will follow up with stroke clinic NP at [facility] in about 4 weeks. Thanks for the consult.  Marvel Plan, MD PhD Stroke Neurology 09/24/2020 11:57 PM    To contact Stroke Continuity provider, please refer to [email or web address]. After hours, contact General Neurology

## 2020-09-24 NOTE — Progress Notes (Addendum)
HOSPITAL MEDICINE OVERNIGHT EVENT NOTE    Notified by nursing the patient continues to complain of extremely atypical chest discomfort and headache.  Chart reviewed, patient has multiple confirmed strokes on CT head, apparently similar to radiographic work-up at [facility].  Considering ongoing symptoms of chest discomfort, will obtain repeat EKG and troponin.  Extremely unlikely to be plaque rupture.  Considering ongoing headache, this is possibly related to the seemingly acute nasal bone fractures incidentally seen on CT.  Nursing reports no change neurologically since presentation to our emergency department.  Will order additional dose of oral oxycodone for pain.  Marinda Elk  MD [facility]

## 2020-09-24 NOTE — Progress Notes (Signed)
Echocardiogram 2D Echocardiogram has been performed.  Alejandro Perez [place] Current 09/24/2020, 12:31 PM

## 2020-09-24 NOTE — Progress Notes (Signed)
PROGRESS NOTE                                                                             PROGRESS NOTE                                                                                                                                                                                                             Patient Demographics:    Alejandro Perez, is a 44 y.o. male, DOB - 1976-07-04, WUJ:[id number]  Outpatient Primary MD for the patient is Patient, No Pcp Per    LOS - 0  Admit date - 09/23/2020    Chief Complaint  Patient presents with  . Weakness       Brief Narrative   HPI: Alejandro Perez is a 44 y.o. male with medical history significant of acquired TTP status post splenectomy, hypertension, pulmonary hypertension, HFpEF, CKD presented with known CVA that he was being treated for at [facility], but left AMA after he was unsatisfied with his care.  Patient states that on Sunday he woke up with dizziness, falling to his left, feeling weak, and speech difficulty consisting of slurred speech with his daughter.  Patient had initial work-up at [facility] and had been there for several days.  MRI from [place] showed moderate to large subacute cortical and subcortical infarct within the left MCA vascular territory, subacute infarct in the right corona radiata, lacunar infarcts.  Patient states that he had a fall while there and landed on his face with his wheelchair landed on top of him but was unable to get help.  And that is why he left and came to [facility].  He was accompanied with some records but it is unclear what the results of his full work-up was.  Patient reports some chest tightness, chronic abdominal pain, headache.  He denies constipation or diarrhea  ED Course: Vital significant for hypertension in the ED here.  Labs showed creatinine of 1.5 stable for his baseline.  LFTs within normal limits.  CBC showed  hemoglobin of 17.  PT INR and  PTT within normal limits.  Initial troponin XVIII, second pending.  Respiratory viral panel pending when seen.  EKG showed normal sinus rhythm with LVH and QTC prolongation at 45.   Subjective:    Alejandro Perez today complains of nose pain, further chest pain or shortness of breath this morning .    Assessment  & Plan :    Principal Problem:   CVA (cerebral vascular accident) (HCC) Active Problems:   Hypertension   CKD (chronic kidney disease)   (HFpEF) heart failure with preserved ejection fraction (HCC)    Acute CVA - CT here confirmed stroke radiology was able to compare with MRI from outside hospital - Neurology consulted, follow-up recommendations for this partially previously worked up patiente  -Agement per neurology, check A1c, check lipid panel, continue with aspirin, follow on 2D echo, will need bubble study as discussed with neurology, will monitor on telemetry overnight as well, likely will need 30 days event monitor as well. -PT/OT/SLP consulted.  HTN - Resume home clonidine, amlodipine, metoprolol, Lasix  HFpEF - Resume home metoprolol, Lasix  CKD - Avoid nephrotoxic agents - Follow renal function  Head Ache Chronic Pain  - Received headache cocktail on IV opiates in ED with minimal improvement - We will provide home opioid medication, will add low-dose IV Dilaudid given pain due to nasal fracture.  Polysubstance use > Patient has a history of marijuana and recent trial of ecstasy.  He denies cocaine but has had positive cocaine on his UDS is at other hospitalizations. - Continue to monitor   SpO2: 100 %  Recent Labs  Lab 09/23/20 1407 09/23/20 2341 09/24/20 0238  WBC 6.7  --  6.9  PLT 315  --  301  AST 21  --   --   ALT 16  --   --   ALKPHOS 84  --   --   BILITOT 0.8  --   --   ALBUMIN 4.3  --   --   INR 1.0  --   --   SARSCOV2NAA  --  NEGATIVE  --        ABG     Component Value Date/Time   PHART 7.358 10/13/2019 0426   PCO2ART  54.3 (H) 10/13/2019 0426   PO2ART 59.3 (L) 10/13/2019 0426   HCO3 29.8 (H) 10/13/2019 0426   O2SAT 88.2 10/13/2019 0426       Condition - Extremely Guarded  Family Communication  :  Wife at bedside  Code Status :  Full  Consults  :  Neurology  Procedures  :  none  Disposition Plan  :    Status is: inpatient  The patient will require care spanning > 2 midnights and should be moved to inpatient because: Hemodynamically unstable and Ongoing diagnostic testing needed not appropriate for outpatient work up  Dispo: The patient is from: Home              Anticipated d/c is to: Home              Anticipated d/c date is: 1 day              Patient currently is not medically stable to d/c.      DVT Prophylaxis  :  Lovenox   Lab Results  Component Value Date   PLT 301 09/24/2020    Diet :  Diet Order  Diet NPO time specified  Diet effective now                  Inpatient Medications  Scheduled Meds: . amLODipine  10 mg Oral Daily  . aspirin EC  81 mg Oral Daily  . [START ON 09/25/2020] atorvastatin  40 mg Oral Daily  . cloNIDine  0.2 mg Oral BID  . clopidogrel  75 mg Oral Daily  . enoxaparin (LOVENOX) injection  40 mg Subcutaneous Q24H  . folic acid  1 mg Oral Daily  . furosemide  40 mg Oral BID  . metoprolol tartrate  25 mg Oral BID  . sodium chloride flush  3 mL Intravenous Once  . sodium chloride flush  3 mL Intravenous Q12H  . cyanocobalamin  1,000 mcg Oral Daily   Continuous Infusions: . lactated ringers 100 mL/hr at 09/24/20 0844   PRN Meds:.acetaminophen **OR** acetaminophen, albuterol, HYDROmorphone (DILAUDID) injection, oxyCODONE, polyethylene glycol  Antibiotics  :    Anti-infectives (From admission, onward)   None      Huey Bienenstock M.D on 09/24/2020 at 2:50 PM  To page go to [email or web address]   [facility] -  Office  [phone]     Objective:   Vitals:   09/24/20 0430 09/24/20 0500 09/24/20 0613 09/24/20 1230    BP: (!) 159/85 (!) 148/87 (!) 155/87 (!) 161/111  Pulse: (!) 55 (!) 59 (!) 55 (!) 54  Resp: 17 16 16 18   Temp:   97.7 F (36.5 C) 99.5 F (37.5 C)  TempSrc:   Oral Oral  SpO2: 99% 99% 100% 100%  Weight:   72.3 kg   Height:   6\' 2"  (1.88 m)     Wt Readings from Last 3 Encounters:  09/24/20 72.3 kg  10/09/19 73.5 kg  12/21/17 71.2 kg     Intake/Output Summary (Last 24 hours) at 09/24/2020 1450 Last data filed at 09/24/2020 1231 Gross per 24 hour  Intake --  Output 550 ml  Net -550 ml     Physical Exam  Awake Alert, Normal affect Symmetrical Chest wall movement, Good air movement bilaterally, CTAB RRR,No Gallops,Rubs or new Murmurs, No Parasternal Heave +ve B.Sounds, Abd Soft, No tenderness, No rebound - guarding or rigidity. No Cyanosis, Clubbing or edema, No new Rash or bruise    Data Review:    CBC Recent Labs  Lab 09/23/20 1407 09/24/20 0238  WBC 6.7 6.9  HGB 17.9* 16.3  HCT 49.6 45.0  PLT 315 301  MCV 86.6 88.2  MCH 31.2 32.0  MCHC 36.1* 36.2*  RDW 13.0 12.9  LYMPHSABS 1.3  --   MONOABS 0.9  --   EOSABS 0.5  --   BASOSABS 0.1  --     Recent Labs  Lab 09/23/20 1407 09/24/20 0238 09/24/20 1011  NA 138 136  --   K 4.2 4.1  --   CL 100 101  --   CO2 25 22  --   GLUCOSE 101* 95  --   BUN 11 12  --   CREATININE 1.54* 1.61*  --   CALCIUM 9.8 9.2  --   AST 21  --   --   ALT 16  --   --   ALKPHOS 84  --   --   BILITOT 0.8  --   --   ALBUMIN 4.3  --   --   INR 1.0  --   --   TSH  --   --  1.938  HGBA1C  --  4.8  --     ------------------------------------------------------------------------------------------------------------------ Recent Labs    09/24/20 0238  CHOL 186  HDL 44  LDLCALC 130*  TRIG 62  CHOLHDL 4.2    Lab Results  Component Value Date   HGBA1C 4.8 09/24/2020   ------------------------------------------------------------------------------------------------------------------ Recent Labs    09/24/20 1011  TSH  1.938    Cardiac Enzymes No results for input(s): CKMB, TROPONINI, MYOGLOBIN in the last 168 hours.  Invalid input(s): CK ------------------------------------------------------------------------------------------------------------------    Component Value Date/Time   BNP 516.0 (H) 10/14/2019 1008    Micro Results Recent Results (from the past 240 hour(s))  Respiratory Panel by RT PCR (Flu A&B, Covid) - Nasopharyngeal Swab     Status: None   Collection Time: 09/23/20 11:41 PM   Specimen: Nasopharyngeal Swab  Result Value Ref Range Status   SARS Coronavirus 2 by RT PCR NEGATIVE NEGATIVE Final    Comment: (NOTE) SARS-CoV-2 target nucleic acids are NOT DETECTED.  The SARS-CoV-2 RNA is generally detectable in upper respiratoy specimens during the acute phase of infection. The lowest concentration of SARS-CoV-2 viral copies this assay can detect is 131 copies/mL. A negative result does not preclude SARS-Cov-2 infection and should not be used as the sole basis for treatment or other patient management decisions. A negative result may occur with  improper specimen collection/handling, submission of specimen other than nasopharyngeal swab, presence of viral mutation(s) within the areas targeted by this assay, and inadequate number of viral copies (<131 copies/mL). A negative result must be combined with clinical observations, patient history, and epidemiological information. The expected result is Negative.  Fact Sheet for Patients:  https://www.moore.com/https://www.fda.gov/media/142436/download  Fact Sheet for Healthcare Providers:  https://www.young.biz/https://www.fda.gov/media/142435/download  This test is no t yet approved or cleared by the [place] FDA and  has been authorized for detection and/or diagnosis of SARS-CoV-2 by FDA under an Emergency Use Authorization (EUA). This EUA will remain  in effect (meaning this test can be used) for the duration of the COVID-19 declaration under Section 564(b)(1) of the  Act, 21 U.S.C. section 360bbb-3(b)(1), unless the authorization is terminated or revoked sooner.     Influenza A by PCR NEGATIVE NEGATIVE Final   Influenza B by PCR NEGATIVE NEGATIVE Final    Comment: (NOTE) The Xpert Xpress SARS-CoV-2/FLU/RSV assay is intended as an aid in  the diagnosis of influenza from Nasopharyngeal swab specimens and  should not be used as a sole basis for treatment. Nasal washings and  aspirates are unacceptable for Xpert Xpress SARS-CoV-2/FLU/RSV  testing.  Fact Sheet for Patients: https://www.moore.com/https://www.fda.gov/media/142436/download  Fact Sheet for Healthcare Providers: https://www.young.biz/https://www.fda.gov/media/142435/download  This test is not yet approved or cleared by the [place] FDA and  has been authorized for detection and/or diagnosis of SARS-CoV-2 by  FDA under an Emergency Use Authorization (EUA). This EUA will remain  in effect (meaning this test can be used) for the duration of the  Covid-19 declaration under Section 564(b)(1) of the Act, 21  U.S.C. section 360bbb-3(b)(1), unless the authorization is  terminated or revoked. Performed at [facility], [address] [place], [place] [address]     Radiology Reports CT HEAD WO CONTRAST  Result Date: 09/24/2020 CLINICAL DATA:  Severe headache.  History of stroke. EXAM: CT HEAD WITHOUT CONTRAST TECHNIQUE: Contiguous axial images were obtained from the base of the skull through the vertex without intravenous contrast. COMPARISON:  Yesterday FINDINGS: Brain: More well-defined cytotoxic edema in the left frontal parietal convexity, expected. Subacute infarct  at the right corona radiata is unchanged. Chronic lacunar infarct at the right thalamus and left corona radiata. No acute hemorrhage, hydrocephalus, or significant mass effect. Vascular: No hyperdense vessel or unexpected calcification. Skull: Remote nasal bone fractures. Sinuses/Orbits: No acute finding. IMPRESSION: 1. No new abnormality. 2. Acute left MCA  branch infarct. 3. Subacute right corona radiata infarct. 4. Chronic lacunar infarcts. Electronically Signed   By: Marnee Spring M.D.   On: 09/24/2020 06:07   CT HEAD WO CONTRAST  Result Date: 09/23/2020 CLINICAL DATA:  Neuro deficit, acute stroke suspected. EXAM: CT HEAD WITHOUT CONTRAST TECHNIQUE: Contiguous axial images were obtained from the base of the skull through the vertex without intravenous contrast. COMPARISON:  Noncontrast head CT 09/20/2020. CT angiogram head/neck 09/20/2020. Noncontrast head CT 09/21/2020. Brain MRI 09/22/2020. FINDINGS: Brain: Cerebral volume is normal. A moderate to large acute/early subacute cortical and subcortical infarct within the left parietal lobe, posterosuperior left temporal lobe and posterior left insula has not significantly changed in extent as compared to the brain MRI performed one day prior. Subtle petechial hemorrhage is questioned, but there is no frank hemorrhagic conversion. Regional mass effect. No midline shift A small subacute infarct within the right corona radiata was better appreciated on the prior MRI. Redemonstrated chronic lacunar infarcts within the bilateral cerebral white matter, left basal ganglia and right thalamus. No extra-axial fluid collection. No evidence of intracranial mass. No midline shift. Vascular: Atherosclerotic calcifications. Skull: Normal. Negative for fracture or focal lesion. Sinuses/Orbits: Visualized orbits show no acute finding. Mild paranasal sinus mucosal thickening, most notably ethmoidal. Other: There are acute, comminuted and displaced bilateral nasal bone fractures, new as compared to prior examinations. Partial opacification of the right nasal passage. Suspected additional minimally displaced acute fractures of the bony nasal septum (for instance as seen on series 4, images 16 and 17). IMPRESSION: A moderate to large acute/early subacute cortical and subcortical infarct within the left MCA vascular territory has not  significantly changed in extent as compared to the brain MRI performed one day prior. There is regional mass effect but no midline shift. Subtle petechial hemorrhage is questioned, but there is no frank hemorrhagic conversion. A small subacute infarct within the right corona radiata was better appreciated on the prior MRI. Redemonstrated chronic lacunar infarcts within the bilateral cerebral white matter, left basal ganglia and right thalamus. Acute, comminuted and displaced bilateral nasal bone fractures which are new as compared to the prior examinations. Suspected minimally displaced acute fractures of the bony nasal septum. Partial opacification of the right nasal passage. Consider dedicated maxillofacial CT for further evaluation. Electronically Signed   By: Jackey Loge DO   On: 09/23/2020 19:23   DG Chest Portable 1 View  Result Date: 09/23/2020 CLINICAL DATA:  Chest pain EXAM: PORTABLE CHEST 1 VIEW COMPARISON:  September 21, 2020 FINDINGS: The heart size and mediastinal contours are within normal limits. Both lungs are clear. The visualized skeletal structures are unremarkable. IMPRESSION: No active disease. Electronically Signed   By: Jonna Clark M.D.   On: 09/23/2020 16:49   CT Angio Chest/Abd/Pel for Dissection W and/or Wo Contrast  Result Date: 09/23/2020 CLINICAL DATA:  Abdominal pain, question aortic dissection EXAM: CT ANGIOGRAPHY CHEST, ABDOMEN AND PELVIS TECHNIQUE: Non-contrast CT of the chest was initially obtained. Multidetector CT imaging through the chest, abdomen and pelvis was performed using the standard protocol during bolus administration of intravenous contrast. Multiplanar reconstructed images and MIPs were obtained and reviewed to evaluate the vascular anatomy. CONTRAST:  OMNIPAQUE  IOHEXOL 350 MG/ML SOLN COMPARISON:  None. FINDINGS: CTA CHEST FINDINGS Cardiovascular: --Heart: The heart size is normal.  There is nopericardial effusion. --Aorta: The course and caliber of the  thoracic aorta are normal. There is no aortic atherosclerotic calcification. Precontrast images show no aortic intramural hematoma. There is no blood pool, dissection or penetrating ulcer demonstrated on arterial phase postcontrast imaging. There is a origin of the left carotid artery the brachiocephalic trunk. Minimal calcifications seen at the origin of brachiocephalic trunk. The proximal arch vessels are widely patent. --Pulmonary Arteries: Contrast timing is optimized for preferential opacification of the aorta. Within that limitation, normal central pulmonary arteries. Mediastinum/Nodes: No mediastinal, hilar or axillary lymphadenopathy. The visualized thyroid and thoracic esophageal course are unremarkable. Lungs/Pleura: Mild ground-glass opacity seen at the posterior right lung base. There is a calcified granuloma seen in the right middle lobe. No focal airspace consolidation. No focal pleural abnormality. Musculoskeletal: No chest wall abnormality. No acute osseous findings. Review of the MIP images confirms the above findings. CTA ABDOMEN AND PELVIS FINDINGS VASCULAR Aorta: Normal caliber aorta without aneurysm, dissection, vasculitis or hemodynamically significant stenosis. There is scattered mild aortic atherosclerosis at the aorta bi-iliac bifurcation. Celiac: No aneurysm, dissection or hemodynamically significant stenosis. Normal branching pattern SMA: Widely patent without dissection or stenosis. Renals: Single renal arteries bilaterally. No aneurysm, dissection, stenosis or evidence of fibromuscular dysplasia. IMA: Patent without abnormality. Inflow: Scattered atherosclerosis at the common iliac vasculature. Veins: Normal course and caliber of the major veins. Assessment is otherwise limited by the arterial dominant contrast phase. Review of the MIP images confirms the above findings. NON-VASCULAR Hepatobiliary: Normal hepatic contours and density. No visible biliary dilatation. The patient is status  post cholecystectomy. No biliary ductal dilation. Pancreas: Normal contours without ductal dilatation. No peripancreatic fluid collection. 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 Adrenals/Urinary Tract: --Adrenal glands: Normal. --Right kidney/ureter: No hydronephrosis or perinephric stranding. No nephrolithiasis. No obstructing ureteral stones. --Left kidney/ureter: No hydronephrosis or perinephric stranding. No nephrolithiasis. No obstructing ureteral stones. --Urinary bladder: Unremarkable. Stomach/Bowel: --Stomach/Duodenum: No hiatal hernia or other gastric abnormality. Normal duodenal course and caliber. --Small bowel: No dilatation or inflammation. --Colon: No focal abnormality. --Appendix: Normal. Lymphatic:  No abdominal or pelvic lymphadenopathy. Reproductive: No free fluid in the pelvis. Musculoskeletal. No bony spinal canal stenosis or focal osseous abnormality. Other: None. Review of the MIP images confirms the above findings. IMPRESSION: No acute aortic abnormality. Mild aortic atherosclerosis.  Aortic Atherosclerosis (ICD10-I70.0). No other acute intrathoracic or pelvic pathology. Electronically Signed   By: Jonna Clark M.D.   On: 09/23/2020 19:14

## 2020-09-24 NOTE — Progress Notes (Signed)
PT Cancellation Note  Patient Details Name: Ilario Dhaliwal MRN: [id number] DOB: [birth year]   Cancelled Treatment:    Reason Eval/Treat Not Completed: Patient at procedure or test/unavailable at time of PT arrival. PT will continue to follow and evaluate when available.   Rolm Baptise, PT, DPT   Acute Rehabilitation Department Pager #: [phone]   Gaetana Michaelis 09/24/2020, 3:27 PM

## 2020-09-24 NOTE — Progress Notes (Signed)
Send note to Dr. Randol Kern:  0G86 Alejandro Perez:  s/p administration of Oxy and tylenol at 07:22, pt. still complaining of head, nose and sinus pain (broken nose).  He states Oxy and tylenol do not work.  He is requesting IV dilaudid.  I told him I would send this note to you with the request.

## 2020-09-24 NOTE — Progress Notes (Signed)
SLP Cancellation Note  Patient Details Name: Alejandro Perez MRN: [id number] DOB: 1976/08/07   Cancelled treatment:       Reason Eval/Treat Not Completed: Patient at procedure or test/unavailable (Pt working with other discipline at this time. SLP will f/u)  Yvone Neu I. Vear Clock, MS, CCC-SLP Acute Rehabilitation Services Office number [phone] Pager [phone]   Scheryl Marten 09/24/2020, 4:30 PM

## 2020-09-24 NOTE — Plan of Care (Signed)
Discussed with patient the signs and symptoms of a stroke.  Pt. Was able to demonstrate his knowledge via the "teach back" method.  Pt. States he is committed to his healing and has not intention of leaving AMA, as he did yesterday from [place].  His goals include: controlling pain, improving speech and movement.  Pt. Is currently resting comfortable with bed alarm on and call bell in reach.

## 2020-09-24 NOTE — Evaluation (Signed)
Physical Therapy Evaluation Patient Details Name: Alejandro Perez MRN: [id number] DOB: Feb 04, 1976 Today's Date: 09/24/2020   History of Present Illness  The pt is a 44 yo male presenting with weakness, SOB, and slurred speech in addition to chest, head, and throat pain. Pt at [facility] yesterday for stroke, left AMA as he was unsatisfied with his care. MRI from [place] showed moderate to large subacute cortical and subcortical infarct within the left MCA vascular territory, subacute infarct in the right corona radiata, lacunar infarcts.   Clinical Impression  Pt in bed upon arrival of PT, agreeable to evaluation at this time. Prior to admission the pt was independent with mobility and ADLs without use of AD. The pt now presents with limitations in functional mobility, strength, activity tolerance, and dynamic stability due to above dx, and will continue to benefit from skilled PT to address these deficits. The pt also inconsistently presents with deficits in coordination and strength through session, he was able to move functionally when engaged in conversation, but had difficulty with movements when asked to complete directly. The pt was able to complete initial bed mobility, OOB transfers, and gait with HHA of 2 for pt comfort. He had no LOB, and was able to stand without physical assist to rise or lower safely. The pt will continue to benefit from skilled PT to progress gait and to continue assessment of functional strength and coordination.      Follow Up Recommendations Supervision for mobility/OOB;Outpatient PT (vs no PT)    Equipment Recommendations  None recommended by PT    Recommendations for Other Services       Precautions / Restrictions Precautions Precautions: Fall Restrictions Weight Bearing Restrictions: No      Mobility  Bed Mobility Overal bed mobility: Modified Independent             General bed mobility comments: increased time and cues, but pt able to  come to sitting without assist and return to supine without any physical assist. The pt did ask for assist to move BLE out of bed, but was then able to raise and reposition back into bed without assist.     Transfers Overall transfer level: Needs assistance Equipment used: 2 person hand held assist Transfers: Sit to/from Stand Sit to Stand: Min assist;+2 safety/equipment         General transfer comment: minA of 2 for satey in stance but pt able to power up without physical assist. Able to stand for ~5 min, then sudden buckling bilateral LE, but pt able to correct and maintain standing with miNA  Ambulation/Gait Ambulation/Gait assistance: Min assist;+2 safety/equipment Gait Distance (Feet): 3 Feet Assistive device: 2 person hand held assist Gait Pattern/deviations: Step-to pattern;Decreased stride length     General Gait Details: BUE support for pt comfort, able to take multiple marching steps and lateral steps along EOB  Stairs            Wheelchair Mobility    Modified Rankin (Stroke Patients Only) Modified Rankin (Stroke Patients Only) Pre-Morbid Rankin Score: No symptoms Modified Rankin: Moderately severe disability     Balance Overall balance assessment: Needs assistance Sitting-balance support: Single extremity supported;Feet supported Sitting balance-Leahy Scale: Poor     Standing balance support: Bilateral upper extremity supported;During functional activity Standing balance-Leahy Scale: Poor                               Pertinent Vitals/Pain Pain Assessment:  Faces Faces Pain Scale: Hurts even more Pain Location: head, stomach, arm Pain Descriptors / Indicators: Discomfort;Grimacing Pain Intervention(s): Limited activity within patient's tolerance;Monitored during session;RN gave pain meds during session    Home Living Family/patient expects to be discharged to:: Private residence Living Arrangements: Spouse/significant other                Additional Comments: pt unable to give full details on home set up at this time, increasing frustration as he was having difficutly with word finding     Prior Function Level of Independence: Independent         Comments: pt reports independence with all mobility, able to "walk to stores" and complete all ADLs without use of AD     Hand Dominance   Dominant Hand: Right    Extremity/Trunk Assessment   Upper Extremity Assessment Upper Extremity Assessment: Defer to OT evaluation;RUE deficits/detail (equal grip strength, able to use functionally) RUE Deficits / Details: pt with moments of poor coordination and dysmetria, for example, pt able to don L sock with use of BUE without issue, but pt unable to grip or don sock on RLE. pt reaching under his R LE repeatedly stating "my arm just keeps doing that"    Lower Extremity Assessment Lower Extremity Assessment: Overall WFL for tasks assessed    Cervical / Trunk Assessment Cervical / Trunk Assessment: Normal  Communication   Communication: Expressive difficulties  Cognition Arousal/Alertness: Awake/alert Behavior During Therapy: Restless Overall Cognitive Status: Impaired/Different from baseline Area of Impairment: Safety/judgement;Problem solving                         Safety/Judgement: Decreased awareness of safety;Decreased awareness of deficits   Problem Solving: Difficulty sequencing;Requires verbal cues General Comments: Pt awake, restless, easily irritable when discussing pain/hospitalization situation. Pt with expressive difficulties when asked about home situation, but was able to participate in conversation without difficulty through remainder of session.       General Comments General comments (skin integrity, edema, etc.): VSS on RA, pt with anxiety about falling. inconsistent deficits in strength through session    Exercises     Assessment/Plan    PT Assessment Patient needs continued PT  services  PT Problem List Decreased strength;Decreased balance;Decreased mobility;Decreased coordination;Decreased cognition;Decreased safety awareness       PT Treatment Interventions DME instruction;Gait training;Stair training;Functional mobility training;Therapeutic activities;Therapeutic exercise;Balance training;Neuromuscular re-education;Patient/family education    PT Goals (Current goals can be found in the Care Plan section)  Acute Rehab PT Goals Patient Stated Goal: reduce pain PT Goal Formulation: With patient Time For Goal Achievement: 10/08/20 Potential to Achieve Goals: Fair    Frequency Min 3X/week    AM-PAC PT "6 Clicks" Mobility  Outcome Measure Help needed turning from your back to your side while in a flat bed without using bedrails?: None Help needed moving from lying on your back to sitting on the side of a flat bed without using bedrails?: A Little Help needed moving to and from a bed to a chair (including a wheelchair)?: A Little Help needed standing up from a chair using your arms (e.g., wheelchair or bedside chair)?: A Little Help needed to walk in hospital room?: A Little Help needed climbing 3-5 steps with a railing? : A Little 6 Click Score: 19    End of Session Equipment Utilized During Treatment: Gait belt Activity Tolerance: Patient tolerated treatment well;Patient limited by fatigue Patient left: in bed;with bed alarm set;with family/visitor  present Nurse Communication: Mobility status PT Visit Diagnosis: Other abnormalities of gait and mobility (R26.89);Muscle weakness (generalized) (M62.81);Pain Pain - part of body:  (head, stomach, arm)    Time: 2952-8413 PT Time Calculation (min) (ACUTE ONLY): 21 min   Charges:   PT Evaluation $PT Eval Moderate Complexity: 1 Mod          Rolm Baptise, PT, DPT   Acute Rehabilitation Department Pager #: [phone]   Gaetana Michaelis 09/24/2020, 6:06 PM

## 2020-09-24 NOTE — ED Notes (Signed)
Pt complaining of 9/10 head and chest pain. Headache pain is mostly on left temple area, states "my head feels like it is about to explode"  Chest pain is on left side of chest and is described as achy.  Provider paged, ordered pain meds, repeat 12 lead and repeat troponin level  Pt given pain medications and updated on care plan.

## 2020-09-24 NOTE — ED Notes (Signed)
Pt on call bell asking for more pain medications, states his headache is a 9/10.  Pt given PRN tylenol   Pt does not appear in distress, respirations are even and non-labored  Skin is warm, dry and intact.

## 2020-09-24 NOTE — Progress Notes (Signed)
Bilateral lower extremity venous duplex and transcranial Doppler with bubbles completed. Refer to "CV Proc" under chart review to view preliminary results.  09/24/2020 4:12 PM Eula Fried., MHA, RVT, RDCS, RDMS

## 2020-09-24 NOTE — Progress Notes (Signed)
SLP Cancellation Note  Patient Details Name: Alejandro Perez MRN: [id number] DOB: 1976-01-11   Cancelled treatment:       Reason Eval/Treat Not Completed: Other (comment). SLP was consulted for swallow evaluation on 2023-09-26; however, pt has passed the [facility] Screen on 11/4 and per protocol would not require a swallow evaluation. Pt was noted to be NPO and SLP sought clarification from RN as to whether the pt was NPO for a procedure/test or whether he subsequently demonstrated swallowing difficulty after passing the [place] swallow screen and was therefor NPO pending our evaluation. Six attempts were made today to contact the pt's RN via phone and SLP also physically searched the unit to locate the RN. SLP was able to successfully reach the RN on the seventh attempt via phone. W[place], RN reported that she did not know why the pt was NPO. Upon inquiry on whether he was NPO for a test/procedure, she stated that did not known what tests he has to have today and that he has had many. She expressed that she was in the middle of a discharge, was floated to the unit, and was unaware of the protocol for swallow screenings. She was therefore educated regarding the protocol that a diet may be initiated after pt passes the [place]. SLP will defer swallow evaluation since pt has passed the [place] and the reason for his NPO status is unknown.  Gavina Dildine I. Vear Clock, MS, CCC-SLP Acute Rehabilitation Services Office number [phone] Pager [phone]  Scheryl Marten Sep 25, 2020, 5:42 PM

## 2020-09-25 ENCOUNTER — Inpatient Hospital Stay (HOSPITAL_COMMUNITY): Payer: Medicaid - Out of State

## 2020-09-25 DIAGNOSIS — N1831 Chronic kidney disease, stage 3a: Secondary | ICD-10-CM

## 2020-09-25 LAB — CBC
HCT: 47.2 % (ref 39.0–52.0)
Hemoglobin: 17.5 g/dL — ABNORMAL HIGH (ref 13.0–17.0)
MCH: 31 pg (ref 26.0–34.0)
MCHC: 37.1 g/dL — ABNORMAL HIGH (ref 30.0–36.0)
MCV: 83.7 fL (ref 80.0–100.0)
Platelets: 350 10*3/uL (ref 150–400)
RBC: 5.64 MIL/uL (ref 4.22–5.81)
RDW: 12.4 % (ref 11.5–15.5)
WBC: 7.1 10*3/uL (ref 4.0–10.5)
nRBC: 0 % (ref 0.0–0.2)

## 2020-09-25 LAB — CARDIOLIPIN ANTIBODIES, IGG, IGM, IGA
Anticardiolipin IgA: <9 APL U/mL (ref 0–11)
Anticardiolipin IgG: <9 GPL U/mL (ref 0–14)
Anticardiolipin IgM: 16 MPL U/mL — ABNORMAL HIGH (ref 0–12)

## 2020-09-25 LAB — SODIUM, URINE, RANDOM: Sodium, Ur: 89 mmol/L

## 2020-09-25 LAB — HOMOCYSTEINE: Homocysteine: 27.9 umol/L — ABNORMAL HIGH (ref 0.0–14.5)

## 2020-09-25 LAB — LUPUS ANTICOAGULANT PANEL
DRVVT: 32.6 s (ref 0.0–47.0)
PTT Lupus Anticoagulant: 39.1 s (ref 0.0–51.9)

## 2020-09-25 LAB — BRAIN NATRIURETIC PEPTIDE: B Natriuretic Peptide: 411.2 pg/mL — ABNORMAL HIGH (ref 0.0–100.0)

## 2020-09-25 LAB — BASIC METABOLIC PANEL
Anion gap: 16 — ABNORMAL HIGH (ref 5–15)
BUN: 20 mg/dL (ref 6–20)
CO2: 22 mmol/L (ref 22–32)
Calcium: 9.3 mg/dL (ref 8.9–10.3)
Chloride: 97 mmol/L — ABNORMAL LOW (ref 98–111)
Creatinine, Ser: 1.85 mg/dL — ABNORMAL HIGH (ref 0.61–1.24)
GFR, Estimated: 45 mL/min — ABNORMAL LOW (ref >60)
Glucose, Bld: 66 mg/dL — ABNORMAL LOW (ref 70–99)
Potassium: 4.8 mmol/L (ref 3.5–5.1)
Sodium: 135 mmol/L (ref 135–145)

## 2020-09-25 LAB — RPR: RPR Ser Ql: NONREACTIVE

## 2020-09-25 IMAGING — DX DG CHEST 1V PORT
1 series · 1 of 1 positions shown · non-contrast
Comparison: [DATE] chest radiograph.

CLINICAL DATA: Dyspnea

EXAM:
PORTABLE CHEST 1 VIEW

[chest]
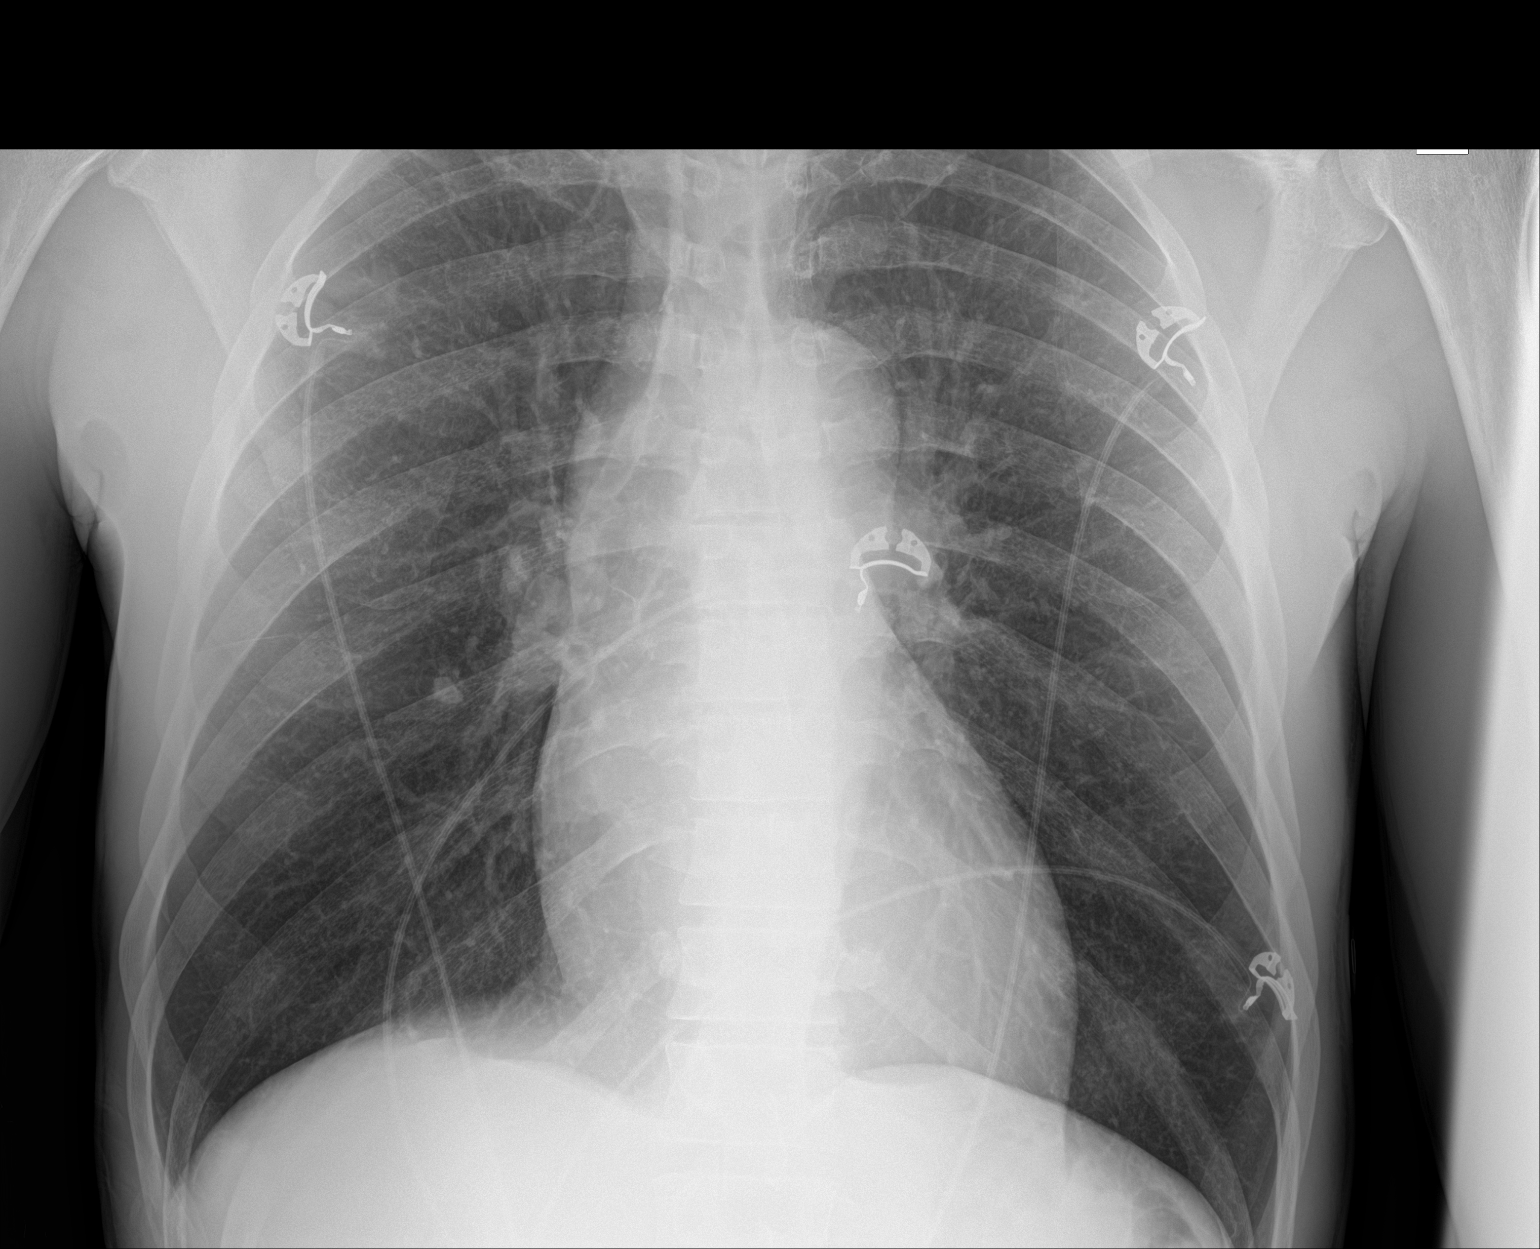

[1 of 1 positions shown; findings below may reference images not displayed]

FINDINGS: Stable cardiomediastinal silhouette with normal heart size. No
pneumothorax. No pleural effusion. Lungs appear clear, with no acute
consolidative airspace disease and no pulmonary edema.
IMPRESSION: No active disease.

## 2020-09-25 IMAGING — CT CT HEAD W/O CM
4 series · 16 of 47 positions shown, 18 images · non-contrast
Comparison: CT head [DATE]

CLINICAL DATA: Stroke.  Worsening headache.

EXAM:
CT HEAD WITHOUT CONTRAST
TECHNIQUE: Contiguous axial images were obtained from the base of the skull
through the vertex without intravenous contrast.

[Series 3: head bone · axial · 0.47mm/px · z∈[+1058,+1092]mm · 3 of 86 slices shown]
[im 9/86  bone]
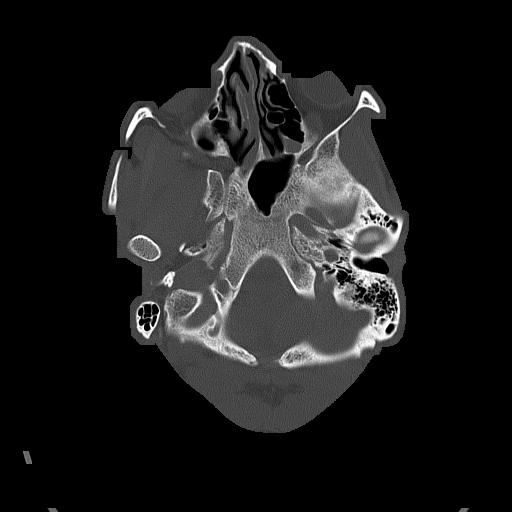
[im 18/86  bone]
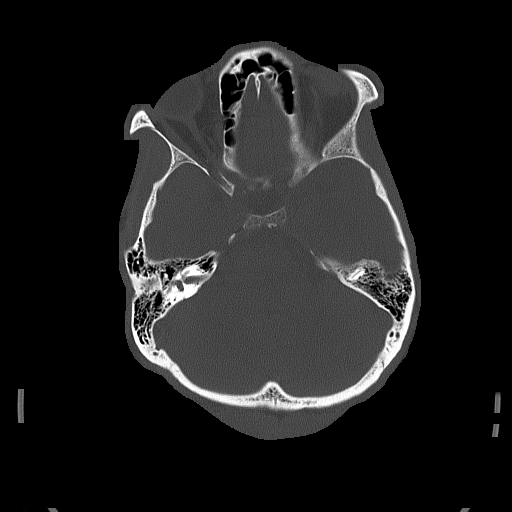
[im 26/86  bone]
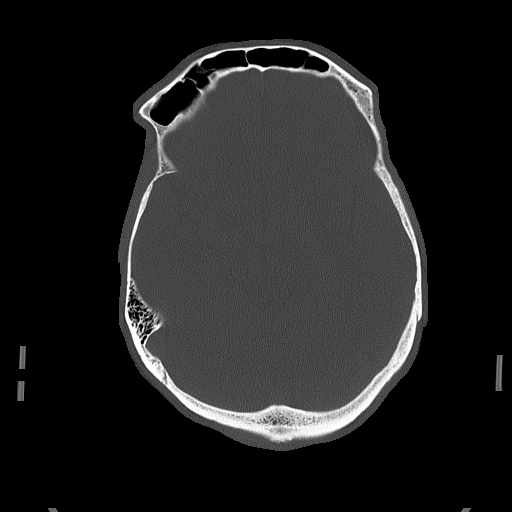

[Series 4: head without · axial · non-contrast · 0.47mm/px · z∈[+1062,+1187]mm · 7 of 35 slices shown, 9 images]
[im 5/35  brain]
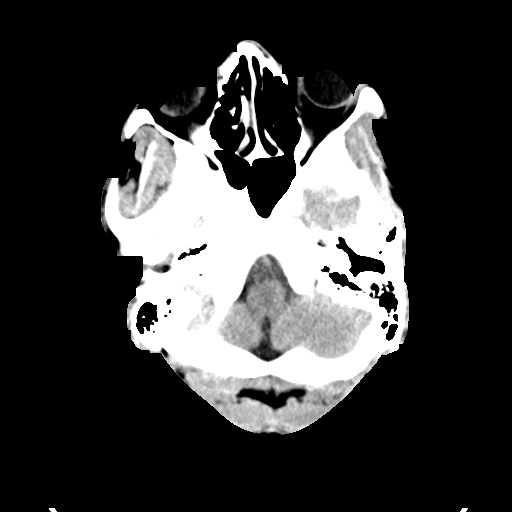
[im 5/35  bone]
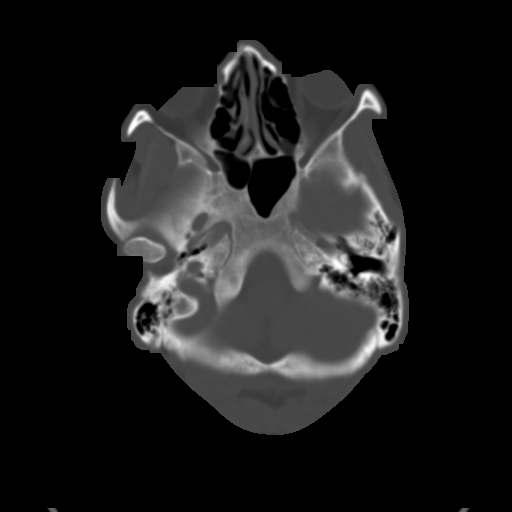
[im 9/35  brain]
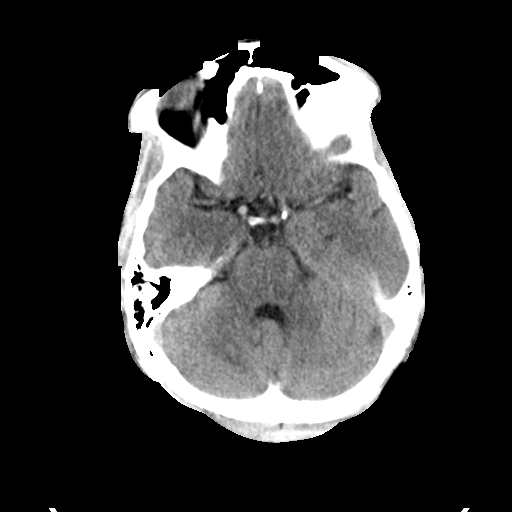
[im 13/35  brain]
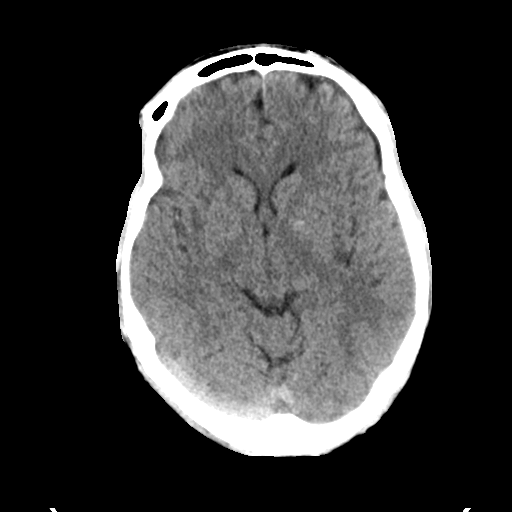
[im 18/35  brain]
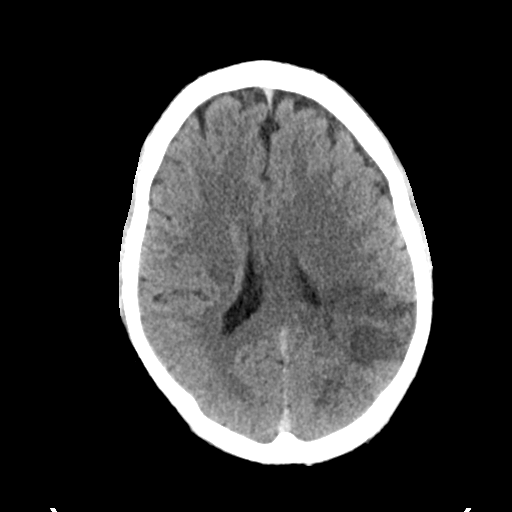
[im 22/35  brain]
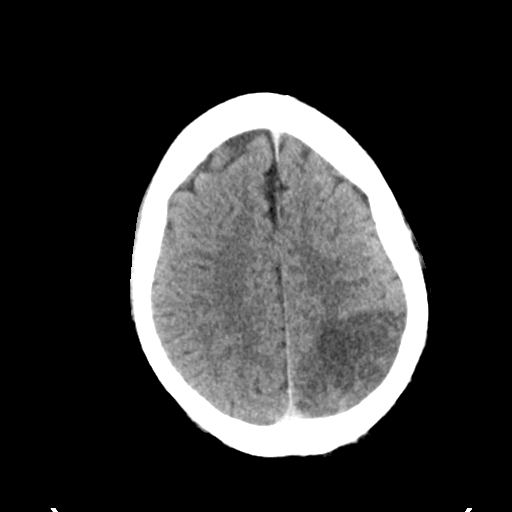
[im 22/35  bone]
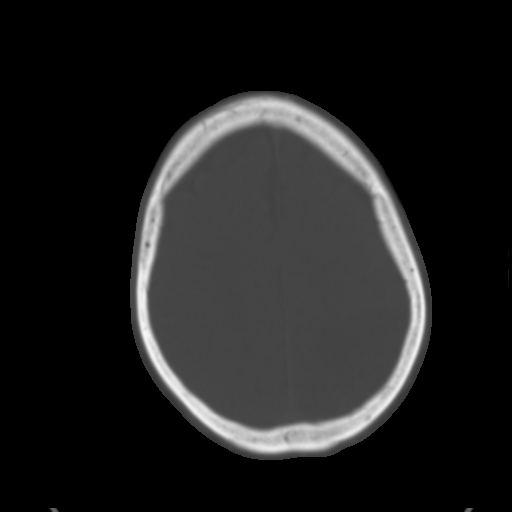
[im 26/35  brain]
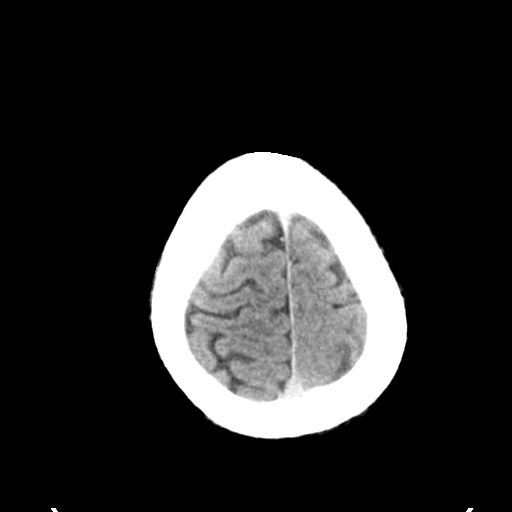
[im 30/35  brain]
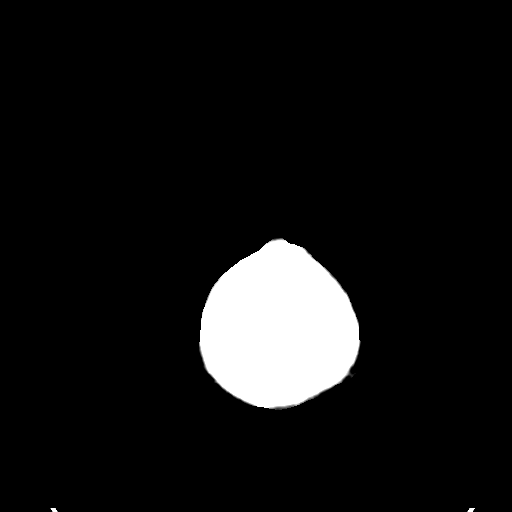

[Series 5: head without cor · coronal · non-contrast · 0.33mm/px · 3 of 74 slices shown]
[im 25/74  brain]
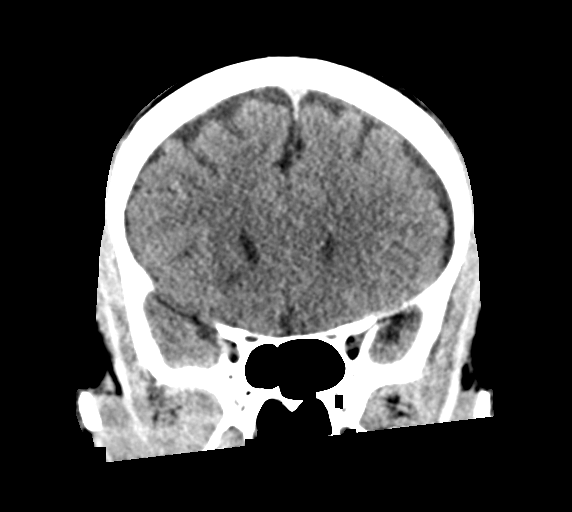
[im 33/74  brain]
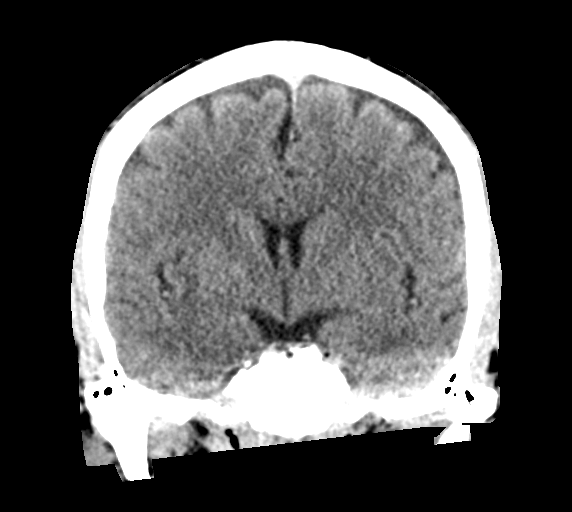
[im 41/74  brain]
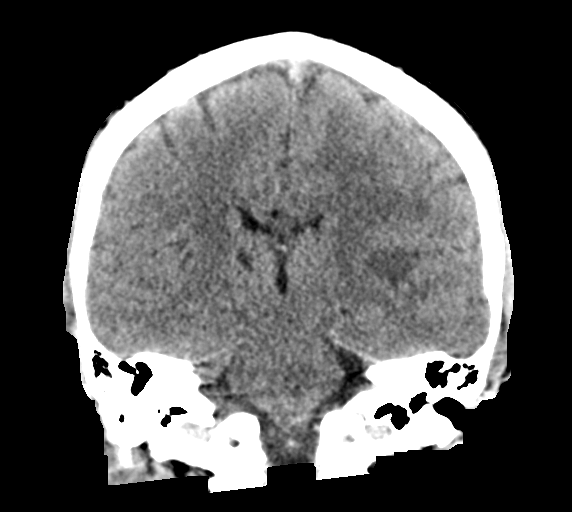

[Series 6: head without sag · sagittal · non-contrast · 0.33mm/px · 3 of 63 slices shown]
[im 21/63  brain]
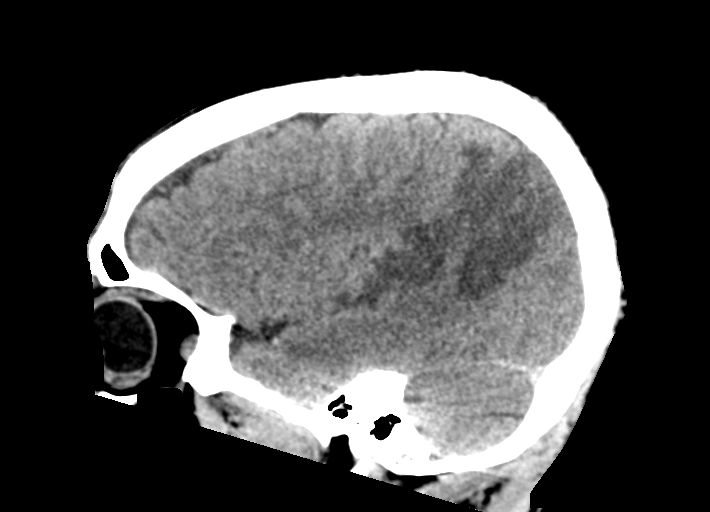
[im 32/63  brain]
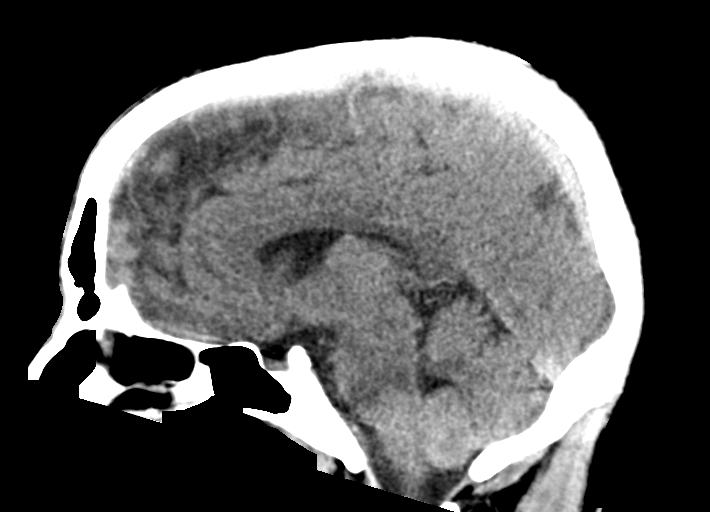
[im 42/63  brain]
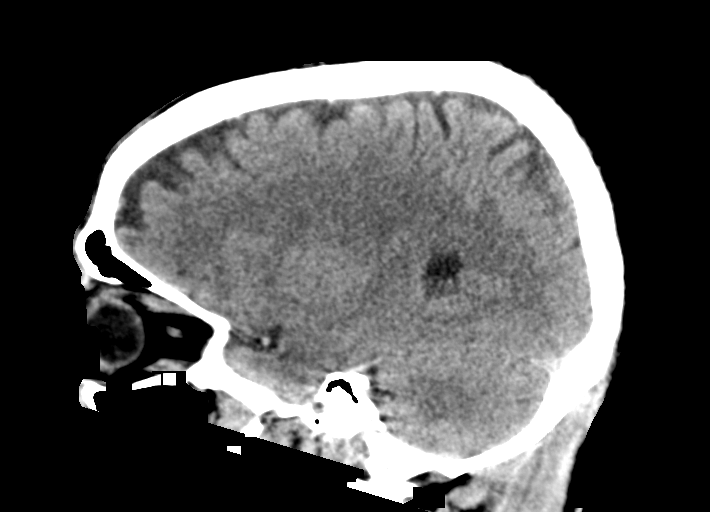

[16 of 47 positions shown; findings below may reference images not displayed]

FINDINGS: Brain: Hypodensity in the left temporoparietal lobe unchanged. No
acute hemorrhage identified. Subtle hypodensity right corona radiata
may represent a recent infarct.

Small hypodensity in the left posterior insula and external capsule
unchanged consistent with chronic infarct.

Chronic infarct right thalamus unchanged. Ventricle size normal
without midline shift. No acute hemorrhage.

Vascular: Negative for hyperdense vessel

Skull: Negative

Sinuses/Orbits: Mild mucosal edema left maxillary sinus. Negative
orbit

Other: None
IMPRESSION: Acute infarct left temporoparietal lobe unchanged. No associated
hemorrhage. No change from the prior CT.

## 2020-09-25 MED ORDER — SODIUM CHLORIDE 0.9 % IV SOLN: INTRAVENOUS | Status: DC

## 2020-09-25 MED ORDER — PANTOPRAZOLE SODIUM 40 MG PO TBEC
40.0000 mg | DELAYED_RELEASE_TABLET | Freq: Every day | ORAL | Status: DC
  Administered 2020-09-25 – 2020-09-28 (×4): 40 mg via ORAL
  Filled 2020-09-25 (×5): qty 1

## 2020-09-25 MED ORDER — DIPHENHYDRAMINE HCL 25 MG PO CAPS
50.0000 mg | ORAL_CAPSULE | Freq: Every evening | ORAL | Status: DC | PRN
  Administered 2020-09-25 – 2020-09-26 (×2): 50 mg via ORAL
  Filled 2020-09-25 (×2): qty 2

## 2020-09-25 NOTE — Progress Notes (Signed)
OT Cancellation Note  Patient Details Name: Alejandro Perez MRN: [id number] DOB: 09/17/1976   Cancelled Treatment:    Reason Eval/Treat Not Completed: Pain limiting ability to participate; pt with significant headache (already given pain meds but with no relief at this time). Request OT return at a later time. Will follow up for OT eval as able.  Marcy Siren, OT Acute Rehabilitation Services Pager [phone] Office [phone]   Alejandro Perez 09/25/2020, 2:10 PM

## 2020-09-25 NOTE — TOC CAGE-AID Note (Signed)
Transition of Care [facility]) - CAGE-AID Screening   Patient Details  Name: Alejandro Perez MRN: [id number] Date of Birth: 25-Aug-1976  Transition of Care [facility]) CM/SW Contact:    Jimmy Picket, [place] Phone Number: 09/25/2020, 1:05 PM   Clinical Narrative:  Pt stated "Im not a drug addict" and refused to complete assessment.   CAGE-AID Screening: Substance Abuse Screening unable to be completed due to: : Patient Refused            Jimmy Picket, [place] Clinical Social Worker [phone]

## 2020-09-25 NOTE — Progress Notes (Addendum)
SLP Cancellation Note  Patient Details Name: Ahmani Prehn MRN: [id number] DOB: [birth year]   Cancelled treatment:        Orders for cognitive linguistic evaluation received and appreciated.  SLP returned for formal evaluation of communication.  Pt declined at this time 2/2 10 of 10 head pain.  Pt had been given medication by RN with no relief.  Pt's communication abilities seemingly worse in conversation with SLP.  RN and MD notified.   Kerrie Pleasure, MA, CCC-SLP Acute Rehabilitation Services Office: [phone]; Pager [phone]): [phone] 09/25/2020, 3:04 PM

## 2020-09-25 NOTE — Evaluation (Signed)
Clinical/Bedside Swallow Evaluation Alejandro Perez Details  Name: Alejandro Perez MRN: [id number] Date of Birth: 14-Mar-1976  Today's Date: 09/25/2020 Time: SLP Start Time (ACUTE ONLY): 1032 SLP Stop Time (ACUTE ONLY): 1043 SLP Time Calculation (min) (ACUTE ONLY): 11 min  Past Medical History:  Past Medical History:  Diagnosis Date  . Anemia   . Chest pain   . Glaucoma   . Hypertension   . LVH (left ventricular hypertrophy)   . Spleen enlarged   . Stroke (HCC)   . Thrombocytopenia (HCC)    Past Surgical History:  Past Surgical History:  Procedure Laterality Date  . CHOLECYSTECTOMY    . SPLENECTOMY, 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   Procedure: SPLENECTOMY;  Surgeon: [name], MD;  Location: [facility] OR;  Service: General;  Laterality: N/A;   HPI:  Alejandro Perez is a 44 y.o. male with medical history significant of acquired TTP s/p splenectomy, hypertension, pulmonary hypertension, HFpEF, CKD who presented with known CVA that he was being treated for at [facility], but left AMA after he was unsatisfied with his care. Alejandro Perez states that on he woke up on 10/31 with dizziness, falling to his left, feeling weak, and speech difficulty consisting of slurred speech. MRI from [place] showed moderate to large subacute cortical and subcortical infarct within the left MCA vascular territory, subacute infarct in the right corona radiata, lacunar infarcts. CT head 11/5: Acute left MCA branch infarct. Subacute right corona radiata infarct.   Assessment / Plan / Recommendation Clinical Impression  Alejandro Perez presents with functional swallowing as assessed clinically.  There were no clinical s/s of aspiration with any consistencies trialed. Alejandro Perez exhibited good oral clearance of solids.    Recommend regular texture diet with thin liquid.      Of note: speech-language difficulties noted in conversation. Alejandro Perez c/o of difficulty getting words out.  His descriptions sound like deficits are apraxic in nature, and Alejandro Perez was noted to have  difficulty repeating, especially multisyllabic word with inconsistent errors noted.  Some paraphasias ("machine" for "wheel chair" with self correction) noted in conversation, suggesting possible aphasia as well.  Requested orders for Speech [place]ag[place] Evaluation.  SLP Visit Diagnosis: Dysphagia, unspecified (R13.10)    Aspiration Risk  No limitations    Diet Recommendation Regular;Thin liquid   Liquid Administration via: Cup;Straw Medication Administration: Whole meds with liquid Supervision: Alejandro Perez able to self feed Compensations: Small sips/bites;Slow rate Postural Changes: Seated upright at 90 degrees (as upright as possible)    Other  Recommendations Oral Care Recommendations: Oral care BID   Follow up Recommendations None      Frequency and Duration   n/a         Prognosis   n/a     Swallow Study   General HPI: Alejandro Perez is a 44 y.o. male with medical history significant of acquired TTP s/p splenectomy, hypertension, pulmonary hypertension, HFpEF, CKD who presented with known CVA that he was being treated for at [facility], but left AMA after he was unsatisfied with his care. Alejandro Perez states that on he woke up on 10/31 with dizziness, falling to his left, feeling weak, and speech difficulty consisting of slurred speech. MRI from [place] showed moderate to large subacute cortical and subcortical infarct within the left MCA vascular territory, subacute infarct in the right corona radiata, lacunar infarcts. CT head 11/5: Acute left MCA branch infarct. Subacute right corona radiata infarct. Type of Study: Bedside Swallow Evaluation Previous Swallow Assessment: none Diet Prior to this Study: NPO Temperature Spikes Noted: No Respiratory Status: Room air  History of Recent Intubation: No Behavior/Cognition: Alert;Cooperative;Pleasant mood Oral Cavity Assessment: Within Functional Limits Oral Care Completed by SLP: No Oral Cavity - Dentition: Adequate natural dentition Vision:  Functional for self-feeding Self-Feeding Abilities: Able to feed self Alejandro Perez Positioning: Upright in bed Baseline Vocal Quality: Normal Volitional Cough: Strong Volitional Swallow: Able to elicit    Oral/Motor/Sensory Function Overall Oral Motor/Sensory Function: Within functional limits Facial ROM: Within Functional Limits Facial Symmetry: Within Functional Limits Lingual ROM: Within Functional Limits Lingual Symmetry: Within Functional Limits Lingual Strength: Within Functional Limits Velum: Within Functional Limits Mandible: Within Functional Limits   Ice Chips Ice chips: Not tested   Thin Liquid Thin Liquid: Within functional limits Presentation: Straw    Nectar Thick Nectar Thick Liquid: Not tested   Honey Thick Honey Thick Liquid: Not tested   Puree Puree: Within functional limits Presentation: Spoon   Solid     Solid: Within functional limits Presentation: Self Fed      Kerrie Pleasure , MA, CCC-SLP Acute Rehabilitation Services Office: [phone]; Pager (11/6): [phone] 09/25/2020,11:00 AM

## 2020-09-25 NOTE — Progress Notes (Addendum)
PROGRESS NOTE                                                                             PROGRESS NOTE                                                                                                                                                                                                             Patient Demographics:    Alejandro Perez, is a 44 y.o. male, DOB - [birth year], OVF:[id number]  Outpatient Primary MD for the patient is Patient, No Pcp Per    LOS - 1  Admit date - 09/23/2020    Chief Complaint  Patient presents with  . Weakness       Brief Narrative   HPI: Alejandro Perez is a 44 y.o. male with medical history significant of acquired TTP status post splenectomy, hypertension, pulmonary hypertension, HFpEF, CKD presented with known CVA that he was being treated for at [facility], but left AMA after he was unsatisfied with his care.  Patient states that on Sunday he woke up with dizziness, falling to his left, feeling weak, and speech difficulty consisting of slurred speech with his daughter.  Patient had initial work-up at [facility] and had been there for several days.  MRI from [place] showed moderate to large subacute cortical and subcortical infarct within the left MCA vascular territory, subacute infarct in the right corona radiata, lacunar infarcts.  Patient states that he had a fall while there and landed on his face with his wheelchair landed on top of him but was unable to get help.  And that is why he left and came to [facility].  He was accompanied with some records but it is unclear what the results of his full work-up was.  Patient reports some chest tightness, chronic abdominal pain, headache.  He denies constipation or diarrhea  ED Course: Vital significant for hypertension in the ED here.  Labs showed creatinine of 1.5 stable for his baseline.  LFTs within normal limits.  CBC showed  hemoglobin of 17.  PT INR and  PTT within normal limits.  Initial troponin XVIII, second pending.  Respiratory viral panel pending when seen.  EKG showed normal sinus rhythm with LVH and QTC prolongation at 45.   Subjective:    [name] today complaining of dyspnea overnight, currently reported has resolved, no chest pain.     Assessment  & Plan :    Principal Problem:   CVA (cerebral vascular accident) (HCC) Active Problems:   Hypertension   CKD (chronic kidney disease)   (HFpEF) heart failure with preserved ejection fraction (HCC)    Acute CVA - L MCA infarct embolic secondary to uncontrolled risk factor with HTN, HLD and cocaine use.  -Lower extremity Dopplers with no DVT, transcranial Dopplers with no RT L shunting, 2D echo with a preserved EF 55 to 60%. -Suspicion for embolic stroke, continue with telemetry monitoring, neurology recommend outpatient cardiac event monitoring to rule out A. Fib.. -Discussed with neurology, given patient will be here till Monday, they recommend to proceed with TEE if possible.  With cardiology, they will try to arrange for Monday. -Now on aspirin and Plavix for 3 weeks, then aspirin alone.. -PT/OT/SLP consulted.  HTN - Resume home clonidine, amlodipine, metoprolol, Lasix  HFpEF - Resume home metoprolol, Lasix  AKI on CKD -Pain is worsening at 1.8 today, he did receive IV contrast, will check urine sodium, continue with IV fluids, avoid nephrotoxic medications  Hyperlipidemia -Started on statin  Head Ache/Chronic Pain  - Received headache cocktail on IV opiates in ED with minimal improvement - We will provide home opioid medication, will add low-dose IV Dilaudid given pain due to nasal fracture.  Polysubstance use > Patient has a history of marijuana and recent trial of ecstasy.  He denies cocaine but has had positive cocaine on his UDS is at other hospitalizations. - Continue to monitor  Patient did report dyspnea overnight, this has resolved, he is  saturating 100% room air, no further dyspnea, chest x-ray is clear, no leg edema, no evidence of volume overload, will continue to monitor and check BNP in a.m.,   SpO2: 100 %  Recent Labs  Lab 09/23/20 1407 09/23/20 2341 09/24/20 0238  WBC 6.7  --  6.9  PLT 315  --  301  AST 21  --   --   ALT 16  --   --   ALKPHOS 84  --   --   BILITOT 0.8  --   --   ALBUMIN 4.3  --   --   INR 1.0  --   --   SARSCOV2NAA  --  NEGATIVE  --        ABG     Component Value Date/Time   PHART 7.358 10/13/2019 0426   PCO2ART 54.3 (H) 10/13/2019 0426   PO2ART 59.3 (L) 10/13/2019 0426   HCO3 29.8 (H) 10/13/2019 0426   O2SAT 88.2 10/13/2019 0426       Condition - Extremely Guarded  Family Communication  :  Wife at bedside  Code Status :  Full  Consults  :  Neurology  Procedures  :  none  Disposition Plan  :    Status is: inpatient  The patient will require care spanning > 2 midnights and should be moved to inpatient because: Hemodynamically unstable and Ongoing diagnostic testing needed not appropriate for outpatient work up  Dispo: The patient is from: Home              Anticipated  d/c is to: Home              Anticipated d/c date is: 2 days              Patient currently is not medically stable to d/c.  With worsening creatinine      DVT Prophylaxis  :  Lovenox   Lab Results  Component Value Date   PLT 301 09/24/2020    Diet :  Diet Order            Diet Heart Room service appropriate? Yes; Fluid consistency: Thin  Diet effective now                  Inpatient Medications  Scheduled Meds: . amLODipine  10 mg Oral Daily  . aspirin EC  81 mg Oral Daily  . atorvastatin  40 mg Oral Daily  . cloNIDine  0.2 mg Oral BID  . clopidogrel  75 mg Oral Daily  . enoxaparin (LOVENOX) injection  40 mg Subcutaneous Q24H  . folic acid  1 mg Oral Daily  . metoprolol tartrate  25 mg Oral BID  . sodium chloride flush  3 mL Intravenous Once  . sodium chloride flush  3 mL  Intravenous Q12H  . cyanocobalamin  1,000 mcg Oral Daily   Continuous Infusions: . lactated ringers 100 mL/hr at 09/25/20 0424   PRN Meds:.acetaminophen **OR** acetaminophen, albuterol, HYDROmorphone (DILAUDID) injection, oxyCODONE, polyethylene glycol  Antibiotics  :    Anti-infectives (From admission, onward)   None      Huey Bienenstockawood Reese Stockman M.D on 09/25/2020 at 11:58 AM  To page go to [email or web address]   [facility] -  Office  [phone]     Objective:   Vitals:   09/25/20 0600 09/25/20 0618 09/25/20 0750 09/25/20 1125  BP:   (!) 166/85 (!) 148/83  Pulse:   (!) 51 (!) 53  Resp: 14 17 18 18   Temp:   98.2 F (36.8 C) 98 F (36.7 C)  TempSrc:      SpO2:   99% 100%  Weight:      Height:        Wt Readings from Last 3 Encounters:  09/25/20 73.2 kg  10/09/19 73.5 kg  12/21/17 71.2 kg     Intake/Output Summary (Last 24 hours) at 09/25/2020 1158 Last data filed at 09/25/2020 0600 Gross per 24 hour  Intake 2888.63 ml  Output 1075 ml  Net 1813.63 ml     Physical Exam  Awake Alert, Oriented X 3, patient with poor vision (he reports he is legally blind), he remains with significant speech difficulties. Symmetrical Chest wall movement, Good air movement bilaterally, CTAB RRR,No Gallops,Rubs or new Murmurs, No Parasternal Heave +ve B.Sounds, Abd Soft, No tenderness, No rebound - guarding or rigidity. No Cyanosis, Clubbing or edema, No new Rash or bruise       Data Review:    CBC Recent Labs  Lab 09/23/20 1407 09/24/20 0238  WBC 6.7 6.9  HGB 17.9* 16.3  HCT 49.6 45.0  PLT 315 301  MCV 86.6 88.2  MCH 31.2 32.0  MCHC 36.1* 36.2*  RDW 13.0 12.9  LYMPHSABS 1.3  --   MONOABS 0.9  --   EOSABS 0.5  --   BASOSABS 0.1  --     Recent Labs  Lab 09/23/20 1407 09/24/20 0238 09/24/20 1011 09/25/20 0226  NA 138 136  --  135  K 4.2 4.1  --  4.8  CL 100 101  --  97*  CO2 25 22  --  22  GLUCOSE 101* 95  --  66*  BUN 11 12  --  20  CREATININE 1.54*  1.61*  --  1.85*  CALCIUM 9.8 9.2  --  9.3  AST 21  --   --   --   ALT 16  --   --   --   ALKPHOS 84  --   --   --   BILITOT 0.8  --   --   --   ALBUMIN 4.3  --   --   --   INR 1.0  --   --   --   TSH  --   --  1.938  --   HGBA1C  --  4.8  --   --     ------------------------------------------------------------------------------------------------------------------ Recent Labs    09/24/20 0238  CHOL 186  HDL 44  LDLCALC 130*  TRIG 62  CHOLHDL 4.2    Lab Results  Component Value Date   HGBA1C 4.8 09/24/2020   ------------------------------------------------------------------------------------------------------------------ Recent Labs    09/24/20 1011  TSH 1.938    Cardiac Enzymes No results for input(s): CKMB, TROPONINI, MYOGLOBIN in the last 168 hours.  Invalid input(s): CK ------------------------------------------------------------------------------------------------------------------    Component Value Date/Time   BNP 516.0 (H) 10/14/2019 1008    Micro Results Recent Results (from the past 240 hour(s))  Respiratory Panel by RT PCR (Flu A&B, Covid) - Nasopharyngeal Swab     Status: None   Collection Time: 09/23/20 11:41 PM   Specimen: Nasopharyngeal Swab  Result Value Ref Range Status   SARS Coronavirus 2 by RT PCR NEGATIVE NEGATIVE Final    Comment: (NOTE) SARS-CoV-2 target nucleic acids are NOT DETECTED.  The SARS-CoV-2 RNA is generally detectable in upper respiratoy specimens during the acute phase of infection. The lowest concentration of SARS-CoV-2 viral copies this assay can detect is 131 copies/mL. A negative result does not preclude SARS-Cov-2 infection and should not be used as the sole basis for treatment or other patient management decisions. A negative result may occur with  improper specimen collection/handling, submission of specimen other than nasopharyngeal swab, presence of viral mutation(s) within the areas targeted by this assay, and  inadequate number of viral copies (<131 copies/mL). A negative result must be combined with clinical observations, patient history, and epidemiological information. The expected result is Negative.  Fact Sheet for Patients:  https://www.moore.com/  Fact Sheet for Healthcare Providers:  https://www.young.biz/  This test is no t yet approved or cleared by the [place] FDA and  has been authorized for detection and/or diagnosis of SARS-CoV-2 by FDA under an Emergency Use Authorization (EUA). This EUA will remain  in effect (meaning this test can be used) for the duration of the COVID-19 declaration under Section 564(b)(1) of the Act, 21 U.S.C. section 360bbb-3(b)(1), unless the authorization is terminated or revoked sooner.     Influenza A by PCR NEGATIVE NEGATIVE Final   Influenza B by PCR NEGATIVE NEGATIVE Final    Comment: (NOTE) The Xpert Xpress SARS-CoV-2/FLU/RSV assay is intended as an aid in  the diagnosis of influenza from Nasopharyngeal swab specimens and  should not be used as a sole basis for treatment. Nasal washings and  aspirates are unacceptable for Xpert Xpress SARS-CoV-2/FLU/RSV  testing.  Fact Sheet for Patients: https://www.moore.com/  Fact Sheet for Healthcare Providers: https://www.young.biz/  This test is not yet approved or cleared by the [place] FDA and  has been authorized for detection and/or  diagnosis of SARS-CoV-2 by  FDA under an Emergency Use Authorization (EUA). This EUA will remain  in effect (meaning this test can be used) for the duration of the  Covid-19 declaration under Section 564(b)(1) of the Act, 21  U.S.C. section 360bbb-3(b)(1), unless the authorization is  terminated or revoked. Performed at [facility], [address], [place], [place] [address]     Radiology Reports CT HEAD WO CONTRAST  Result Date: 09/24/2020 CLINICAL DATA:  Severe  headache.  History of stroke. EXAM: CT HEAD WITHOUT CONTRAST TECHNIQUE: Contiguous axial images were obtained from the base of the skull through the vertex without intravenous contrast. COMPARISON:  Yesterday FINDINGS: Brain: More well-defined cytotoxic edema in the left frontal parietal convexity, expected. Subacute infarct at the right corona radiata is unchanged. Chronic lacunar infarct at the right thalamus and left corona radiata. No acute hemorrhage, hydrocephalus, or significant mass effect. Vascular: No hyperdense vessel or unexpected calcification. Skull: Remote nasal bone fractures. Sinuses/Orbits: No acute finding. IMPRESSION: 1. No new abnormality. 2. Acute left MCA branch infarct. 3. Subacute right corona radiata infarct. 4. Chronic lacunar infarcts. Electronically Signed   By: Marnee Spring M.D.   On: 09/24/2020 06:07   CT HEAD WO CONTRAST  Result Date: 09/23/2020 CLINICAL DATA:  Neuro deficit, acute stroke suspected. EXAM: CT HEAD WITHOUT CONTRAST TECHNIQUE: Contiguous axial images were obtained from the base of the skull through the vertex without intravenous contrast. COMPARISON:  Noncontrast head CT 09/20/2020. CT angiogram head/neck 09/20/2020. Noncontrast head CT 09/21/2020. Brain MRI 09/22/2020. FINDINGS: Brain: Cerebral volume is normal. A moderate to large acute/early subacute cortical and subcortical infarct within the left parietal lobe, posterosuperior left temporal lobe and posterior left insula has not significantly changed in extent as compared to the brain MRI performed one day prior. Subtle petechial hemorrhage is questioned, but there is no frank hemorrhagic conversion. Regional mass effect. No midline shift A small subacute infarct within the right corona radiata was better appreciated on the prior MRI. Redemonstrated chronic lacunar infarcts within the bilateral cerebral white matter, left basal ganglia and right thalamus. No extra-axial fluid collection. No evidence of  intracranial mass. No midline shift. Vascular: Atherosclerotic calcifications. Skull: Normal. Negative for fracture or focal lesion. Sinuses/Orbits: Visualized orbits show no acute finding. Mild paranasal sinus mucosal thickening, most notably ethmoidal. Other: There are acute, comminuted and displaced bilateral nasal bone fractures, new as compared to prior examinations. Partial opacification of the right nasal passage. Suspected additional minimally displaced acute fractures of the bony nasal septum (for instance as seen on series 4, images 16 and 17). IMPRESSION: A moderate to large acute/early subacute cortical and subcortical infarct within the left MCA vascular territory has not significantly changed in extent as compared to the brain MRI performed one day prior. There is regional mass effect but no midline shift. Subtle petechial hemorrhage is questioned, but there is no frank hemorrhagic conversion. A small subacute infarct within the right corona radiata was better appreciated on the prior MRI. Redemonstrated chronic lacunar infarcts within the bilateral cerebral white matter, left basal ganglia and right thalamus. Acute, comminuted and displaced bilateral nasal bone fractures which are new as compared to the prior examinations. Suspected minimally displaced acute fractures of the bony nasal septum. Partial opacification of the right nasal passage. Consider dedicated maxillofacial CT for further evaluation. Electronically Signed   By: Jackey Loge DO   On: 09/23/2020 19:23   DG Chest Port 1 View  Result Date: 09/25/2020 CLINICAL DATA:  Dyspnea EXAM: PORTABLE CHEST 1 VIEW COMPARISON:  09/23/2020 chest radiograph. FINDINGS: Stable cardiomediastinal silhouette with normal heart size. No pneumothorax. No pleural effusion. Lungs appear clear, with no acute consolidative airspace disease and no pulmonary edema. IMPRESSION: No active disease. Electronically Signed   By: Delbert Phenix M.D.   On: 09/25/2020 07:59    DG Chest Portable 1 View  Result Date: 09/23/2020 CLINICAL DATA:  Chest pain EXAM: PORTABLE CHEST 1 VIEW COMPARISON:  September 21, 2020 FINDINGS: The heart size and mediastinal contours are within normal limits. Both lungs are clear. The visualized skeletal structures are unremarkable. IMPRESSION: No active disease. Electronically Signed   By: Jonna Clark M.D.   On: 09/23/2020 16:49   VAS [place] TRANSCRANIAL DOPPLER W BUBBLES  Result Date: 09/24/2020  Transcranial Doppler with Bubble Indications: Stroke. Comparison Study: No prior study Performing Technologist: Gertie Fey MHA, RDMS, RVT, RDCS  Examination Guidelines: A complete evaluation includes B-mode imaging, spectral Doppler, color Doppler, and power Doppler as needed of all accessible portions of each vessel. Bilateral testing is considered an integral part of a complete examination. Limited examinations for reoccurring indications may be performed as noted.  Summary: No HITS at rest or during Valsalva. Negative transcranial Doppler Bubble study with no evidence of right to left intracardiac communication.  A vascular evaluation was performed. The right middle cerebral artery was studied. An IV was inserted into the patient's right forearm. Verbal informed consent was obtained.  *See table(s) above for TCD measurements and observations.    Preliminary    ECHOCARDIOGRAM COMPLETE  Result Date: 09/24/2020    ECHOCARDIOGRAM REPORT   Patient Name:   DANIL WEDGE Date of Exam: 09/24/2020 Medical Rec #:  [id number]    Height:       74.0 in Accession #:    [id number]   Weight:       159.3 lb Date of Birth:  1976/11/11   BSA:          1.973 m Patient Age:    44 years     BP:           155/87 mmHg Patient Gender: M            HR:           53 bpm. Exam Location:  Inpatient Procedure: 2D Echo, Color Doppler, Cardiac Doppler and Strain Analysis Indications:    Stroke i163.9  History:        Patient has prior history of Echocardiogram examinations, most                  recent 12/21/2017. Risk Factors:Hypertension.  Sonographer:    Irving Burton Senior RDCS Referring Phys: [id number] Scheryl Marten XU IMPRESSIONS  1. Left ventricular ejection fraction, by estimation, is 55 to 60%. The left ventricle has normal function. The left ventricle has no regional wall motion abnormalities. There is severe left ventricular hypertrophy. Left ventricular diastolic parameters  are indeterminate.  2. Right ventricular systolic function is normal. The right ventricular size is normal.  3. Left atrial size was mild to moderately dilated.  4. Mild mitral valve regurgitation.  5. 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 Aortic valve regurgitation is mild to moderate. Mild aortic valve sclerosis is present, with no evidence of aortic valve stenosis.  6. The inferior vena cava is normal in size with greater than 50% respiratory variability, suggesting right atrial pressure of 3 mmHg. FINDINGS  Left Ventricle: Left ventricular ejection fraction,  by estimation, is 55 to 60%. The left ventricle has normal function. The left ventricle has no regional wall motion abnormalities. The left ventricular internal cavity size was normal in size. There is  severe left ventricular hypertrophy. Left ventricular diastolic parameters are indeterminate. Right Ventricle: The right ventricular size is normal. Right vetricular wall thickness was not assessed. Right ventricular systolic function is normal. Left Atrium: Left atrial size was mild to moderately dilated. Right Atrium: Right atrial size was normal in size. Pericardium: There is no evidence of pericardial effusion. Mitral Valve: The mitral valve is abnormal. There is mild thickening of the mitral valve leaflet(s). Mild mitral annular calcification. Mild mitral valve regurgitation. Tricuspid Valve: The tricuspid valve is normal in structure. Tricuspid valve regurgitation is trivial. Aortic Valve: Aortic insufficiency  is eccentric, directed posterior into LV. The aortic valve is abnormal. Aortic valve regurgitation is mild to moderate. Aortic regurgitation PHT measures 897 msec. Mild aortic valve sclerosis is present, with no evidence of aortic valve stenosis. Pulmonic Valve: The pulmonic valve was grossly normal. Pulmonic valve regurgitation is not visualized. Aorta: The aortic root and ascending aorta are structurally normal, with no evidence of dilitation. Venous: The inferior vena cava is normal in size with greater than 50% respiratory variability, suggesting right atrial pressure of 3 mmHg. IAS/Shunts: No atrial level shunt detected by color flow Doppler.  LEFT VENTRICLE PLAX 2D LVIDd:         4.30 cm  Diastology LVIDs:         2.90 cm  LV e' medial:    5.22 cm/s LV PW:         1.50 cm  LV E/e' medial:  11.9 LV IVS:        1.80 cm  LV e' lateral:   5.11 cm/s LVOT diam:     2.10 cm  LV E/e' lateral: 12.2 LV SV:         87 LV SV Index:   44 LVOT Area:     3.46 cm  RIGHT VENTRICLE RV S prime:     11.00 cm/s TAPSE (M-mode): 2.3 cm LEFT ATRIUM              Index       RIGHT ATRIUM           Index LA diam:        4.50 cm  2.28 cm/m  RA Area:     14.10 cm LA Vol (A2C):   112.0 ml 56.76 ml/m RA Volume:   29.70 ml  15.05 ml/m LA Vol (A4C):   76.1 ml  38.57 ml/m LA Biplane Vol: 92.1 ml  46.68 ml/m  AORTIC VALVE LVOT Vmax:   111.00 cm/s LVOT Vmean:  71.700 cm/s LVOT VTI:    0.252 m AI PHT:      897 msec  AORTA Ao Root diam: 3.70 cm Ao Asc diam:  3.50 cm MITRAL VALVE MV Area (PHT): 2.62 cm    SHUNTS MV Decel Time: 290 msec    Systemic VTI:  0.25 m MV E velocity: 62.20 cm/s  Systemic Diam: 2.10 cm MV A velocity: 48.00 cm/s MV E/A ratio:  1.30 Dietrich Pates MD Electronically signed by Dietrich Pates MD Signature Date/Time: 09/24/2020/3:44:05 PM    Final    CT Angio Chest/Abd/Pel for Dissection W and/or Wo Contrast  Result Date: 09/23/2020 CLINICAL DATA:  Abdominal pain, question aortic dissection EXAM: CT ANGIOGRAPHY CHEST, ABDOMEN  AND PELVIS TECHNIQUE: Non-contrast CT of the chest was initially  obtained. Multidetector CT imaging through the chest, abdomen and pelvis was performed using the standard protocol during bolus administration of intravenous contrast. Multiplanar reconstructed images and MIPs were obtained and reviewed to evaluate the vascular anatomy. CONTRAST:  OMNIPAQUE IOHEXOL 350 MG/ML SOLN COMPARISON:  None. FINDINGS: CTA CHEST FINDINGS Cardiovascular: --Heart: The heart size is normal.  There is nopericardial effusion. --Aorta: The course and caliber of the thoracic aorta are normal. There is no aortic atherosclerotic calcification. Precontrast images show no aortic intramural hematoma. There is no blood pool, dissection or penetrating ulcer demonstrated on arterial phase postcontrast imaging. There is a origin of the left carotid artery the brachiocephalic trunk. Minimal calcifications seen at the origin of brachiocephalic trunk. The proximal arch vessels are widely patent. --Pulmonary Arteries: Contrast timing is optimized for preferential opacification of the aorta. Within that limitation, normal central pulmonary arteries. Mediastinum/Nodes: No mediastinal, hilar or axillary lymphadenopathy. The visualized thyroid and thoracic esophageal course are unremarkable. Lungs/Pleura: Mild ground-glass opacity seen at the posterior right lung base. There is a calcified granuloma seen in the right middle lobe. No focal airspace consolidation. No focal pleural abnormality. Musculoskeletal: No chest wall abnormality. No acute osseous findings. Review of the MIP images confirms the above findings. CTA ABDOMEN AND PELVIS FINDINGS VASCULAR Aorta: Normal caliber aorta without aneurysm, dissection, vasculitis or hemodynamically significant stenosis. There is scattered mild aortic atherosclerosis at the aorta bi-iliac bifurcation. Celiac: No aneurysm, dissection or hemodynamically significant stenosis. Normal branching pattern SMA:  Widely patent without dissection or stenosis. Renals: Single renal arteries bilaterally. No aneurysm, dissection, stenosis or evidence of fibromuscular dysplasia. IMA: Patent without abnormality. Inflow: Scattered atherosclerosis at the common iliac vasculature. Veins: Normal course and caliber of the major veins. Assessment is otherwise limited by the arterial dominant contrast phase. Review of the MIP images confirms the above findings. NON-VASCULAR Hepatobiliary: Normal hepatic contours and density. No visible biliary dilatation. The patient is status post cholecystectomy. No biliary ductal dilation. Pancreas: Normal contours without ductal dilatation. No peripancreatic fluid collection. 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 Adrenals/Urinary Tract: --Adrenal glands: Normal. --Right kidney/ureter: No hydronephrosis or perinephric stranding. No nephrolithiasis. No obstructing ureteral stones. --Left kidney/ureter: No hydronephrosis or perinephric stranding. No nephrolithiasis. No obstructing ureteral stones. --Urinary bladder: Unremarkable. Stomach/Bowel: --Stomach/Duodenum: No hiatal hernia or other gastric abnormality. Normal duodenal course and caliber. --Small bowel: No dilatation or inflammation. --Colon: No focal abnormality. --Appendix: Normal. Lymphatic:  No abdominal or pelvic lymphadenopathy. Reproductive: No free fluid in the pelvis. Musculoskeletal. No bony spinal canal stenosis or focal osseous abnormality. Other: None. Review of the MIP images confirms the above findings. IMPRESSION: No acute aortic abnormality. Mild aortic atherosclerosis.  Aortic Atherosclerosis (ICD10-I70.0). No other acute intrathoracic or pelvic pathology. Electronically Signed   By: Jonna Clark M.D.   On: 09/23/2020 19:14   VAS [place] LOWER EXTREMITY VENOUS (DVT)  Result Date: 09/24/2020  Lower Venous DVT Study Indications: Stroke.  Comparison Study: No prior study Performing Technologist: Gertie Fey MHA,  RDMS, RVT, RDCS  Examination Guidelines: A complete evaluation includes B-mode imaging, spectral Doppler, color Doppler, and power Doppler as needed of all accessible portions of each vessel. Bilateral testing is considered an integral part of a complete examination. Limited examinations for reoccurring indications may be performed as noted. The reflux portion of the exam is performed with the patient in reverse Trendelenburg.  +---------+---------------+---------+-----------+----------+--------------+ RIGHT    CompressibilityPhasicitySpontaneityPropertiesThrombus Aging +---------+---------------+---------+-----------+----------+--------------+ CFV      Full  Yes      Yes                                 +---------+---------------+---------+-----------+----------+--------------+ SFJ      Full                                                        +---------+---------------+---------+-----------+----------+--------------+ FV Prox  Full                                                        +---------+---------------+---------+-----------+----------+--------------+ FV Mid   Full                                                        +---------+---------------+---------+-----------+----------+--------------+ FV DistalFull                                                        +---------+---------------+---------+-----------+----------+--------------+ PFV      Full                                                        +---------+---------------+---------+-----------+----------+--------------+ POP      Full           Yes      Yes                                 +---------+---------------+---------+-----------+----------+--------------+ PTV      Full                                                        +---------+---------------+---------+-----------+----------+--------------+ PERO     Full                                                         +---------+---------------+---------+-----------+----------+--------------+   +---------+---------------+---------+-----------+----------+--------------+ LEFT     CompressibilityPhasicitySpontaneityPropertiesThrombus Aging +---------+---------------+---------+-----------+----------+--------------+ CFV      Full           Yes      Yes                                 +---------+---------------+---------+-----------+----------+--------------+ SFJ  Full                                                        +---------+---------------+---------+-----------+----------+--------------+ FV Prox  Full                                                        +---------+---------------+---------+-----------+----------+--------------+ FV Mid   Full                                                        +---------+---------------+---------+-----------+----------+--------------+ FV DistalFull                                                        +---------+---------------+---------+-----------+----------+--------------+ PFV      Full                                                        +---------+---------------+---------+-----------+----------+--------------+ POP      Full           Yes      Yes                                 +---------+---------------+---------+-----------+----------+--------------+ PTV      Full                                                        +---------+---------------+---------+-----------+----------+--------------+ PERO     Full                                                        +---------+---------------+---------+-----------+----------+--------------+     Summary: RIGHT: - There is no evidence of deep vein thrombosis in the lower extremity.  - No cystic structure found in the popliteal fossa.  LEFT: - There is no evidence of deep vein thrombosis in the lower extremity.  - No cystic structure found in the popliteal  fossa.  *See table(s) above for measurements and observations.    Preliminary

## 2020-09-26 LAB — CBC
Hemoglobin: 15.7 g/dL (ref 13.0–17.0)
Platelets: 319 10*3/uL (ref 150–400)
WBC: 6.1 10*3/uL (ref 4.0–10.5)

## 2020-09-26 LAB — BETA-2-GLYCOPROTEIN I ABS, IGG/M/A
Beta-2 Glyco I IgG: <9 GPI IgG units (ref 0–20)
Beta-2-Glycoprotein I IgA: <9 GPI IgA units (ref 0–25)
Beta-2-Glycoprotein I IgM: <9 GPI IgM units (ref 0–32)

## 2020-09-26 LAB — BRAIN NATRIURETIC PEPTIDE: B Natriuretic Peptide: 346.3 pg/mL — ABNORMAL HIGH (ref 0.0–100.0)

## 2020-09-26 LAB — BASIC METABOLIC PANEL
Anion gap: 11 (ref 5–15)
BUN: 15 mg/dL (ref 6–20)
CO2: 26 mmol/L (ref 22–32)
Calcium: 8.9 mg/dL (ref 8.9–10.3)
Chloride: 98 mmol/L (ref 98–111)
Creatinine, Ser: 1.6 mg/dL — ABNORMAL HIGH (ref 0.61–1.24)
GFR, Estimated: 53 mL/min — ABNORMAL LOW (ref >60)
Glucose, Bld: 102 mg/dL — ABNORMAL HIGH (ref 70–99)
Potassium: 3.5 mmol/L (ref 3.5–5.1)
Sodium: 135 mmol/L (ref 135–145)

## 2020-09-26 MED ORDER — ALUM & MAG HYDROXIDE-SIMETH 200-200-20 MG/5ML PO SUSP
30.0000 mL | Freq: Four times a day (QID) | ORAL | Status: DC | PRN
  Administered 2020-09-26: 30 mL via ORAL
  Filled 2020-09-26: qty 30

## 2020-09-26 MED ORDER — ALPRAZOLAM 0.5 MG PO TABS
0.5000 mg | ORAL_TABLET | Freq: Once | ORAL | Status: AC
  Administered 2020-09-26: 0.5 mg via ORAL
  Filled 2020-09-26: qty 1

## 2020-09-26 NOTE — Evaluation (Signed)
Speech Language Pathology Evaluation Patient Details Name: Alejandro Perez MRN: [id number] DOB: December 07, 1975 Today's Date: 09/26/2020 Time: 1062-6948 SLP Time Calculation (min) (ACUTE ONLY): 20 min  Problem List:  Patient Active Problem List   Diagnosis Date Noted  . CKD (chronic kidney disease) 09/24/2020  . (HFpEF) heart failure with preserved ejection fraction (HCC) 09/24/2020  . CVA (cerebral vascular accident) (HCC) 09/23/2020  . SOB (shortness of breath)   . 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 Nausea 10/11/2019  . Splenomegaly 10/08/2019  . Vitamin B12 deficiency   . Pulmonary HTN (HCC)   . Chest pain 12/21/2017  . Thrombocytopenia (HCC) 12/21/2017  . Spleen enlarged 12/21/2017  . Schistocytes on peripheral blood smear 12/21/2017  . Hypertension   . Anemia   . Acquired TTP   . Abdominal pain, RUQ    Past Medical History:  Past Medical History:  Diagnosis Date  . Anemia   . Chest pain   . Glaucoma   . Hypertension   . LVH (left ventricular hypertrophy)   . Spleen enlarged   . Stroke (HCC)   . Thrombocytopenia (HCC)    Past Surgical History:  Past Surgical History:  Procedure Laterality Date  . CHOLECYSTECTOMY    . SPLENECTOMY, 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   Procedure: SPLENECTOMY;  Surgeon: [name], MD;  Location: [facility] OR;  Service: General;  Laterality: N/A;   HPI:  Pt is a 44 y.o. male with medical history significant of acquired TTP s/p splenectomy, hypertension, pulmonary hypertension, HFpEF, CKD who presented with known CVA that he was being treated for at [facility], but left AMA after he was unsatisfied with his care. Patient states that on he woke up on 10/31 with dizziness, falling to his left, feeling weak, and speech difficulty consisting of slurred speech. MRI from [place] showed moderate to large subacute cortical and subcortical infarct within the left MCA vascular territory, subacute infarct in the right corona radiata,  lacunar infarcts. CT head 11/5: Acute left MCA branch infarct. Subacute right corona radiata infarct.   Assessment / Plan / Recommendation Clinical Impression  Patient presents with a mild-moderate expressive aphasia resulting in word finding difficulties at sentence and conversational level, phonemic and semantic paraphasias (patient aware and able to correct majority). He completed confrontational naming task without difficulty and able to describe object function adequately. He did require extra time to process when completing more abstract convergent naming task. He was performing adequately with divergent naming task, however he was slowed down by correcting phonemic paraphasias and at times seemed to lose focus as he was happy he was able to be performing this task at all. Patient stated that he had been anxious and not wanting to talk because of his impairment but today he has been feeling more optomistic and that talking now with SLP has been the most he has talked in a while. Patient appeared pleased and motivated by his ability to speak at conversational level and denied any anxiety or signifcant frustration. Patient will benefit from skilled SLP intervention to improve his expressive language abilities.    SLP Assessment  SLP Recommendation/Assessment: Patient needs continued Speech Lanaguage Pathology Services SLP Visit Diagnosis: Aphasia (R47.01)    Follow Up Recommendations  Inpatient Rehab;Outpatient SLP;Home health SLP    Frequency and Duration min 2x/week  2 weeks      SLP Evaluation Cognition  Overall Cognitive Status: Impaired/Different from baseline Arousal/Alertness: Awake/alert Orientation Level: Oriented X4 Attention: Alternating Alternating Attention: Impaired Alternating Attention Impairment: Verbal complex  Memory: Appears intact Awareness: Appears intact       Comprehension  Auditory Comprehension Overall Auditory Comprehension: Appears within functional  limits for tasks assessed    Expression Expression Primary Mode of Expression: Verbal Verbal Expression Overall Verbal Expression: Impaired Initiation: No impairment Level of Generative/Spontaneous Verbalization: Sentence;Conversation Repetition: No impairment Naming: Impairment Confrontation: Within functional limits Convergent: 50-74% accurate Divergent: 75-100% accurate Verbal Errors: Phonemic paraphasias;Semantic paraphasias;Aware of errors Pragmatics: Impairment Impairments: Abnormal affect Effective Techniques: Phonemic cues;Sentence completion;Semantic cues;Open ended questions Non-Verbal Means of Communication: Not applicable   Oral / Motor  Oral Motor/Sensory Function Overall Oral Motor/Sensory Function: Within functional limits   GO                    Angela Nevin, MA, CCC-SLP Speech Therapy [facility]

## 2020-09-26 NOTE — Progress Notes (Addendum)
   09/26/20 2155  Provider Notification  Provider Name/Title Dr. De Hollingshead  Date Provider Notified 09/26/20  Time Provider Notified 2155  Notification Type Page  Notification Reason Requested by patient/family (having panick attacks)  Response Other (Comment) (waiting)    Update: Xanax 0.5mg  po ordered x1

## 2020-09-26 NOTE — Progress Notes (Signed)
   09/26/20 2022  Provider Notification  Provider Name/Title Dr Leafy Half  Date Provider Notified 09/26/20  Time Provider Notified 2022  Notification Type Page  Notification Reason Other (Comment) (HR-low 50's- want to hold metoprolol and clonidine?)  Response Other (Comment) (waiting)    Update: Per Dr. Arta Rey, Give if pt is asymptomatic and hold clonidine and metoprolol  if HR is below 50 At time of administration.  Will continue to monitor.

## 2020-09-26 NOTE — Progress Notes (Signed)
PROGRESS NOTE                                                                             PROGRESS NOTE                                                                                                                                                                                                             Patient Demographics:    Alejandro Perez, is a 44 y.o. male, DOB - 1976-03-05, UJW:[id number]  Outpatient Primary MD for the patient is Patient, No Pcp Per    LOS - 2  Admit date - 09/23/2020    Chief Complaint  Patient presents with  . Weakness       Brief Narrative   HPI: Alejandro Perez is a 44 y.o. male with medical history significant of acquired TTP status post splenectomy, hypertension, pulmonary hypertension, HFpEF, CKD presented with known CVA that he was being treated for at [facility], but left AMA after he was unsatisfied with his care.  Patient states that on Sunday he woke up with dizziness, falling to his left, feeling weak, and speech difficulty consisting of slurred speech with his daughter.  Patient had initial work-up at [facility] and had been there for several days.  MRI from [place] showed moderate to large subacute cortical and subcortical infarct within the left MCA vascular territory, subacute infarct in the right corona radiata, lacunar infarcts.  Patient states that he had a fall while there and landed on his face with his wheelchair landed on top of him but was unable to get help.  And that is why he left and came to [facility].  He was accompanied with some records but it is unclear what the results of his full work-up was.  Patient reports some chest tightness, chronic abdominal pain, headache.  He denies constipation or diarrhea  ED Course: Vital significant for hypertension in the ED here.  Labs showed creatinine of 1.5 stable for his baseline.  LFTs within normal limits.  CBC showed  hemoglobin of 17.  PT INR and  PTT within normal limits.  Initial troponin XVIII, second pending.  Respiratory viral panel pending when seen.  EKG showed normal sinus rhythm with LVH and QTC prolongation at 45.   Subjective:    Alejandro Perez today did complain of some intermittent episodes of dyspnea, upon further discussion does report a history most likely related to anxiety. There is no further complaints of headache.   Assessment  & Plan :    Principal Problem:   CVA (cerebral vascular accident) (HCC) Active Problems:   Hypertension   CKD (chronic kidney disease)   (HFpEF) heart failure with preserved ejection fraction (HCC)    Acute CVA - L MCA infarct embolic secondary to uncontrolled risk factor with HTN, HLD and cocaine use.  -Lower extremity Dopplers with no DVT, transcranial Dopplers with no RT L shunting, 2D echo with a preserved EF 55 to 60%. -Suspicion for embolic stroke, continue with telemetry monitoring, neurology recommend outpatient cardiac event monitoring to rule out A. Fib.. -Discussed with neurology, concern for embolic stroke, they do recommend consideration for TEE, discussed with cardiology, they will arrange for Monday. -Now on aspirin and Plavix for 3 weeks, then aspirin alone.. -PT/OT/SLP consulted. -Yesterday patient was complaining of headache, and noted to have some worsening speech, repeat CT head no acute findings, this morning headache has resolved, and his speech appears to be improving.  HTN - Resume home clonidine, amlodipine, metoprolol, Lasix  HFpEF - Resume home metoprolol, Lasix  AKI on CKD -creatinine peaked at 1.8, possible contrast nephropathy, improving with IV fluids.  -Avoid nephrotoxic medications.  Hyperlipidemia -Started on statin  Head Ache/Chronic Pain  - Received headache cocktail on IV opiates in ED with minimal improvement - We will provide home opioid medication, will add low-dose IV Dilaudid given pain due to nasal fracture.  Polysubstance  use > Patient has a history of marijuana and recent trial of ecstasy.  He denies cocaine but has had positive cocaine on his UDS is at other hospitalizations. - Continue to monitor  Dyspnea -Most likely related to anxiety, chest x-ray clear, no evidence of volume overload on physical exam as well.  SpO2: 99 %  Recent Labs  Lab 09/23/20 1407 09/23/20 2341 09/24/20 0238 09/25/20 1807 09/26/20 0500  WBC 6.7  --  6.9 7.1 6.1  PLT 315  --  301 350 319  BNP  --   --   --  411.2* 346.3*  AST 21  --   --   --   --   ALT 16  --   --   --   --   ALKPHOS 84  --   --   --   --   BILITOT 0.8  --   --   --   --   ALBUMIN 4.3  --   --   --   --   INR 1.0  --   --   --   --   SARSCOV2NAA  --  NEGATIVE  --   --   --        ABG     Component Value Date/Time   PHART 7.358 10/13/2019 0426   PCO2ART 54.3 (H) 10/13/2019 0426   PO2ART 59.3 (L) 10/13/2019 0426   HCO3 29.8 (H) 10/13/2019 0426   O2SAT 88.2 10/13/2019 0426       Condition - Extremely Guarded  Family Communication  :  none at bedside  Code Status :  Full  Consults  :  Neurology  Procedures  :  none  Disposition Plan  :    Status is: inpatient  The patient will require care spanning > 2 midnights and should be moved to inpatient because: Hemodynamically unstable and Ongoing diagnostic testing needed not appropriate for outpatient work up  Dispo: The patient is from: Home              Anticipated d/c is to: Home              Anticipated d/c date is: 2 days              Patient currently is not medically stable to d/c.  With worsening creatinine      DVT Prophylaxis  :  Lovenox   Lab Results  Component Value Date   PLT 319 09/26/2020    Diet :  Diet Order            Diet NPO time specified Except for: Sips with Meds  Diet effective midnight           Diet Heart Room service appropriate? Yes; Fluid consistency: Thin  Diet effective now                  Inpatient Medications  Scheduled  Meds: . amLODipine  10 mg Oral Daily  . aspirin EC  81 mg Oral Daily  . atorvastatin  40 mg Oral Daily  . cloNIDine  0.2 mg Oral BID  . clopidogrel  75 mg Oral Daily  . enoxaparin (LOVENOX) injection  40 mg Subcutaneous Q24H  . folic acid  1 mg Oral Daily  . metoprolol tartrate  25 mg Oral BID  . pantoprazole  40 mg Oral Daily  . sodium chloride flush  3 mL Intravenous Once  . sodium chloride flush  3 mL Intravenous Q12H  . cyanocobalamin  1,000 mcg Oral Daily   Continuous Infusions: . lactated ringers 100 mL/hr at 09/25/20 1435   PRN Meds:.acetaminophen **OR** acetaminophen, albuterol, diphenhydrAMINE, HYDROmorphone (DILAUDID) injection, oxyCODONE, polyethylene glycol  Antibiotics  :    Anti-infectives (From admission, onward)   None      Huey Bienenstock M.D on 09/26/2020 at 11:02 AM  To page go to [email or web address]   [facility] -  Office  [phone]     Objective:   Vitals:   09/26/20 0438 09/26/20 0500 09/26/20 0503 09/26/20 0844  BP: 138/73   (!) 142/75  Pulse: (!) 48   (!) 52  Resp: Temp: 98 F (36.7 C)   98.2 F (36.8 C)  TempSrc: Oral   Oral  SpO2: 99%   99%  Weight:      Height:        Wt Readings from Last 3 Encounters:  09/26/20 68.7 kg  10/09/19 73.5 kg  12/21/17 71.2 kg     Intake/Output Summary (Last 24 hours) at 09/26/2020 1102 Last data filed at 09/26/2020 0600 Gross per 24 hour  Intake 2433.32 ml  Output 800 ml  Net 1633.32 ml     Physical Exam  Awake Alert, Oriented X 3, patient with poor vision (he reports he is legally blind), his speech appears to be better today.  Symmetrical Chest wall movement, Good air movement bilaterally, CTAB RRR,No Gallops,Rubs or new Murmurs, No Parasternal Heave +ve B.Sounds, Abd Soft, No tenderness, No rebound - guarding or rigidity. No Cyanosis, Clubbing or edema, No new Rash or bruise  Data Review:    CBC Recent Labs  Lab 09/23/20 1407 09/24/20 0238  09/25/20 1807 09/26/20 0500  WBC 6.7 6.9 7.1 6.1  HGB 17.9* 16.3 17.5* 15.7  HCT 49.6 45.0 47.2 RESULTS UNAVAILABLE DUE TO INTERFERING SUBSTANCE  PLT 315 301 350 319  MCV 86.6 88.2 83.7 RESULTS UNAVAILABLE DUE TO INTERFERING SUBSTANCE  MCH 31.2 32.0 31.0 RESULTS UNAVAILABLE DUE TO INTERFERING SUBSTANCE  MCHC 36.1* 36.2* 37.1* RESULTS UNAVAILABLE DUE TO INTERFERING SUBSTANCE  RDW 13.0 12.9 12.4 RESULTS UNAVAILABLE DUE TO INTERFERING SUBSTANCE  LYMPHSABS 1.3  --   --   --   MONOABS 0.9  --   --   --   EOSABS 0.5  --   --   --   BASOSABS 0.1  --   --   --     Recent Labs  Lab 09/23/20 1407 09/24/20 0238 09/24/20 1011 09/25/20 0226 09/25/20 1807 09/26/20 0500  NA 138 136  --  135  --  135  K 4.2 4.1  --  4.8  --  3.5  CL 100 101  --  97*  --  98  CO2 25 22  --  22  --  26  GLUCOSE 101* 95  --  66*  --  102*  BUN 11 12  --  20  --  15  CREATININE 1.54* 1.61*  --  1.85*  --  1.60*  CALCIUM 9.8 9.2  --  9.3  --  8.9  AST 21  --   --   --   --   --   ALT 16  --   --   --   --   --   ALKPHOS 84  --   --   --   --   --   BILITOT 0.8  --   --   --   --   --   ALBUMIN 4.3  --   --   --   --   --   INR 1.0  --   --   --   --   --   TSH  --   --  1.938  --   --   --   HGBA1C  --  4.8  --   --   --   --   BNP  --   --   --   --  411.2* 346.3*    ------------------------------------------------------------------------------------------------------------------ Recent Labs    09/24/20 0238  CHOL 186  HDL 44  LDLCALC 130*  TRIG 62  CHOLHDL 4.2    Lab Results  Component Value Date   HGBA1C 4.8 09/24/2020   ------------------------------------------------------------------------------------------------------------------ Recent Labs    09/24/20 1011  TSH 1.938    Cardiac Enzymes No results for input(s): CKMB, TROPONINI, MYOGLOBIN in the last 168 hours.  Invalid input(s):  CK ------------------------------------------------------------------------------------------------------------------    Component Value Date/Time   BNP 346.3 (H) 09/26/2020 0500    Micro Results Recent Results (from the past 240 hour(s))  Respiratory Panel by RT PCR (Flu A&B, Covid) - Nasopharyngeal Swab     Status: None   Collection Time: 09/23/20 11:41 PM   Specimen: Nasopharyngeal Swab  Result Value Ref Range Status   SARS Coronavirus 2 by RT PCR NEGATIVE NEGATIVE Final    Comment: (NOTE) SARS-CoV-2 target nucleic acids are NOT DETECTED.  The SARS-CoV-2 RNA is generally detectable in upper respiratoy specimens during the acute phase of infection. The lowest concentration of SARS-CoV-2 viral copies this  assay can detect is 131 copies/mL. A negative result does not preclude SARS-Cov-2 infection and should not be used as the sole basis for treatment or other patient management decisions. A negative result may occur with  improper specimen collection/handling, submission of specimen other than nasopharyngeal swab, presence of viral mutation(s) within the areas targeted by this assay, and inadequate number of viral copies (<131 copies/mL). A negative result must be combined with clinical observations, patient history, and epidemiological information. The expected result is Negative.  Fact Sheet for Patients:  https://www.moore.com/https://www.fda.gov/media/142436/download  Fact Sheet for Healthcare Providers:  https://www.young.biz/https://www.fda.gov/media/142435/download  This test is no t yet approved or cleared by the [place] FDA and  has been authorized for detection and/or diagnosis of SARS-CoV-2 by FDA under an Emergency Use Authorization (EUA). This EUA will remain  in effect (meaning this test can be used) for the duration of the COVID-19 declaration under Section 564(b)(1) of the Act, 21 U.S.C. section 360bbb-3(b)(1), unless the authorization is terminated or revoked sooner.     Influenza A by PCR  NEGATIVE NEGATIVE Final   Influenza B by PCR NEGATIVE NEGATIVE Final    Comment: (NOTE) The Xpert Xpress SARS-CoV-2/FLU/RSV assay is intended as an aid in  the diagnosis of influenza from Nasopharyngeal swab specimens and  should not be used as a sole basis for treatment. Nasal washings and  aspirates are unacceptable for Xpert Xpress SARS-CoV-2/FLU/RSV  testing.  Fact Sheet for Patients: https://www.moore.com/https://www.fda.gov/media/142436/download  Fact Sheet for Healthcare Providers: https://www.young.biz/https://www.fda.gov/media/142435/download  This test is not yet approved or cleared by the [place] FDA and  has been authorized for detection and/or diagnosis of SARS-CoV-2 by  FDA under an Emergency Use Authorization (EUA). This EUA will remain  in effect (meaning this test can be used) for the duration of the  Covid-19 declaration under Section 564(b)(1) of the Act, 21  U.S.C. section 360bbb-3(b)(1), unless the authorization is  terminated or revoked. Performed at [facility], [address] [place], [place] [address]     Radiology Reports CT HEAD WO CONTRAST  Result Date: 09/25/2020 CLINICAL DATA:  Stroke.  Worsening headache. EXAM: CT HEAD WITHOUT CONTRAST TECHNIQUE: Contiguous axial images were obtained from the base of the skull through the vertex without intravenous contrast. COMPARISON:  CT head 09/24/2020 FINDINGS: Brain: Hypodensity in the left temporoparietal lobe unchanged. No acute hemorrhage identified. Subtle hypodensity right corona radiata may represent a recent infarct. Small hypodensity in the left posterior insula and external capsule unchanged consistent with chronic infarct. Chronic infarct right thalamus unchanged. Ventricle size normal without midline shift. No acute hemorrhage. Vascular: Negative for hyperdense vessel Skull: Negative Sinuses/Orbits: Mild mucosal edema left maxillary sinus. Negative orbit Other: None IMPRESSION: Acute infarct left temporoparietal lobe unchanged. No  associated hemorrhage. No change from the prior CT. Electronically Signed   By: Marlan Palauharles  Clark M.D.   On: 09/25/2020 17:08   CT HEAD WO CONTRAST  Result Date: 09/24/2020 CLINICAL DATA:  Severe headache.  History of stroke. EXAM: CT HEAD WITHOUT CONTRAST TECHNIQUE: Contiguous axial images were obtained from the base of the skull through the vertex without intravenous contrast. COMPARISON:  Yesterday FINDINGS: Brain: More well-defined cytotoxic edema in the left frontal parietal convexity, expected. Subacute infarct at the right corona radiata is unchanged. Chronic lacunar infarct at the right thalamus and left corona radiata. No acute hemorrhage, hydrocephalus, or significant mass effect. Vascular: No hyperdense vessel or unexpected calcification. Skull: Remote nasal bone fractures. Sinuses/Orbits: No acute finding. IMPRESSION: 1. No new abnormality.  2. Acute left MCA branch infarct. 3. Subacute right corona radiata infarct. 4. Chronic lacunar infarcts. Electronically Signed   By: Marnee Spring M.D.   On: 09/24/2020 06:07   CT HEAD WO CONTRAST  Result Date: 09/23/2020 CLINICAL DATA:  Neuro deficit, acute stroke suspected. EXAM: CT HEAD WITHOUT CONTRAST TECHNIQUE: Contiguous axial images were obtained from the base of the skull through the vertex without intravenous contrast. COMPARISON:  Noncontrast head CT 09/20/2020. CT angiogram head/neck 09/20/2020. Noncontrast head CT 09/21/2020. Brain MRI 09/22/2020. FINDINGS: Brain: Cerebral volume is normal. A moderate to large acute/early subacute cortical and subcortical infarct within the left parietal lobe, posterosuperior left temporal lobe and posterior left insula has not significantly changed in extent as compared to the brain MRI performed one day prior. Subtle petechial hemorrhage is questioned, but there is no frank hemorrhagic conversion. Regional mass effect. No midline shift A small subacute infarct within the right corona radiata was better  appreciated on the prior MRI. Redemonstrated chronic lacunar infarcts within the bilateral cerebral white matter, left basal ganglia and right thalamus. No extra-axial fluid collection. No evidence of intracranial mass. No midline shift. Vascular: Atherosclerotic calcifications. Skull: Normal. Negative for fracture or focal lesion. Sinuses/Orbits: Visualized orbits show no acute finding. Mild paranasal sinus mucosal thickening, most notably ethmoidal. Other: There are acute, comminuted and displaced bilateral nasal bone fractures, new as compared to prior examinations. Partial opacification of the right nasal passage. Suspected additional minimally displaced acute fractures of the bony nasal septum (for instance as seen on series 4, images 16 and 17). IMPRESSION: A moderate to large acute/early subacute cortical and subcortical infarct within the left MCA vascular territory has not significantly changed in extent as compared to the brain MRI performed one day prior. There is regional mass effect but no midline shift. Subtle petechial hemorrhage is questioned, but there is no frank hemorrhagic conversion. A small subacute infarct within the right corona radiata was better appreciated on the prior MRI. Redemonstrated chronic lacunar infarcts within the bilateral cerebral white matter, left basal ganglia and right thalamus. Acute, comminuted and displaced bilateral nasal bone fractures which are new as compared to the prior examinations. Suspected minimally displaced acute fractures of the bony nasal septum. Partial opacification of the right nasal passage. Consider dedicated maxillofacial CT for further evaluation. Electronically Signed   By: Jackey Loge DO   On: 09/23/2020 19:23   DG Chest Port 1 View  Result Date: 09/25/2020 CLINICAL DATA:  Dyspnea EXAM: PORTABLE CHEST 1 VIEW COMPARISON:  09/23/2020 chest radiograph. FINDINGS: Stable cardiomediastinal silhouette with normal heart size. No pneumothorax. No  pleural effusion. Lungs appear clear, with no acute consolidative airspace disease and no pulmonary edema. IMPRESSION: No active disease. Electronically Signed   By: Delbert Phenix M.D.   On: 09/25/2020 07:59   DG Chest Portable 1 View  Result Date: 09/23/2020 CLINICAL DATA:  Chest pain EXAM: PORTABLE CHEST 1 VIEW COMPARISON:  September 21, 2020 FINDINGS: The heart size and mediastinal contours are within normal limits. Both lungs are clear. The visualized skeletal structures are unremarkable. IMPRESSION: No active disease. Electronically Signed   By: Jonna Clark M.D.   On: 09/23/2020 16:49   VAS [place] TRANSCRANIAL DOPPLER W BUBBLES  Result Date: 09/24/2020  Transcranial Doppler with Bubble Indications: Stroke. Comparison Study: No prior study Performing Technologist: Gertie Fey MHA, RDMS, RVT, RDCS  Examination Guidelines: A complete evaluation includes B-mode imaging, spectral Doppler, color Doppler, and power Doppler as needed of all accessible portions of each  vessel. Bilateral testing is considered an integral part of a complete examination. Limited examinations for reoccurring indications may be performed as noted.  Summary: No HITS at rest or during Valsalva. Negative transcranial Doppler Bubble study with no evidence of right to left intracardiac communication.  A vascular evaluation was performed. The right middle cerebral artery was studied. An IV was inserted into the patient's right forearm. Verbal informed consent was obtained.  *See table(s) above for TCD measurements and observations.    Preliminary    ECHOCARDIOGRAM COMPLETE  Result Date: 09/24/2020    ECHOCARDIOGRAM REPORT   Patient Name:   Alejandro Perez Date of Exam: 09/24/2020 Medical Rec #:  [id number]    Height:       74.0 in Accession #:    [id number]   Weight:       159.3 lb Date of Birth:  [birth year]   BSA:          1.973 m Patient Age:    44 years     BP:           155/87 mmHg Patient Gender: M            HR:           53 bpm.  Exam Location:  Inpatient Procedure: 2D Echo, Color Doppler, Cardiac Doppler and Strain Analysis Indications:    Stroke i163.9  History:        Patient has prior history of Echocardiogram examinations, most                 recent 12/21/2017. Risk Factors:Hypertension.  Sonographer:    Irving Burton Senior RDCS Referring Phys: [id number] Scheryl Marten XU IMPRESSIONS  1. Left ventricular ejection fraction, by estimation, is 55 to 60%. The left ventricle has normal function. The left ventricle has no regional wall motion abnormalities. There is severe left ventricular hypertrophy. Left ventricular diastolic parameters  are indeterminate.  2. Right ventricular systolic function is normal. The right ventricular size is normal.  3. Left atrial size was mild to moderately dilated.  4. Mild mitral valve regurgitation.  5. 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 Aortic valve regurgitation is mild to moderate. Mild aortic valve sclerosis is present, with no evidence of aortic valve stenosis.  6. The inferior vena cava is normal in size with greater than 50% respiratory variability, suggesting right atrial pressure of 3 mmHg. FINDINGS  Left Ventricle: Left ventricular ejection fraction, by estimation, is 55 to 60%. The left ventricle has normal function. The left ventricle has no regional wall motion abnormalities. The left ventricular internal cavity size was normal in size. There is  severe left ventricular hypertrophy. Left ventricular diastolic parameters are indeterminate. Right Ventricle: The right ventricular size is normal. Right vetricular wall thickness was not assessed. Right ventricular systolic function is normal. Left Atrium: Left atrial size was mild to moderately dilated. Right Atrium: Right atrial size was normal in size. Pericardium: There is no evidence of pericardial effusion. Mitral Valve: The mitral valve is abnormal. There is mild thickening of the mitral valve  leaflet(s). Mild mitral annular calcification. Mild mitral valve regurgitation. Tricuspid Valve: The tricuspid valve is normal in structure. Tricuspid valve regurgitation is trivial. 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 The aortic valve is abnormal. Aortic valve regurgitation is mild to moderate. Aortic regurgitation PHT measures 897 msec. Mild aortic valve sclerosis is present, with no evidence of aortic valve stenosis. Pulmonic Valve: The pulmonic valve  was grossly normal. Pulmonic valve regurgitation is not visualized. Aorta: The aortic root and ascending aorta are structurally normal, with no evidence of dilitation. Venous: The inferior vena cava is normal in size with greater than 50% respiratory variability, suggesting right atrial pressure of 3 mmHg. IAS/Shunts: No atrial level shunt detected by color flow Doppler.  LEFT VENTRICLE PLAX 2D LVIDd:         4.30 cm  Diastology LVIDs:         2.90 cm  LV e' medial:    5.22 cm/s LV PW:         1.50 cm  LV E/e' medial:  11.9 LV IVS:        1.80 cm  LV e' lateral:   5.11 cm/s LVOT diam:     2.10 cm  LV E/e' lateral: 12.2 LV SV:         87 LV SV Index:   44 LVOT Area:     3.46 cm  RIGHT VENTRICLE RV S prime:     11.00 cm/s TAPSE (M-mode): 2.3 cm LEFT ATRIUM              Index       RIGHT ATRIUM           Index LA diam:        4.50 cm  2.28 cm/m  RA Area:     14.10 cm LA Vol (A2C):   112.0 ml 56.76 ml/m RA Volume:   29.70 ml  15.05 ml/m LA Vol (A4C):   76.1 ml  38.57 ml/m LA Biplane Vol: 92.1 ml  46.68 ml/m  AORTIC VALVE LVOT Vmax:   111.00 cm/s LVOT Vmean:  71.700 cm/s LVOT VTI:    0.252 m AI PHT:      897 msec  AORTA Ao Root diam: 3.70 cm Ao Asc diam:  3.50 cm MITRAL VALVE MV Area (PHT): 2.62 cm    SHUNTS MV Decel Time: 290 msec    Systemic VTI:  0.25 m MV E velocity: 62.20 cm/s  Systemic Diam: 2.10 cm MV A velocity: 48.00 cm/s MV E/A ratio:  1.30 Dietrich Pates MD Electronically signed by Dietrich Pates MD Signature Date/Time:  09/24/2020/3:44:05 PM    Final    CT Angio Chest/Abd/Pel for Dissection W and/or Wo Contrast  Result Date: 09/23/2020 CLINICAL DATA:  Abdominal pain, question aortic dissection EXAM: CT ANGIOGRAPHY CHEST, ABDOMEN AND PELVIS TECHNIQUE: Non-contrast CT of the chest was initially obtained. Multidetector CT imaging through the chest, abdomen and pelvis was performed using the standard protocol during bolus administration of intravenous contrast. Multiplanar reconstructed images and MIPs were obtained and reviewed to evaluate the vascular anatomy. CONTRAST:  OMNIPAQUE IOHEXOL 350 MG/ML SOLN COMPARISON:  None. FINDINGS: CTA CHEST FINDINGS Cardiovascular: --Heart: The heart size is normal.  There is nopericardial effusion. --Aorta: The course and caliber of the thoracic aorta are normal. There is no aortic atherosclerotic calcification. Precontrast images show no aortic intramural hematoma. There is no blood pool, dissection or penetrating ulcer demonstrated on arterial phase postcontrast imaging. There is a origin of the left carotid artery the brachiocephalic trunk. Minimal calcifications seen at the origin of brachiocephalic trunk. The proximal arch vessels are widely patent. --Pulmonary Arteries: Contrast timing is optimized for preferential opacification of the aorta. Within that limitation, normal central pulmonary arteries. Mediastinum/Nodes: No mediastinal, hilar or axillary lymphadenopathy. The visualized thyroid and thoracic esophageal course are unremarkable. Lungs/Pleura: Mild ground-glass opacity seen at the posterior right lung base. There  is a calcified granuloma seen in the right middle lobe. No focal airspace consolidation. No focal pleural abnormality. Musculoskeletal: No chest wall abnormality. No acute osseous findings. Review of the MIP images confirms the above findings. CTA ABDOMEN AND PELVIS FINDINGS VASCULAR Aorta: Normal caliber aorta without aneurysm, dissection, vasculitis or  hemodynamically significant stenosis. There is scattered mild aortic atherosclerosis at the aorta bi-iliac bifurcation. Celiac: No aneurysm, dissection or hemodynamically significant stenosis. Normal branching pattern SMA: Widely patent without dissection or stenosis. Renals: Single renal arteries bilaterally. No aneurysm, dissection, stenosis or evidence of fibromuscular dysplasia. IMA: Patent without abnormality. Inflow: Scattered atherosclerosis at the common iliac vasculature. Veins: Normal course and caliber of the major veins. Assessment is otherwise limited by the arterial dominant contrast phase. Review of the MIP images confirms the above findings. NON-VASCULAR Hepatobiliary: Normal hepatic contours and density. No visible biliary dilatation. The patient is status post cholecystectomy. No biliary ductal dilation. Pancreas: Normal contours without ductal dilatation. No peripancreatic fluid collection. 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 Adrenals/Urinary Tract: --Adrenal glands: Normal. --Right kidney/ureter: No hydronephrosis or perinephric stranding. No nephrolithiasis. No obstructing ureteral stones. --Left kidney/ureter: No hydronephrosis or perinephric stranding. No nephrolithiasis. No obstructing ureteral stones. --Urinary bladder: Unremarkable. Stomach/Bowel: --Stomach/Duodenum: No hiatal hernia or other gastric abnormality. Normal duodenal course and caliber. --Small bowel: No dilatation or inflammation. --Colon: No focal abnormality. --Appendix: Normal. Lymphatic:  No abdominal or pelvic lymphadenopathy. Reproductive: No free fluid in the pelvis. Musculoskeletal. No bony spinal canal stenosis or focal osseous abnormality. Other: None. Review of the MIP images confirms the above findings. IMPRESSION: No acute aortic abnormality. Mild aortic atherosclerosis.  Aortic Atherosclerosis (ICD10-I70.0). No other acute intrathoracic or pelvic pathology. Electronically Signed   By: Jonna Clark M.D.    On: 09/23/2020 19:14   VAS [place] LOWER EXTREMITY VENOUS (DVT)  Result Date: 09/25/2020  Lower Venous DVT Study Indications: Stroke.  Comparison Study: No prior study Performing Technologist: Gertie Fey MHA, RDMS, RVT, RDCS  Examination Guidelines: A complete evaluation includes B-mode imaging, spectral Doppler, color Doppler, and power Doppler as needed of all accessible portions of each vessel. Bilateral testing is considered an integral part of a complete examination. Limited examinations for reoccurring indications may be performed as noted. The reflux portion of the exam is performed with the patient in reverse Trendelenburg.  +---------+---------------+---------+-----------+----------+--------------+ RIGHT    CompressibilityPhasicitySpontaneityPropertiesThrombus Aging +---------+---------------+---------+-----------+----------+--------------+ CFV      Full           Yes      Yes                                 +---------+---------------+---------+-----------+----------+--------------+ SFJ      Full                                                        +---------+---------------+---------+-----------+----------+--------------+ FV Prox  Full                                                        +---------+---------------+---------+-----------+----------+--------------+ FV Mid   Full                                                        +---------+---------------+---------+-----------+----------+--------------+  FV DistalFull                                                        +---------+---------------+---------+-----------+----------+--------------+ PFV      Full                                                        +---------+---------------+---------+-----------+----------+--------------+ POP      Full           Yes      Yes                                 +---------+---------------+---------+-----------+----------+--------------+ PTV       Full                                                        +---------+---------------+---------+-----------+----------+--------------+ PERO     Full                                                        +---------+---------------+---------+-----------+----------+--------------+   +---------+---------------+---------+-----------+----------+--------------+ LEFT     CompressibilityPhasicitySpontaneityPropertiesThrombus Aging +---------+---------------+---------+-----------+----------+--------------+ CFV      Full           Yes      Yes                                 +---------+---------------+---------+-----------+----------+--------------+ SFJ      Full                                                        +---------+---------------+---------+-----------+----------+--------------+ FV Prox  Full                                                        +---------+---------------+---------+-----------+----------+--------------+ FV Mid   Full                                                        +---------+---------------+---------+-----------+----------+--------------+ FV DistalFull                                                        +---------+---------------+---------+-----------+----------+--------------+  PFV      Full                                                        +---------+---------------+---------+-----------+----------+--------------+ POP      Full           Yes      Yes                                 +---------+---------------+---------+-----------+----------+--------------+ PTV      Full                                                        +---------+---------------+---------+-----------+----------+--------------+ PERO     Full                                                        +---------+---------------+---------+-----------+----------+--------------+     Summary: RIGHT: - There is no evidence of deep vein  thrombosis in the lower extremity.  - No cystic structure found in the popliteal fossa.  LEFT: - There is no evidence of deep vein thrombosis in the lower extremity.  - No cystic structure found in the popliteal fossa.  *See table(s) above for measurements and observations. Electronically signed by Lemar Livings MD on 09/25/2020 at 3:31:03 PM.    Final

## 2020-09-27 LAB — BASIC METABOLIC PANEL
Anion gap: 11 (ref 5–15)
BUN: 6 mg/dL (ref 6–20)
CO2: 27 mmol/L (ref 22–32)
Calcium: 9.4 mg/dL (ref 8.9–10.3)
Chloride: 102 mmol/L (ref 98–111)
Creatinine, Ser: 1.49 mg/dL — ABNORMAL HIGH (ref 0.61–1.24)
GFR, Estimated: 58 mL/min — ABNORMAL LOW (ref >60)
Glucose, Bld: 110 mg/dL — ABNORMAL HIGH (ref 70–99)
Potassium: 3.9 mmol/L (ref 3.5–5.1)
Sodium: 140 mmol/L (ref 135–145)

## 2020-09-27 LAB — ANTINUCLEAR ANTIBODIES, IFA: ANA Ab, IFA: NEGATIVE

## 2020-09-27 MED ORDER — HYDROMORPHONE HCL 1 MG/ML IJ SOLN
0.5000 mg | Freq: Four times a day (QID) | INTRAMUSCULAR | Status: DC | PRN
  Administered 2020-09-27 – 2020-09-28 (×4): 0.5 mg via INTRAVENOUS
  Filled 2020-09-27 (×3): qty 0.5

## 2020-09-27 MED ORDER — HYDROCHLOROTHIAZIDE 12.5 MG PO CAPS
12.5000 mg | ORAL_CAPSULE | Freq: Every day | ORAL | Status: DC
  Administered 2020-09-27 – 2020-09-28 (×2): 12.5 mg via ORAL
  Filled 2020-09-27 (×2): qty 1

## 2020-09-27 MED ORDER — METOPROLOL TARTRATE 12.5 MG HALF TABLET
12.5000 mg | ORAL_TABLET | Freq: Two times a day (BID) | ORAL | Status: DC
  Administered 2020-09-27: 12.5 mg via ORAL
  Filled 2020-09-27 (×3): qty 1

## 2020-09-27 MED ORDER — ALPRAZOLAM 0.25 MG PO TABS
0.2500 mg | ORAL_TABLET | Freq: Two times a day (BID) | ORAL | Status: DC | PRN
  Administered 2020-09-27 – 2020-09-28 (×4): 0.25 mg via ORAL
  Filled 2020-09-27 (×4): qty 1

## 2020-09-27 NOTE — Evaluation (Signed)
Occupational Therapy Evaluation Patient Details Name: Alejandro Perez MRN: [id number] DOB: 22-Nov-1975 Today's Date: 09/27/2020    History of Present Illness The pt is a 44 yo male presenting with weakness, SOB, and slurred speech in addition to chest, head, and throat pain. Pt at [facility] hospital yesterday for stroke, left AMA as he was unsatisfied with his care. MRI from [place] showed moderate to large subacute cortical and subcortical infarct within the left MCA vascular territory, subacute infarct in the right corona radiata, lacunar infarcts.    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  Admitted for above and limited by problem list below, including R sided decreased coordination, dizziness with sitting EOB, impaired balance and decreased activity tolerance.  He is able to follow simple commands but is highly distractible, easily frustrated due to speech/word finding deficits, and anxious due to fear of falling. He is agreeable to sit EOB but declines further mobility without 2 assist.  He completes bed mobility with min guard, UB ADLs with min assist, grooming with setup and LB ADLs with min assist.  Believe he will progress well, anticipate outpatient OT at dc.  Will follow acutely to optimize return to PLOF.      Follow Up Recommendations  Outpatient OT;Supervision/Assistance - 24 hour (neuro )    Equipment Recommendations  3 in 1 bedside commode    Recommendations for Other Services       Precautions / Restrictions Precautions Precautions: Fall Precaution Comments: legally blind  Restrictions Weight Bearing Restrictions: No      Mobility Bed Mobility Overal bed mobility: Needs Assistance Bed Mobility: Supine to Sit;Sit to Supine     Supine to sit: Min guard Sit to supine: Supervision   General bed mobility comments: for safety, pt fearful of falling and requires increased  time to transition to EOB; returned to supine with no assist'; transitioned to long sitting with supervision     Transfers                 General transfer comment: pt declined, reports needing 2 people to assist him     Balance Overall balance assessment: Needs assistance Sitting-balance support: Feet supported;No upper extremity supported Sitting balance-Leahy Scale: Poor Sitting balance - Comments: min guard to min assist for safety        Standing balance comment: unable                            ADL either performed or assessed with clinical judgement   ADL Overall ADL's : Needs assistance/impaired     Grooming: Set up;Bed level;Wash/dry face   Upper Body Bathing: Minimal assistance;Sitting   Lower Body Bathing: Minimal assistance;Sitting/lateral leans;Bed level   Upper Body Dressing : Minimal assistance;Sitting   Lower Body Dressing: Minimal assistance;Sitting/lateral leans;Bed level Lower Body Dressing Details (indicate cue type and reason): able to don socks, declined standing today    Toilet Transfer Details (indicate cue type and reason): pt declined          Functional mobility during ADLs: Minimal assistance;Cueing for safety (to EOB only ) General ADL Comments: pt limited by cognition, vision, balance, dizziness upon sitting      Vision Baseline Vision/History: Legally blind;Glaucoma Patient Visual Report: Other (comment) (legally blind- glaucoma since 44 yo) Additional Comments: pt reports "worse" since stroke; further assessment required  Perception     Praxis      Pertinent Vitals/Pain Pain Assessment: Faces Faces Pain Scale: Hurts a little bit Pain Location: head, chest, stomach Pain Descriptors / Indicators: Discomfort Pain Intervention(s): Limited activity within patient's tolerance;Monitored during session;Repositioned (recently recieved pain meds per pt)     Hand Dominance Right   Extremity/Trunk Assessment  Upper Extremity Assessment Upper Extremity Assessment: RUE deficits/detail RUE Deficits / Details: able to use functionally to don socks, decreased coordination noted with finger to nose and tip to tip; hypersensitive and reports pain during MMT testing  RUE Sensation: decreased light touch;decreased proprioception RUE Coordination: decreased fine motor;decreased gross motor   Lower Extremity Assessment Lower Extremity Assessment: Defer to PT evaluation   Cervical / Trunk Assessment Cervical / Trunk Assessment: Normal   Communication Communication Communication: Expressive difficulties   Cognition Arousal/Alertness: Awake/alert Behavior During Therapy: Restless;Anxious Overall Cognitive Status: Impaired/Different from baseline Area of Impairment: Safety/judgement;Problem solving                         Safety/Judgement: Decreased awareness of safety;Decreased awareness of deficits   Problem Solving: Difficulty sequencing;Requires verbal cues General Comments: patient with some decreased awareness to deficits, anxious and fearful with mobility due to recent fall; requires increased time for word finding and easily frustrated   General Comments  VSS, pt with anxiety about falling; dizziness once sitting EOB    Exercises     Shoulder Instructions      Home Living Family/patient expects to be discharged to:: Private residence Living Arrangements: Spouse/significant other Available Help at Discharge: Family Type of Home: House Home Access: Stairs to enter Secretary/administrator of Steps: 5   Home Layout: One level     Bathroom Shower/Tub: Chief Strategy Officer: Standard     Home Equipment: None   Additional Comments: spouse, sister when spouse at work       Prior Functioning/Environment Level of Independence: Independent        Comments: on disability, does not drive, independent ADLs/IADLS         OT Problem List: Decreased  strength;Decreased activity tolerance;Impaired balance (sitting and/or standing);Decreased cognition;Decreased coordination;Impaired vision/perception;Decreased safety awareness;Decreased knowledge of use of DME or AE;Decreased knowledge of precautions;Impaired sensation;Pain;Impaired UE functional use      OT Treatment/Interventions: Self-care/ADL training;Therapeutic exercise;DME and/or AE instruction;Therapeutic activities;Patient/family education;Balance training;Cognitive remediation/compensation;Visual/perceptual remediation/compensation;Neuromuscular education    OT Goals(Current goals can be found in the care plan section) Acute Rehab OT Goals Patient Stated Goal: to feel better OT Goal Formulation: With patient Time For Goal Achievement: 10/11/20 Potential to Achieve Goals: Good  OT Frequency: Min 3X/week   Barriers to D/C:            Co-evaluation              AM-PAC OT "6 Clicks" Daily Activity     Outcome Measure Help from another person eating meals?: Total Help from another person taking care of personal grooming?: A Little Help from another person toileting, which includes using toliet, bedpan, or urinal?: A Lot Help from another person bathing (including washing, rinsing, drying)?: A Little Help from another person to put on and taking off regular upper body clothing?: A Little Help from another person to put on and taking off regular lower body clothing?: A Little 6 Click Score: 15   End of Session Nurse Communication: Mobility status  Activity Tolerance: Patient tolerated treatment well Patient left: in bed;with call bell/phone within  reach;with bed alarm set  OT Visit Diagnosis: Other abnormalities of gait and mobility (R26.89);Muscle weakness (generalized) (M62.81);History of falling (Z91.81);Pain;Dizziness and giddiness (R42);Other symptoms and signs involving the nervous system (R29.898) Pain - part of body:  (chest, head, stomach)                Time:  4098-1191 OT Time Calculation (min): 28 min Charges:  OT General Charges $OT Visit: 1 Visit OT Evaluation $OT Eval Moderate Complexity: 1 Mod OT Treatments $Self Care/Home Management : 8-22 mins  Barry Brunner, OT Acute Rehabilitation Services Pager [phone] Office [phone]   Chancy Milroy 09/27/2020, 10:04 AM

## 2020-09-27 NOTE — Progress Notes (Signed)
Physical Therapy Treatment Patient Details Name: Alejandro Perez MRN: [id number] DOB: Mar 20, 1976 Today's Date: 09/27/2020    History of Present Illness The pt is a 44 yo male presenting with weakness, SOB, and slurred speech in addition to chest, head, and throat pain. Pt at [facility] hospital yesterday for stroke, left AMA as he was unsatisfied with his care. MRI from [place] showed moderate to large subacute cortical and subcortical infarct within the left MCA vascular territory, subacute infarct in the right corona radiata, lacunar infarcts.     PT Comments    Pt received in bed, his wife, Kennyth Arnold present in room. Pt required supervision bed mobility and supervision sitting EOB, fair sitting balance. Pt declining OOB activities, stating "I'm just not doing that today." Pt presents with decreased safety awareness and decreased awareness of current deficits. Kennyth Arnold reports she is able to provide needed level of assist when pt discharges home. Pt/wife report no DME needs. Pt agreeable to ambulate with therapy tomorrow. Pt is scheduled for TEE tomorrow with tentative plan to d/c home following procedure.     Follow Up Recommendations  Outpatient PT;Supervision for mobility/OOB     Equipment Recommendations  None recommended by PT    Recommendations for Other Services       Precautions / Restrictions Precautions Precautions: Fall;Other (comment) Precaution Comments: legally blind     Mobility  Bed Mobility Overal bed mobility: Needs Assistance Bed Mobility: Supine to Sit;Sit to Supine     Supine to sit: Supervision;HOB elevated Sit to supine: Supervision;HOB elevated   General bed mobility comments: +rail, supervision for safety  Transfers                 General transfer comment: Pt declining progress beyond EOB. "I'm just not doing that today."  Ambulation/Gait                 Stairs             Wheelchair Mobility    Modified Rankin (Stroke Patients  Only) Modified Rankin (Stroke Patients Only) Pre-Morbid Rankin Score: No symptoms Modified Rankin: Moderately severe disability     Balance   Sitting-balance support: Feet supported;No upper extremity supported Sitting balance-Leahy Scale: Fair Sitting balance - Comments: supervision for safety, no UE support needed                                    Cognition Arousal/Alertness: Awake/alert Behavior During Therapy: Anxious Overall Cognitive Status: Impaired/Different from baseline Area of Impairment: Safety/judgement;Problem solving                         Safety/Judgement: Decreased awareness of safety;Decreased awareness of deficits   Problem Solving: Difficulty sequencing;Requires verbal cues        Exercises      General Comments General comments (skin integrity, edema, etc.): Pt without c/o dizziness. Pt's wife, Kennyth Arnold, present in room.      Pertinent Vitals/Pain Pain Assessment: Faces Faces Pain Scale: Hurts a little bit Pain Location: stomach Pain Descriptors / Indicators: Discomfort Pain Intervention(s): Monitored during session;Limited activity within patient's tolerance    Home Living       Type of Home: Mobile home              Prior Function            PT Goals (current goals can now be found in  the care plan section) Acute Rehab PT Goals Patient Stated Goal: home Progress towards PT goals: Progressing toward goals    Frequency    Min 3X/week      PT Plan Current plan remains appropriate    Co-evaluation              AM-PAC PT "6 Clicks" Mobility   Outcome Measure  Help needed turning from your back to your side while in a flat bed without using bedrails?: None Help needed moving from lying on your back to sitting on the side of a flat bed without using bedrails?: None Help needed moving to and from a bed to a chair (including a wheelchair)?: A Little Help needed standing up from a chair using your  arms (e.g., wheelchair or bedside chair)?: A Little Help needed to walk in hospital room?: A Little Help needed climbing 3-5 steps with a railing? : A Little 6 Click Score: 20    End of Session   Activity Tolerance: Other (comment) (Pt declining OOB activity.) Patient left: in bed;with call bell/phone within reach;with bed alarm set;with family/visitor present Nurse Communication: Mobility status PT Visit Diagnosis: Other abnormalities of gait and mobility (R26.89);Muscle weakness (generalized) (M62.81);Pain     Time: 2778-2423 PT Time Calculation (min) (ACUTE ONLY): 13 min  Charges:  $Therapeutic Activity: 8-22 mins                     Aida Raider, PT  Office # [phone] Pager [phone]    Ilda Foil 09/27/2020, 1:17 PM

## 2020-09-27 NOTE — Anesthesia Preprocedure Evaluation (Addendum)
Anesthesia Evaluation  Patient identified by MRN, date of birth, ID band Patient awake    Reviewed: Allergy & Precautions, NPO status , Patient's Chart, lab work & pertinent test results  Airway Mallampati: II  TM Distance: >3 FB Neck ROM: Full    Dental no notable dental hx. (+) Teeth Intact, Dental Advisory Given   Pulmonary neg pulmonary ROS, former smoker,    Pulmonary exam normal breath sounds clear to auscultation       Cardiovascular Exercise Tolerance: Good hypertension, Pt. on medications and Pt. on home beta blockers Normal cardiovascular exam Rhythm:Regular Rate:Normal  11/5 TTE 1. Left ventricular ejection fraction, by estimation, is 55 to 60%. The  left ventricle has normal function. The left ventricle has no regional  wall motion abnormalities. There is severe left ventricular hypertrophy.  Left ventricular diastolic parameters  are indeterminate.    Neuro/Psych Expressive aphasia CVA, Residual Symptoms negative psych ROS   GI/Hepatic negative GI ROS, Neg liver ROS,   Endo/Other  negative endocrine ROS  Renal/GU Renal InsufficiencyRenal diseaseK+ 3.9 Cr 1.49     Musculoskeletal negative musculoskeletal ROS (+)   Abdominal   Peds  Hematology   Anesthesia Other Findings   Reproductive/Obstetrics                           Anesthesia Physical Anesthesia Plan  ASA: III  Anesthesia Plan: General   Post-op Pain Management:    Induction:   PONV Risk Score and Plan: Treatment may vary due to age or medical condition  Airway Management Planned: Nasal Cannula and Natural Airway  Additional Equipment: None  Intra-op Plan:   Post-operative Plan:   Informed Consent: I have reviewed the patients History and Physical, chart, labs and discussed the procedure including the risks, benefits and alternatives for the proposed anesthesia with the patient or authorized representative  who has indicated his/her understanding and acceptance.     Dental advisory given  Plan Discussed with: CRNA and Anesthesiologist  Anesthesia Plan Comments: (TEE for CVA)       Anesthesia Quick Evaluation

## 2020-09-27 NOTE — Progress Notes (Signed)
PROGRESS NOTE                                                                             PROGRESS NOTE                                                                                                                                                                                                             Patient Demographics:    Alejandro Perez, is a 44 y.o. adult, DOB - 1976-07-27, ZOX:[id number]  Outpatient Primary MD for the patient is Patient, No Pcp Per    LOS - 3  Admit date - 09/23/2020    Chief Complaint  Patient presents with  . Weakness       Brief Narrative   HPI: Alejandro Perez is a 44 y.o. male with medical history significant of acquired TTP status post splenectomy, hypertension, pulmonary hypertension, HFpEF, CKD presented with known CVA that he was being treated for at [facility], but left AMA after he was unsatisfied with his care.  Patient states that on Sunday he woke up with dizziness, falling to his left, feeling weak, and speech difficulty consisting of slurred speech with his daughter.  Patient had initial work-up at [facility] and had been there for several days.  MRI from [place] showed moderate to large subacute cortical and subcortical infarct within the left MCA vascular territory, subacute infarct in the right corona radiata, lacunar infarcts.  Patient states that he had a fall while there and landed on his face with his wheelchair landed on top of him but was unable to get help.  And that is why he left and came to [facility].  He was accompanied with some records but it is unclear what the results of his full work-up was.  Patient reports some chest tightness, chronic abdominal pain, headache.  He denies constipation or diarrhea  ED Course: Vital significant for hypertension in the ED here.  Labs showed creatinine of 1.5 stable for his baseline.  LFTs within normal limits.  CBC showed  hemoglobin of 17.  PT INR and  PTT within normal limits.  Initial troponin XVIII, second pending.  Respiratory viral panel pending when seen.  EKG showed normal sinus rhythm with LVH and QTC prolongation at 45.   Subjective:    [name] today denies any headache, worsening symptoms, he does report some intermittent dyspnea, as well he is reporting some anxiety.   Assessment  & Plan :    Principal Problem:   CVA (cerebral vascular accident) (HCC) Active Problems:   Hypertension   CKD (chronic kidney disease)   (HFpEF) heart failure with preserved ejection fraction (HCC)    Acute CVA - L MCA infarct embolic secondary to uncontrolled risk factor with HTN, HLD and cocaine use.  -Lower extremity Dopplers with no DVT, transcranial Dopplers with no RT L shunting, 2D echo with a preserved EF 55 to 60%. -Suspicion for embolic stroke, continue with telemetry monitoring, neurology recommend outpatient cardiac event monitoring to rule out A. Fib.. -Discussed with neurology, concern for embolic stroke, they do recommend consideration for TEE, I have discussed with cardiology, it is evidence for tomorrow. -Now on aspirin and Plavix for 3 weeks, then aspirin alone.. -PT/OT/SLP consulted.  Commendation for outpatient PT/OT. -No evidence of A. fib on telemetry monitor. -During hospital stay patient continues to complains of various unrelated symptoms including headache, shortness of breath, anxiety, restlessness, he does not appear to be having some anxiety issues, work-up including repeat CT head, chest x-ray, with no acute finding.  HTN - Resume home clonidine, amlodipine, metoprolol, Lasix, his heart rate on the lower side, so I did decrease his beta-blockers and started on hydrochlorothiazide instead.  HFpEF - Resume home metoprolol, Lasix  AKI on CKD -creatinine peaked at 1.8, possible contrast nephropathy, improving with IV fluids.  -Avoid nephrotoxic medications.  Hyperlipidemia -Started on statin  Head  Ache/Chronic Pain  -Continue with home medications.  Polysubstance use > Patient has a history of marijuana and recent trial of ecstasy.  He denies cocaine but has had positive cocaine on his UDS is at other hospitalizations. - Continue to monitor  Dyspnea -Most likely related to anxiety, chest x-ray clear, no evidence of volume overload on physical exam as well.  He is saturating 100% on room air with work of breathing, clear lungs.  SpO2: 100 %  Recent Labs  Lab 09/23/20 1407 09/23/20 2341 09/24/20 0238 09/25/20 1807 09/26/20 0500  WBC 6.7  --  6.9 7.1 6.1  PLT 315  --  301 350 319  BNP  --   --   --  411.2* 346.3*  AST 21  --   --   --   --   ALT 16  --   --   --   --   ALKPHOS 84  --   --   --   --   BILITOT 0.8  --   --   --   --   ALBUMIN 4.3  --   --   --   --   INR 1.0  --   --   --   --   SARSCOV2NAA  --  NEGATIVE  --   --   --        ABG     Component Value Date/Time   PHART 7.358 10/13/2019 0426   PCO2ART 54.3 (H) 10/13/2019 0426   PO2ART 59.3 (L) 10/13/2019 0426   HCO3 29.8 (H) 10/13/2019 0426   O2SAT 88.2 10/13/2019 0426  Condition - Extremely Guarded  Family Communication  :  none at bedside  Code Status :  Full  Consults  :  Neurology  Procedures  :  none  Disposition Plan  :    Status is: inpatient  The patient will require care spanning > 2 midnights and should be moved to inpatient because: Hemodynamically unstable and Ongoing diagnostic testing needed not appropriate for outpatient work up  Dispo: The patient is from: Home              Anticipated d/c is to: Home              Anticipated d/c date is: 1 day              Patient currently is not medically stable to d/c.  To be discharged tomorrow after TEE.     DVT Prophylaxis  :  Lovenox   Lab Results  Component Value Date   PLT 319 09/26/2020    Diet :  Diet Order            Diet Heart Room service appropriate? Yes; Fluid consistency: Thin  Diet effective now                   Inpatient Medications  Scheduled Meds: . amLODipine  10 mg Oral Daily  . aspirin EC  81 mg Oral Daily  . atorvastatin  40 mg Oral Daily  . cloNIDine  0.2 mg Oral BID  . clopidogrel  75 mg Oral Daily  . enoxaparin (LOVENOX) injection  40 mg Subcutaneous Q24H  . folic acid  1 mg Oral Daily  . hydrochlorothiazide  12.5 mg Oral Daily  . metoprolol tartrate  12.5 mg Oral BID  . pantoprazole  40 mg Oral Daily  . sodium chloride flush  3 mL Intravenous Once  . sodium chloride flush  3 mL Intravenous Q12H  . cyanocobalamin  1,000 mcg Oral Daily   Continuous Infusions: . lactated ringers 100 mL/hr at 09/27/20 0600   PRN Meds:.acetaminophen **OR** acetaminophen, albuterol, ALPRAZolam, alum & mag hydroxide-simeth, diphenhydrAMINE, HYDROmorphone (DILAUDID) injection, oxyCODONE, polyethylene glycol  Antibiotics  :    Anti-infectives (From admission, onward)   None      Huey Bienenstock M.D on 09/27/2020 at 11:24 AM  To page go to [email or web address]   [facility] -  Office  [phone]     Objective:   Vitals:   09/27/20 0011 09/27/20 0410 09/27/20 0500 09/27/20 0923  BP: (!) 134/53 136/80  (!) 146/91  Pulse: (!) 51 (!) 47  (!) 53  Resp: Temp: 97.9 F (36.6 C) (!) 97.5 F (36.4 C)  (!) 97.4 F (36.3 C)  TempSrc: Oral Oral  Oral  SpO2: 100% 100%  100%  Weight:   71 kg   Height:        Wt Readings from Last 3 Encounters:  09/27/20 71 kg  10/09/19 73.5 kg  12/21/17 71.2 kg     Intake/Output Summary (Last 24 hours) at 09/27/2020 1124 Last data filed at 09/27/2020 0751 Gross per 24 hour  Intake 2029.66 ml  Output 1275 ml  Net 754.66 ml     Physical Exam  Awake Alert, Oriented X 3, patient with poor vision ( patient report this is his baseline), his speech has been improving. Symmetrical Chest wall movement, Good air movement bilaterally, CTAB RRR,No Gallops,Rubs or new Murmurs, No Parasternal Heave +ve B.Sounds, Abd Soft, No  tenderness, No rebound -  guarding or rigidity. No Cyanosis, Clubbing or edema, No new Rash or bruise      Data Review:    CBC Recent Labs  Lab 09/23/20 1407 09/24/20 0238 09/25/20 1807 09/26/20 0500  WBC 6.7 6.9 7.1 6.1  HGB 17.9* 16.3 17.5* 15.7  HCT 49.6 45.0 47.2 RESULTS UNAVAILABLE DUE TO INTERFERING SUBSTANCE  PLT 315 301 350 319  MCV 86.6 88.2 83.7 RESULTS UNAVAILABLE DUE TO INTERFERING SUBSTANCE  MCH 31.2 32.0 31.0 RESULTS UNAVAILABLE DUE TO INTERFERING SUBSTANCE  MCHC 36.1* 36.2* 37.1* RESULTS UNAVAILABLE DUE TO INTERFERING SUBSTANCE  RDW 13.0 12.9 12.4 RESULTS UNAVAILABLE DUE TO INTERFERING SUBSTANCE  LYMPHSABS 1.3  --   --   --   MONOABS 0.9  --   --   --   EOSABS 0.5  --   --   --   BASOSABS 0.1  --   --   --     Recent Labs  Lab 09/23/20 1407 09/24/20 0238 09/24/20 1011 09/25/20 0226 09/25/20 1807 09/26/20 0500  NA 138 136  --  135  --  135  K 4.2 4.1  --  4.8  --  3.5  CL 100 101  --  97*  --  98  CO2 25 22  --  22  --  26  GLUCOSE 101* 95  --  66*  --  102*  BUN 11 12  --  20  --  15  CREATININE 1.54* 1.61*  --  1.85*  --  1.60*  CALCIUM 9.8 9.2  --  9.3  --  8.9  AST 21  --   --   --   --   --   ALT 16  --   --   --   --   --   ALKPHOS 84  --   --   --   --   --   BILITOT 0.8  --   --   --   --   --   ALBUMIN 4.3  --   --   --   --   --   INR 1.0  --   --   --   --   --   TSH  --   --  1.938  --   --   --   HGBA1C  --  4.8  --   --   --   --   BNP  --   --   --   --  411.2* 346.3*    ------------------------------------------------------------------------------------------------------------------ No results for input(s): CHOL, HDL, LDLCALC, TRIG, CHOLHDL, LDLDIRECT in the last 72 hours.  Lab Results  Component Value Date   HGBA1C 4.8 09/24/2020   ------------------------------------------------------------------------------------------------------------------ No results for input(s): TSH, T4TOTAL, T3FREE, THYROIDAB in the last 72  hours.  Invalid input(s): FREET3  Cardiac Enzymes No results for input(s): CKMB, TROPONINI, MYOGLOBIN in the last 168 hours.  Invalid input(s): CK ------------------------------------------------------------------------------------------------------------------    Component Value Date/Time   BNP 346.3 (H) 09/26/2020 0500    Micro Results Recent Results (from the past 240 hour(s))  Respiratory Panel by RT PCR (Flu A&B, Covid) - Nasopharyngeal Swab     Status: None   Collection Time: 09/23/20 11:41 PM   Specimen: Nasopharyngeal Swab  Result Value Ref Range Status   SARS Coronavirus 2 by RT PCR NEGATIVE NEGATIVE Final    Comment: (NOTE) SARS-CoV-2 target nucleic acids are NOT DETECTED.  The SARS-CoV-2 RNA is generally detectable in upper respiratoy specimens during  the acute phase of infection. The lowest concentration of SARS-CoV-2 viral copies this assay can detect is 131 copies/mL. A negative result does not preclude SARS-Cov-2 infection and should not be used as the sole basis for treatment or other patient management decisions. A negative result may occur with  improper specimen collection/handling, submission of specimen other than nasopharyngeal swab, presence of viral mutation(s) within the areas targeted by this assay, and inadequate number of viral copies (<131 copies/mL). A negative result must be combined with clinical observations, patient history, and epidemiological information. The expected result is Negative.  Fact Sheet for Patients:  https://www.moore.com/  Fact Sheet for Healthcare Providers:  https://www.young.biz/  This test is no t yet approved or cleared by the [place] FDA and  has been authorized for detection and/or diagnosis of SARS-CoV-2 by FDA under an Emergency Use Authorization (EUA). This EUA will remain  in effect (meaning this test can be used) for the duration of the COVID-19 declaration under  Section 564(b)(1) of the Act, 21 U.S.C. section 360bbb-3(b)(1), unless the authorization is terminated or revoked sooner.     Influenza A by PCR NEGATIVE NEGATIVE Final   Influenza B by PCR NEGATIVE NEGATIVE Final    Comment: (NOTE) The Xpert Xpress SARS-CoV-2/FLU/RSV assay is intended as an aid in  the diagnosis of influenza from Nasopharyngeal swab specimens and  should not be used as a sole basis for treatment. Nasal washings and  aspirates are unacceptable for Xpert Xpress SARS-CoV-2/FLU/RSV  testing.  Fact Sheet for Patients: https://www.moore.com/  Fact Sheet for Healthcare Providers: https://www.young.biz/  This test is not yet approved or cleared by the [place] FDA and  has been authorized for detection and/or diagnosis of SARS-CoV-2 by  FDA under an Emergency Use Authorization (EUA). This EUA will remain  in effect (meaning this test can be used) for the duration of the  Covid-19 declaration under Section 564(b)(1) of the Act, 21  U.S.C. section 360bbb-3(b)(1), unless the authorization is  terminated or revoked. Performed at [facility], [address] [place], [place] [address]     Radiology Reports CT HEAD WO CONTRAST  Result Date: 09/25/2020 CLINICAL DATA:  Stroke.  Worsening headache. EXAM: CT HEAD WITHOUT CONTRAST TECHNIQUE: Contiguous axial images were obtained from the base of the skull through the vertex without intravenous contrast. COMPARISON:  CT head 09/24/2020 FINDINGS: Brain: Hypodensity in the left temporoparietal lobe unchanged. No acute hemorrhage identified. Subtle hypodensity right corona radiata may represent a recent infarct. Small hypodensity in the left posterior insula and external capsule unchanged consistent with chronic infarct. Chronic infarct right thalamus unchanged. Ventricle size normal without midline shift. No acute hemorrhage. Vascular: Negative for hyperdense vessel Skull: Negative  Sinuses/Orbits: Mild mucosal edema left maxillary sinus. Negative orbit Other: None IMPRESSION: Acute infarct left temporoparietal lobe unchanged. No associated hemorrhage. No change from the prior CT. Electronically Signed   By: Marlan Palau M.D.   On: 09/25/2020 17:08   CT HEAD WO CONTRAST  Result Date: 09/24/2020 CLINICAL DATA:  Severe headache.  History of stroke. EXAM: CT HEAD WITHOUT CONTRAST TECHNIQUE: Contiguous axial images were obtained from the base of the skull through the vertex without intravenous contrast. COMPARISON:  Yesterday FINDINGS: Brain: More well-defined cytotoxic edema in the left frontal parietal convexity, expected. Subacute infarct at the right corona radiata is unchanged. Chronic lacunar infarct at the right thalamus and left corona radiata. No acute hemorrhage, hydrocephalus, or significant mass effect. Vascular: No hyperdense vessel or unexpected calcification. Skull:  Remote nasal bone fractures. Sinuses/Orbits: No acute finding. IMPRESSION: 1. No new abnormality. 2. Acute left MCA branch infarct. 3. Subacute right corona radiata infarct. 4. Chronic lacunar infarcts. Electronically Signed   By: Marnee Spring M.D.   On: 09/24/2020 06:07   CT HEAD WO CONTRAST  Result Date: 09/23/2020 CLINICAL DATA:  Neuro deficit, acute stroke suspected. EXAM: CT HEAD WITHOUT CONTRAST TECHNIQUE: Contiguous axial images were obtained from the base of the skull through the vertex without intravenous contrast. COMPARISON:  Noncontrast head CT 09/20/2020. CT angiogram head/neck 09/20/2020. Noncontrast head CT 09/21/2020. Brain MRI 09/22/2020. FINDINGS: Brain: Cerebral volume is normal. A moderate to large acute/early subacute cortical and subcortical infarct within the left parietal lobe, posterosuperior left temporal lobe and posterior left insula has not significantly changed in extent as compared to the brain MRI performed one day prior. Subtle petechial hemorrhage is questioned, but there is  no frank hemorrhagic conversion. Regional mass effect. No midline shift A small subacute infarct within the right corona radiata was better appreciated on the prior MRI. Redemonstrated chronic lacunar infarcts within the bilateral cerebral white matter, left basal ganglia and right thalamus. No extra-axial fluid collection. No evidence of intracranial mass. No midline shift. Vascular: Atherosclerotic calcifications. Skull: Normal. Negative for fracture or focal lesion. Sinuses/Orbits: Visualized orbits show no acute finding. Mild paranasal sinus mucosal thickening, most notably ethmoidal. Other: There are acute, comminuted and displaced bilateral nasal bone fractures, new as compared to prior examinations. Partial opacification of the right nasal passage. Suspected additional minimally displaced acute fractures of the bony nasal septum (for instance as seen on series 4, images 16 and 17). IMPRESSION: A moderate to large acute/early subacute cortical and subcortical infarct within the left MCA vascular territory has not significantly changed in extent as compared to the brain MRI performed one day prior. There is regional mass effect but no midline shift. Subtle petechial hemorrhage is questioned, but there is no frank hemorrhagic conversion. A small subacute infarct within the right corona radiata was better appreciated on the prior MRI. Redemonstrated chronic lacunar infarcts within the bilateral cerebral white matter, left basal ganglia and right thalamus. Acute, comminuted and displaced bilateral nasal bone fractures which are new as compared to the prior examinations. Suspected minimally displaced acute fractures of the bony nasal septum. Partial opacification of the right nasal passage. Consider dedicated maxillofacial CT for further evaluation. Electronically Signed   By: Jackey Loge DO   On: 09/23/2020 19:23   DG Chest Port 1 View  Result Date: 09/25/2020 CLINICAL DATA:  Dyspnea EXAM: PORTABLE CHEST 1  VIEW COMPARISON:  09/23/2020 chest radiograph. FINDINGS: Stable cardiomediastinal silhouette with normal heart size. No pneumothorax. No pleural effusion. Lungs appear clear, with no acute consolidative airspace disease and no pulmonary edema. IMPRESSION: No active disease. Electronically Signed   By: Delbert Phenix M.D.   On: 09/25/2020 07:59   DG Chest Portable 1 View  Result Date: 09/23/2020 CLINICAL DATA:  Chest pain EXAM: PORTABLE CHEST 1 VIEW COMPARISON:  September 21, 2020 FINDINGS: The heart size and mediastinal contours are within normal limits. Both lungs are clear. The visualized skeletal structures are unremarkable. IMPRESSION: No active disease. Electronically Signed   By: Jonna Clark M.D.   On: 09/23/2020 16:49   VAS [place] TRANSCRANIAL DOPPLER W BUBBLES  Result Date: 09/24/2020  Transcranial Doppler with Bubble Indications: Stroke. Comparison Study: No prior study Performing Technologist: Gertie Fey MHA, RDMS, RVT, RDCS  Examination Guidelines: A complete evaluation includes B-mode imaging, spectral Doppler,  color Doppler, and power Doppler as needed of all accessible portions of each vessel. Bilateral testing is considered an integral part of a complete examination. Limited examinations for reoccurring indications may be performed as noted.  Summary: No HITS at rest or during Valsalva. Negative transcranial Doppler Bubble study with no evidence of right to left intracardiac communication.  A vascular evaluation was performed. The right middle cerebral artery was studied. An IV was inserted into the patient's right forearm. Verbal informed consent was obtained.  *See table(s) above for TCD measurements and observations.    Preliminary    ECHOCARDIOGRAM COMPLETE  Result Date: 09/24/2020    ECHOCARDIOGRAM REPORT   Patient Name:   EMERICK WEATHERLY Date of Exam: 09/24/2020 Medical Rec #:  [id number]    Height:       74.0 in Accession #:    [id number]   Weight:       159.3 lb Date of Birth:   10-29-1976   BSA:          1.973 m Patient Age:    44 years     BP:           155/87 mmHg Patient Gender: M            HR:           53 bpm. Exam Location:  Inpatient Procedure: 2D Echo, Color Doppler, Cardiac Doppler and Strain Analysis Indications:    Stroke i163.9  History:        Patient has prior history of Echocardiogram examinations, most                 recent 12/21/2017. Risk Factors:Hypertension.  Sonographer:    Irving Burton Senior RDCS Referring Phys: [id number] Scheryl Marten XU IMPRESSIONS  1. Left ventricular ejection fraction, by estimation, is 55 to 60%. The left ventricle has normal function. The left ventricle has no regional wall motion abnormalities. There is severe left ventricular hypertrophy. Left ventricular diastolic parameters  are indeterminate.  2. Right ventricular systolic function is normal. The right ventricular size is normal.  3. Left atrial size was mild to moderately dilated.  4. Mild mitral valve regurgitation.  5. 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 Aortic valve regurgitation is mild to moderate. Mild aortic valve sclerosis is present, with no evidence of aortic valve stenosis.  6. The inferior vena cava is normal in size with greater than 50% respiratory variability, suggesting right atrial pressure of 3 mmHg. FINDINGS  Left Ventricle: Left ventricular ejection fraction, by estimation, is 55 to 60%. The left ventricle has normal function. The left ventricle has no regional wall motion abnormalities. The left ventricular internal cavity size was normal in size. There is  severe left ventricular hypertrophy. Left ventricular diastolic parameters are indeterminate. Right Ventricle: The right ventricular size is normal. Right vetricular wall thickness was not assessed. Right ventricular systolic function is normal. Left Atrium: Left atrial size was mild to moderately dilated. Right Atrium: Right atrial size was normal in size. Pericardium:  There is no evidence of pericardial effusion. Mitral Valve: The mitral valve is abnormal. There is mild thickening of the mitral valve leaflet(s). Mild mitral annular calcification. Mild mitral valve regurgitation. Tricuspid Valve: The tricuspid valve is normal in structure. Tricuspid valve regurgitation is trivial. 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 The aortic valve is abnormal. Aortic valve regurgitation is mild to moderate. Aortic regurgitation PHT measures 897 msec. Mild aortic valve sclerosis is  present, with no evidence of aortic valve stenosis. Pulmonic Valve: The pulmonic valve was grossly normal. Pulmonic valve regurgitation is not visualized. Aorta: The aortic root and ascending aorta are structurally normal, with no evidence of dilitation. Venous: The inferior vena cava is normal in size with greater than 50% respiratory variability, suggesting right atrial pressure of 3 mmHg. IAS/Shunts: No atrial level shunt detected by color flow Doppler.  LEFT VENTRICLE PLAX 2D LVIDd:         4.30 cm  Diastology LVIDs:         2.90 cm  LV e' medial:    5.22 cm/s LV PW:         1.50 cm  LV E/e' medial:  11.9 LV IVS:        1.80 cm  LV e' lateral:   5.11 cm/s LVOT diam:     2.10 cm  LV E/e' lateral: 12.2 LV SV:         87 LV SV Index:   44 LVOT Area:     3.46 cm  RIGHT VENTRICLE RV S prime:     11.00 cm/s TAPSE (M-mode): 2.3 cm LEFT ATRIUM              Index       RIGHT ATRIUM           Index LA diam:        4.50 cm  2.28 cm/m  RA Area:     14.10 cm LA Vol (A2C):   112.0 ml 56.76 ml/m RA Volume:   29.70 ml  15.05 ml/m LA Vol (A4C):   76.1 ml  38.57 ml/m LA Biplane Vol: 92.1 ml  46.68 ml/m  AORTIC VALVE LVOT Vmax:   111.00 cm/s LVOT Vmean:  71.700 cm/s LVOT VTI:    0.252 m AI PHT:      897 msec  AORTA Ao Root diam: 3.70 cm Ao Asc diam:  3.50 cm MITRAL VALVE MV Area (PHT): 2.62 cm    SHUNTS MV Decel Time: 290 msec    Systemic VTI:  0.25 m MV E velocity: 62.20 cm/s   Systemic Diam: 2.10 cm MV A velocity: 48.00 cm/s MV E/A ratio:  1.30 Dietrich Pates MD Electronically signed by Dietrich Pates MD Signature Date/Time: 09/24/2020/3:44:05 PM    Final    CT Angio Chest/Abd/Pel for Dissection W and/or Wo Contrast  Result Date: 09/23/2020 CLINICAL DATA:  Abdominal pain, question aortic dissection EXAM: CT ANGIOGRAPHY CHEST, ABDOMEN AND PELVIS TECHNIQUE: Non-contrast CT of the chest was initially obtained. Multidetector CT imaging through the chest, abdomen and pelvis was performed using the standard protocol during bolus administration of intravenous contrast. Multiplanar reconstructed images and MIPs were obtained and reviewed to evaluate the vascular anatomy. CONTRAST:  OMNIPAQUE IOHEXOL 350 MG/ML SOLN COMPARISON:  None. FINDINGS: CTA CHEST FINDINGS Cardiovascular: --Heart: The heart size is normal.  There is nopericardial effusion. --Aorta: The course and caliber of the thoracic aorta are normal. There is no aortic atherosclerotic calcification. Precontrast images show no aortic intramural hematoma. There is no blood pool, dissection or penetrating ulcer demonstrated on arterial phase postcontrast imaging. There is a origin of the left carotid artery the brachiocephalic trunk. Minimal calcifications seen at the origin of brachiocephalic trunk. The proximal arch vessels are widely patent. --Pulmonary Arteries: Contrast timing is optimized for preferential opacification of the aorta. Within that limitation, normal central pulmonary arteries. Mediastinum/Nodes: No mediastinal, hilar or axillary lymphadenopathy. The visualized thyroid and thoracic esophageal course are  unremarkable. Lungs/Pleura: Mild ground-glass opacity seen at the posterior right lung base. There is a calcified granuloma seen in the right middle lobe. No focal airspace consolidation. No focal pleural abnormality. Musculoskeletal: No chest wall abnormality. No acute osseous findings. Review of the MIP images confirms  the above findings. CTA ABDOMEN AND PELVIS FINDINGS VASCULAR Aorta: Normal caliber aorta without aneurysm, dissection, vasculitis or hemodynamically significant stenosis. There is scattered mild aortic atherosclerosis at the aorta bi-iliac bifurcation. Celiac: No aneurysm, dissection or hemodynamically significant stenosis. Normal branching pattern SMA: Widely patent without dissection or stenosis. Renals: Single renal arteries bilaterally. No aneurysm, dissection, stenosis or evidence of fibromuscular dysplasia. IMA: Patent without abnormality. Inflow: Scattered atherosclerosis at the common iliac vasculature. Veins: Normal course and caliber of the major veins. Assessment is otherwise limited by the arterial dominant contrast phase. Review of the MIP images confirms the above findings. NON-VASCULAR Hepatobiliary: Normal hepatic contours and density. No visible biliary dilatation. The patient is status post cholecystectomy. No biliary ductal dilation. Pancreas: Normal contours without ductal dilatation. No peripancreatic fluid collection. 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 Adrenals/Urinary Tract: --Adrenal glands: Normal. --Right kidney/ureter: No hydronephrosis or perinephric stranding. No nephrolithiasis. No obstructing ureteral stones. --Left kidney/ureter: No hydronephrosis or perinephric stranding. No nephrolithiasis. No obstructing ureteral stones. --Urinary bladder: Unremarkable. Stomach/Bowel: --Stomach/Duodenum: No hiatal hernia or other gastric abnormality. Normal duodenal course and caliber. --Small bowel: No dilatation or inflammation. --Colon: No focal abnormality. --Appendix: Normal. Lymphatic:  No abdominal or pelvic lymphadenopathy. Reproductive: No free fluid in the pelvis. Musculoskeletal. No bony spinal canal stenosis or focal osseous abnormality. Other: None. Review of the MIP images confirms the above findings. IMPRESSION: No acute aortic abnormality. Mild aortic atherosclerosis.   Aortic Atherosclerosis (ICD10-I70.0). No other acute intrathoracic or pelvic pathology. Electronically Signed   By: Jonna ClarkBindu  Avutu M.D.   On: 09/23/2020 19:14   VAS US LOWER EXTREMITY VENOUS (DVT)  Result Date: 09/25/2020  Lower Venous DVT Study Indications: Stroke.  Comparison Study: No prior study Performing Technologist: Gertie FeyMichelle Simonetti MHA, RDMS, RVT, RDCS  Examination Guidelines: A complete evaluation includes B-mode imaging, spectral Doppler, color Doppler, and power Doppler as needed of all accessible portions of each vessel. Bilateral testing is considered an integral part of a complete examination. Limited examinations for reoccurring indications may be performed as noted. The reflux portion of the exam is performed with the patient in reverse Trendelenburg.  +---------+---------------+---------+-----------+----------+--------------+ RIGHT    CompressibilityPhasicitySpontaneityPropertiesThrombus Aging +---------+---------------+---------+-----------+----------+--------------+ CFV      Full           Yes      Yes                                 +---------+---------------+---------+-----------+----------+--------------+ SFJ      Full                                                        +---------+---------------+---------+-----------+----------+--------------+ FV Prox  Full                                                        +---------+---------------+---------+-----------+----------+--------------+  FV Mid   Full                                                        +---------+---------------+---------+-----------+----------+--------------+ FV DistalFull                                                        +---------+---------------+---------+-----------+----------+--------------+ PFV      Full                                                        +---------+---------------+---------+-----------+----------+--------------+ POP      Full            Yes      Yes                                 +---------+---------------+---------+-----------+----------+--------------+ PTV      Full                                                        +---------+---------------+---------+-----------+----------+--------------+ PERO     Full                                                        +---------+---------------+---------+-----------+----------+--------------+   +---------+---------------+---------+-----------+----------+--------------+ LEFT     CompressibilityPhasicitySpontaneityPropertiesThrombus Aging +---------+---------------+---------+-----------+----------+--------------+ CFV      Full           Yes      Yes                                 +---------+---------------+---------+-----------+----------+--------------+ SFJ      Full                                                        +---------+---------------+---------+-----------+----------+--------------+ FV Prox  Full                                                        +---------+---------------+---------+-----------+----------+--------------+ FV Mid   Full                                                        +---------+---------------+---------+-----------+----------+--------------+  FV DistalFull                                                        +---------+---------------+---------+-----------+----------+--------------+ PFV      Full                                                        +---------+---------------+---------+-----------+----------+--------------+ POP      Full           Yes      Yes                                 +---------+---------------+---------+-----------+----------+--------------+ PTV      Full                                                        +---------+---------------+---------+-----------+----------+--------------+ PERO     Full                                                         +---------+---------------+---------+-----------+----------+--------------+     Summary: RIGHT: - There is no evidence of deep vein thrombosis in the lower extremity.  - No cystic structure found in the popliteal fossa.  LEFT: - There is no evidence of deep vein thrombosis in the lower extremity.  - No cystic structure found in the popliteal fossa.  *See table(s) above for measurements and observations. Electronically signed by Lemar Livings MD on 09/25/2020 at 3:31:03 PM.    Final

## 2020-09-27 NOTE — Plan of Care (Signed)
  Problem: Education: Goal: Knowledge of secondary prevention will improve Outcome: Progressing Goal: Knowledge of patient specific risk factors addressed and post discharge goals established will improve Outcome: Progressing   Problem: Self-Care: Goal: Verbalization of feelings and concerns over difficulty with self-care will improve Outcome: Progressing   Problem: Nutrition: Goal: Dietary intake will improve Outcome: Progressing

## 2020-09-27 NOTE — TOC Initial Note (Signed)
Transition of Care [facility]) - Initial/Assessment Note    Patient Details  Name: Alejandro Perez MRN: [id number] Date of Birth: 04-13-1976  Transition [facility] Care [facility]) CM/SW Contact:    Kermit Balo, RN Phone Number: 09/27/2020, 2:37 PM  Clinical Narrative:                 Pt new to the area with out of state medicaid. He states his is going to call DSS and have them change his Medicaid to [place].  Clinics in [place] not willing to take for PcP until medicaid changed. CM has provided him the information on the [facility] clinic in the interim until he gets medicaid changed.  Pt prefers [facility] for outpatient therapy. Order and information faxed to [facility] outpatient.  3 in 1 to be delivered to the room per [employer]. TOC following.  Expected Discharge Plan: OP Rehab Barriers to Discharge: Inadequate or no insurance   Patient Goals and CMS Choice   CMS Medicare.gov Compare Post Acute Care list provided to:: Patient Choice offered to / list presented to : Patient, Spouse  Expected Discharge Plan and Services Expected Discharge Plan: OP Rehab   Discharge Planning Services: CM Consult   Living arrangements for the past 2 months: Single Family Home                 DME Arranged: 3-N-1 DME Agency: [employer] Date DME Agency Contacted: 09/27/20   Representative spoke with at DME Agency: Velna Hatchet            Prior Living Arrangements/Services Living arrangements for the past 2 months: Single Family Home Lives with:: Significant Other Patient language and need for interpreter reviewed:: Yes Do you feel safe going back to the place where you live?: Yes      Need for Family Participation in Patient Care: Yes (Comment) Care giver support system in place?: Yes (comment)   Criminal Activity/Legal Involvement Pertinent to Current Situation/Hospitalization: No - Comment as needed  Activities of Daily Living      Permission Sought/Granted                  Emotional  Assessment Appearance:: Appears stated age Attitude/Demeanor/Rapport: Engaged Affect (typically observed): Accepting Orientation: : Oriented to Self, Oriented to Place, Oriented to  Time, Oriented to Situation   Psych Involvement: No (comment)  Admission diagnosis:  Other chest pain [R07.89] Primary hypertension [I10] CVA (cerebral vascular accident) (HCC) [I63.9] Cerebrovascular accident (CVA), unspecified mechanism (HCC) [I63.9] Patient Active Problem List   Diagnosis Date Noted  . CKD (chronic kidney disease) 09/24/2020  . (HFpEF) heart failure with preserved ejection fraction (HCC) 09/24/2020  . CVA (cerebral vascular accident) (HCC) 09/23/2020  . SOB (shortness of breath)   . 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 Nausea 10/11/2019  . Splenomegaly 10/08/2019  . Vitamin B12 deficiency   . Pulmonary HTN (HCC)   . Chest pain 12/21/2017  . Thrombocytopenia (HCC) 12/21/2017  . Spleen enlarged 12/21/2017  . Schistocytes on peripheral blood smear 12/21/2017  . Hypertension   . Anemia   . Acquired TTP   . Abdominal pain, RUQ    PCP:  Patient, No Pcp Per Pharmacy:   [facility] [address],  - [address] [place] [address] Phone: [phone] Fax: [phone]  [facility] [place], [place] - [address] [place] [address] Phone: [phone] Fax: [phone]     Social  Determinants of Health (SDOH) Interventions    Readmission Risk Interventions No flowsheet data found.

## 2020-09-27 NOTE — Progress Notes (Signed)
   [facility] has been requested to perform a transesophageal echocardiogram on Alejandro Perez for stroke. After careful review of history and examination, the risks and benefits of transesophageal echocardiogram have been explained including risks of esophageal damage, perforation (1:10,000 risk), bleeding, pharyngeal hematoma as well as other potential complications associated with conscious sedation including aspiration, arrhythmia, respiratory failure and death. Alternatives to treatment were discussed, questions were answered. Patient is willing to proceed.   Procedure scheduled for tomorrow at 11:30am with Dr. Mayford Knife.  Corrin Parker, PA-C 09/27/2020 12:26 PM

## 2020-09-28 ENCOUNTER — Inpatient Hospital Stay (HOSPITAL_COMMUNITY): Payer: Medicaid - Out of State | Admitting: Anesthesiology

## 2020-09-28 ENCOUNTER — Inpatient Hospital Stay (HOSPITAL_COMMUNITY): Payer: Medicaid - Out of State

## 2020-09-28 ENCOUNTER — Encounter (HOSPITAL_COMMUNITY)
Admission: EM | Disposition: A | Discharge status: Home / Self Care | Payer: Self-pay | Source: Home / Self Care | Attending: Internal Medicine

## 2020-09-28 ENCOUNTER — Other Ambulatory Visit: Payer: Self-pay | Admitting: Cardiology

## 2020-09-28 ENCOUNTER — Encounter (HOSPITAL_COMMUNITY): Payer: Self-pay | Admitting: Internal Medicine

## 2020-09-28 DIAGNOSIS — I639 Cerebral infarction, unspecified: Secondary | ICD-10-CM

## 2020-09-28 DIAGNOSIS — I361 Nonrheumatic tricuspid (valve) insufficiency: Secondary | ICD-10-CM

## 2020-09-28 DIAGNOSIS — I340 Nonrheumatic mitral (valve) insufficiency: Secondary | ICD-10-CM

## 2020-09-28 HISTORY — PX: BUBBLE STUDY: SHX6837

## 2020-09-28 HISTORY — PX: TEE WITHOUT CARDIOVERSION: SHX5443

## 2020-09-28 LAB — ECHO TEE: P 1/2 time: 606 msec

## 2020-09-28 SURGERY — ECHOCARDIOGRAM, TRANSESOPHAGEAL
Anesthesia: General

## 2020-09-28 MED ORDER — CLONIDINE HCL 0.2 MG PO TABS
0.2000 mg | ORAL_TABLET | Freq: Two times a day (BID) | ORAL | 0 refills | Status: DC
Start: 2020-09-28 — End: 2020-09-28

## 2020-09-28 MED ORDER — ATORVASTATIN CALCIUM 40 MG PO TABS
40.0000 mg | ORAL_TABLET | Freq: Every day | ORAL | 0 refills | Status: DC
Start: 2020-09-28 — End: 2020-09-28

## 2020-09-28 MED ORDER — AMLODIPINE BESYLATE 10 MG PO TABS
10.0000 mg | ORAL_TABLET | Freq: Every day | ORAL | 0 refills | Status: DC
Start: 2020-09-28 — End: 2020-09-28

## 2020-09-28 MED ORDER — SODIUM CHLORIDE 0.9 % IV SOLN: INTRAVENOUS | Status: DC

## 2020-09-28 MED ORDER — HYDROCHLOROTHIAZIDE 12.5 MG PO CAPS
12.5000 mg | ORAL_CAPSULE | Freq: Every day | ORAL | 0 refills | Status: DC
Start: 2020-09-28 — End: 2020-10-15

## 2020-09-28 MED ORDER — FUROSEMIDE 40 MG PO TABS
40.0000 mg | ORAL_TABLET | Freq: Every day | ORAL | 0 refills | Status: DC
Start: 2020-09-28 — End: 2020-10-13

## 2020-09-28 MED ORDER — PROPOFOL 500 MG/50ML IV EMUL
INTRAVENOUS | Status: DC | PRN
  Administered 2020-09-28: 150 ug/kg/min via INTRAVENOUS

## 2020-09-28 MED ORDER — CLOPIDOGREL BISULFATE 75 MG PO TABS
75.0000 mg | ORAL_TABLET | Freq: Every day | ORAL | 0 refills | Status: DC
Start: 2020-09-28 — End: 2020-10-13

## 2020-09-28 MED ORDER — PANTOPRAZOLE SODIUM 40 MG PO TBEC
40.0000 mg | DELAYED_RELEASE_TABLET | Freq: Every day | ORAL | 0 refills | Status: DC
Start: 2020-09-29 — End: 2020-09-29

## 2020-09-28 MED ORDER — METOPROLOL TARTRATE 25 MG PO TABS
12.5000 mg | ORAL_TABLET | Freq: Two times a day (BID) | ORAL | 0 refills | Status: DC
Start: 2020-09-28 — End: 2020-10-15

## 2020-09-28 MED ORDER — BUTAMBEN-TETRACAINE-BENZOCAINE 2-2-14 % EX AERO
INHALATION_SPRAY | CUTANEOUS | Status: DC | PRN
  Administered 2020-09-28: 2 via TOPICAL

## 2020-09-28 MED ORDER — PROPOFOL 10 MG/ML IV BOLUS
INTRAVENOUS | Status: DC | PRN
  Administered 2020-09-28 (×2): 20 mg via INTRAVENOUS

## 2020-09-28 MED ORDER — ASPIRIN 81 MG PO TBEC
81.0000 mg | DELAYED_RELEASE_TABLET | Freq: Every day | ORAL | 1 refills | Status: DC
Start: 2020-09-29 — End: 2020-10-13

## 2020-09-28 MED FILL — FUROSEMIDE 40 MG TABLET: 40 | 30 days supply | Qty: 30 | Fill #0

## 2020-09-28 MED FILL — AMLODIPINE BESYLATE 10 MG T: 10 | 30 days supply | Qty: 30 | Fill #0

## 2020-09-28 MED FILL — ATORVASTATIN CALCIUM 40 MG: 40 | 30 days supply | Qty: 30 | Fill #0

## 2020-09-28 MED FILL — ASPIRIN LOW DOSE 81 MG TBEC: 81 | 30 days supply | Qty: 30 | Fill #0

## 2020-09-28 MED FILL — CLOPIDOGREL 75 MG TABLET: 75 | 18 days supply | Qty: 18 | Fill #0

## 2020-09-28 MED FILL — cloNIDine HCL 0.2 MG TABS: 0.2 | 30 days supply | Qty: 60 | Fill #0

## 2020-09-28 MED FILL — PANTOPRAZOLE SOD DR 40 MG T: 40 | 30 days supply | Qty: 30 | Fill #0

## 2020-09-28 MED FILL — METOPROLOL TARTRATE 25 MG T: 25 | 30 days supply | Qty: 30 | Fill #0

## 2020-09-28 MED FILL — HYDROCHLOROTHIAZIDE 12.5 MG: 12.5 | 30 days supply | Qty: 30 | Fill #0

## 2020-09-28 NOTE — Progress Notes (Signed)
SLP Cancellation Note  Patient Details Name: Silvino Selman MRN: [id number] DOB: 06-16-1976   Cancelled treatment:       Reason Eval/Treat Not Completed: Patient at procedure or test/unavailable   Sahand Gosch, Riley Nearing 09/28/2020, 11:15 AM

## 2020-09-28 NOTE — Interval H&P Note (Signed)
History and Physical Interval Note:  09/28/2020 9:24 AM  Alejandro Perez  has presented today for surgery, with the diagnosis of stroke.  The various methods of treatment have been discussed with the patient and family. After consideration of risks, benefits and other options for treatment, the patient has consented to  Procedure(s): TRANSESOPHAGEAL ECHOCARDIOGRAM (TEE) (N/A) as a surgical intervention.  The patient's history has been reviewed, patient examined, no change in status, stable for surgery.  I have reviewed the patient's chart and labs.  Questions were answered to the patient's satisfaction.     Armanda Magic

## 2020-09-28 NOTE — Progress Notes (Signed)
OT Cancellation Note  Patient Details Name: Schylar Wuebker MRN: [id number] DOB: [birth year]   Cancelled Treatment:    Reason Eval/Treat Not Completed: Patient at procedure or test/ unavailable;Other (comment) Pt out of room at endo, will check back as time allows for OT session.  Audery Amel., COTA/L Acute Rehabilitation Services [phone]   Angelina Pih 09/28/2020, 12:24 PM

## 2020-09-28 NOTE — TOC Transition Note (Addendum)
Transition of Care [facility]) - CM/SW Discharge Note   Patient Details  Name: Alejandro Perez MRN: [id number] Date of Birth: 1976-10-27  Transition [facility] Care [facility]) CM/SW Contact:  Kermit Balo, RN Phone Number: 09/28/2020, 1:48 PM   Clinical Narrative:    Pt is discharging home with outpatient therapy through [facility]. CM has verified the receipt of orders.  3 in 1 at the bedside.  TOC pharmacy to deliver medications to the room. CM has to provide petty cash to cover th cost of his medications as the Medicaid did not cover.  Pt has supervision at home and transport home.   Final next level of care: OP Rehab Barriers to Discharge: Barriers Unresolved (comment)   Patient Goals and CMS Choice   CMS Medicare.gov Compare Post Acute Care list provided to:: Patient Choice offered to / list presented to : Patient, Spouse  Discharge Placement                       Discharge Plan and Services   Discharge Planning Services: CM Consult            DME Arranged: 3-N-1 DME Agency: [employer] Date DME Agency Contacted: 09/27/20   Representative spoke with at DME Agency: Velna Hatchet            Social Determinants of Health (SDOH) Interventions     Readmission Risk Interventions No flowsheet data found.

## 2020-09-28 NOTE — Discharge Instructions (Signed)
Follow with Primary MD  in 7 days   Get CBC, CMP,  checked  by Primary MD next visit.    Activity: As tolerated with Full fall precautions use walker/cane & assistance as needed   Disposition Home    Diet: Heart Healthy , with feeding assistance and aspiration precautions.  On your next visit with your primary care physician please Get Medicines reviewed and adjusted.   Please request your Prim.MD to go over all Hospital Tests and Procedure/Radiological results at the follow up, please get all Hospital records sent to your Prim MD by signing hospital release before you go home.   If you experience worsening of your admission symptoms, develop shortness of breath, life threatening emergency, suicidal or homicidal thoughts you must seek medical attention immediately by calling 911 or calling your MD immediately  if symptoms less severe.  You Must read complete instructions/literature along with all the possible adverse reactions/side effects for all the Medicines you take and that have been prescribed to you. Take any new Medicines after you have completely understood and accpet all the possible adverse reactions/side effects.   Do not drive, operating heavy machinery, perform activities at heights, swimming or participation in water activities or provide baby sitting services if your were admitted for syncope or siezures until you have seen by Primary MD or a Neurologist and advised to do so again.  Do not drive when taking Pain medications.    Do not take more than prescribed Pain, Sleep and Anxiety Medications  Special Instructions: If you have smoked or chewed Tobacco  in the last 2 yrs please stop smoking, stop any regular Alcohol  and or any Recreational drug use.  Wear Seat belts while driving.   Please note  You were cared for by a hospitalist during your hospital stay. If you have any questions about your discharge medications or the care you received while you were in the  hospital after you are discharged, you can call the unit and asked to speak with the hospitalist on call if the hospitalist that took care of you is not available. Once you are discharged, your primary care physician will handle any further medical issues. Please note that NO REFILLS for any discharge medications will be authorized once you are discharged, as it is imperative that you return to your primary care physician (or establish a relationship with a primary care physician if you do not have one) for your aftercare needs so that they can reassess your need for medications and monitor your lab values.  

## 2020-09-28 NOTE — Progress Notes (Signed)
Pt off unit to Endo for procedure. Dionne Bucy RN

## 2020-09-28 NOTE — Discharge Summary (Signed)
Alejandro Perez, is a 44 y.o. male  DOB Oct 20, 1976  MRN [id number].  Admission date:  09/23/2020  Admitting Physician  Starleen Arms, MD  Discharge Date:  09/28/2020   Primary MD  Patient, No Pcp Per  Recommendations for primary care physician for things to follow:  -Patient to follow with neurology as an outpatient, ambulatory referral has been made. -Continue counseling about substance abuse.. -Patient heart monitor to be arranged by [facility] cardiology.   Admission Diagnosis  Other chest pain [R07.89] Primary hypertension [I10] CVA (cerebral vascular accident) (HCC) [I63.9] Cerebrovascular accident (CVA), unspecified mechanism (HCC) [I63.9]   Discharge Diagnosis  Other chest pain [R07.89] Primary hypertension [I10] CVA (cerebral vascular accident) (HCC) [I63.9] Cerebrovascular accident (CVA), unspecified mechanism (HCC) [I63.9]    Principal Problem:   CVA (cerebral vascular accident) (HCC) Active Problems:   Hypertension   CKD (chronic kidney disease)   (HFpEF) heart failure with preserved ejection fraction (HCC)      Past Medical History:  Diagnosis Date  . Anemia   . Chest pain   . Glaucoma   . Hypertension   . LVH (left ventricular hypertrophy)   . Spleen enlarged   . Stroke (HCC)   . Thrombocytopenia (HCC)     Past Surgical History:  Procedure Laterality Date  . CHOLECYSTECTOMY    . SPLENECTOMY, 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   Procedure: SPLENECTOMY;  Surgeon: [name], MD;  Location: [facility] OR;  Service: General;  Laterality: N/A;       History of present illness and  Hospital Course:     Kindly see H&P for history of present illness and admission details, please review complete Labs, Consult reports and Test reports for all details in brief  HPI  from the history and physical done on the day of admission11/02/2020  HPI: Alejandro Perez is a 44 y.o. male with medical history  significant of acquired TTP status post splenectomy, hypertension, pulmonary hypertension, HFpEF, CKD presented with known CVA that he was being treated for at [facility], but left AMA after he was unsatisfied with his care.  Patient states that on Sunday he woke up with dizziness, falling to his left, feeling weak, and speech difficulty consisting of slurred speech with his daughter.  Patient had initial work-up at [facility] and had been there for several days.  MRI from [place] showed moderate to large subacute cortical and subcortical infarct within the left MCA vascular territory, subacute infarct in the right corona radiata, lacunar infarcts.  Patient states that he had a fall while there and landed on his face with his wheelchair landed on top of him but was unable to get help.  And that is why he left and came to [facility]  He was accompanied with some records but it is unclear what the results of his full work-up was.  Patient reports some chest tightness, chronic abdominal pain, headache.  He denies constipation or diarrhea  ED Course: Vital significant for hypertension in the ED here.  Labs showed creatinine of  1.5 stable for his baseline.  LFTs within normal limits.  CBC showed hemoglobin of 17.  PT INR and PTT within normal limits.  Initial troponin XVIII, second pending.  Respiratory viral panel pending when seen.  EKG showed normal sinus rhythm with LVH and QTC prolongation at 45.  Hospital Course    Acute CVA - L MCAinfarct embolic secondary to uncontrolled risk factor with HTN, HLD and cocaine use. -Lower extremity Dopplers with no DVT, transcranial Dopplers with no RT L shunting, 2D echo with a preserved EF 55 to 60%. -Suspicion for embolic stroke, he was kept on telemetry monitoring during hospital stay, with no evidence of A. fib, had TEE done at day of discharge on 11/9, with no evidence of embolic source for his acute CVA, , recommendation has been made by  cardiology to arrange for heart monitor, discussed with cardiology, they will arrange for outpatient cardiac heart monitor.   -Now on aspirin and Plavix for 3 weeks, then aspirin alone.. -PT/OT/SLP consulted.    Condition for outpatient PT/OT/SLP which has been arranged. -No evidence of A. fib on telemetry monitor.  HTN -Resume home clonidine, amlodipine, metoprolol, Lasix, his heart rate on the lower side, so I did decrease his beta-blockers and started on hydrochlorothiazide instead.  HFpEF -Resume home metoprolol, Lasix(dose was lowered from twice daily to once daily on discharge as he is starting hydrochlorothiazide )  AKI on CKD -creatinine peaked at 1.8, possible contrast nephropathy, improving with IV fluids.  Back to baseline  Hyperlipidemia -Started on statin  Chronic Pain  -Continue with home medications.  Polysubstance use >Patient has a history of marijuana and recent trial of ecstasy. He denies cocaine but has had positive cocaine on his UDS is at other hospitalizations. -Patient has been requesting benzodiazepine prescription on discharge, which I have declined, given his history of polysubstance abuse   Discharge Condition:  stable   Follow UP   Follow-up Information    [facility]. Schedule an appointment as soon as possible for a visit in 4 week(s).   Specialty: Neurology Contact information: [address] [place] [address] [phone]       [facility] Outpatient Therapy Follow up.   Why: The outpatient therapy will contact you for the first appointment. Contact information: [address] [place], [place] [address] [phone]       Healthcare, [facility] Family Follow up.   Specialty: Family Medicine Why: fill out application and take to clinic. they will then schedule you an appointment Contact information: [address] [place] [address] [phone]                  Discharge Instructions  and  Discharge Medications     Discharge Instructions    Ambulatory referral to Neurology   Complete by: As directed    Follow up with stroke clinic NP (Jessica Vanschaick or Darrol Angel, if both not available, consider Manson Allan, or Ahern) at [facility] in about 4 weeks. Thanks.   Ambulatory referral to Occupational Therapy   Complete by: As directed    Ambulatory referral to Physical Therapy   Complete by: As directed    Ambulatory referral to Speech Therapy   Complete by: As directed    Discharge instructions   Complete by: As directed    Follow with Primary MD in 7 days   Get CBC, CMP,  checked  by Primary MD next visit.    Activity: As tolerated with Full fall precautions  use walker/cane & assistance as needed   Disposition Home   Diet: Heart Healthy , with feeding assistance and aspiration precautions.  On your next visit with your primary care physician please Get Medicines reviewed and adjusted.   Please request your Prim.MD to go over all Hospital Tests and Procedure/Radiological results at the follow up, please get all Hospital records sent to your Prim MD by signing hospital release before you go home.   If you experience worsening of your admission symptoms, develop shortness of breath, life threatening emergency, suicidal or homicidal thoughts you must seek medical attention immediately by calling 911 or calling your MD immediately  if symptoms less severe.  You Must read complete instructions/literature along with all the possible adverse reactions/side effects for all the Medicines you take and that have been prescribed to you. Take any new Medicines after you have completely understood and accpet all the possible adverse reactions/side effects.   Do not drive, operating heavy machinery, perform activities at heights, swimming or participation in water activities or provide baby sitting services if your were admitted for syncope or  siezures until you have seen by Primary MD or a Neurologist and advised to do so again.  Do not drive when taking Pain medications.    Do not take more than prescribed Pain, Sleep and Anxiety Medications  Special Instructions: If you have smoked or chewed Tobacco  in the last 2 yrs please stop smoking, stop any regular Alcohol  and or any Recreational drug use.  Wear Seat belts while driving.   Please note  You were cared for by a hospitalist during your hospital stay. If you have any questions about your discharge medications or the care you received while you were in the hospital after you are discharged, you can call the unit and asked to speak with the hospitalist on call if the hospitalist that took care of you is not available. Once you are discharged, your primary care physician will handle any further medical issues. Please note that NO REFILLS for any discharge medications will be authorized once you are discharged, as it is imperative that you return to your primary care physician (or establish a relationship with a primary care physician if you do not have one) for your aftercare needs so that they can reassess your need for medications and monitor your lab values.   Increase activity slowly   Complete by: As directed      Allergies as of 09/28/2020      Reactions   Morphine And Related Other (See Comments)   Severe abdominal pain      Medication List    TAKE these medications   acetaminophen 325 MG tablet Commonly known as: TYLENOL Take 650 mg by mouth every 6 (six) hours as needed for moderate pain or headache.   albuterol (2.5 MG/3ML) 0.083% nebulizer solution Commonly known as: PROVENTIL Take 2.5 mg by nebulization every 6 (six) hours as needed for wheezing.   amLODipine 10 MG tablet Commonly known as: NORVASC Take 1 tablet (10 mg total) by mouth daily.   aspirin 81 MG EC tablet Take 1 tablet (81 mg total) by mouth daily. Swallow whole. Start taking on:  September 29, 2020   atorvastatin 40 MG tablet Commonly known as: LIPITOR Take 1 tablet (40 mg total) by mouth daily.   cloNIDine 0.2 MG tablet Commonly known as: CATAPRES Take 1 tablet (0.2 mg total) by mouth 2 (two) times daily.   clopidogrel 75 MG tablet Commonly  known as: PLAVIX Take 1 tablet (75 mg total) by mouth daily. Take for 18 days then stop   cyanocobalamin 1000 MCG tablet Take 1 tablet (1,000 mcg total) by mouth daily.   folic acid 1 MG tablet Commonly known as: FOLVITE Take 1 tablet (1 mg total) by mouth daily.   furosemide 40 MG tablet Commonly known as: LASIX Take 1 tablet (40 mg total) by mouth daily. What changed: when to take this   hydrochlorothiazide 12.5 MG capsule Commonly known as: MICROZIDE Take 1 capsule (12.5 mg total) by mouth daily.   metoprolol tartrate 25 MG tablet Commonly known as: LOPRESSOR Take 0.5 tablets (12.5 mg total) by mouth 2 (two) times daily. What changed: how much to take   multivitamin with minerals Tabs tablet Take 1 tablet by mouth daily.   oxyCODONE 5 MG immediate release tablet Commonly known as: Oxy IR/ROXICODONE Take 2 tablets (10 mg total) by mouth every 6 (six) hours as needed for moderate pain or severe pain. What changed: reasons to take this   pantoprazole 40 MG tablet Commonly known as: PROTONIX Take 1 tablet (40 mg total) by mouth daily. Start taking on: September 29, 2020   polyethylene glycol 17 g packet Commonly known as: MIRALAX / GLYCOLAX Take 17 g by mouth daily. What changed:   when to take this  reasons to take this   senna-docusate 8.6-50 MG tablet Commonly known as: Senokot-S Take 2 tablets by mouth at bedtime as needed for mild constipation or moderate constipation.            Durable Medical Equipment  (From admission, onward)         Start     Ordered   09/27/20 1341  For home use only DME 3 n 1  Once        09/27/20 1341            Diet and Activity recommendation:  See Discharge Instructions above   Consults obtained -  Neurology   Major procedures and Radiology Reports - PLEASE review detailed and final reports for all details, in brief -    CT HEAD WO CONTRAST  Result Date: 09/25/2020 CLINICAL DATA:  Stroke.  Worsening headache. EXAM: CT HEAD WITHOUT CONTRAST TECHNIQUE: Contiguous axial images were obtained from the base of the skull through the vertex without intravenous contrast. COMPARISON:  CT head 09/24/2020 FINDINGS: Brain: Hypodensity in the left temporoparietal lobe unchanged. No acute hemorrhage identified. Subtle hypodensity right corona radiata may represent a recent infarct. Small hypodensity in the left posterior insula and external capsule unchanged consistent with chronic infarct. Chronic infarct right thalamus unchanged. Ventricle size normal without midline shift. No acute hemorrhage. Vascular: Negative for hyperdense vessel Skull: Negative Sinuses/Orbits: Mild mucosal edema left maxillary sinus. Negative orbit Other: None IMPRESSION: Acute infarct left temporoparietal lobe unchanged. No associated hemorrhage. No change from the prior CT. Electronically Signed   By: Marlan Palau M.D.   On: 09/25/2020 17:08   CT HEAD WO CONTRAST  Result Date: 09/24/2020 CLINICAL DATA:  Severe headache.  History of stroke. EXAM: CT HEAD WITHOUT CONTRAST TECHNIQUE: Contiguous axial images were obtained from the base of the skull through the vertex without intravenous contrast. COMPARISON:  Yesterday FINDINGS: Brain: More well-defined cytotoxic edema in the left frontal parietal convexity, expected. Subacute infarct at the right corona radiata is unchanged. Chronic lacunar infarct at the right thalamus and left corona radiata. No acute hemorrhage, hydrocephalus, or significant mass effect. Vascular: No hyperdense vessel or  unexpected calcification. Skull: Remote nasal bone fractures. Sinuses/Orbits: No acute finding. IMPRESSION: 1. No new abnormality. 2. Acute  left MCA branch infarct. 3. Subacute right corona radiata infarct. 4. Chronic lacunar infarcts. Electronically Signed   By: Marnee SpringJonathon  Watts M.D.   On: 09/24/2020 06:07   CT HEAD WO CONTRAST  Result Date: 09/23/2020 CLINICAL DATA:  Neuro deficit, acute stroke suspected. EXAM: CT HEAD WITHOUT CONTRAST TECHNIQUE: Contiguous axial images were obtained from the base of the skull through the vertex without intravenous contrast. COMPARISON:  Noncontrast head CT 09/20/2020. CT angiogram head/neck 09/20/2020. Noncontrast head CT 09/21/2020. Brain MRI 09/22/2020. FINDINGS: Brain: Cerebral volume is normal. A moderate to large acute/early subacute cortical and subcortical infarct within the left parietal lobe, posterosuperior left temporal lobe and posterior left insula has not significantly changed in extent as compared to the brain MRI performed one day prior. Subtle petechial hemorrhage is questioned, but there is no frank hemorrhagic conversion. Regional mass effect. No midline shift A small subacute infarct within the right corona radiata was better appreciated on the prior MRI. Redemonstrated chronic lacunar infarcts within the bilateral cerebral white matter, left basal ganglia and right thalamus. No extra-axial fluid collection. No evidence of intracranial mass. No midline shift. Vascular: Atherosclerotic calcifications. Skull: Normal. Negative for fracture or focal lesion. Sinuses/Orbits: Visualized orbits show no acute finding. Mild paranasal sinus mucosal thickening, most notably ethmoidal. Other: There are acute, comminuted and displaced bilateral nasal bone fractures, new as compared to prior examinations. Partial opacification of the right nasal passage. Suspected additional minimally displaced acute fractures of the bony nasal septum (for instance as seen on series 4, images 16 and 17). IMPRESSION: A moderate to large acute/early subacute cortical and subcortical infarct within the left MCA vascular  territory has not significantly changed in extent as compared to the brain MRI performed one day prior. There is regional mass effect but no midline shift. Subtle petechial hemorrhage is questioned, but there is no frank hemorrhagic conversion. A small subacute infarct within the right corona radiata was better appreciated on the prior MRI. Redemonstrated chronic lacunar infarcts within the bilateral cerebral white matter, left basal ganglia and right thalamus. Acute, comminuted and displaced bilateral nasal bone fractures which are new as compared to the prior examinations. Suspected minimally displaced acute fractures of the bony nasal septum. Partial opacification of the right nasal passage. Consider dedicated maxillofacial CT for further evaluation. Electronically Signed   By: Jackey LogeKyle  Golden DO   On: 09/23/2020 19:23   DG Chest Port 1 View  Result Date: 09/25/2020 CLINICAL DATA:  Dyspnea EXAM: PORTABLE CHEST 1 VIEW COMPARISON:  09/23/2020 chest radiograph. FINDINGS: Stable cardiomediastinal silhouette with normal heart size. No pneumothorax. No pleural effusion. Lungs appear clear, with no acute consolidative airspace disease and no pulmonary edema. IMPRESSION: No active disease. Electronically Signed   By: Delbert PhenixJason A Poff M.D.   On: 09/25/2020 07:59   DG Chest Portable 1 View  Result Date: 09/23/2020 CLINICAL DATA:  Chest pain EXAM: PORTABLE CHEST 1 VIEW COMPARISON:  September 21, 2020 FINDINGS: The heart size and mediastinal contours are within normal limits. Both lungs are clear. The visualized skeletal structures are unremarkable. IMPRESSION: No active disease. Electronically Signed   By: Jonna ClarkBindu  Avutu M.D.   On: 09/23/2020 16:49   VAS US TRANSCRANIAL DOPPLER W BUBBLES  Result Date: 09/27/2020  Transcranial Doppler with Bubble Indications: Stroke. Comparison Study: No prior study Performing Technologist: Gertie FeyMichelle Simonetti MHA, RDMS, RVT, RDCS  Examination Guidelines: A complete evaluation includes  B-mode imaging, spectral Doppler, color Doppler, and power Doppler as needed of all accessible portions of each vessel. Bilateral testing is considered an integral part of a complete examination. Limited examinations for reoccurring indications may be performed as noted.  Summary: No HITS at rest or during Valsalva. Negative transcranial Doppler Bubble study with no evidence of right to left intracardiac communication.  A vascular evaluation was performed. The right middle cerebral artery was studied. An IV was inserted into the patient's right forearm. Verbal informed consent was obtained.  NEGATIVE TCD Buuble study *See table(s) above for TCD measurements and observations.  Diagnosing physician: Delia Heady MD Electronically signed by Delia Heady MD on 09/27/2020 at 1:58:44 PM.    Final    ECHOCARDIOGRAM COMPLETE  Result Date: 09/24/2020    ECHOCARDIOGRAM REPORT   Patient Name:   Alejandro Perez Date of Exam: 09/24/2020 Medical Rec #:  [id number]    Height:       74.0 in Accession #:    [id number]   Weight:       159.3 lb Date of Birth:  1976/07/09   BSA:          1.973 m Patient Age:    44 years     BP:           155/87 mmHg Patient Gender: M            HR:           53 bpm. Exam Location:  Inpatient Procedure: 2D Echo, Color Doppler, Cardiac Doppler and Strain Analysis Indications:    Stroke i163.9  History:        Patient has prior history of Echocardiogram examinations, most                 recent 12/21/2017. Risk Factors:Hypertension.  Sonographer:    Irving Burton Senior RDCS Referring Phys: [id number] Scheryl Marten XU IMPRESSIONS  1. Left ventricular ejection fraction, by estimation, is 55 to 60%. The left ventricle has normal function. The left ventricle has no regional wall motion abnormalities. There is severe left ventricular hypertrophy. Left ventricular diastolic parameters  are indeterminate.  2. Right ventricular systolic function is normal. The right ventricular size is normal.  3. Left atrial size was mild to  moderately dilated.  4. Mild mitral valve regurgitation.  5. 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 Aortic valve regurgitation is mild to moderate. Mild aortic valve sclerosis is present, with no evidence of aortic valve stenosis.  6. The inferior vena cava is normal in size with greater than 50% respiratory variability, suggesting right atrial pressure of 3 mmHg. FINDINGS  Left Ventricle: Left ventricular ejection fraction, by estimation, is 55 to 60%. The left ventricle has normal function. The left ventricle has no regional wall motion abnormalities. The left ventricular internal cavity size was normal in size. There is  severe left ventricular hypertrophy. Left ventricular diastolic parameters are indeterminate. Right Ventricle: The right ventricular size is normal. Right vetricular wall thickness was not assessed. Right ventricular systolic function is normal. Left Atrium: Left atrial size was mild to moderately dilated. Right Atrium: Right atrial size was normal in size. Pericardium: There is no evidence of pericardial effusion. Mitral Valve: The mitral valve is abnormal. There is mild thickening of the mitral valve leaflet(s). Mild mitral annular calcification. Mild mitral valve regurgitation. Tricuspid Valve: The tricuspid valve is normal in structure. Tricuspid valve regurgitation is trivial. Aortic Valve: Aortic insufficiency is eccentric, directed posterior into  LV. The aortic valve is abnormal. Aortic valve regurgitation is mild to moderate. Aortic regurgitation PHT measures 897 msec. Mild aortic valve sclerosis is present, with no evidence of aortic valve stenosis. Pulmonic Valve: The pulmonic valve was grossly normal. Pulmonic valve regurgitation is not visualized. Aorta: The aortic root and ascending aorta are structurally normal, with no evidence of dilitation. Venous: The inferior vena cava is normal in size with greater than 50% respiratory  variability, suggesting right atrial pressure of 3 mmHg. IAS/Shunts: No atrial level shunt detected by color flow Doppler.  LEFT VENTRICLE PLAX 2D LVIDd:         4.30 cm  Diastology LVIDs:         2.90 cm  LV e' medial:    5.22 cm/s LV PW:         1.50 cm  LV E/e' medial:  11.9 LV IVS:        1.80 cm  LV e' lateral:   5.11 cm/s LVOT diam:     2.10 cm  LV E/e' lateral: 12.2 LV SV:         87 LV SV Index:   44 LVOT Area:     3.46 cm  RIGHT VENTRICLE RV S prime:     11.00 cm/s TAPSE (M-mode): 2.3 cm LEFT ATRIUM              Index       RIGHT ATRIUM           Index LA diam:        4.50 cm  2.28 cm/m  RA Area:     14.10 cm LA Vol (A2C):   112.0 ml 56.76 ml/m RA Volume:   29.70 ml  15.05 ml/m LA Vol (A4C):   76.1 ml  38.57 ml/m LA Biplane Vol: 92.1 ml  46.68 ml/m  AORTIC VALVE LVOT Vmax:   111.00 cm/s LVOT Vmean:  71.700 cm/s LVOT VTI:    0.252 m AI PHT:      897 msec  AORTA Ao Root diam: 3.70 cm Ao Asc diam:  3.50 cm MITRAL VALVE MV Area (PHT): 2.62 cm    SHUNTS MV Decel Time: 290 msec    Systemic VTI:  0.25 m MV E velocity: 62.20 cm/s  Systemic Diam: 2.10 cm MV A velocity: 48.00 cm/s MV E/A ratio:  1.30 Dietrich Pates MD Electronically signed by Dietrich Pates MD Signature Date/Time: 09/24/2020/3:44:05 PM    Final    CT Angio Chest/Abd/Pel for Dissection W and/or Wo Contrast  Result Date: 09/23/2020 CLINICAL DATA:  Abdominal pain, question aortic dissection EXAM: CT ANGIOGRAPHY CHEST, ABDOMEN AND PELVIS TECHNIQUE: Non-contrast CT of the chest was initially obtained. Multidetector CT imaging through the chest, abdomen and pelvis was performed using the standard protocol during bolus administration of intravenous contrast. Multiplanar reconstructed images and MIPs were obtained and reviewed to evaluate the vascular anatomy. CONTRAST:  OMNIPAQUE IOHEXOL 350 MG/ML SOLN COMPARISON:  None. FINDINGS: CTA CHEST FINDINGS Cardiovascular: --Heart: The heart size is normal.  There is nopericardial effusion. --Aorta: The  course and caliber of the thoracic aorta are normal. There is no aortic atherosclerotic calcification. Precontrast images show no aortic intramural hematoma. There is no blood pool, dissection or penetrating ulcer demonstrated on arterial phase postcontrast imaging. There is a origin of the left carotid artery the brachiocephalic trunk. Minimal calcifications seen at the origin of brachiocephalic trunk. The proximal arch vessels are widely patent. --Pulmonary Arteries: Contrast timing is optimized for preferential opacification  of the aorta. Within that limitation, normal central pulmonary arteries. Mediastinum/Nodes: No mediastinal, hilar or axillary lymphadenopathy. The visualized thyroid and thoracic esophageal course are unremarkable. Lungs/Pleura: Mild ground-glass opacity seen at the posterior right lung base. There is a calcified granuloma seen in the right middle lobe. No focal airspace consolidation. No focal pleural abnormality. Musculoskeletal: No chest wall abnormality. No acute osseous findings. Review of the MIP images confirms the above findings. CTA ABDOMEN AND PELVIS FINDINGS VASCULAR Aorta: Normal caliber aorta without aneurysm, dissection, vasculitis or hemodynamically significant stenosis. There is scattered mild aortic atherosclerosis at the aorta bi-iliac bifurcation. Celiac: No aneurysm, dissection or hemodynamically significant stenosis. Normal branching pattern SMA: Widely patent without dissection or stenosis. Renals: Single renal arteries bilaterally. No aneurysm, dissection, stenosis or evidence of fibromuscular dysplasia. IMA: Patent without abnormality. Inflow: Scattered atherosclerosis at the common iliac vasculature. Veins: Normal course and caliber of the major veins. Assessment is otherwise limited by the arterial dominant contrast phase. Review of the MIP images confirms the above findings. NON-VASCULAR Hepatobiliary: Normal hepatic contours and density. No visible biliary  dilatation. The patient is status post cholecystectomy. No biliary ductal dilation. Pancreas: Normal contours without ductal dilatation. No peripancreatic fluid collection. 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 Adrenals/Urinary Tract: --Adrenal glands: Normal. --Right kidney/ureter: No hydronephrosis or perinephric stranding. No nephrolithiasis. No obstructing ureteral stones. --Left kidney/ureter: No hydronephrosis or perinephric stranding. No nephrolithiasis. No obstructing ureteral stones. --Urinary bladder: Unremarkable. Stomach/Bowel: --Stomach/Duodenum: No hiatal hernia or other gastric abnormality. Normal duodenal course and caliber. --Small bowel: No dilatation or inflammation. --Colon: No focal abnormality. --Appendix: Normal. Lymphatic:  No abdominal or pelvic lymphadenopathy. Reproductive: No free fluid in the pelvis. Musculoskeletal. No bony spinal canal stenosis or focal osseous abnormality. Other: None. Review of the MIP images confirms the above findings. IMPRESSION: No acute aortic abnormality. Mild aortic atherosclerosis.  Aortic Atherosclerosis (ICD10-I70.0). No other acute intrathoracic or pelvic pathology. Electronically Signed   By: Jonna Clark M.D.   On: 09/23/2020 19:14   VAS [place] LOWER EXTREMITY VENOUS (DVT)  Result Date: 09/25/2020  Lower Venous DVT Study Indications: Stroke.  Comparison Study: No prior study Performing Technologist: Gertie Fey MHA, RDMS, RVT, RDCS  Examination Guidelines: A complete evaluation includes B-mode imaging, spectral Doppler, color Doppler, and power Doppler as needed of all accessible portions of each vessel. Bilateral testing is considered an integral part of a complete examination. Limited examinations for reoccurring indications may be performed as noted. The reflux portion of the exam is performed with the patient in reverse Trendelenburg.  +---------+---------------+---------+-----------+----------+--------------+ RIGHT     CompressibilityPhasicitySpontaneityPropertiesThrombus Aging +---------+---------------+---------+-----------+----------+--------------+ CFV      Full           Yes      Yes                                 +---------+---------------+---------+-----------+----------+--------------+ SFJ      Full                                                        +---------+---------------+---------+-----------+----------+--------------+ FV Prox  Full                                                        +---------+---------------+---------+-----------+----------+--------------+  FV Mid   Full                                                        +---------+---------------+---------+-----------+----------+--------------+ FV DistalFull                                                        +---------+---------------+---------+-----------+----------+--------------+ PFV      Full                                                        +---------+---------------+---------+-----------+----------+--------------+ POP      Full           Yes      Yes                                 +---------+---------------+---------+-----------+----------+--------------+ PTV      Full                                                        +---------+---------------+---------+-----------+----------+--------------+ PERO     Full                                                        +---------+---------------+---------+-----------+----------+--------------+   +---------+---------------+---------+-----------+----------+--------------+ LEFT     CompressibilityPhasicitySpontaneityPropertiesThrombus Aging +---------+---------------+---------+-----------+----------+--------------+ CFV      Full           Yes      Yes                                 +---------+---------------+---------+-----------+----------+--------------+ SFJ      Full                                                         +---------+---------------+---------+-----------+----------+--------------+ FV Prox  Full                                                        +---------+---------------+---------+-----------+----------+--------------+ FV Mid   Full                                                        +---------+---------------+---------+-----------+----------+--------------+  FV DistalFull                                                        +---------+---------------+---------+-----------+----------+--------------+ PFV      Full                                                        +---------+---------------+---------+-----------+----------+--------------+ POP      Full           Yes      Yes                                 +---------+---------------+---------+-----------+----------+--------------+ PTV      Full                                                        +---------+---------------+---------+-----------+----------+--------------+ PERO     Full                                                        +---------+---------------+---------+-----------+----------+--------------+     Summary: RIGHT: - There is no evidence of deep vein thrombosis in the lower extremity.  - No cystic structure found in the popliteal fossa.  LEFT: - There is no evidence of deep vein thrombosis in the lower extremity.  - No cystic structure found in the popliteal fossa.  *See table(s) above for measurements and observations. Electronically signed by Lemar Livings MD on 09/25/2020 at 3:31:03 PM.    Final     Micro Results     Recent Results (from the past 240 hour(s))  Respiratory Panel by RT PCR (Flu A&B, Covid) - Nasopharyngeal Swab     Status: None   Collection Time: 09/23/20 11:41 PM   Specimen: Nasopharyngeal Swab  Result Value Ref Range Status   SARS Coronavirus 2 by RT PCR NEGATIVE NEGATIVE Final    Comment: (NOTE) SARS-CoV-2 target nucleic acids are NOT  DETECTED.  The SARS-CoV-2 RNA is generally detectable in upper respiratoy specimens during the acute phase of infection. The lowest concentration of SARS-CoV-2 viral copies this assay can detect is 131 copies/mL. A negative result does not preclude SARS-Cov-2 infection and should not be used as the sole basis for treatment or other patient management decisions. A negative result may occur with  improper specimen collection/handling, submission of specimen other than nasopharyngeal swab, presence of viral mutation(s) within the areas targeted by this assay, and inadequate number of viral copies (<131 copies/mL). A negative result must be combined with clinical observations, patient history, and epidemiological information. The expected result is Negative.  Fact Sheet for Patients:  https://www.moore.com/  Fact Sheet for Healthcare Providers:  https://www.young.biz/  This test is no t yet approved or cleared by the [place] FDA and  has  been authorized for detection and/or diagnosis of SARS-CoV-2 by FDA under an Emergency Use Authorization (EUA). This EUA will remain  in effect (meaning this test can be used) for the duration of the COVID-19 declaration under Section 564(b)(1) of the Act, 21 U.S.C. section 360bbb-3(b)(1), unless the authorization is terminated or revoked sooner.     Influenza A by PCR NEGATIVE NEGATIVE Final   Influenza B by PCR NEGATIVE NEGATIVE Final    Comment: (NOTE) The Xpert Xpress SARS-CoV-2/FLU/RSV assay is intended as an aid in  the diagnosis of influenza from Nasopharyngeal swab specimens and  should not be used as a sole basis for treatment. Nasal washings and  aspirates are unacceptable for Xpert Xpress SARS-CoV-2/FLU/RSV  testing.  Fact Sheet for Patients: https://www.moore.com/  Fact Sheet for Healthcare Providers: https://www.young.biz/  This test is not yet  approved or cleared by the [place] FDA and  has been authorized for detection and/or diagnosis of SARS-CoV-2 by  FDA under an Emergency Use Authorization (EUA). This EUA will remain  in effect (meaning this test can be used) for the duration of the  Covid-19 declaration under Section 564(b)(1) of the Act, 21  U.S.C. section 360bbb-3(b)(1), unless the authorization is  terminated or revoked. Performed at [facility], [address], [place], [place] [address]        Today   Subjective:   Alejandro Perez today has no headache,no chest or abdominal pain.  Objective:   Blood pressure (!) 157/85, pulse (!) 56, temperature (!) 97.3 F (36.3 C), temperature source Oral, resp. rate 17, height 6\' 2"  (1.88 m), weight 71 kg, SpO2 100 %.   Intake/Output Summary (Last 24 hours) at 09/28/2020 1353 Last data filed at 09/28/2020 1156 Gross per 24 hour  Intake 3853.23 ml  Output 3150 ml  Net 703.23 ml    Exam Awake Alert, Oriented x 3, Symmetrical Chest wall movement, Good air movement bilaterally, CTAB RRR,No Gallops,Rubs or new Murmurs, No Parasternal Heave +ve B.Sounds, Abd Soft, Non tender, No rebound -guarding or rigidity. No Cyanosis, Clubbing or edema, No new Rash or bruise  Data Review   CBC w Diff:  Lab Results  Component Value Date   WBC 6.1 09/26/2020   HGB 15.7 09/26/2020   HCT RESULTS UNAVAILABLE DUE TO INTERFERING SUBSTANCE 09/26/2020   PLT 319 09/26/2020   LYMPHOPCT 20 09/23/2020   MONOPCT 13 09/23/2020   EOSPCT 8 09/23/2020   BASOPCT 1 09/23/2020    CMP:  Lab Results  Component Value Date   NA 140 09/27/2020   K 3.9 09/27/2020   CL 102 09/27/2020   CO2 27 09/27/2020   BUN 6 09/27/2020   CREATININE 1.49 (H) 09/27/2020   PROT 7.0 09/23/2020   ALBUMIN 4.3 09/23/2020   BILITOT 0.8 09/23/2020   ALKPHOS 84 09/23/2020   AST 21 09/23/2020   ALT 16 09/23/2020  .   Total Time in preparing paper work, data evaluation and todays exam - 35  minutes  13/02/2020 M.D on 09/28/2020 at 1:53 PM  [facility]   Office  [phone]

## 2020-09-28 NOTE — Progress Notes (Signed)
  Echocardiogram 2D Echocardiogram has been performed.  Gerda Diss 09/28/2020, 12:47 PM

## 2020-09-28 NOTE — Progress Notes (Signed)
Pt back to room from Endo, pt alert and verbally responsive. Spouse remains at bedside. Will continue to closely monitor. Dionne Bucy RN   09/28/20 1325  Vitals  Temp 97.7 F (36.5 C)  Temp Source Oral  BP (!) 157/85  MAP (mmHg) 109  BP Location Left Arm  BP Method Automatic  Patient Position (if appropriate) Sitting  Pulse Rate (!) 56  Pulse Rate Source Dinamap  Resp 17  Level of Consciousness  Level of Consciousness Alert  Oxygen Therapy  SpO2 100 %  Pain Assessment  Pain Scale 0-10  Pain Score 0

## 2020-09-28 NOTE — Progress Notes (Signed)
Pt discharge education and instructions completed with pt and spouse at bedside. Pt IV and telemetry removed. Pt discharge home with spouse to transport him home. Pt home prescriptions delivered to pt by pharmacy and pt DME 3 in 1 delivered to bedside. Pt transported off unit via wheelchair with belongings including DME to the side. Volunteer services wheeled pt down. Dionne Bucy RN

## 2020-09-28 NOTE — CV Procedure (Signed)
    PROCEDURE NOTE:  Procedure:  Transesophageal echocardiogram Operator:  Armanda Magic, MD Indications:  CVA Complications: None  During this procedure the patient is administered a total of Propofol 334 mg to achieve and maintain moderate conscious sedation.  The patient's heart rate, blood pressure, and oxygen saturation are monitored continuously during the procedure by andesthesia.   Results: Normal LV size and function Normal RV size and function Normal RA Mild to moderately dilated LA with mild spontaneous echo contrast consistent with sluggish flow.  No evidence of thrombus in LA or LA appendage.  Normal TV with mild TR Normal PV with trivial PR Normal MV with mild MR Normal trileaflet AV with moderate aortic insufficiency eccentrically directed towards the anterior mitral valve leaflet.  Normal interatrial septum with possible shunt by colorflow doppler but no evidence of shunt by agitated saline contrast injection.  Thoracic and ascending aorta not well visualized.  No cardiac source of stroke.   The patient tolerated the procedure well and was transferred back to their room in stable condition.  Signed: Armanda Magic, MD [facility]

## 2020-09-28 NOTE — Progress Notes (Signed)
Physical Therapy Treatment Patient Details Name: Alejandro Perez MRN: [id number] DOB: [birth year] Today's Date: 09/28/2020    History of Present Illness The pt is a 44 yo male presenting with weakness, SOB, and slurred speech in addition to chest, head, and throat pain. Pt at [facility] hospital yesterday for stroke, left AMA as he was unsatisfied with his care. MRI from [place] showed moderate to large subacute cortical and subcortical infarct within the left MCA vascular territory, subacute infarct in the right corona radiata, lacunar infarcts.     PT Comments    Pt making very good progress with mobility. Was very anxious about getting up and fearful of falling. Relays that his anxiety has been bad since leaving [facility] and this is new for him. He requests something for anxiety when going home. I encouraged him to speak to his physician. Pt performed bed mobility and transfer without assist. He began ambulating with IV pole and was very guarded but soon did not need pole and increased pace with appropriate arm swing. He also ascended and descended 5 stairs with rail with supervision. He did display STM deficits and problem solving difficulties and these do affect his safety awareness. Continue to recommend Home with wife and outpatient PT to continue to work on these deficits. PT will continue to follow.   Follow Up Recommendations  Outpatient PT;Supervision for mobility/OOB     Equipment Recommendations  None recommended by PT    Recommendations for Other Services       Precautions / Restrictions Precautions Precautions: Fall;Other (comment) Precaution Comments: legally blind  Restrictions Weight Bearing Restrictions: No    Mobility  Bed Mobility Overal bed mobility: Modified Independent Bed Mobility: Supine to Sit;Sit to Supine     Supine to sit: Modified independent (Device/Increase time) Sit to supine: Modified independent (Device/Increase time)   General bed mobility  comments: pt able to come to EOB as well as return to supine without physical assist  Transfers Overall transfer level: Needs assistance Equipment used: None Transfers: Sit to/from Stand Sit to Stand: +2 safety/equipment;Min guard         General transfer comment: pt very anxious to get up but agreed once he had a person on each side of him for safety. Then he stood with no physical assist   Ambulation/Gait Ambulation/Gait assistance: Min guard Gait Distance (Feet): 200 Feet Assistive device: IV Pole;None Gait Pattern/deviations: Decreased stride length;Step-through pattern Gait velocity: decreased Gait velocity interpretation: >2.62 ft/sec, indicative of community ambulatory General Gait Details: pt began ambulation pushing IV pole and very guarded. As he continued he was able to stop pushing pole and began to use arm swing and increase pace. No LOB. Safety concern is more cognitive in that he is easily distracted. Discussed the need to not try to multitask with ambulation. Significant other understands this as well   Stairs Stairs: Yes Stairs assistance: Supervision Stair Management: One rail Right;Alternating pattern;Forwards Number of Stairs: 5 General stair comments: no difficulty with stairs with use of rail   Wheelchair Mobility    Modified Rankin (Stroke Patients Only) Modified Rankin (Stroke Patients Only) Pre-Morbid Rankin Score: No symptoms Modified Rankin: Moderately severe disability     Balance Overall balance assessment: Needs assistance Sitting-balance support: Feet supported;No upper extremity supported Sitting balance-Leahy Scale: Good     Standing balance support: No upper extremity supported Standing balance-Leahy Scale: Fair Standing balance comment: able to perform dynamic activity within BOS without LOB  Cognition Arousal/Alertness: Awake/alert Behavior During Therapy: Anxious Overall Cognitive Status:  Impaired/Different from baseline Area of Impairment: Safety/judgement;Problem solving;Memory                     Memory: Decreased short-term memory   Safety/Judgement: Decreased awareness of safety;Decreased awareness of deficits   Problem Solving: Difficulty sequencing;Requires verbal cues General Comments: STM deficits noted, could not pathfind back to room until given max vc's to point out familiar things in hallway. Problem solving deficits noted as well.       Exercises      General Comments General comments (skin integrity, edema, etc.): Pt's wife present throughout session. Pt feeling much better about mobility after ambulation      Pertinent Vitals/Pain Pain Assessment: No/denies pain    Home Living                      Prior Function            PT Goals (current goals can now be found in the care plan section) Acute Rehab PT Goals Patient Stated Goal: home PT Goal Formulation: With patient Time For Goal Achievement: 10/08/20 Potential to Achieve Goals: Good Progress towards PT goals: Progressing toward goals    Frequency    Min 3X/week      PT Plan Current plan remains appropriate    Co-evaluation              AM-PAC PT "6 Clicks" Mobility   Outcome Measure  Help needed turning from your back to your side while in a flat bed without using bedrails?: None Help needed moving from lying on your back to sitting on the side of a flat bed without using bedrails?: None Help needed moving to and from a bed to a chair (including a wheelchair)?: None Help needed standing up from a chair using your arms (e.g., wheelchair or bedside chair)?: None Help needed to walk in hospital room?: A Little Help needed climbing 3-5 steps with a railing? : A Little 6 Click Score: 22    End of Session Equipment Utilized During Treatment: Gait belt Activity Tolerance: Patient tolerated treatment well Patient left: in bed;with call bell/phone within  reach;with bed alarm set;with family/visitor present Nurse Communication: Mobility status PT Visit Diagnosis: Other abnormalities of gait and mobility (R26.89);Muscle weakness (generalized) (M62.81);Pain     Time: 3825-0539 PT Time Calculation (min) (ACUTE ONLY): 25 min  Charges:  $Gait Training: 23-37 mins                     Lyanne Co, PT  Acute Rehab Services  Pager [phone] Office [phone]    Lawana Chambers Calynn Ferrero 09/28/2020, 11:53 AM

## 2020-09-28 NOTE — Transfer of Care (Signed)
Immediate Anesthesia Transfer of Care Note  Patient: Alejandro Perez  Procedure(s) Performed: TRANSESOPHAGEAL ECHOCARDIOGRAM (TEE) (N/A ) BUBBLE STUDY  Patient Location: PACU and Endoscopy Unit  Anesthesia Type:MAC  Level of Consciousness: awake, patient cooperative and responds to stimulation  Airway & Oxygen Therapy: Patient Spontanous Breathing and Patient connected to nasal cannula oxygen  Post-op Assessment: Report given to RN, Post -op Vital signs reviewed and stable and Patient moving all extremities X 4  Post vital signs: Reviewed and stable  Last Vitals:  Vitals Value Taken Time  BP 186/99 09/28/20 1228  Temp    Pulse 66 09/28/20 1230  Resp 18 09/28/20 1230  SpO2 100 % 09/28/20 1230  Vitals shown include unvalidated device data.  Last Pain:  Vitals:   09/28/20 1228  TempSrc:   PainSc: 3       Patients Stated Pain Goal: 1 (37/85/88 5027)  Complications: No complications documented.

## 2020-09-28 NOTE — Progress Notes (Signed)
   09/28/20 0950  Clinical Encounter Type  Visited With Patient not available  Visit Type Initial  Referral From Physician  Consult/Referral To Chaplain   Chaplain responded to consult request. Pt was being treated. Chaplain will attempt to visit at a later time.  This note was prepared by Chaplain Resident, Tacy Learn, MDiv. For questions, please contact by phone at [phone].

## 2020-09-28 NOTE — Anesthesia Postprocedure Evaluation (Signed)
Anesthesia Post Note  Patient: Alejandro Perez  Procedure(s) Performed: TRANSESOPHAGEAL ECHOCARDIOGRAM (TEE) (N/A ) BUBBLE STUDY     Patient location during evaluation: Endoscopy Anesthesia Type: General Level of consciousness: awake and alert Pain management: pain level controlled Vital Signs Assessment: post-procedure vital signs reviewed and stable Respiratory status: spontaneous breathing, nonlabored ventilation, respiratory function stable and patient connected to nasal cannula oxygen Cardiovascular status: blood pressure returned to baseline and stable Postop Assessment: no apparent nausea or vomiting Anesthetic complications: no   No complications documented.  Last Vitals:  Vitals:   09/28/20 1247 09/28/20 1325  BP: (!) 166/88 (!) 157/85  Pulse: 80 (!) 56  Resp: 16 17  Temp:  36.5 C  SpO2: 99% 100%    Last Pain:  Vitals:   09/28/20 1325  TempSrc: Oral  PainSc: 0-No pain                 Trevor Iha

## 2020-09-29 ENCOUNTER — Encounter (HOSPITAL_COMMUNITY): Payer: Self-pay | Admitting: Cardiology

## 2020-09-30 ENCOUNTER — Encounter: Payer: Self-pay | Admitting: *Deleted

## 2020-09-30 ENCOUNTER — Telehealth: Payer: Self-pay | Admitting: *Deleted

## 2020-09-30 NOTE — Progress Notes (Signed)
Patient ID: Alejandro Perez, male   DOB: 11-15-1976, 44 y.o.   MRN: [id number] Patient enrolled for [employer] to ship a 30 day cardiac event monitor to the address on record.  Letter with instructions and self pay discount information mailed to patients address on record.

## 2020-09-30 NOTE — Telephone Encounter (Signed)
No answer, no DPR on record.  Enrolled patient for monitor to be shipped to address on record.  Came up as unrecognized address.  [employer] monitor company will be calling patient to confirm shipping address before sending monitor out by [employer].

## 2020-10-10 ENCOUNTER — Other Ambulatory Visit: Payer: Self-pay

## 2020-10-10 ENCOUNTER — Emergency Department (HOSPITAL_COMMUNITY): Payer: Medicaid - Out of State

## 2020-10-10 ENCOUNTER — Inpatient Hospital Stay (HOSPITAL_COMMUNITY)
Admission: EM | Admit: 2020-10-10 | Discharge: 2020-10-15 | DRG: 065 | Discharge status: Against Medical Advice | Payer: Medicaid - Out of State | Attending: Student | Admitting: Student

## 2020-10-10 DIAGNOSIS — I208 Other forms of angina pectoris: Secondary | ICD-10-CM | POA: Diagnosis present

## 2020-10-10 DIAGNOSIS — R001 Bradycardia, unspecified: Secondary | ICD-10-CM | POA: Diagnosis present

## 2020-10-10 DIAGNOSIS — R0789 Other chest pain: Secondary | ICD-10-CM

## 2020-10-10 DIAGNOSIS — Z9049 Acquired absence of other specified parts of digestive tract: Secondary | ICD-10-CM

## 2020-10-10 DIAGNOSIS — F1410 Cocaine abuse, uncomplicated: Secondary | ICD-10-CM | POA: Diagnosis present

## 2020-10-10 DIAGNOSIS — S066X9A Traumatic subarachnoid hemorrhage with loss of consciousness of unspecified duration, initial encounter: Secondary | ICD-10-CM | POA: Diagnosis not present

## 2020-10-10 DIAGNOSIS — R29810 Facial weakness: Secondary | ICD-10-CM | POA: Diagnosis present

## 2020-10-10 DIAGNOSIS — R636 Underweight: Secondary | ICD-10-CM | POA: Diagnosis present

## 2020-10-10 DIAGNOSIS — R519 Headache, unspecified: Secondary | ICD-10-CM | POA: Diagnosis not present

## 2020-10-10 DIAGNOSIS — R4701 Aphasia: Secondary | ICD-10-CM | POA: Diagnosis present

## 2020-10-10 DIAGNOSIS — I259 Chronic ischemic heart disease, unspecified: Secondary | ICD-10-CM | POA: Diagnosis not present

## 2020-10-10 DIAGNOSIS — Z79899 Other long term (current) drug therapy: Secondary | ICD-10-CM

## 2020-10-10 DIAGNOSIS — I639 Cerebral infarction, unspecified: Secondary | ICD-10-CM | POA: Diagnosis not present

## 2020-10-10 DIAGNOSIS — I130 Hypertensive heart and chronic kidney disease with heart failure and stage 1 through stage 4 chronic kidney disease, or unspecified chronic kidney disease: Secondary | ICD-10-CM | POA: Diagnosis present

## 2020-10-10 DIAGNOSIS — Z87891 Personal history of nicotine dependence: Secondary | ICD-10-CM

## 2020-10-10 DIAGNOSIS — Z7982 Long term (current) use of aspirin: Secondary | ICD-10-CM

## 2020-10-10 DIAGNOSIS — E785 Hyperlipidemia, unspecified: Secondary | ICD-10-CM | POA: Diagnosis present

## 2020-10-10 DIAGNOSIS — Z7902 Long term (current) use of antithrombotics/antiplatelets: Secondary | ICD-10-CM

## 2020-10-10 DIAGNOSIS — Y92231 Patient bathroom in hospital as the place of occurrence of the external cause: Secondary | ICD-10-CM | POA: Diagnosis not present

## 2020-10-10 DIAGNOSIS — E876 Hypokalemia: Secondary | ICD-10-CM | POA: Diagnosis present

## 2020-10-10 DIAGNOSIS — Z20822 Contact with and (suspected) exposure to covid-19: Secondary | ICD-10-CM | POA: Diagnosis present

## 2020-10-10 DIAGNOSIS — Z681 Body mass index (BMI) 19 or less, adult: Secondary | ICD-10-CM

## 2020-10-10 DIAGNOSIS — W1830XA Fall on same level, unspecified, initial encounter: Secondary | ICD-10-CM | POA: Diagnosis not present

## 2020-10-10 DIAGNOSIS — Z8673 Personal history of transient ischemic attack (TIA), and cerebral infarction without residual deficits: Secondary | ICD-10-CM

## 2020-10-10 DIAGNOSIS — D580 Hereditary spherocytosis: Secondary | ICD-10-CM | POA: Diagnosis present

## 2020-10-10 DIAGNOSIS — R079 Chest pain, unspecified: Secondary | ICD-10-CM | POA: Diagnosis present

## 2020-10-10 DIAGNOSIS — Z9081 Acquired absence of spleen: Secondary | ICD-10-CM

## 2020-10-10 DIAGNOSIS — I358 Other nonrheumatic aortic valve disorders: Secondary | ICD-10-CM | POA: Diagnosis present

## 2020-10-10 DIAGNOSIS — I5032 Chronic diastolic (congestive) heart failure: Secondary | ICD-10-CM | POA: Diagnosis present

## 2020-10-10 DIAGNOSIS — H409 Unspecified glaucoma: Secondary | ICD-10-CM | POA: Diagnosis present

## 2020-10-10 DIAGNOSIS — I63412 Cerebral infarction due to embolism of left middle cerebral artery: Principal | ICD-10-CM | POA: Diagnosis present

## 2020-10-10 DIAGNOSIS — F1210 Cannabis abuse, uncomplicated: Secondary | ICD-10-CM | POA: Diagnosis present

## 2020-10-10 DIAGNOSIS — G8929 Other chronic pain: Secondary | ICD-10-CM

## 2020-10-10 DIAGNOSIS — I351 Nonrheumatic aortic (valve) insufficiency: Secondary | ICD-10-CM | POA: Diagnosis present

## 2020-10-10 DIAGNOSIS — R5381 Other malaise: Secondary | ICD-10-CM | POA: Diagnosis present

## 2020-10-10 DIAGNOSIS — Z885 Allergy status to narcotic agent status: Secondary | ICD-10-CM

## 2020-10-10 DIAGNOSIS — N1831 Chronic kidney disease, stage 3a: Secondary | ICD-10-CM | POA: Diagnosis present

## 2020-10-10 LAB — CBC
HCT: 45.7 % (ref 39.0–52.0)
Hemoglobin: 16.5 g/dL (ref 13.0–17.0)
MCH: 30.9 pg (ref 26.0–34.0)
MCHC: 36.1 g/dL — ABNORMAL HIGH (ref 30.0–36.0)
MCV: 85.6 fL (ref 80.0–100.0)
Platelets: 491 10*3/uL — ABNORMAL HIGH (ref 150–400)
RBC: 5.34 MIL/uL (ref 4.22–5.81)
RDW: 12.7 % (ref 11.5–15.5)
WBC: 9.2 10*3/uL (ref 4.0–10.5)
nRBC: 0 % (ref 0.0–0.2)

## 2020-10-10 LAB — BASIC METABOLIC PANEL
Anion gap: 15 (ref 5–15)
BUN: 13 mg/dL (ref 6–20)
CO2: 32 mmol/L (ref 22–32)
Calcium: 10.1 mg/dL (ref 8.9–10.3)
Chloride: 89 mmol/L — ABNORMAL LOW (ref 98–111)
Creatinine, Ser: 1.87 mg/dL — ABNORMAL HIGH (ref 0.61–1.24)
GFR, Estimated: 44 mL/min — ABNORMAL LOW (ref >60)
Glucose, Bld: 90 mg/dL (ref 70–99)
Potassium: 3.3 mmol/L — ABNORMAL LOW (ref 3.5–5.1)
Sodium: 136 mmol/L (ref 135–145)

## 2020-10-10 LAB — RAPID URINE DRUG SCREEN, HOSP PERFORMED
Amphetamines: NOT DETECTED
Barbiturates: NOT DETECTED
Benzodiazepines: NOT DETECTED
Cocaine: NOT DETECTED
Opiates: POSITIVE — AB
Tetrahydrocannabinol: POSITIVE — AB

## 2020-10-10 LAB — RESPIRATORY PANEL BY RT PCR (FLU A&B, COVID)
Influenza A by PCR: NEGATIVE
Influenza B by PCR: NEGATIVE
SARS Coronavirus 2 by RT PCR: NEGATIVE

## 2020-10-10 LAB — TROPONIN I (HIGH SENSITIVITY)
Troponin I (High Sensitivity): 17 ng/L (ref <18)
Troponin I (High Sensitivity): 16 ng/L (ref <18)

## 2020-10-10 LAB — HEPATIC FUNCTION PANEL
ALT: 65 U/L — ABNORMAL HIGH (ref 0–44)
AST: 36 U/L (ref 15–41)
Albumin: 4.5 g/dL (ref 3.5–5.0)
Alkaline Phosphatase: 120 U/L (ref 38–126)
Bilirubin, Direct: <0.1 mg/dL (ref 0.0–0.2)
Total Bilirubin: 1 mg/dL (ref 0.3–1.2)
Total Protein: 6.9 g/dL (ref 6.5–8.1)

## 2020-10-10 LAB — LIPASE, BLOOD: Lipase: 31 U/L (ref 11–51)

## 2020-10-10 IMAGING — DX DG CHEST 2V
3 series · 3 of 3 positions shown · non-contrast
Comparison: [DATE] and CT [DATE]

CLINICAL DATA: Chest pain and headache 4 days.

EXAM:
CHEST - 2 VIEW

[chest lat]
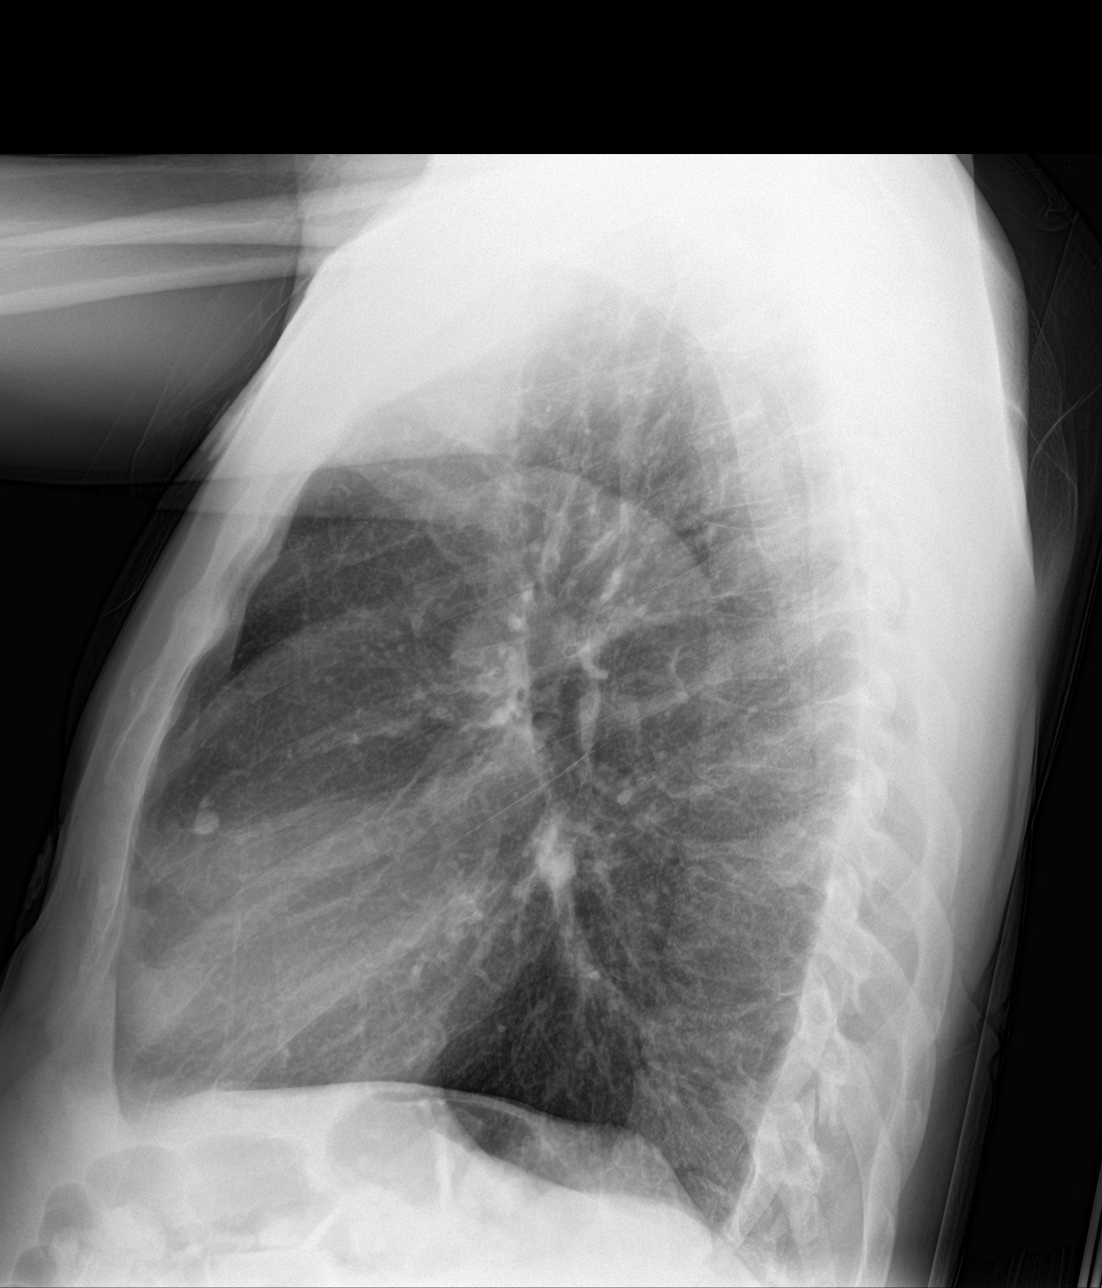

[chest ap (1 of 2)]
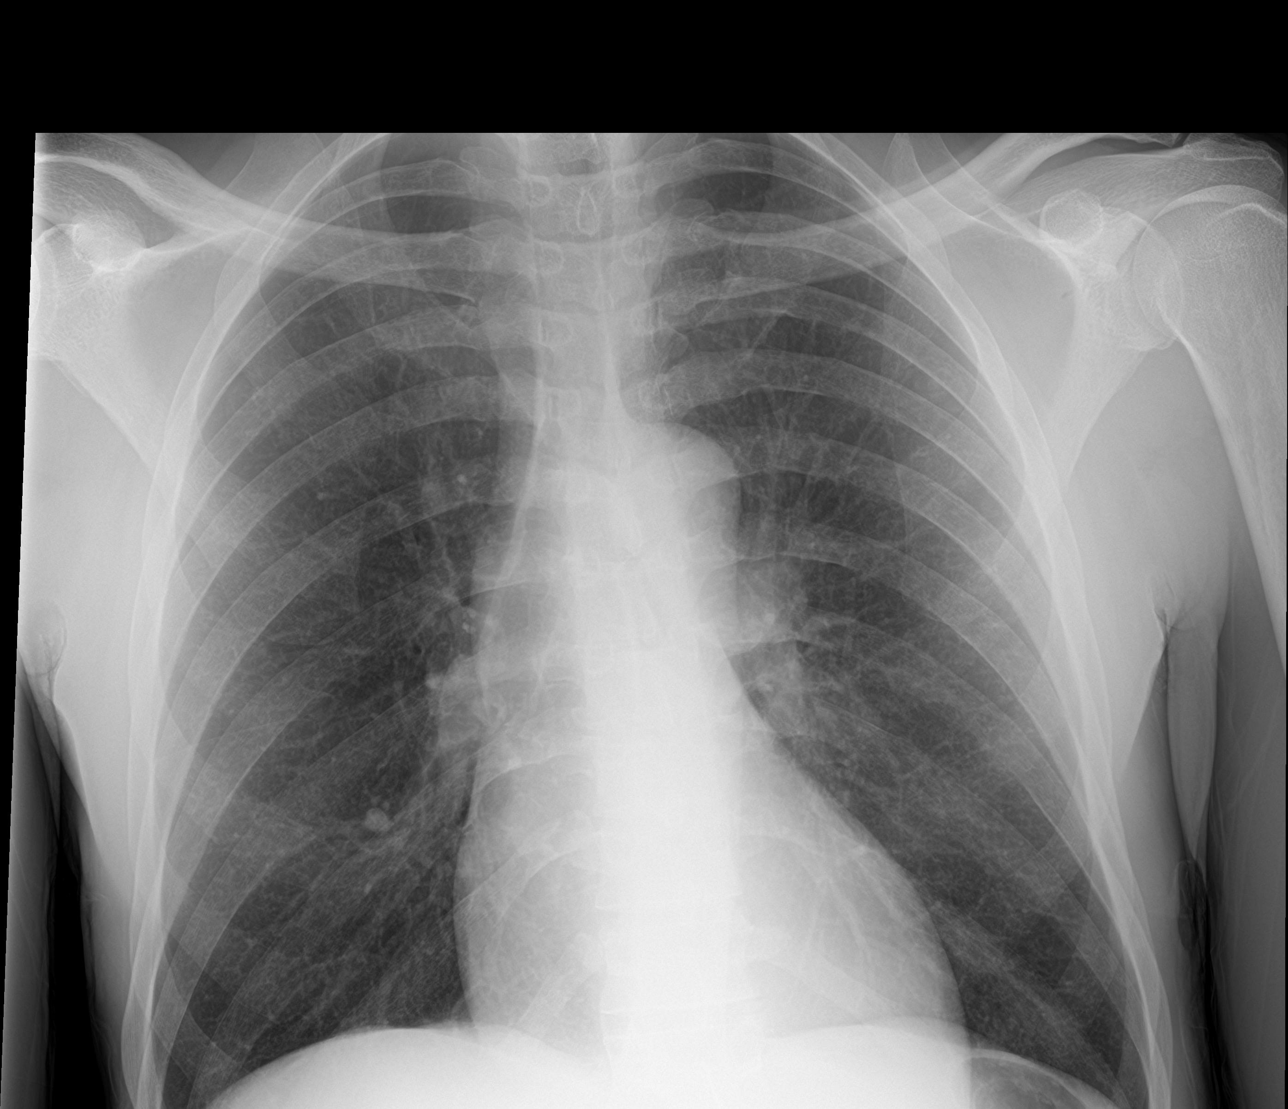

[chest ap (2 of 2)]
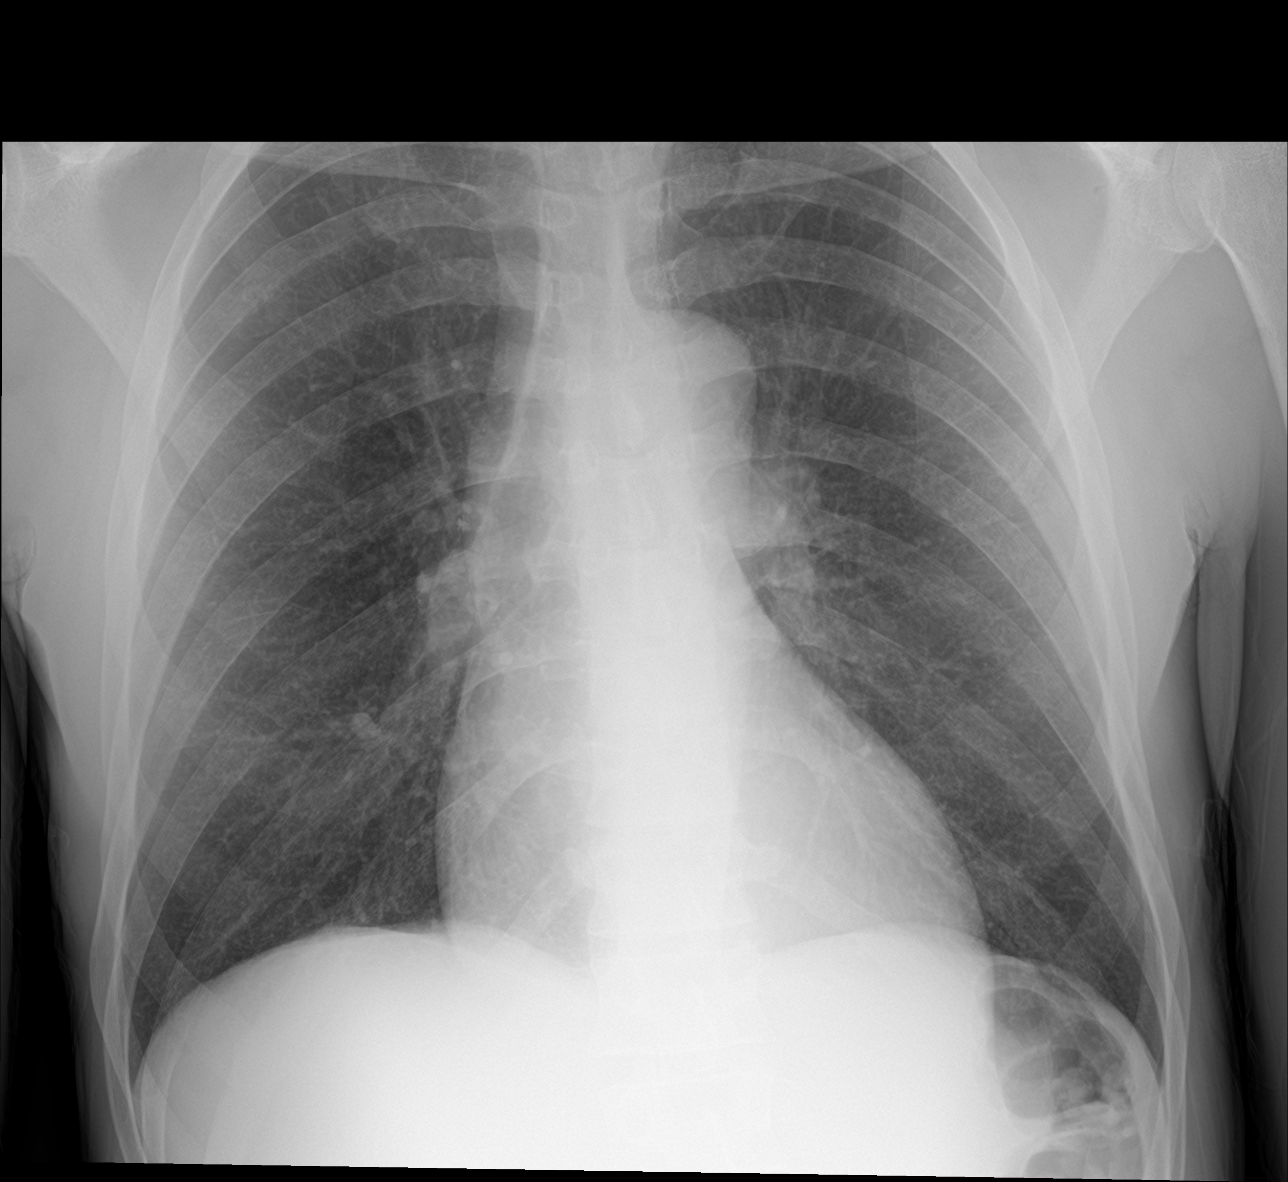

[3 of 3 positions shown; findings below may reference images not displayed]

FINDINGS: Lungs are adequately inflated without focal airspace consolidation
or effusion. Calcified granuloma over the right middle lobe.
Cardiomediastinal silhouette and remainder of the exam is unchanged.
IMPRESSION: No active cardiopulmonary disease.

## 2020-10-10 IMAGING — CT CT HEAD W/O CM
3 series · 14 of 47 positions shown, 16 images · non-contrast
Comparison: [DATE]

CLINICAL DATA: Headache, recent stroke

EXAM:
CT HEAD WITHOUT CONTRAST
TECHNIQUE: Contiguous axial images were obtained from the base of the skull
through the vertex without intravenous contrast.

[Series 3: head 5.0 h30s · axial · 0.47mm/px · z∈[-154,-19]mm · 8 of 33 slices shown, 10 images]
[im 3/33  brain]
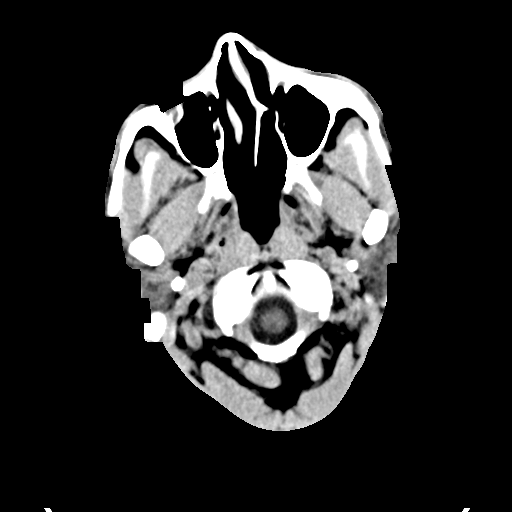
[im 3/33  bone]
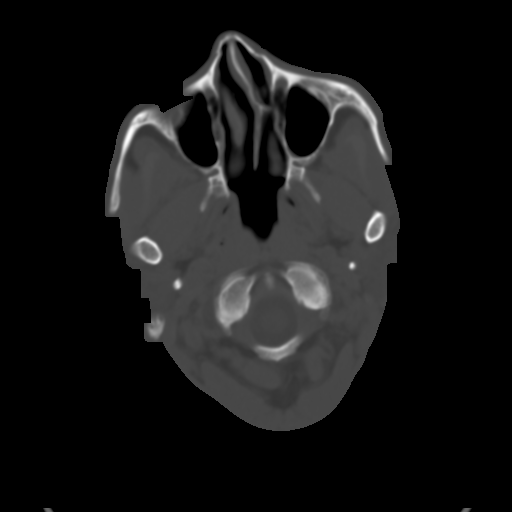
[im 7/33  brain]
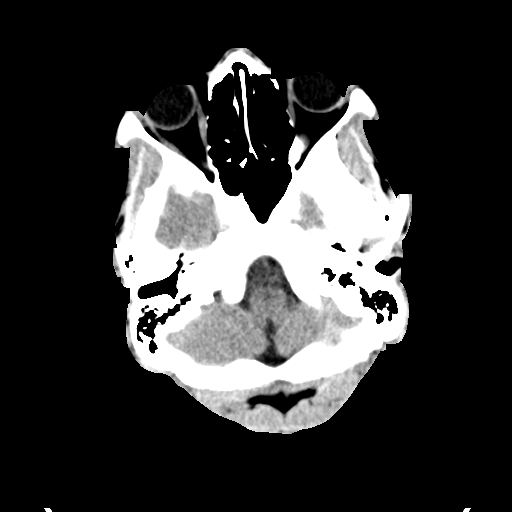
[im 10/33  brain]
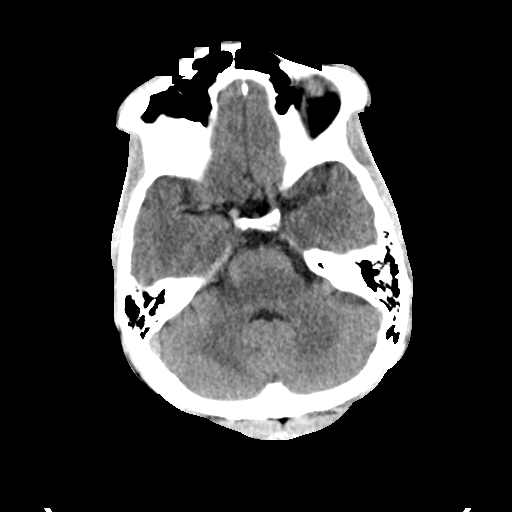
[im 15/33  brain]
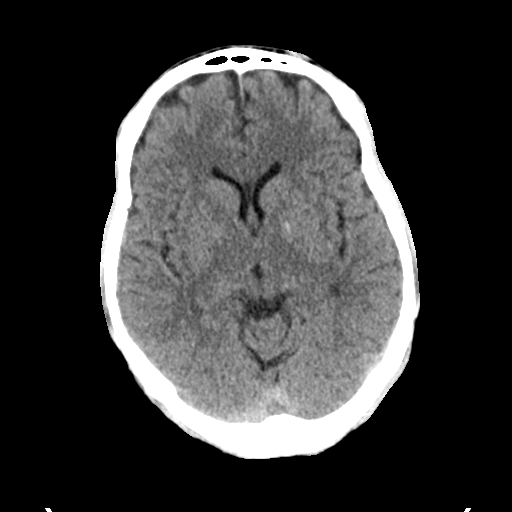
[im 18/33  brain]
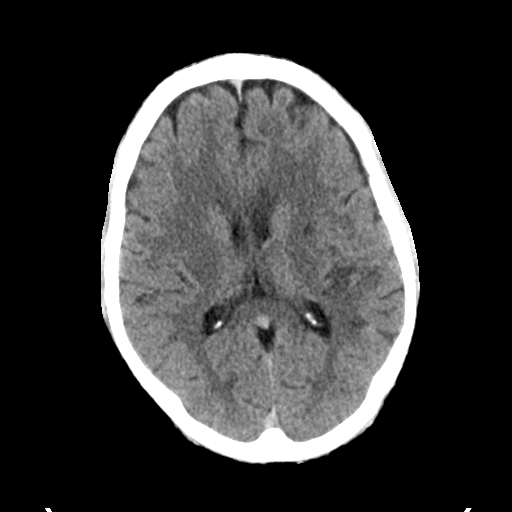
[im 18/33  bone]
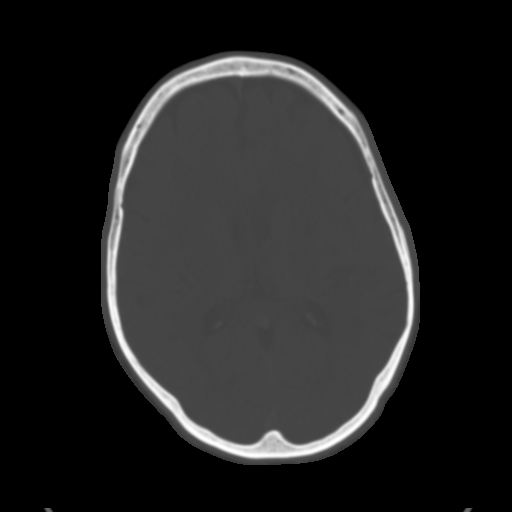
[im 23/33  brain]
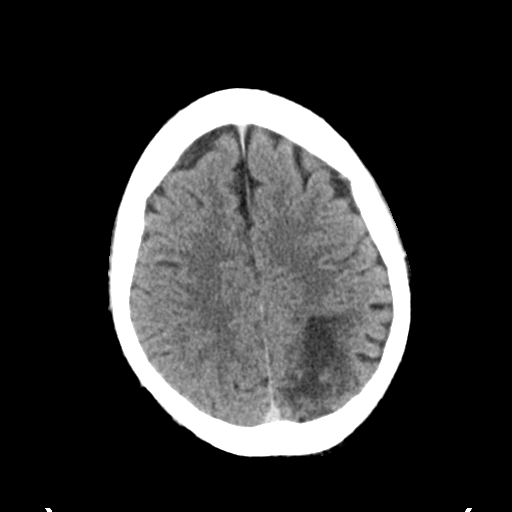
[im 26/33  brain]
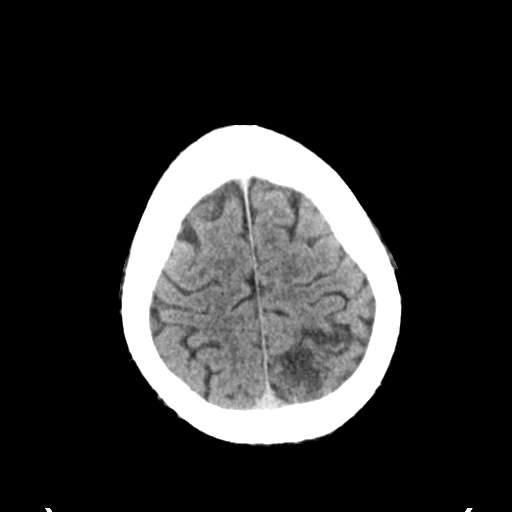
[im 30/33  brain]
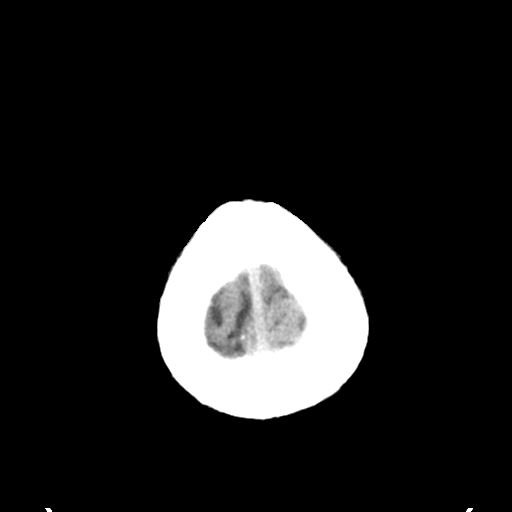

[Series 5: head 3.0 mpr cor · coronal · 0.32mm/px · 3 of 67 slices shown]
[im 23/67  brain]
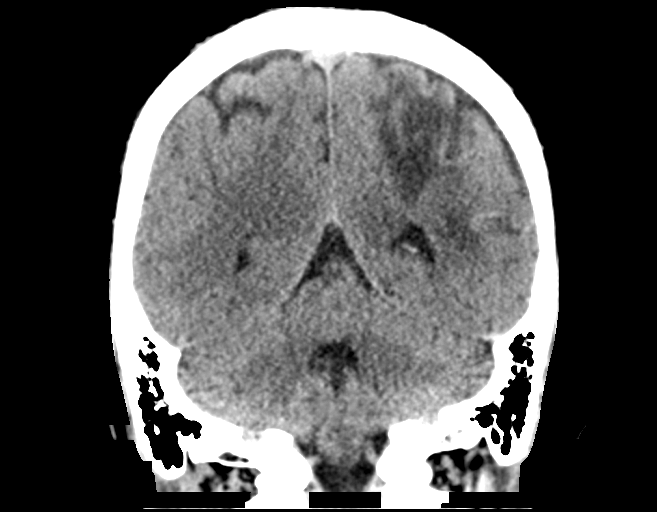
[im 30/67  brain]
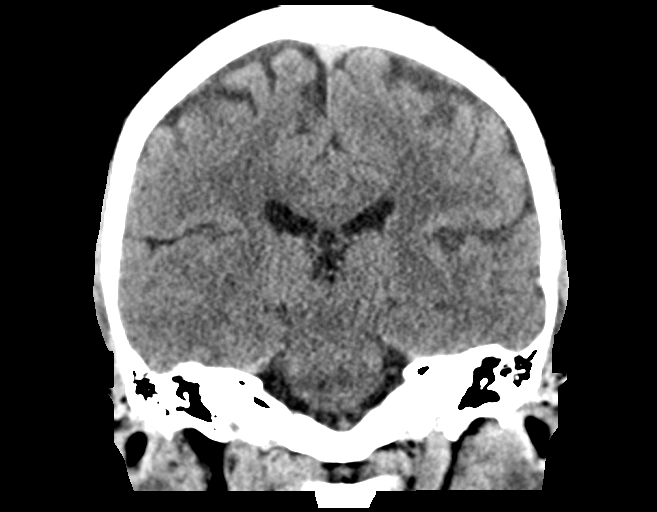
[im 37/67  brain]
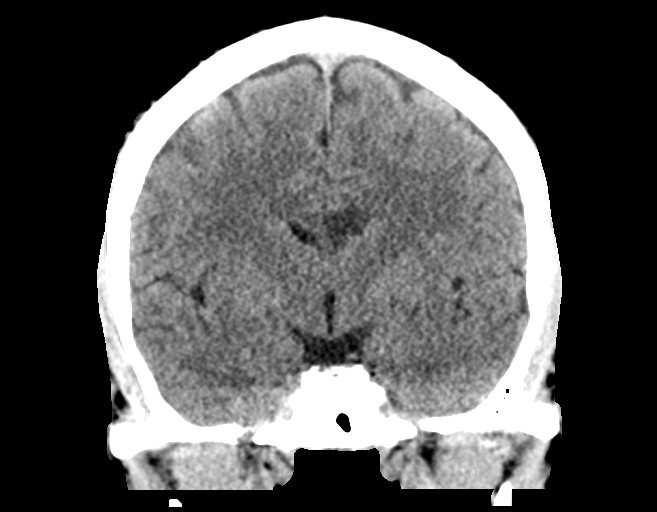

[Series 6: head 3.0 mpr sag · sagittal · 0.32mm/px · 3 of 64 slices shown]
[im 22/64  brain]
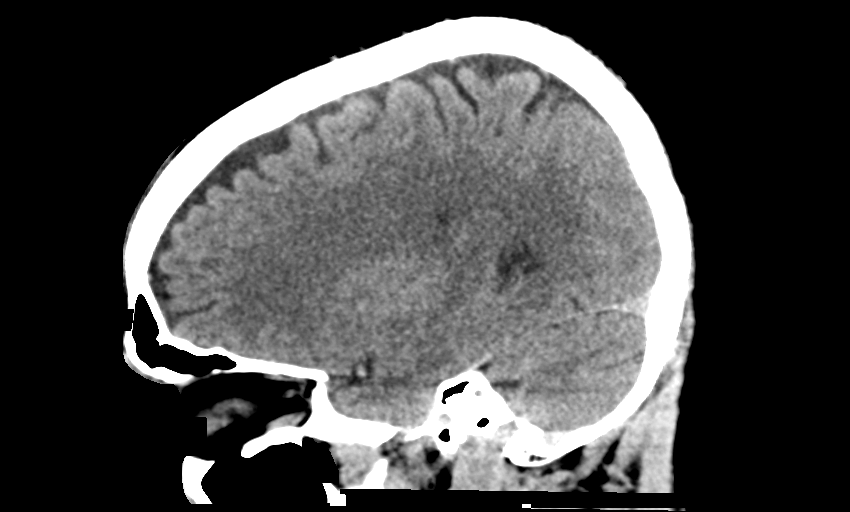
[im 32/64  brain]
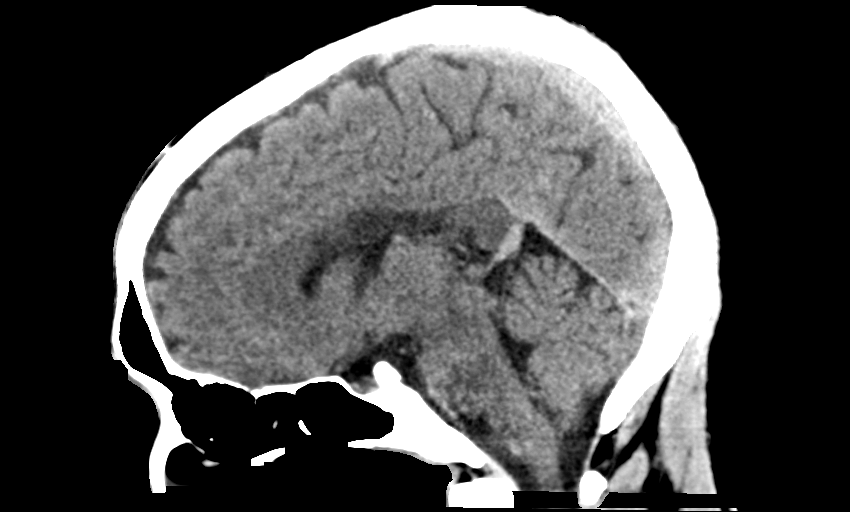
[im 43/64  brain]
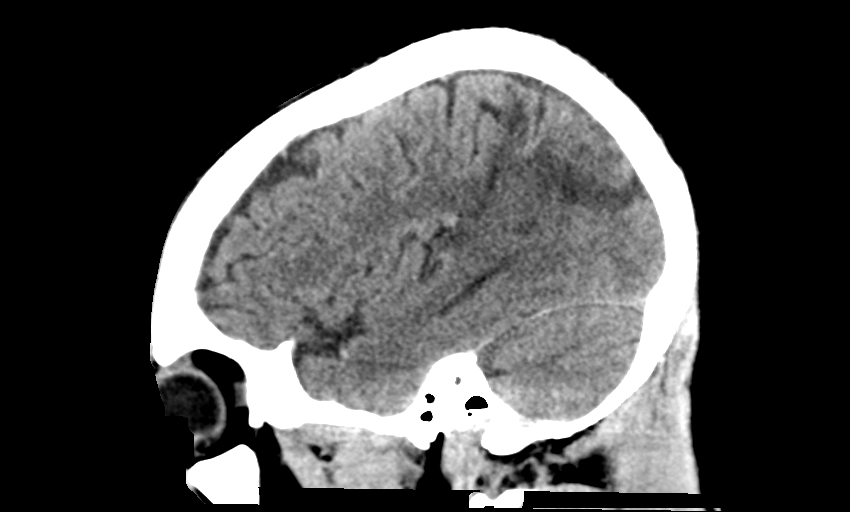

[14 of 47 positions shown; findings below may reference images not displayed]

FINDINGS: Brain: New hypoattenuation along the ventral body of the corpus
callosum eccentric to the left. Evolving recent infarcts of the
posterior left MCA territory and right corona radiata. Chronic
infarcts of the left corona radiata and right thalamus.

No acute intracranial hemorrhage or significant mass effect. No
hydrocephalus or extra-axial collection.

Vascular: No hyperdense vessel. There is atherosclerotic
calcification at the skull base.

Skull: Calvarium is unremarkable.

Sinuses/Orbits: No acute finding.

Other: None.
IMPRESSION: New hypoattenuation along the ventral body of the corpus callosum
eccentric to the left suspicious for acute or subacute infarction.

Evolving recent infarcts of the posterior left MCA territory and
right corona radiata.

No acute intracranial hemorrhage.

These results were called by telephone at the time of interpretation
on [DATE] at [DATE] to provider MAHASIN, who verbally
acknowledged these results.

## 2020-10-10 IMAGING — MR MR HEAD W/O CM
10 of 13 series · 35 of 48 positions shown · non-contrast
Comparison: Prior CT from earlier same day as well as earlier
studies.

CLINICAL DATA: Initial evaluation for neuro deficit, stroke
suspected.



[Series 5: DWI · axial · 3.0mm · 0.88mm/px · z∈[-70,+73]mm · 8 of 100 slices shown (1 of 4)]
[im 1/100]
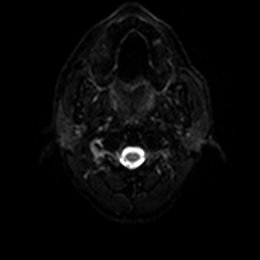
[im 15/100]
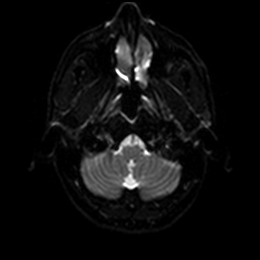
[im 29/100]
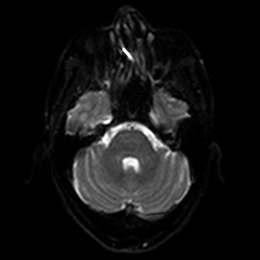
[im 43/100]
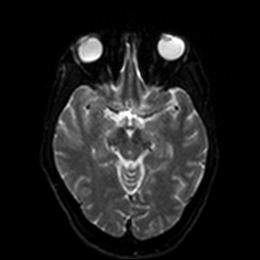
[im 57/100]
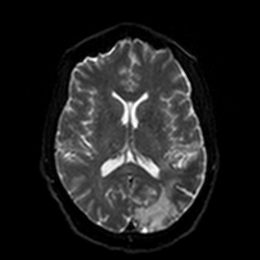
[im 71/100]
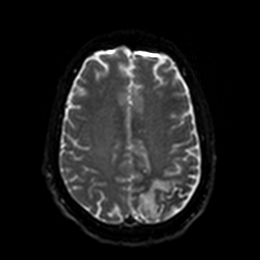
[im 85/100]
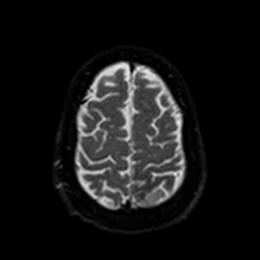
[im 100/100]
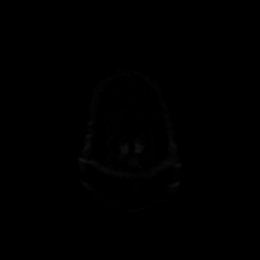

[Series 6: DWI · axial · 3.0mm · 0.88mm/px · z∈[-70,+73]mm · 4 of 50 slices shown (2 of 4)]
[im 1/50]
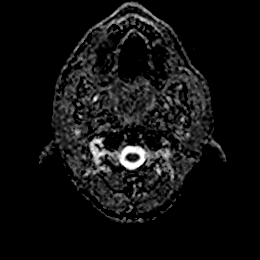
[im 17/50]
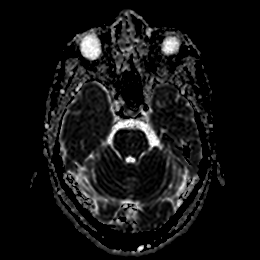
[im 33/50]
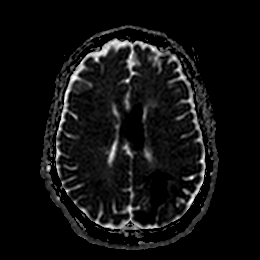
[im 50/50]
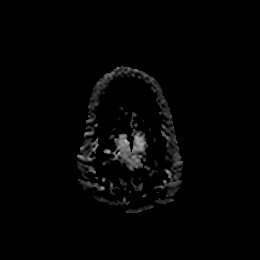

[Series 7: DWI · coronal · 4.0mm · 0.88mm/px · 5 of 70 slices shown (3 of 4)]
[im 1/70]
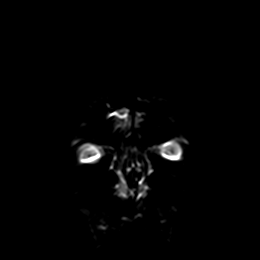
[im 18/70]
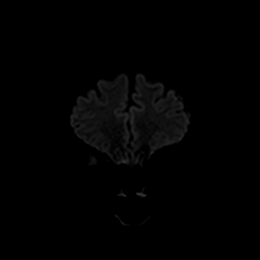
[im 35/70]
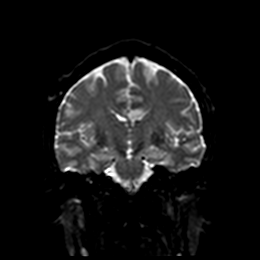
[im 52/70]
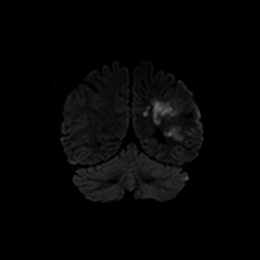
[im 70/70]
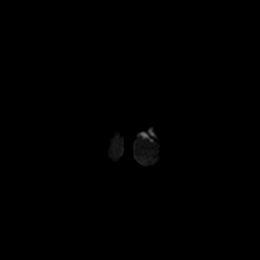

[Series 8: DWI · coronal · 4.0mm · 0.88mm/px · 3 of 35 slices shown (4 of 4)]
[im 1/35]
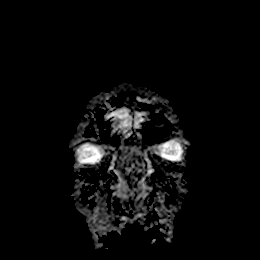
[im 18/35]
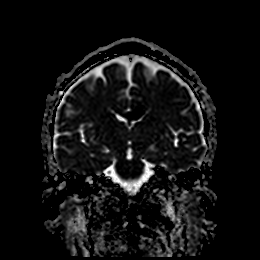
[im 35/35]
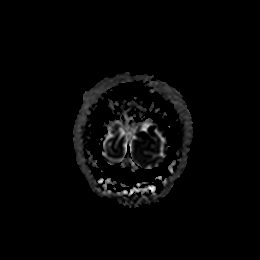

[Series 9: FLAIR · axial · 5.0mm · 0.45mm/px · z∈[-65,+76]mm · 2 of 25 slices shown]
[im 1/25]
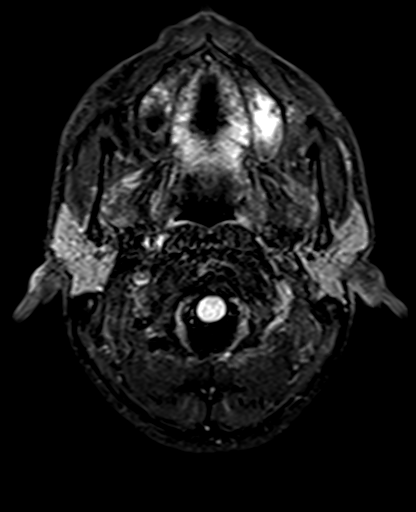
[im 25/25]
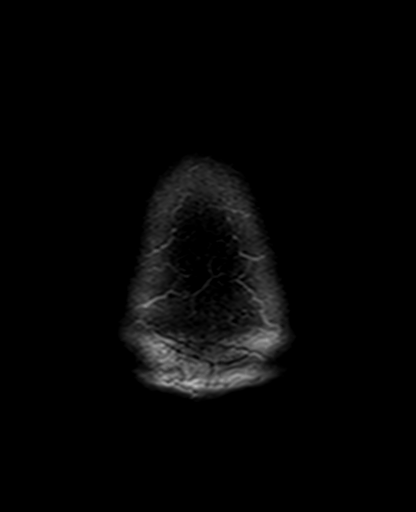

[Series 10: mag_images · axial · 3.0mm · 0.90mm/px · z∈[-81,+92]mm · 4 of 60 slices shown]
[im 1/60]
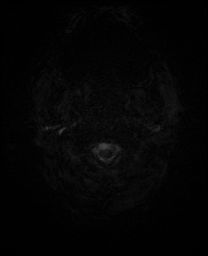
[im 20/60]
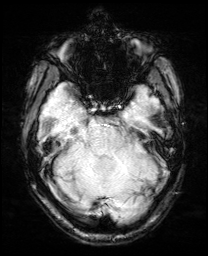
[im 40/60]
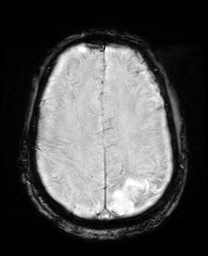
[im 60/60]
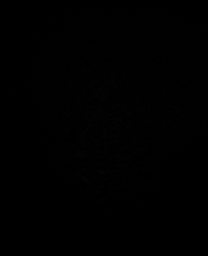

[Series 11: pha_images · axial · 3.0mm · 0.90mm/px · z∈[-81,+25]mm · 3 of 55 slices shown]
[im 1/55]
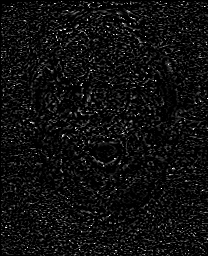
[im 19/55]
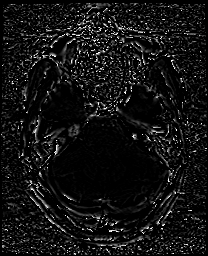
[im 37/55]
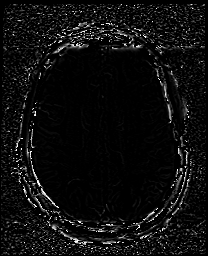

[Series 14: T1 · sagittal · 5.0mm · 0.75mm/px · 2 of 25 slices shown]
[im 1/25]
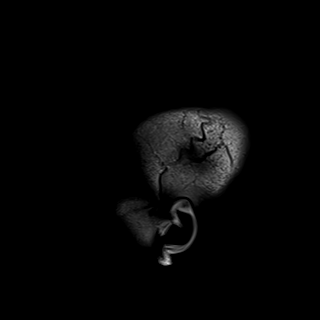
[im 25/25]
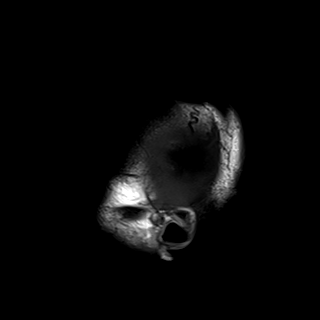

[Series 15: T2 · axial · 5.0mm · 0.72mm/px · z∈[-63,+78]mm · 2 of 25 slices shown (1 of 2)]
[im 1/25]
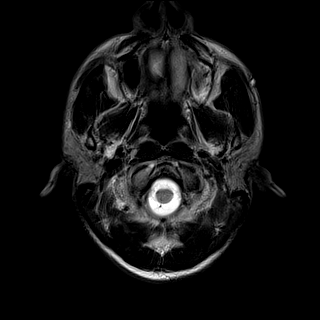
[im 25/25]
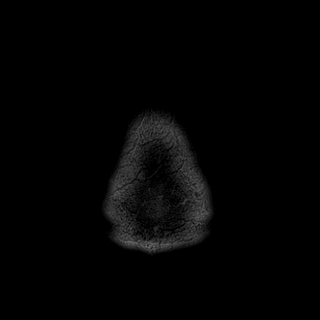

[Series 17: T2 · coronal · 5.0mm · 0.34mm/px · 2 of 31 slices shown (2 of 2)]
[im 1/31]
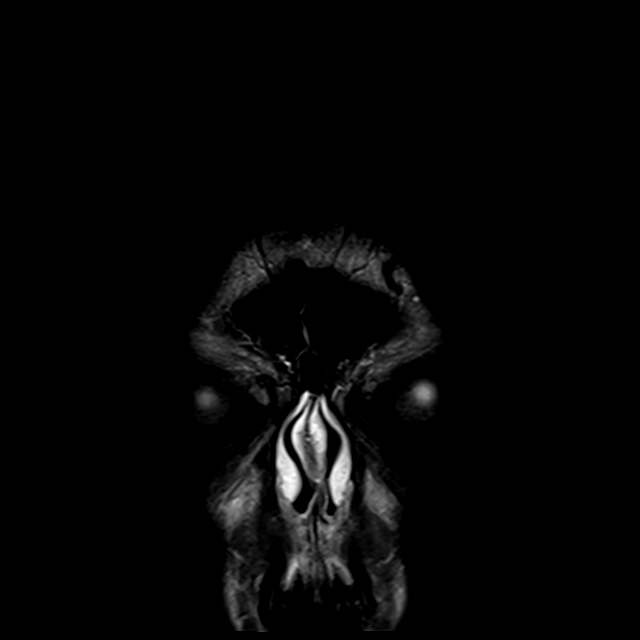
[im 31/31]
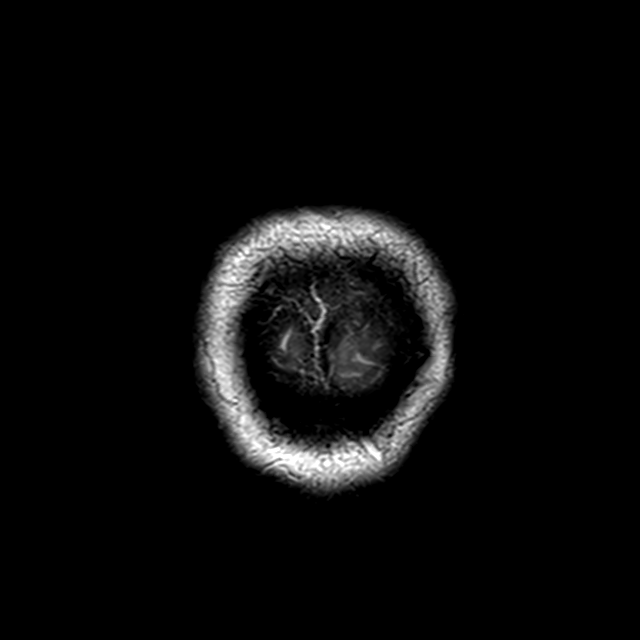

[35 of 48 positions shown; findings below may reference images not displayed]

FINDINGS: MRI HEAD FINDINGS

Brain: Cerebral volume within normal limits for age. Multiple
scattered remote lacunar infarcts noted about the left basal
ganglia/corona radiata and right thalamus.

Perforator type lacunar infarct involving the posterior right basal
ganglia/corona radiata demonstrates mild residual diffusion
abnormality without ADC correlate, consistent with a late subacute
ischemic infarct (series 5, image 83). No associated hemorrhage or
mass effect.

Confluent area of restricted diffusion involving the body of the
left corpus callosum measures 4.3 x 1.7 cm, consistent with an acute
left ACA territory infarct (series 5, image 83). Mild diffusion
abnormality/ischemia noted within the contralateral right corpus
callosum as well (series 6, image 34). No associated hemorrhage or
mass effect.

Patchy and confluent restricted diffusion involving the left
parieto-occipital region is seen (series 5, image 84). Associated
signal loss seen on ADC map (series 6, image 34), consistent with an
acute left MCA distribution infarct. These changes are superimposed
on underlying late subacute to chronic left MCA territory infarct.
Cortically based T1 hyperintensity and susceptibility artifact
consistent with laminar necrosis and/or petechial hemorrhage. No
significant regional mass effect.

Additional punctate acute ischemic nonhemorrhagic infarct noted
anteriorly within the left frontal centrum semi ovale (series 5,
image 90).

No other evidence for acute or subacute ischemia. Gray-white matter
differentiation otherwise maintained.

No mass lesion or midline shift. No hydrocephalus. Subtle 2 mm FLAIR
hyperintensity with associated susceptibility artifacts seen
overlying the left frontal convexity, consistent with a small
subdural hematoma, likely subacute in nature (series 9, image 16).
No associated mass effect. Additional faint component noted
posteriorly at the left parietal convexity.

Pituitary gland suprasellar region normal.

Vascular: Major intracranial vascular flow voids are maintained.

Skull and upper cervical spine: Craniocervical junction normal. Bone
marrow signal intensity within normal limits. No visible scalp soft
tissue injury.

Sinuses/Orbits: Globes and orbital soft tissues grossly within
normal limits. Scattered mucosal thickening noted within the
ethmoidal air cells and maxillary sinuses. Paranasal sinuses are
otherwise clear. No significant mastoid effusion. Inner ear
structures normal.

Other: None.

MRA HEAD FINDINGS

ANTERIOR CIRCULATION:

Examination mildly degraded by motion artifact.

Visualized distal cervical segments of the internal carotid arteries
are patent with antegrade flow. Petrous segments patent bilaterally.
Atheromatous irregularity within the cavernous/supraclinoid segments
without high-grade stenosis. A1 segments patent bilaterally. Normal
anterior communicating artery complex. There are severe near
occlusive stenoses involving the proximal A2 segments bilaterally
(series 18, image 114 on the right, series 18, image 117 on the
left). Additional severe downstream 8 3 stenosis (series 18, image
152). ACAs remain perfused to their distal aspects at this time.

No M1 stenosis or occlusion. Negative MCA bifurcations. Distal right
MCA branches remain perfused, although demonstrate advanced small
vessel atheromatous irregularity for age. On the left, there is
focal signal loss within a proximal left M2 branch, inferior
division (series 18, image 109). Finding could reflect severe
high-grade stenosis or possibly subocclusive thrombus. Left MCA
branches otherwise perfused, although demonstrate advanced
age-related small vessel atheromatous irregularity.

POSTERIOR CIRCULATION:

Visualized vertebral arteries patent to the vertebrobasilar junction
without stenosis. Left PICA patent proximally. Basilar patent to its
distal aspect without stenosis. Superior cerebral arteries patent
bilaterally. Predominant fetal type origin of the PCAs bilaterally.
There is a severe near occlusive stenosis involving the mid left P2
segment (series 26, image 8). PCAs otherwise patent without proximal
high-grade stenosis, although demonstrate extensive distal small
vessel atheromatous irregularity.

MRA NECK FINDINGS

AORTIC ARCH: Examination somewhat limited by lack of IV contrast.

Visualized aortic arch of normal caliber with normal branch pattern.
No hemodynamically significant stenosis seen about the origin of the
great vessels.

RIGHT CAROTID SYSTEM: Right CCA patent from its origin to the
bifurcation without stenosis. Mild atheromatous narrowing of no more
than 20% at the origin of the right ICA at the bifurcation. Right
ICA patent distally to the skull base without stenosis, evidence for
dissection, or occlusion.

LEFT CAROTID SYSTEM: Left CCA patent from its origin to the
bifurcation without stenosis. Atheromatous narrowing of up to
approximately 40% at the origin of the left ICA (series 38, image
5). Left ICA otherwise patent to the skull base without stenosis,
evidence for dissection or occlusion.

VERTEBRAL ARTERIES: Both vertebral arteries arise from the
subclavian arteries. Vertebral arteries diminutive, although the
right is slightly dominant. Vertebral arteries patent within the
neck without appreciable stenosis, evidence for dissection or
occlusion.
IMPRESSION: MRI HEAD IMPRESSION:

1. Acute ischemic nonhemorrhagic left ACA territory infarct
involving the body of the left corpus callosum
2. Additional patchy acute ischemic infarcts involving the left
parieto-occipital region, left MCA distribution. This is
superimposed on an underlying late subacute left MCA infarct at this
location. Evidence for associated laminar necrosis and/or petechial
hemorrhage.
3. Additional late subacute to chronic perforator type infarct
involving the posterior right basal ganglia/corona radiata.
4. Underlying multiple remote lacunar infarcts involving the left
basal ganglia/corona radiata and right thalamus.
5. Tiny 2 mm subdural hematoma overlying the left frontal convexity
as above, likely subacute in nature. No associated mass effect.

MRA HEAD IMPRESSION:

1. Focal signal loss within a proximal left M2 branch, inferior
division. Finding could reflect severe high-grade stenosis or
possibly subocclusive thrombus.
2. Severe near occlusive stenoses involving the proximal A2 and
distal A3 segments as above.
3. Severe mid left P2 stenosis. There is predominant fetal type
origin of the PCAs bilaterally.
4. Extensive small vessel atheromatous irregularity throughout the
intracranial circulation, advanced for age.

MRA NECK IMPRESSION:

1. Mild atheromatous narrowing about the origins of both ICAs,
measuring up to 40% on the left and 20% on the right.
2. Diminutive but patent vertebral arteries within the neck. Right
vertebral artery slightly dominant.

Case discussed by telephone at the time of interpretation on
[DATE] at [DATE] to provider TIGER, who verbally
acknowledged these results.

## 2020-10-10 IMAGING — MR MR MRA NECK W/O CM
4 of 5 series · 32 of 48 positions shown · non-contrast
Comparison: Prior CT from earlier same day as well as earlier
studies.

CLINICAL DATA: Initial evaluation for neuro deficit, stroke
suspected.



[Series 27: tof_fl3d_tra_iso · axial · 0.6mm · 0.52mm/px · z∈[-171,-99]mm · 21 of 133 slices shown]
[im 1/133]
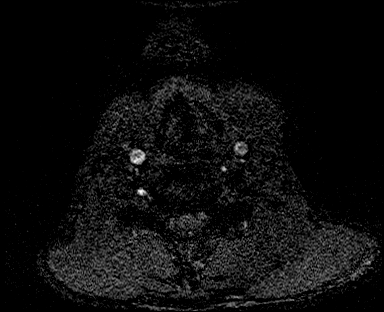
[im 6/133]
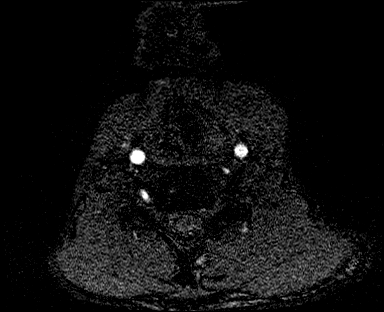
[im 12/133]
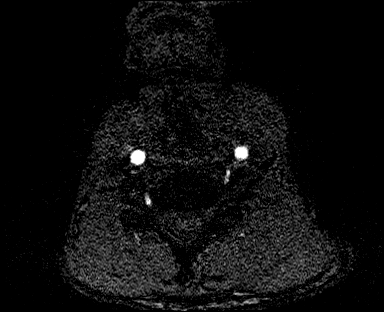
[im 17/133]
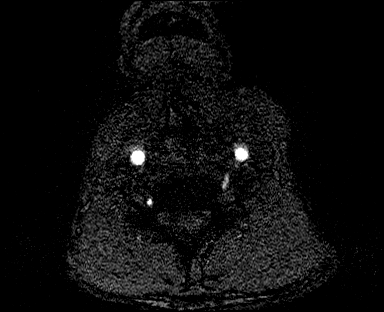
[im 23/133]
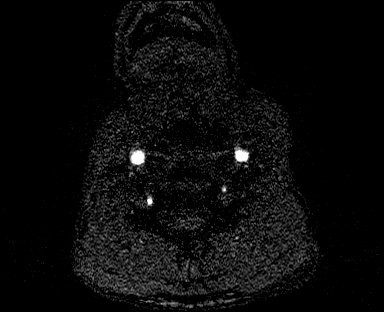
[im 28/133]
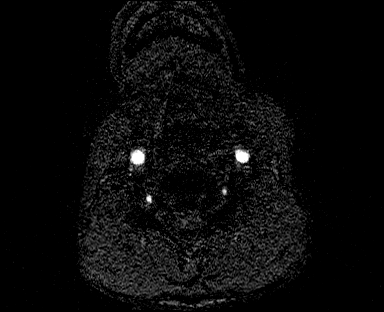
[im 34/133]
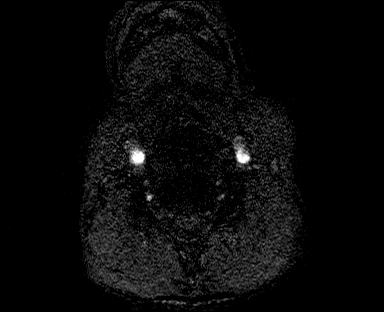
[im 39/133]
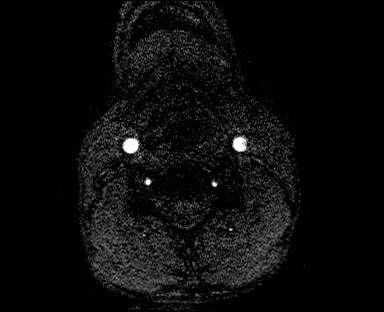
[im 45/133]
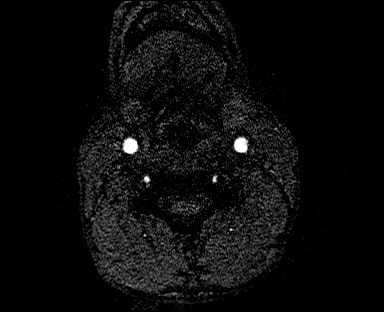
[im 50/133]
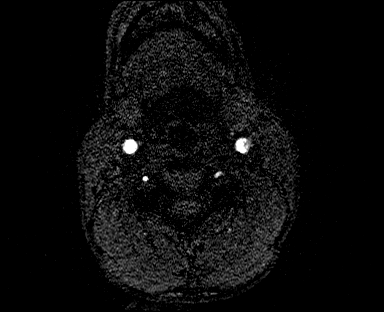
[im 56/133]
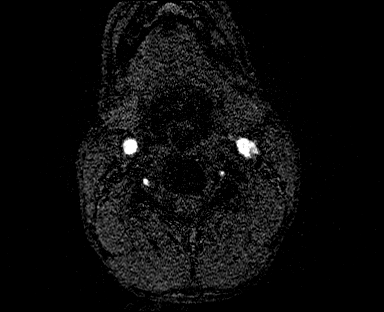
[im 61/133]
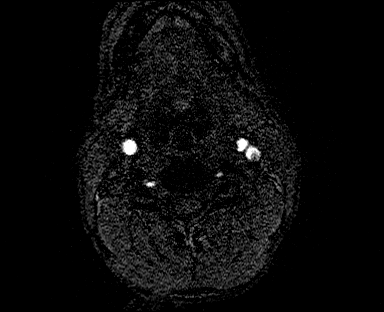
[im 67/133]
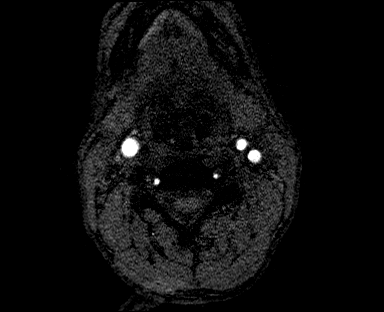
[im 72/133]
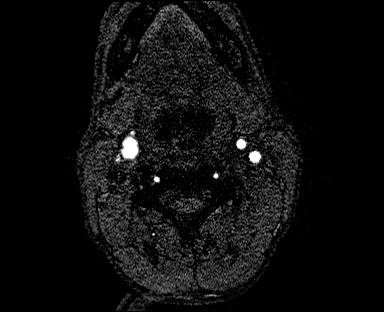
[im 78/133]
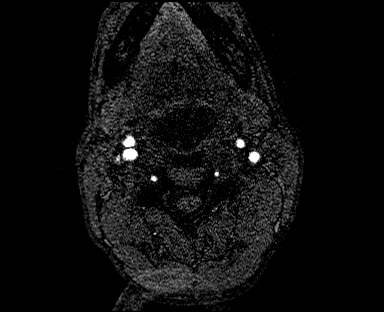
[im 83/133]
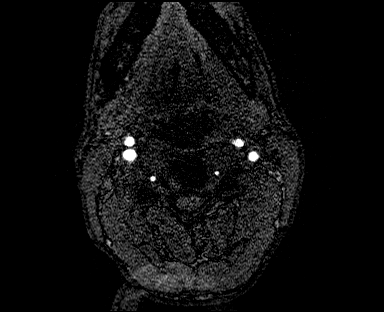
[im 89/133]
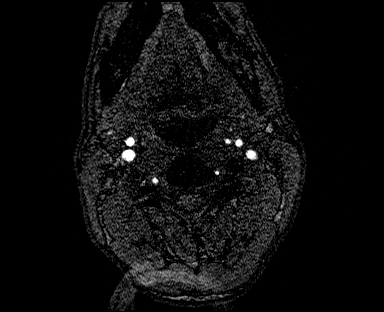
[im 94/133]
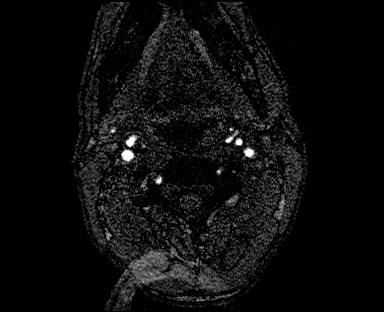
[im 100/133]
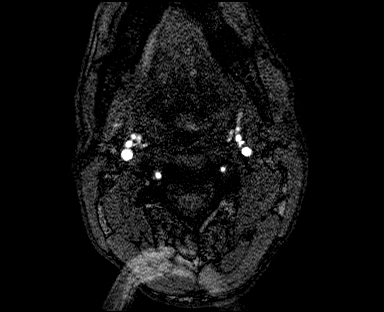
[im 111/133]
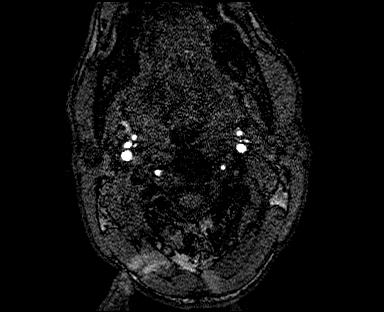
[im 127/133]
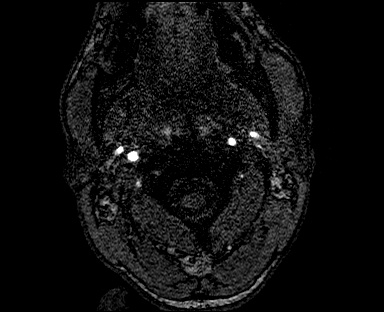

[Series 30: tof_fl2d_tra · axial · 3.0mm · 0.81mm/px · z∈[-273,-73]mm · 9 of 109 slices shown]
[im 6/109]
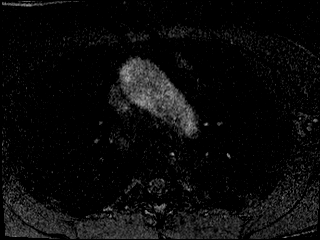
[im 18/109]
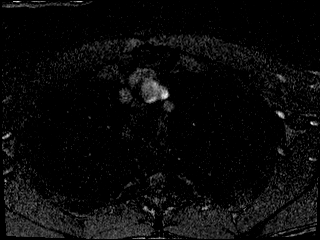
[im 35/109]
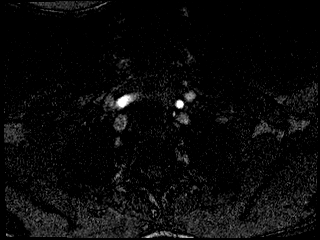
[im 46/109]
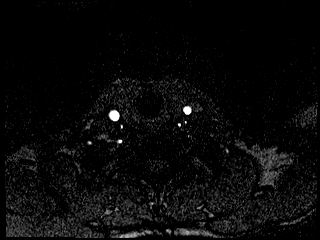
[im 57/109]
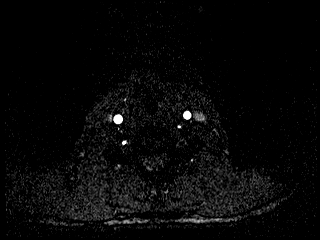
[im 63/109]
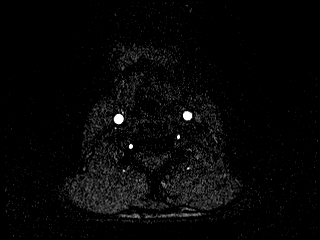
[im 74/109]
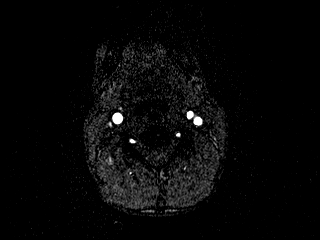
[im 91/109]
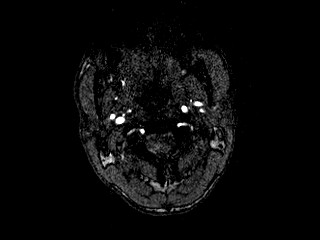
[im 103/109]
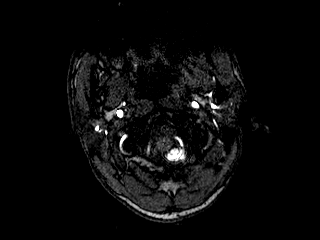

[Series 31: tof_fl2d_tra_mip_tra · axial · 229.8mm · 0.81mm/px · 1 of 1 slices shown]
[im 1/1]
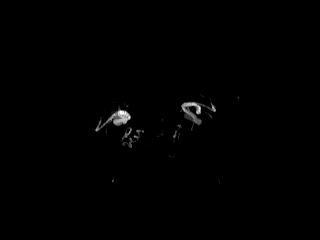

[Series 34: tof_fl2d_tra_mip_radials · axial · 0.81mm/px · 1 of 1 slices shown]
[im 1/1]
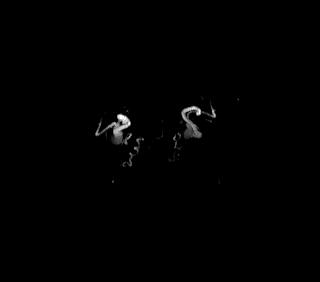

[32 of 48 positions shown; findings below may reference images not displayed]

FINDINGS: MRI HEAD FINDINGS

Brain: Cerebral volume within normal limits for age. Multiple
scattered remote lacunar infarcts noted about the left basal
ganglia/corona radiata and right thalamus.

Perforator type lacunar infarct involving the posterior right basal
ganglia/corona radiata demonstrates mild residual diffusion
abnormality without ADC correlate, consistent with a late subacute
ischemic infarct (series 5, image 83). No associated hemorrhage or
mass effect.

Confluent area of restricted diffusion involving the body of the
left corpus callosum measures 4.3 x 1.7 cm, consistent with an acute
left ACA territory infarct (series 5, image 83). Mild diffusion
abnormality/ischemia noted within the contralateral right corpus
callosum as well (series 6, image 34). No associated hemorrhage or
mass effect.

Patchy and confluent restricted diffusion involving the left
parieto-occipital region is seen (series 5, image 84). Associated
signal loss seen on ADC map (series 6, image 34), consistent with an
acute left MCA distribution infarct. These changes are superimposed
on underlying late subacute to chronic left MCA territory infarct.
Cortically based T1 hyperintensity and susceptibility artifact
consistent with laminar necrosis and/or petechial hemorrhage. No
significant regional mass effect.

Additional punctate acute ischemic nonhemorrhagic infarct noted
anteriorly within the left frontal centrum semi ovale (series 5,
image 90).

No other evidence for acute or subacute ischemia. Gray-white matter
differentiation otherwise maintained.

No mass lesion or midline shift. No hydrocephalus. Subtle 2 mm FLAIR
hyperintensity with associated susceptibility artifacts seen
overlying the left frontal convexity, consistent with a small
subdural hematoma, likely subacute in nature (series 9, image 16).
No associated mass effect. Additional faint component noted
posteriorly at the left parietal convexity.

Pituitary gland suprasellar region normal.

Vascular: Major intracranial vascular flow voids are maintained.

Skull and upper cervical spine: Craniocervical junction normal. Bone
marrow signal intensity within normal limits. No visible scalp soft
tissue injury.

Sinuses/Orbits: Globes and orbital soft tissues grossly within
normal limits. Scattered mucosal thickening noted within the
ethmoidal air cells and maxillary sinuses. Paranasal sinuses are
otherwise clear. No significant mastoid effusion. Inner ear
structures normal.

Other: None.

MRA HEAD FINDINGS

ANTERIOR CIRCULATION:

Examination mildly degraded by motion artifact.

Visualized distal cervical segments of the internal carotid arteries
are patent with antegrade flow. Petrous segments patent bilaterally.
Atheromatous irregularity within the cavernous/supraclinoid segments
without high-grade stenosis. A1 segments patent bilaterally. Normal
anterior communicating artery complex. There are severe near
occlusive stenoses involving the proximal A2 segments bilaterally
(series 18, image 114 on the right, series 18, image 117 on the
left). Additional severe downstream 8 3 stenosis (series 18, image
152). ACAs remain perfused to their distal aspects at this time.

No M1 stenosis or occlusion. Negative MCA bifurcations. Distal right
MCA branches remain perfused, although demonstrate advanced small
vessel atheromatous irregularity for age. On the left, there is
focal signal loss within a proximal left M2 branch, inferior
division (series 18, image 109). Finding could reflect severe
high-grade stenosis or possibly subocclusive thrombus. Left MCA
branches otherwise perfused, although demonstrate advanced
age-related small vessel atheromatous irregularity.

POSTERIOR CIRCULATION:

Visualized vertebral arteries patent to the vertebrobasilar junction
without stenosis. Left PICA patent proximally. Basilar patent to its
distal aspect without stenosis. Superior cerebral arteries patent
bilaterally. Predominant fetal type origin of the PCAs bilaterally.
There is a severe near occlusive stenosis involving the mid left P2
segment (series 26, image 8). PCAs otherwise patent without proximal
high-grade stenosis, although demonstrate extensive distal small
vessel atheromatous irregularity.

MRA NECK FINDINGS

AORTIC ARCH: Examination somewhat limited by lack of IV contrast.

Visualized aortic arch of normal caliber with normal branch pattern.
No hemodynamically significant stenosis seen about the origin of the
great vessels.

RIGHT CAROTID SYSTEM: Right CCA patent from its origin to the
bifurcation without stenosis. Mild atheromatous narrowing of no more
than 20% at the origin of the right ICA at the bifurcation. Right
ICA patent distally to the skull base without stenosis, evidence for
dissection, or occlusion.

LEFT CAROTID SYSTEM: Left CCA patent from its origin to the
bifurcation without stenosis. Atheromatous narrowing of up to
approximately 40% at the origin of the left ICA (series 38, image
5). Left ICA otherwise patent to the skull base without stenosis,
evidence for dissection or occlusion.

VERTEBRAL ARTERIES: Both vertebral arteries arise from the
subclavian arteries. Vertebral arteries diminutive, although the
right is slightly dominant. Vertebral arteries patent within the
neck without appreciable stenosis, evidence for dissection or
occlusion.
IMPRESSION: MRI HEAD IMPRESSION:

1. Acute ischemic nonhemorrhagic left ACA territory infarct
involving the body of the left corpus callosum
2. Additional patchy acute ischemic infarcts involving the left
parieto-occipital region, left MCA distribution. This is
superimposed on an underlying late subacute left MCA infarct at this
location. Evidence for associated laminar necrosis and/or petechial
hemorrhage.
3. Additional late subacute to chronic perforator type infarct
involving the posterior right basal ganglia/corona radiata.
4. Underlying multiple remote lacunar infarcts involving the left
basal ganglia/corona radiata and right thalamus.
5. Tiny 2 mm subdural hematoma overlying the left frontal convexity
as above, likely subacute in nature. No associated mass effect.

MRA HEAD IMPRESSION:

1. Focal signal loss within a proximal left M2 branch, inferior
division. Finding could reflect severe high-grade stenosis or
possibly subocclusive thrombus.
2. Severe near occlusive stenoses involving the proximal A2 and
distal A3 segments as above.
3. Severe mid left P2 stenosis. There is predominant fetal type
origin of the PCAs bilaterally.
4. Extensive small vessel atheromatous irregularity throughout the
intracranial circulation, advanced for age.

MRA NECK IMPRESSION:

1. Mild atheromatous narrowing about the origins of both ICAs,
measuring up to 40% on the left and 20% on the right.
2. Diminutive but patent vertebral arteries within the neck. Right
vertebral artery slightly dominant.

Case discussed by telephone at the time of interpretation on
[DATE] at [DATE] to provider TIGER, who verbally
acknowledged these results.

## 2020-10-10 IMAGING — MR MR MRA HEAD W/O CM
1 series · 14 of 48 positions shown · non-contrast
Comparison: Prior CT from earlier same day as well as earlier
studies.

CLINICAL DATA: Initial evaluation for neuro deficit, stroke
suspected.



[Series 18: 3d cow · axial · 0.5mm · 0.41mm/px · z∈[-54,+26]mm · 14 of 172 slices shown]
[im 1/172]
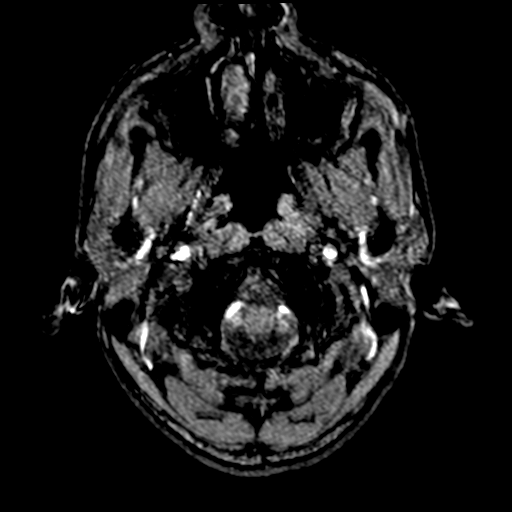
[im 4/172]
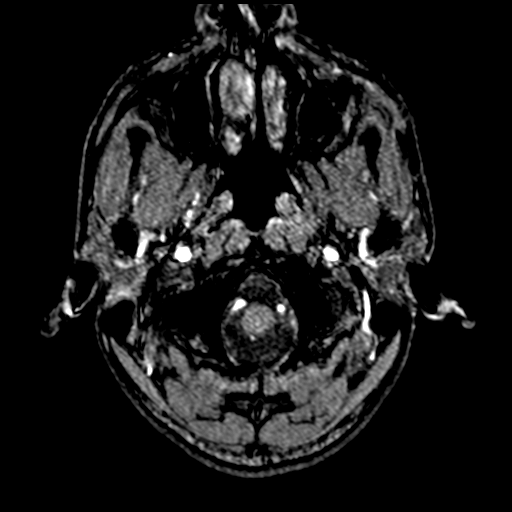
[im 8/172]
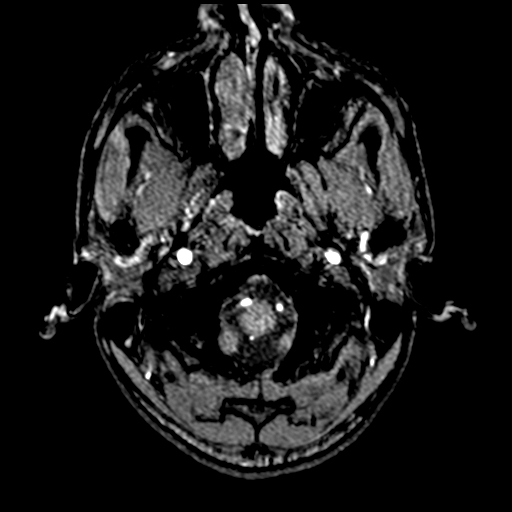
[im 11/172]
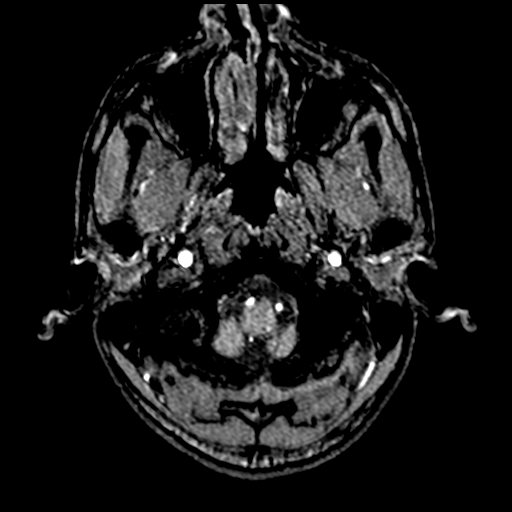
[im 30/172]
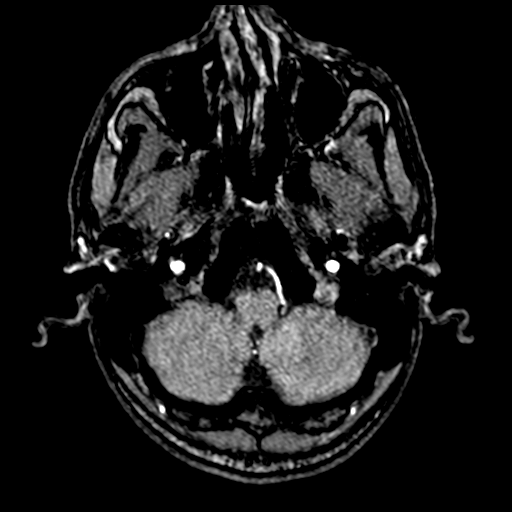
[im 33/172]
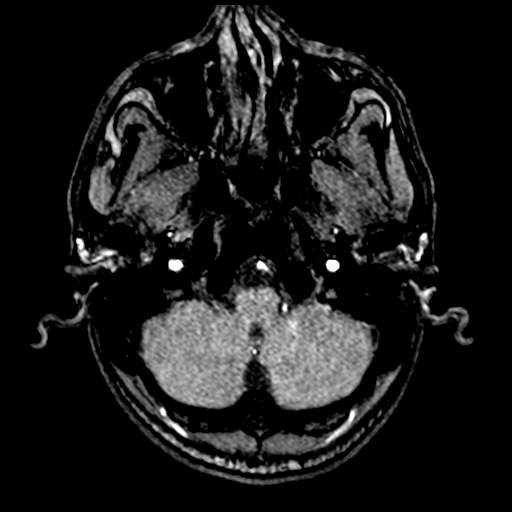
[im 55/172]
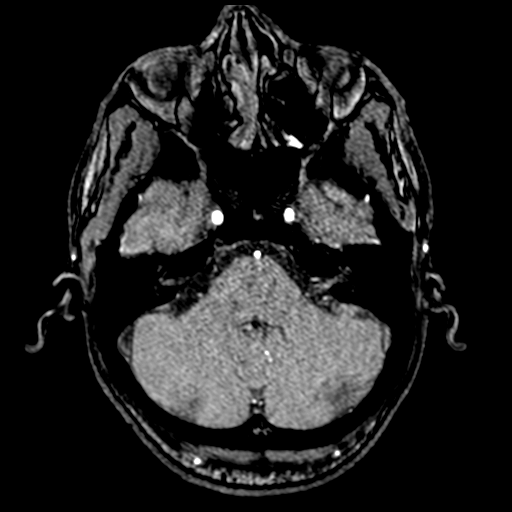
[im 77/172]
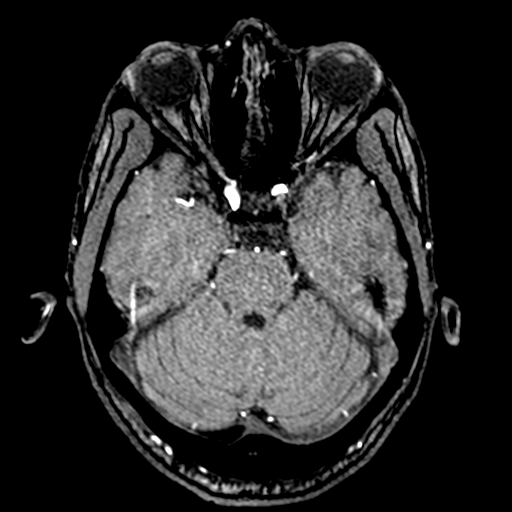
[im 88/172]
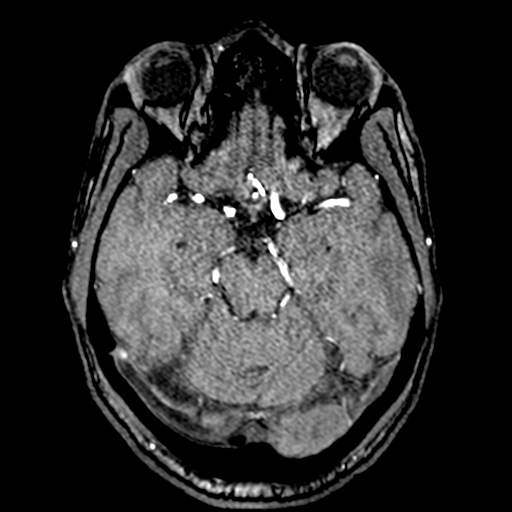
[im 99/172]
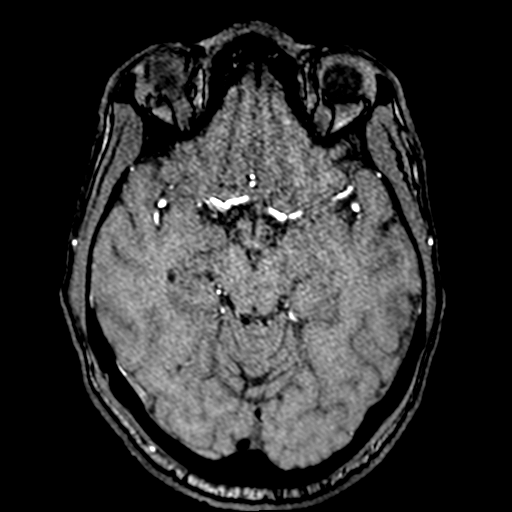
[im 121/172]
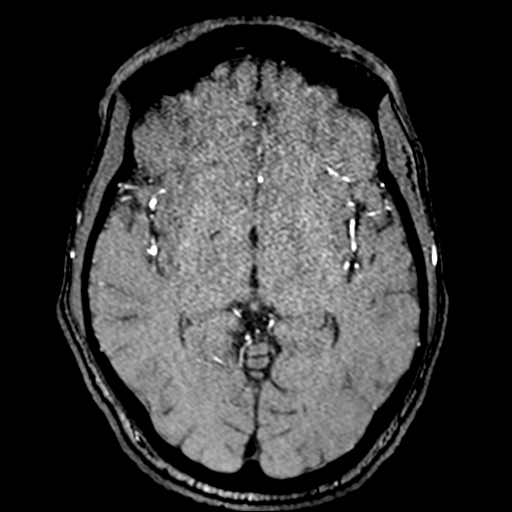
[im 142/172]
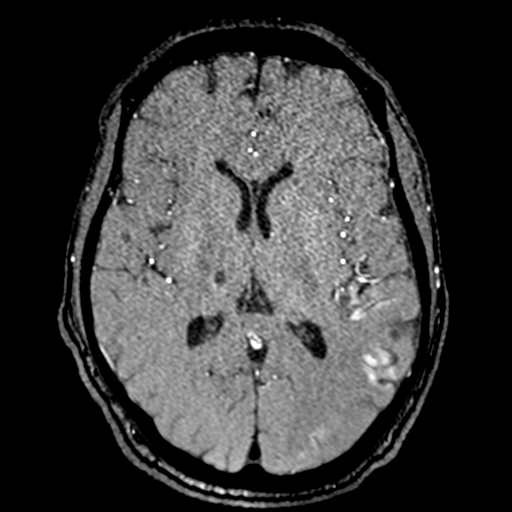
[im 146/172]
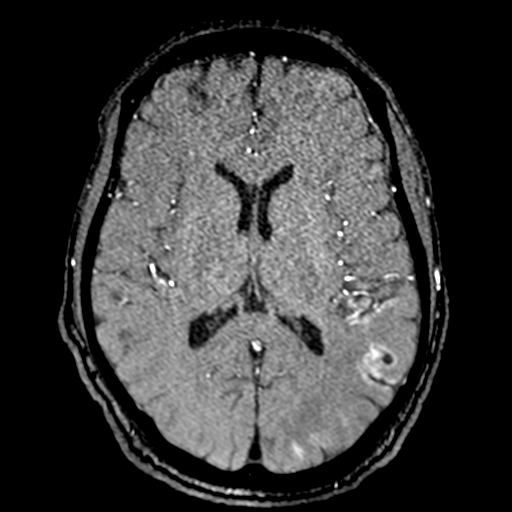
[im 164/172]
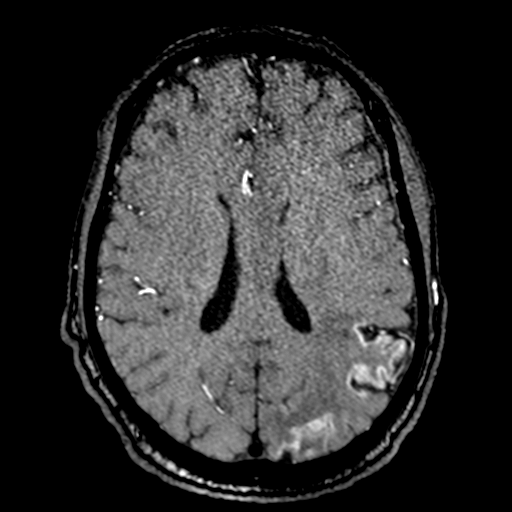

[14 of 48 positions shown; findings below may reference images not displayed]

FINDINGS: MRI HEAD FINDINGS

Brain: Cerebral volume within normal limits for age. Multiple
scattered remote lacunar infarcts noted about the left basal
ganglia/corona radiata and right thalamus.

Perforator type lacunar infarct involving the posterior right basal
ganglia/corona radiata demonstrates mild residual diffusion
abnormality without ADC correlate, consistent with a late subacute
ischemic infarct (series 5, image 83). No associated hemorrhage or
mass effect.

Confluent area of restricted diffusion involving the body of the
left corpus callosum measures 4.3 x 1.7 cm, consistent with an acute
left ACA territory infarct (series 5, image 83). Mild diffusion
abnormality/ischemia noted within the contralateral right corpus
callosum as well (series 6, image 34). No associated hemorrhage or
mass effect.

Patchy and confluent restricted diffusion involving the left
parieto-occipital region is seen (series 5, image 84). Associated
signal loss seen on ADC map (series 6, image 34), consistent with an
acute left MCA distribution infarct. These changes are superimposed
on underlying late subacute to chronic left MCA territory infarct.
Cortically based T1 hyperintensity and susceptibility artifact
consistent with laminar necrosis and/or petechial hemorrhage. No
significant regional mass effect.

Additional punctate acute ischemic nonhemorrhagic infarct noted
anteriorly within the left frontal centrum semi ovale (series 5,
image 90).

No other evidence for acute or subacute ischemia. Gray-white matter
differentiation otherwise maintained.

No mass lesion or midline shift. No hydrocephalus. Subtle 2 mm FLAIR
hyperintensity with associated susceptibility artifacts seen
overlying the left frontal convexity, consistent with a small
subdural hematoma, likely subacute in nature (series 9, image 16).
No associated mass effect. Additional faint component noted
posteriorly at the left parietal convexity.

Pituitary gland suprasellar region normal.

Vascular: Major intracranial vascular flow voids are maintained.

Skull and upper cervical spine: Craniocervical junction normal. Bone
marrow signal intensity within normal limits. No visible scalp soft
tissue injury.

Sinuses/Orbits: Globes and orbital soft tissues grossly within
normal limits. Scattered mucosal thickening noted within the
ethmoidal air cells and maxillary sinuses. Paranasal sinuses are
otherwise clear. No significant mastoid effusion. Inner ear
structures normal.

Other: None.

MRA HEAD FINDINGS

ANTERIOR CIRCULATION:

Examination mildly degraded by motion artifact.

Visualized distal cervical segments of the internal carotid arteries
are patent with antegrade flow. Petrous segments patent bilaterally.
Atheromatous irregularity within the cavernous/supraclinoid segments
without high-grade stenosis. A1 segments patent bilaterally. Normal
anterior communicating artery complex. There are severe near
occlusive stenoses involving the proximal A2 segments bilaterally
(series 18, image 114 on the right, series 18, image 117 on the
left). Additional severe downstream 8 3 stenosis (series 18, image
152). ACAs remain perfused to their distal aspects at this time.

No M1 stenosis or occlusion. Negative MCA bifurcations. Distal right
MCA branches remain perfused, although demonstrate advanced small
vessel atheromatous irregularity for age. On the left, there is
focal signal loss within a proximal left M2 branch, inferior
division (series 18, image 109). Finding could reflect severe
high-grade stenosis or possibly subocclusive thrombus. Left MCA
branches otherwise perfused, although demonstrate advanced
age-related small vessel atheromatous irregularity.

POSTERIOR CIRCULATION:

Visualized vertebral arteries patent to the vertebrobasilar junction
without stenosis. Left PICA patent proximally. Basilar patent to its
distal aspect without stenosis. Superior cerebral arteries patent
bilaterally. Predominant fetal type origin of the PCAs bilaterally.
There is a severe near occlusive stenosis involving the mid left P2
segment (series 26, image 8). PCAs otherwise patent without proximal
high-grade stenosis, although demonstrate extensive distal small
vessel atheromatous irregularity.

MRA NECK FINDINGS

AORTIC ARCH: Examination somewhat limited by lack of IV contrast.

Visualized aortic arch of normal caliber with normal branch pattern.
No hemodynamically significant stenosis seen about the origin of the
great vessels.

RIGHT CAROTID SYSTEM: Right CCA patent from its origin to the
bifurcation without stenosis. Mild atheromatous narrowing of no more
than 20% at the origin of the right ICA at the bifurcation. Right
ICA patent distally to the skull base without stenosis, evidence for
dissection, or occlusion.

LEFT CAROTID SYSTEM: Left CCA patent from its origin to the
bifurcation without stenosis. Atheromatous narrowing of up to
approximately 40% at the origin of the left ICA (series 38, image
5). Left ICA otherwise patent to the skull base without stenosis,
evidence for dissection or occlusion.

VERTEBRAL ARTERIES: Both vertebral arteries arise from the
subclavian arteries. Vertebral arteries diminutive, although the
right is slightly dominant. Vertebral arteries patent within the
neck without appreciable stenosis, evidence for dissection or
occlusion.
IMPRESSION: MRI HEAD IMPRESSION:

1. Acute ischemic nonhemorrhagic left ACA territory infarct
involving the body of the left corpus callosum
2. Additional patchy acute ischemic infarcts involving the left
parieto-occipital region, left MCA distribution. This is
superimposed on an underlying late subacute left MCA infarct at this
location. Evidence for associated laminar necrosis and/or petechial
hemorrhage.
3. Additional late subacute to chronic perforator type infarct
involving the posterior right basal ganglia/corona radiata.
4. Underlying multiple remote lacunar infarcts involving the left
basal ganglia/corona radiata and right thalamus.
5. Tiny 2 mm subdural hematoma overlying the left frontal convexity
as above, likely subacute in nature. No associated mass effect.

MRA HEAD IMPRESSION:

1. Focal signal loss within a proximal left M2 branch, inferior
division. Finding could reflect severe high-grade stenosis or
possibly subocclusive thrombus.
2. Severe near occlusive stenoses involving the proximal A2 and
distal A3 segments as above.
3. Severe mid left P2 stenosis. There is predominant fetal type
origin of the PCAs bilaterally.
4. Extensive small vessel atheromatous irregularity throughout the
intracranial circulation, advanced for age.

MRA NECK IMPRESSION:

1. Mild atheromatous narrowing about the origins of both ICAs,
measuring up to 40% on the left and 20% on the right.
2. Diminutive but patent vertebral arteries within the neck. Right
vertebral artery slightly dominant.

Case discussed by telephone at the time of interpretation on
[DATE] at [DATE] to provider TIGER, who verbally
acknowledged these results.

## 2020-10-10 MED ORDER — LORAZEPAM 2 MG/ML IJ SOLN
INTRAMUSCULAR | Status: AC
  Filled 2020-10-10: qty 1

## 2020-10-10 MED ORDER — PANTOPRAZOLE SODIUM 40 MG PO TBEC
40.0000 mg | DELAYED_RELEASE_TABLET | Freq: Every day | ORAL | Status: DC
  Administered 2020-10-10 – 2020-10-11 (×2): 40 mg via ORAL
  Filled 2020-10-10 (×2): qty 1

## 2020-10-10 MED ORDER — OXYCODONE HCL 5 MG PO TABS
10.0000 mg | ORAL_TABLET | Freq: Once | ORAL | Status: AC
  Administered 2020-10-10: 10 mg via ORAL
  Filled 2020-10-10: qty 2

## 2020-10-10 MED ORDER — LORAZEPAM 1 MG PO TABS
1.0000 mg | ORAL_TABLET | Freq: Once | ORAL | Status: AC
  Administered 2020-10-10: 1 mg via ORAL

## 2020-10-10 MED ORDER — FENTANYL CITRATE (PF) 100 MCG/2ML IJ SOLN
50.0000 ug | Freq: Once | INTRAMUSCULAR | Status: AC
  Administered 2020-10-10: 50 ug via INTRAVENOUS
  Filled 2020-10-10 (×2): qty 2

## 2020-10-10 MED ORDER — ALUM & MAG HYDROXIDE-SIMETH 200-200-20 MG/5ML PO SUSP
30.0000 mL | Freq: Once | ORAL | Status: DC
  Filled 2020-10-10 (×3): qty 30

## 2020-10-10 MED ORDER — VITAMIN B-12 1000 MCG PO TABS
1000.0000 ug | ORAL_TABLET | Freq: Every day | ORAL | Status: DC
  Administered 2020-10-11 – 2020-10-15 (×5): 1000 ug via ORAL
  Filled 2020-10-10 (×5): qty 1

## 2020-10-10 MED ORDER — CLOPIDOGREL BISULFATE 75 MG PO TABS
75.0000 mg | ORAL_TABLET | Freq: Every day | ORAL | Status: DC
  Administered 2020-10-10: 75 mg via ORAL
  Filled 2020-10-10: qty 1

## 2020-10-10 MED ORDER — AMLODIPINE BESYLATE 10 MG PO TABS
10.0000 mg | ORAL_TABLET | Freq: Every day | ORAL | Status: DC
  Administered 2020-10-10 – 2020-10-15 (×6): 10 mg via ORAL
  Filled 2020-10-10 (×3): qty 1
  Filled 2020-10-10 (×2): qty 2
  Filled 2020-10-10: qty 1

## 2020-10-10 MED ORDER — ACETAMINOPHEN 325 MG PO TABS
650.0000 mg | ORAL_TABLET | Freq: Four times a day (QID) | ORAL | Status: DC | PRN
  Administered 2020-10-11 – 2020-10-13 (×5): 650 mg via ORAL
  Filled 2020-10-10 (×6): qty 2

## 2020-10-10 MED ORDER — ATORVASTATIN CALCIUM 40 MG PO TABS
40.0000 mg | ORAL_TABLET | Freq: Every day | ORAL | Status: DC
  Administered 2020-10-10 – 2020-10-15 (×6): 40 mg via ORAL
  Filled 2020-10-10: qty 1
  Filled 2020-10-10: qty 4
  Filled 2020-10-10 (×4): qty 1

## 2020-10-10 MED ORDER — ACETAMINOPHEN 325 MG PO TABS
650.0000 mg | ORAL_TABLET | Freq: Once | ORAL | Status: AC
  Administered 2020-10-10: 650 mg via ORAL
  Filled 2020-10-10: qty 2

## 2020-10-10 MED ORDER — FUROSEMIDE 40 MG PO TABS
40.0000 mg | ORAL_TABLET | Freq: Every day | ORAL | Status: DC
  Administered 2020-10-10 – 2020-10-15 (×6): 40 mg via ORAL
  Filled 2020-10-10 (×2): qty 2
  Filled 2020-10-10 (×4): qty 1

## 2020-10-10 MED ORDER — HYDROCHLOROTHIAZIDE 12.5 MG PO CAPS: 12.5000 mg | ORAL_CAPSULE | Freq: Every day | ORAL | Status: DC

## 2020-10-10 MED ORDER — LORAZEPAM 1 MG PO TABS
1.0000 mg | ORAL_TABLET | Freq: Two times a day (BID) | ORAL | Status: AC
  Administered 2020-10-10 – 2020-10-12 (×4): 1 mg via ORAL
  Filled 2020-10-10 (×4): qty 1

## 2020-10-10 MED ORDER — ADULT MULTIVITAMIN W/MINERALS CH
1.0000 | ORAL_TABLET | Freq: Every day | ORAL | Status: DC
  Administered 2020-10-10 – 2020-10-15 (×6): 1 via ORAL
  Filled 2020-10-10 (×6): qty 1

## 2020-10-10 MED ORDER — HEPARIN SODIUM (PORCINE) 5000 UNIT/ML IJ SOLN
5000.0000 [IU] | Freq: Two times a day (BID) | INTRAMUSCULAR | Status: DC
  Administered 2020-10-10: 5000 [IU] via SUBCUTANEOUS
  Filled 2020-10-10: qty 1

## 2020-10-10 MED ORDER — CARVEDILOL 3.125 MG PO TABS
3.1250 mg | ORAL_TABLET | Freq: Two times a day (BID) | ORAL | Status: DC
  Administered 2020-10-11 – 2020-10-13 (×5): 3.125 mg via ORAL
  Filled 2020-10-10 (×7): qty 1

## 2020-10-10 MED ORDER — FOLIC ACID 1 MG PO TABS
1.0000 mg | ORAL_TABLET | Freq: Every day | ORAL | Status: DC
  Administered 2020-10-10 – 2020-10-15 (×6): 1 mg via ORAL
  Filled 2020-10-10 (×6): qty 1

## 2020-10-10 MED ORDER — STROKE: EARLY STAGES OF RECOVERY BOOK
Freq: Once | Status: DC
  Filled 2020-10-10 (×2): qty 1

## 2020-10-10 MED ORDER — LIDOCAINE VISCOUS HCL 2 % MT SOLN
15.0000 mL | Freq: Once | OROMUCOSAL | Status: DC
  Filled 2020-10-10 (×2): qty 15

## 2020-10-10 MED ORDER — ASPIRIN EC 81 MG PO TBEC
81.0000 mg | DELAYED_RELEASE_TABLET | Freq: Every day | ORAL | Status: DC
  Administered 2020-10-10: 81 mg via ORAL
  Filled 2020-10-10: qty 1

## 2020-10-10 MED ORDER — HYDRALAZINE HCL 25 MG PO TABS: 25.0000 mg | ORAL_TABLET | Freq: Four times a day (QID) | ORAL | Status: DC | PRN

## 2020-10-10 NOTE — ED Notes (Signed)
Neurologist at bedside, pt resting comfortably, continues expressive aphasia, aao2, [phone]. Hypertensive on monitor but otherwise vitals stable. Sinus rhythm. Able to follow commands. Breathing nonlabored with symmetrical chest rise bilaterally. Call bell in reach, side rails up.

## 2020-10-10 NOTE — H&P (Signed)
History and Physical    Alejandro Perez KGY:[id number] DOB: [birth year] DOA: 10/10/2020  PCP: Pcp, No (Confirm with patient/family/NH records and if not entered, this has to be entered at [facility] point of entry) Patient coming from: Home  I have personally briefly reviewed patient's old medical records in [facility]  Chief Complaint: Chest pain  HPI: Alejandro Perez is a 44 y.o. male with medical history significant of recent stroke left MCA and subacute right corona radiata and other chronic lacunar infarct, HTN, LVH, remote history of TTP, status post splenectomy, CKD stage II. It appeared that on last mission, the patient had persistent with dizziness and falling to his left, and CT head showed acute infarct of the left MCA territory and a subacute right corona radiata with some other chronic lacunar infarct.  On same admission, patient was complaining about chest pain, CT angiogram was done which came back negative for dissections.  Patient UDS was positive for cocaine.  Patient was discharged home and remained stable until 3 days ago, he started to experience similar chest pain as last time centrally located persistent sharp-like, no exacerbation or relieving factors.  Associated with palpitations.  And again he started to experience global headache, denied any nauseous vomit, no vision changes, he also reported worsening of the left arm weakness.  Denied any leg weakness no speech problems no vision changes.  And he also denied any recent cocaine use. ED Course: Given the patient complaining about headache, CT head was done, which showed new hypoattenuation along the ventral body of the left corpus callosum eccentric to the left suspicious for acute subacute infarct.  Neurologyand MRI ordered.  UDS ordered to rule out recent cocaine use.  Review of Systems: As per HPI otherwise 14 point review of systems negative.    Past Medical History:  Diagnosis Date  . Anemia   . Chest pain   .  Glaucoma   . Hypertension   . LVH (left ventricular hypertrophy)   . Spleen enlarged   . Stroke (HCC)   . Thrombocytopenia (HCC)     Past Surgical History:  Procedure Laterality Date  . BUBBLE STUDY  09/28/2020   Procedure: BUBBLE STUDY;  Surgeon: [name], MD;  Location: [facility] ENDOSCOPY;  Service: Cardiovascular;;  . CHOLECYSTECTOMY    . SPLENECTOMY, 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   Procedure: SPLENECTOMY;  Surgeon: [name], MD;  Location: [facility] OR;  Service: General;  Laterality: N/A;  . TEE WITHOUT CARDIOVERSION N/A 09/28/2020   Procedure: TRANSESOPHAGEAL ECHOCARDIOGRAM (TEE);  Surgeon: [name], MD;  Location: [facility] ENDOSCOPY;  Service: Cardiovascular;  Laterality: N/A;     reports that he quit smoking about 3 weeks ago. He has never used smokeless tobacco. He reports that he does not drink alcohol and does not use drugs.  Allergies  Allergen Reactions  . Morphine And Related Other (See Comments)    Severe abdominal pain  . Fentanyl Other (See Comments)    Mania    Family History  Problem Relation Age of Onset  . Sickle cell trait Mother      Prior to Admission medications   Medication Sig Start Date End Date Taking? Authorizing Provider  acetaminophen (TYLENOL) 325 MG tablet Take 650 mg by mouth every 6 (six) hours as needed for moderate pain or headache.   Yes [provider]  amLODipine (NORVASC) 10 MG tablet Take 1 tablet (10 mg total) by mouth daily. 09/28/20  Yes Elgergawy, Leana Roe, MD  aspirin EC 81  MG EC tablet Take 1 tablet (81 mg total) by mouth daily. Swallow whole. 09/29/20  Yes Elgergawy, Leana Roe, MD  atorvastatin (LIPITOR) 40 MG tablet Take 1 tablet (40 mg total) by mouth daily. 09/28/20  Yes Elgergawy, Leana Roe, MD  cloNIDine (CATAPRES) 0.2 MG tablet Take 1 tablet (0.2 mg total) by mouth 2 (two) times daily. 09/28/20  Yes Elgergawy, Leana Roe, MD  clopidogrel (PLAVIX) 75 MG tablet Take 1 tablet (75 mg total) by mouth daily. Take for 18 days then  stop 09/28/20  Yes Elgergawy, Leana Roe, MD  folic acid (FOLVITE) 1 MG tablet Take 1 tablet (1 mg total) by mouth daily. 12/25/17  Yes Rhetta Mura, MD  furosemide (LASIX) 40 MG tablet Take 1 tablet (40 mg total) by mouth daily. 09/28/20  Yes Elgergawy, Leana Roe, MD  hydrochlorothiazide (MICROZIDE) 12.5 MG capsule Take 1 capsule (12.5 mg total) by mouth daily. 09/28/20  Yes Elgergawy, Leana Roe, MD  metoprolol tartrate (LOPRESSOR) 25 MG tablet Take 0.5 tablets (12.5 mg total) by mouth 2 (two) times daily. 09/28/20  Yes Elgergawy, Leana Roe, MD  Multiple Vitamin (MULTIVITAMIN WITH MINERALS) TABS tablet Take 1 tablet by mouth daily. 10/19/19  Yes Mikhail, Maryann, DO  pantoprazole (PROTONIX) 40 MG tablet Take 1 tablet (40 mg total) by mouth daily. 09/29/20  Yes Elgergawy, Leana Roe, MD  vitamin B-12 1000 MCG tablet Take 1 tablet (1,000 mcg total) by mouth daily. 12/25/17  Yes Rhetta Mura, MD  oxyCODONE (OXY IR/ROXICODONE) 5 MG immediate release tablet Take 2 tablets (10 mg total) by mouth every 6 (six) hours as needed for moderate pain or severe pain. Patient not taking: Reported on 10/10/2020 10/17/19   [name], MD  polyethylene glycol (MIRALAX / GLYCOLAX) 17 g packet Take 17 g by mouth daily. Patient not taking: Reported on 10/10/2020 10/17/19   [name], MD  senna-docusate (SENOKOT-S) 8.6-50 MG tablet Take 2 tablets by mouth at bedtime as needed for mild constipation or moderate constipation. Patient not taking: Reported on 10/10/2020 10/17/19   [name], MD    Physical Exam: Vitals:   10/10/20 1715 10/10/20 1730 10/10/20 1745 10/10/20 1800  BP:  (!) 143/81  (!) 149/73  Pulse: (!) 53 62 63 (!) 54  Resp: 15 19 (!) 21 18  Temp:      TempSrc:      SpO2: 99% 97% 100% 96%  Weight:      Height:        Constitutional: NAD, calm, comfortable Vitals:   10/10/20 1715 10/10/20 1730 10/10/20 1745 10/10/20 1800  BP:  (!) 143/81  (!) 149/73  Pulse: (!) 53 62 63 (!) 54  Resp:  15 19 (!) 21 18  Temp:      TempSrc:      SpO2: 99% 97% 100% 96%  Weight:      Height:       Eyes: PERRL, lids and conjunctivae normal ENMT: Mucous membranes are moist. Posterior pharynx clear of any exudate or lesions.Normal dentition.  Neck: normal, supple, no masses, no thyromegaly Respiratory: clear to auscultation bilaterally, no wheezing, no crackles. Normal respiratory effort. No accessory muscle use.  Cardiovascular: Regular rate and rhythm, no murmurs / rubs / gallops. No extremity edema. 2+ pedal pulses. No carotid bruits.  Abdomen: no tenderness, no masses palpated. No hepatosplenomegaly. Bowel sounds positive.  Musculoskeletal: no clubbing / cyanosis. No joint deformity upper and lower extremities. Good ROM, no contractures. Normal muscle tone.  Skin: no rashes, lesions, ulcers. No  induration Neurologic: CN 2-12 grossly intact. Sensation intact, DTR normal.  Left arm strength 4/5 compared to 5/5 on the right side, bilateral leg strength 5/5 Psychiatric: Normal judgment and insight. Alert and oriented x 3. Normal mood.    Labs on Admission: I have personally reviewed following labs and imaging studies  CBC: Recent Labs  Lab 10/10/20 1328  WBC 9.2  HGB 16.5  HCT 45.7  MCV 85.6  PLT 491*   Basic Metabolic Panel: Recent Labs  Lab 10/10/20 1328  NA 136  K 3.3*  CL 89*  CO2 32  GLUCOSE 90  BUN 13  CREATININE 1.87*  CALCIUM 10.1   GFR: Estimated Creatinine Clearance: 51.3 mL/min (A) (by C-G formula based on SCr of 1.87 mg/dL (H)). Liver Function Tests: Recent Labs  Lab 10/10/20 1328  AST 36  ALT 65*  ALKPHOS 120  BILITOT 1.0  PROT 6.9  ALBUMIN 4.5   Recent Labs  Lab 10/10/20 1328  LIPASE 31   No results for input(s): AMMONIA in the last 168 hours. Coagulation Profile: No results for input(s): INR, PROTIME in the last 168 hours. Cardiac Enzymes: No results for input(s): CKTOTAL, CKMB, CKMBINDEX, TROPONINI in the last 168 hours. BNP (last 3  results) No results for input(s): PROBNP in the last 8760 hours. HbA1C: No results for input(s): HGBA1C in the last 72 hours. CBG: No results for input(s): GLUCAP in the last 168 hours. Lipid Profile: No results for input(s): CHOL, HDL, LDLCALC, TRIG, CHOLHDL, LDLDIRECT in the last 72 hours. Thyroid Function Tests: No results for input(s): TSH, T4TOTAL, FREET4, T3FREE, THYROIDAB in the last 72 hours. Anemia Panel: No results for input(s): VITAMINB12, FOLATE, FERRITIN, TIBC, IRON, RETICCTPCT in the last 72 hours. Urine analysis:    Component Value Date/Time   COLORURINE YELLOW 10/08/2019 0456   APPEARANCEUR CLEAR 10/08/2019 0456   LABSPEC 1.013 10/08/2019 0456   PHURINE 6.0 10/08/2019 0456   GLUCOSEU NEGATIVE 10/08/2019 0456   HGBUR NEGATIVE 10/08/2019 0456   BILIRUBINUR NEGATIVE 10/08/2019 0456   KETONESUR NEGATIVE 10/08/2019 0456   PROTEINUR NEGATIVE 10/08/2019 0456   NITRITE NEGATIVE 10/08/2019 0456   LEUKOCYTESUR NEGATIVE 10/08/2019 0456    Radiological Exams on Admission: DG Chest 2 View  Result Date: 10/10/2020 CLINICAL DATA:  Chest pain and headache 4 days. EXAM: CHEST - 2 VIEW COMPARISON:  09/25/2020 and CT 09/23/2020 FINDINGS: Lungs are adequately inflated without focal airspace consolidation or effusion. Calcified granuloma over the right middle lobe. Cardiomediastinal silhouette and remainder of the exam is unchanged. IMPRESSION: No active cardiopulmonary disease. Electronically Signed   By: Elberta Fortisaniel  Boyle M.D.   On: 10/10/2020 14:30   CT Head Wo Contrast  Result Date: 10/10/2020 CLINICAL DATA:  Headache, recent stroke EXAM: CT HEAD WITHOUT CONTRAST TECHNIQUE: Contiguous axial images were obtained from the base of the skull through the vertex without intravenous contrast. COMPARISON:  09/25/2020 FINDINGS: Brain: New hypoattenuation along the ventral body of the corpus callosum eccentric to the left. Evolving recent infarcts of the posterior left MCA territory and right  corona radiata. Chronic infarcts of the left corona radiata and right thalamus. No acute intracranial hemorrhage or significant mass effect. No hydrocephalus or extra-axial collection. Vascular: No hyperdense vessel. There is atherosclerotic calcification at the skull base. Skull: Calvarium is unremarkable. Sinuses/Orbits: No acute finding. Other: None. IMPRESSION: New hypoattenuation along the ventral body of the corpus callosum eccentric to the left suspicious for acute or subacute infarction. Evolving recent infarcts of the posterior left MCA territory and right  corona radiata. No acute intracranial hemorrhage. These results were called by telephone at the time of interpretation on 10/10/2020 at 4:47 pm to provider Trudee Grip, who verbally acknowledged these results. Electronically Signed   By: Guadlupe Spanish M.D.   On: 10/10/2020 16:47    EKG: Independently reviewed. Similar T wave inversion on II III aVF and V5-6 compared to earlier this month.  Assessment/Plan Active Problems:   Chest pain  (please populate well all problems here in Problem List. (For example, if patient is on BP meds at home and you resume or decide to hold them, it is a problem that needs to be her. Same for CAD, COPD, HLD and so on)  Acute left CVA -No electrolyte about the etiology, might be just a evolving stroke from last time. -Neurology on board, MRI ordered -Continue risk factor modification, continue aspirin Plavix, statin.  Will modify his BP meds regimen as below.  Chest pain -2 sets troponin negative EKG no change as compared to last time, ACS ruled out. 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  UDS ordered. -We will treat as cocaine induced angina, Ativan 3 times daily x2 days.  Change metoprolol to Coreg.  Uncontrolled hypertension -Along with borderline bradycardia, will stop clonidine -Change metoprolol to Coreg as above, add as needed  hydralazine. -Discontinue HCTZ given his CKD conditions, continue Lasix  Bradycardia -As above  CKD stage II -Appears to be euvolemic.  DVT prophylaxis: Heparin subcu Code Status: Full code Family Communication: Wife at bedside Disposition Plan: Expect more than 2 midnight hospital stay for new stroke work-up pending probably cocaine related anginal treatment. Consults called: Neurology Admission status: Telemetry admission   Emeline General MD [facility] Pager [phone]  10/10/2020, 6:15 PM

## 2020-10-10 NOTE — Consult Note (Addendum)
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 Tamera Punt  CC: Headache, blurry vision   History is obtained from:chart, pt difficult to understand given recent stroke  HPI: Alejandro Perez is a 44 y.o. 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 CT head done in ED today shows an acute-subacute stroke at body of corpus callosum, which was not present on recent prior imaging; also seen were the known recent left MCA and right corona radiata strokes.   Stroke neurology was called regarding the above.     LKW: 3 days tPA given?: No. Out of time window.   ROS: A 14 point ROS was performed and is negative except as noted in the HPI.   Past Medical History:  Diagnosis Date  . Anemia   . Chest pain   . Glaucoma   . Hypertension   . LVH (left ventricular hypertrophy)   . Spleen enlarged   . Stroke ([place])   . Thrombocytopenia ([place])       Family History  Problem Relation Age of Onset  . Sickle cell trait Mother       Social History:  reports that he quit smoking about 3 weeks ago. He has never used smokeless tobacco. He reports that he does not drink alcohol and does not use drugs. Toxicology screen shows positive cocaine, opiates, and THC.    Home Medications: No current facility-administered medications on file prior to encounter.   Current Outpatient Medications on File Prior to Encounter  Medication Sig Dispense Refill  . acetaminophen (TYLENOL) 325 MG tablet Take 650 mg by mouth every 6 (six) hours as needed for moderate pain or headache.    Marland Kitchen amLODipine (NORVASC) 10 MG tablet Take 1 tablet (10 mg total) by  mouth daily. 30 tablet 0  . aspirin EC 81 MG EC tablet Take 1 tablet (81 mg total) by mouth daily. Swallow whole. 30 tablet 1  . atorvastatin (LIPITOR) 40 MG tablet Take 1 tablet (40 mg total) by mouth daily. 30 tablet 0  . cloNIDine (CATAPRES) 0.2 MG tablet Take 1 tablet (0.2 mg total) by mouth 2 (two) times daily. 60 tablet 0  . clopidogrel (PLAVIX) 75 MG tablet Take 1 tablet (75 mg total) by mouth daily. Take for 18 days then stop 18 tablet 0  . folic acid (FOLVITE) 1 MG tablet Take 1 tablet (1 mg total) by mouth daily. 30 tablet 0  . furosemide (LASIX) 40 MG tablet Take 1 tablet (40 mg total) by mouth daily. 30 tablet 0  . hydrochlorothiazide (MICROZIDE) 12.5 MG capsule Take 1 capsule (12.5 mg total) by mouth daily. 30 capsule 0  . metoprolol tartrate (LOPRESSOR) 25 MG tablet Take 0.5 tablets (12.5 mg total) by mouth 2 (two) times daily. 30 tablet 0  . Multiple Vitamin (MULTIVITAMIN WITH MINERALS) TABS tablet Take 1 tablet by mouth daily.    . pantoprazole (PROTONIX) 40 MG tablet Take 1 tablet (40 mg total) by mouth daily. 30 tablet 0  . vitamin B-12 1000 MCG tablet Take 1 tablet (1,000 mcg total) by mouth daily. 30 tablet 0  . oxyCODONE (OXY IR/ROXICODONE) 5 MG immediate release tablet Take 2 tablets (10 mg total)  by mouth every 6 (six) hours as needed for moderate pain or severe pain. (Patient not taking: Reported on 10/10/2020) 56 tablet 0  . polyethylene glycol (MIRALAX / GLYCOLAX) 17 g packet Take 17 g by mouth daily. (Patient not taking: Reported on 10/10/2020) 30 each 0  . senna-docusate (SENOKOT-S) 8.6-50 MG tablet Take 2 tablets by mouth at bedtime as needed for mild constipation or moderate constipation. (Patient not taking: Reported on 10/10/2020) 30 tablet 0    Exam: Current vital signs: BP (!) 146/121   Pulse (!) 55   Temp 97.7 F (36.5 C) (Oral)   Resp 14   Ht $R'6\' 2"'Ez$  (1.88 m)   Wt 73.5 kg   SpO2 100%   BMI 20.80 kg/m  Vital signs in last 24 hours: Temp:  [97.7 F (36.5  C)-98.3 F (36.8 C)] 97.7 F (36.5 C) (11/21 1648) Pulse Rate:  [50-62] 55 (11/21 1700) Resp:  [10-30] 14 (11/21 1700) BP: (128-153)/(81-121) 146/121 (11/21 1700) SpO2:  [91 %-100 %] 100 % (11/21 1700) Weight:  [73.5 kg] 73.5 kg (11/21 1317)   Physical Exam  Constitutional: Appears well-developed and well-nourished.  Psych: Affect appropriate to situation Eyes: No scleral injection HENT: No OP obstrucion MSK: no joint deformities.  Cardiovascular: Normal rate and regular rhythm.  Respiratory: Effort normal, non-labored breathing GI: Soft.  No distension. There is no tenderness.  Skin: WDI  Neuro: Mental Status: Patient is awake but drowsy, with slowed mentation. Increased latencies of verbal and motor responses. Dysarthric. Speech is difficult to comprehend. Patient is unable to give a clear and coherent history; he has significant expressive aphasia from recent infarction. Follows some commands but has difficulty with others.   Cranial Nerves: II: Will fixate on examiner's face. Difficult to assess visual fields. Pupils are equal, round, and reactive to light.    III,IV, VI: Mild esotropia. Slow EOMI. No nystagmus.   V: Difficult to assess given slowed mentation.  VII: Right facial droop.  VIII: hearing is intact to voice XI: Head is preferentially rotated slightly to the left. XII: Moderate lingual dysarthria  Motor: Bulk is normal. Slow movement x 4. Decreased strength to RUE in the context of patient not following commands in detail. Will resist briefly with BLE with 4+/5 strength. Sensory: Grossly intact. Unable to assess for extinction in the context of slowed mentation.   Deep Tendon Reflexes: 2+ and symmetric in the biceps and patellae.   Plantars: Toes are downgoing bilaterally.   Cerebellar: Slow movements.  No gross ataxia.   I have reviewed labs in epic and the results pertinent to this consultation are:  Results for CONNER, MUEGGE (MRN [id number]) as of  10/10/2020 17:57  Ref. Range 10/10/2020 13:28  Creatinine Latest Ref Range: 0.61 - 1.24 mg/dL 1.87 (H)  Results for ILDEFONSO, KEANEY (MRN [id number]) as of 10/10/2020 17:57  Ref. Range 09/24/2020 10:30  Amphetamines Latest Ref Range: NONE DETECTED  NONE DETECTED  Barbiturates Latest Ref Range: NONE DETECTED  NONE DETECTED  Benzodiazepines Latest Ref Range: NONE DETECTED  NONE DETECTED  Opiates Latest Ref Range: NONE DETECTED  POSITIVE (A)  COCAINE Latest Ref Range: NONE DETECTED  POSITIVE (A)  Tetrahydrocannabinol Latest Ref Range: NONE DETECTED  POSITIVE (A)    I have reviewed the images obtained:   CT head 10/10/20 -New hypoattenuation along the ventral body of the corpus callosum eccentric to the left suspicious for acute or subacute infarction. -Evolving recent infarcts of the posterior left MCA territory and right corona radiata. -No acute  intracranial hemorrhage.  Assessment: 44 year old right handed male with a history of hypertension, polysubstance use and recent (09/23/2020) left MCA territory stroke with significant expressive aphasia, who presents with blurred vision, persistent aphasia and abdominal pain.  - CT head shows new infarction at the corpus callosum. Large recent left MCA ischemic infarction shows expected interval changes relative to the prior imaging study.  - Exam reveals speech and motor deficits referable to the left MCA stroke diagnosed previously. His corpus callosum infarct is extensive and may be contributing to his slowed mentation.  - TCD 09/24/20: No HITS at rest or during Valsalva. Negative transcranial Doppler Bubble study with no evidence of right to left intracardiac communication. The right middle cerebral artery was studied.   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 A small or medium-vessel vasculitis should be considered. The corpus callosum infarct is unusually large; the atypical size increases the likelihood of intrinsic vessel pathology (e.g. inflammatory) as the etiology. RCVS is also a consideration given that he is cocaine-positive.   Recommendations: 1)  Hgba1c, fasting lipid panel 2)  MRI brain without contrast. MRA of head and neck.  3) PT/OT/ speech consult. 4) Do not need to repeat echocardiograms as TTE and TEE were obtained recently 5)  May need a catheter-based angiogram on Monday to assess for possible medium-vessel vasculitis versus RCVS 6) Continue dual antiplatelet therapy with Plavix 75 mg and aspirin $RemoveBef'81mg'wscKWVtWPe$  daily. 7) Risk factor modification: cigarette use, substance use, diet, exercise 8) Telemetry monitoring 9) Frequent neuro chrecks 10) IVF 11) Lower blood pressure by 15% daily to goal blood pressure <130/80 12) Statin therapy 13) Stroke team to follow in the AM  14) May need to have a vasculitis panel sent to [facility] 15) ESR and C-reactive protein 16) Stroke Team to consider starting verapamil for possible RCVS  Addendum: Small subdural seen on MRI brain, in addition to acute left corpus callosum stroke. ASA and Plavix have been discontinued.   I have seen and examined the patient. I have formulated the assessment and recommendations. 44 year old right handed male with a history of hypertension, polysubstance use and recent (09/23/2020) left MCA territory stroke with significant expressive aphasia, who presents with blurred vision, persistent aphasia and abdominal pain. CT head shows new infarction at the corpus callosum. Large recent left MCA ischemic infarction shows expected interval changes relative to the prior imaging study. Recommendations as above.   Electronically  signed: Dr. Kerney Elbe

## 2020-10-10 NOTE — ED Provider Notes (Signed)
Care received from [facility].  Please review her note for full HPI  In short, 44 year old male who is admitted to the hospital with a stroke several weeks ago, presented with complaints of left-sided chest pain, shortness of breath, headache and pain in the upper abdomen.  He had arrived to the ER with several weeks ago with similar complaints, and had extensive imaging done including a CTA of the chest abdomen and pelvis which were negative.  He was diagnosed with a large left MCA stroke and admitted.  Discharged on 11/9.  On presentation, he denied any new neurologic symptoms to the prior provider.  There were no noticeable deficits on exam other than his speech was difficult to understand which he had stated his baseline from the stroke.  His chest pain and abdominal pain is also been consistent since his stroke.  Work-up included basic labs which showed mild hyperkalemia, elevated creatinine of 1.87 however patient does have what appears to be baseline mild renal disease.  His CBC was without leukocytosis.  He did have a mildly elevated ALT which is likely nonspecific.  His delta troponins are negative.  X-ray is without acute abnormalities.  Prior provider ordered a CT of the head, which showed a new left MCA stroke as compared to the prior study.  On reexamination, patient does endorse worsening expressive aphasia over the last 4 days.  Neurology was consulted, saw and evaluated the patient.  They would like for the patient to be admitted, requesting an MRI of the brain without contrast which I ordered.  Requested admission to the hospitalist.  Spoke with Dr. Chipper Herb who will admit the patient for further evaluation and treatment.  Covid test pending.  Hemodynamically stable here in the ED right now.  Low suspicion for ACS, dissection, acute abdomen given unchanged symptoms from prior presentation.  Pain managed with Tylenol and oxycodone. Physical Exam  BP (!) 149/73   Pulse (!) 54   Temp 97.7 F (36.5  C) (Oral)   Resp 18   Ht 6\' 2"  (1.88 m)   Wt 73.5 kg   SpO2 96%   BMI 20.80 kg/m   Physical Exam Vitals and nursing note reviewed.  Constitutional:      General: He is not in acute distress.    Appearance: He is well-developed. He is not toxic-appearing or diaphoretic.  HENT:     Head: Normocephalic and atraumatic.  Eyes:     Conjunctiva/sclera: Conjunctivae normal.  Cardiovascular:     Rate and Rhythm: Normal rate and regular rhythm.     Pulses: Normal pulses.     Heart sounds: No murmur heard.   Pulmonary:     Effort: Pulmonary effort is normal. No respiratory distress.     Breath sounds: Normal breath sounds.  Chest:       Comments: Reproducible chest wall tenderness on the left and upper abdomen. No notable masses, pulses to the abdomen noted  Abdominal:     General: Abdomen is flat.     Palpations: Abdomen is soft.     Tenderness: There is no abdominal tenderness.  Musculoskeletal:     Cervical back: Neck supple.  Skin:    General: Skin is warm and dry.     Capillary Refill: Capillary refill takes less than 2 seconds.     Findings: No erythema.  Neurological:     General: No focal deficit present.     Mental Status: He is alert and oriented to person, place, and time.  Comments:   Mental Status:  Alert, thought content appropriate, able to give a coherent history. Speech difficult to understand with notable expressive aphasia. Able to follow 2 step commands without difficulty.  Cranial Nerves:  II: Peripheral visual fields grossly normal, pupils equal, round, reactive to light III,IV, VI: ptosis not present, extra-ocular motions intact bilaterally  V,VII: smile symmetric, facial light touch sensation equal VIII: hearing grossly normal to voice  X: uvula elevates symmetrically  XI: bilateral shoulder shrug symmetric and strong XII: midline tongue extension without fassiculations Motor:  Normal tone. 5/5 strength of BUE and BLE major muscle groups  including strong and equal grip strength and dorsiflexion/plantar flexion Sensory: light touch normal in all extremities. Cerebellar: normal finger-to-nose with bilateral upper extremities, Romberg sign absent Gait: not accessed      ED Course/Procedures     Procedures  MDM         Mare Ferrari, PA-C 10/10/20 1821    Rolan Bucco, MD 10/10/20 2032

## 2020-10-10 NOTE — ED Notes (Signed)
Pt provided urinal at this time.

## 2020-10-10 NOTE — ED Notes (Signed)
Patient transported to CT 

## 2020-10-10 NOTE — ED Notes (Signed)
Unable to validate vitals at this time, pt is in CT.

## 2020-10-10 NOTE — ED Notes (Signed)
Pts HR noted to drop down sinus brady HR of 40, admitting provider notified.

## 2020-10-10 NOTE — ED Notes (Signed)
Pt resting in bed. NADN. Family at bedside 

## 2020-10-10 NOTE — Plan of Care (Signed)
Called by neurorads In addition to multiple strokes, he has a subacute subdural hematoma.  ASA order discontinued. Rest of the recs as in Dr. Shelbie Hutching consult  -- Milon Dikes, MD

## 2020-10-10 NOTE — ED Triage Notes (Signed)
Pt reports central chest pain x 4 days. Also endorses headache, shortness of breath, n/v, and dizziness. Sts pain is worse with exertion.

## 2020-10-10 NOTE — ED Provider Notes (Signed)
[facility] EMERGENCY DEPARTMENT Provider Note   CSN: [id number] Arrival date & time: 10/10/20  1309     History Chief Complaint  Patient presents with  . Chest Pain  . Headache    Jaleal Schliep is a 44 y.o. male with a past medical history of anemia, hypertension, prior stroke with recent admission, discharged 2 weeks ago presenting to the ED with multiple complaints. For the past 3 days has been having constant left-sided chest pain with associated shortness of breath.  Pain is worse with movement and palpation.  Reports a dry cough.  States the pain will radiate to his upper abdomen as well with associated nausea.  No vomiting or changes to bowel movements.  No fever or sick contacts with similar symptoms. Also reports headache.  He has had a headache since he was diagnosed with a stroke requiring admission recently.  He tried taking medication for the pain but does not remember the name of the medication.  Reports ongoing blurry vision since his stroke as well.  This is not worsened.  No numbness in arms or legs.  Reports he has had difficulty speaking since the stroke but has not followed up with speech therapy.  Reports compliance with his home medications.  Denies any neck stiffness, injuries or falls.  HPI     Past Medical History:  Diagnosis Date  . Anemia   . Chest pain   . Glaucoma   . Hypertension   . LVH (left ventricular hypertrophy)   . Spleen enlarged   . Stroke (HCC)   . Thrombocytopenia [facility])     Patient Active Problem List   Diagnosis Date Noted  . CKD (chronic kidney disease) 09/24/2020  . (HFpEF) heart failure with preserved ejection fraction (HCC) 09/24/2020  . CVA (cerebral vascular accident) (HCC) 09/23/2020  . SOB (shortness of breath)   . 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 Nausea 10/11/2019  . Splenomegaly 10/08/2019  . Vitamin B12 deficiency   . Pulmonary HTN (HCC)   . Chest pain 12/21/2017  .  Thrombocytopenia (HCC) 12/21/2017  . Spleen enlarged 12/21/2017  . Schistocytes on peripheral blood smear 12/21/2017  . Hypertension   . Anemia   . Acquired TTP   . Abdominal pain, RUQ     Past Surgical History:  Procedure Laterality Date  . BUBBLE STUDY  09/28/2020   Procedure: BUBBLE STUDY;  Surgeon: [name], MD;  Location: [facility] ENDOSCOPY;  Service: Cardiovascular;;  . CHOLECYSTECTOMY    . SPLENECTOMY, 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   Procedure: SPLENECTOMY;  Surgeon: [name], MD;  Location: [facility] OR;  Service: General;  Laterality: N/A;  . TEE WITHOUT CARDIOVERSION N/A 09/28/2020   Procedure: TRANSESOPHAGEAL ECHOCARDIOGRAM (TEE);  Surgeon: [name], MD;  Location: [facility] ENDOSCOPY;  Service: Cardiovascular;  Laterality: N/A;       Family History  Problem Relation Age of Onset  . Sickle cell trait Mother     Social History   Tobacco Use  . Smoking status: Former Smoker    Quit date: 09/19/2020    Years since quitting: 0.0  . Smokeless tobacco: Never Used  Vaping Use  . Vaping Use: Never used  Substance Use Topics  . Alcohol use: No  . Drug use: No    Home Medications Prior to Admission medications   Medication Sig Start Date End Date Taking? Authorizing Provider  acetaminophen (TYLENOL) 325 MG tablet Take 650 mg by mouth every 6 (six) hours as needed for  moderate pain or headache.   Yes [provider]  amLODipine (NORVASC) 10 MG tablet Take 1 tablet (10 mg total) by mouth daily. 09/28/20  Yes Elgergawy, Leana Roe, MD  aspirin EC 81 MG EC tablet Take 1 tablet (81 mg total) by mouth daily. Swallow whole. 09/29/20  Yes Elgergawy, Leana Roe, MD  atorvastatin (LIPITOR) 40 MG tablet Take 1 tablet (40 mg total) by mouth daily. 09/28/20  Yes Elgergawy, Leana Roe, MD  cloNIDine (CATAPRES) 0.2 MG tablet Take 1 tablet (0.2 mg total) by mouth 2 (two) times daily. 09/28/20  Yes Elgergawy, Leana Roe, MD  clopidogrel (PLAVIX) 75 MG tablet Take 1 tablet (75 mg total) by  mouth daily. Take for 18 days then stop 09/28/20  Yes Elgergawy, Leana Roe, MD  folic acid (FOLVITE) 1 MG tablet Take 1 tablet (1 mg total) by mouth daily. 12/25/17  Yes Rhetta Mura, MD  furosemide (LASIX) 40 MG tablet Take 1 tablet (40 mg total) by mouth daily. 09/28/20  Yes Elgergawy, Leana Roe, MD  hydrochlorothiazide (MICROZIDE) 12.5 MG capsule Take 1 capsule (12.5 mg total) by mouth daily. 09/28/20  Yes Elgergawy, Leana Roe, MD  metoprolol tartrate (LOPRESSOR) 25 MG tablet Take 0.5 tablets (12.5 mg total) by mouth 2 (two) times daily. 09/28/20  Yes Elgergawy, Leana Roe, MD  Multiple Vitamin (MULTIVITAMIN WITH MINERALS) TABS tablet Take 1 tablet by mouth daily. 10/19/19  Yes Mikhail, Maryann, DO  pantoprazole (PROTONIX) 40 MG tablet Take 1 tablet (40 mg total) by mouth daily. 09/29/20  Yes Elgergawy, Leana Roe, MD  vitamin B-12 1000 MCG tablet Take 1 tablet (1,000 mcg total) by mouth daily. 12/25/17  Yes Rhetta Mura, MD  oxyCODONE (OXY IR/ROXICODONE) 5 MG immediate release tablet Take 2 tablets (10 mg total) by mouth every 6 (six) hours as needed for moderate pain or severe pain. Patient not taking: Reported on 10/10/2020 10/17/19   [name], MD  polyethylene glycol (MIRALAX / GLYCOLAX) 17 g packet Take 17 g by mouth daily. Patient not taking: Reported on 10/10/2020 10/17/19   [name], MD  senna-docusate (SENOKOT-S) 8.6-50 MG tablet Take 2 tablets by mouth at bedtime as needed for mild constipation or moderate constipation. Patient not taking: Reported on 10/10/2020 10/17/19   [name], MD    Allergies    Morphine and related  Review of Systems   Review of Systems  Constitutional: Negative for appetite change, chills and fever.  HENT: Negative for ear pain, rhinorrhea, sneezing and sore throat.   Eyes: Negative for photophobia and visual disturbance.  Respiratory: Positive for cough and shortness of breath. Negative for chest tightness and wheezing.   Cardiovascular:  Positive for chest pain. Negative for palpitations.  Gastrointestinal: Negative for abdominal pain, blood in stool, constipation, diarrhea, nausea and vomiting.  Genitourinary: Negative for dysuria, hematuria and urgency.  Musculoskeletal: Negative for myalgias.  Skin: Negative for rash.  Neurological: Positive for headaches. Negative for dizziness, weakness and light-headedness.    Physical Exam Updated Vital Signs BP (!) 153/89   Pulse (!) 53   Temp 98.3 F (36.8 C) (Oral)   Resp 18   Ht 6\' 2"  (1.88 m)   Wt 73.5 kg   SpO2 98%   BMI 20.80 kg/m   Physical Exam Vitals and nursing note reviewed.  Constitutional:      General: He is not in acute distress.    Appearance: He is well-developed.  HENT:     Head: Normocephalic and atraumatic.     Nose:  Nose normal.  Eyes:     General: No scleral icterus.       Right eye: No discharge.        Left eye: No discharge.     Conjunctiva/sclera: Conjunctivae normal.     Pupils: Pupils are equal, round, and reactive to light.  Cardiovascular:     Rate and Rhythm: Normal rate and regular rhythm.     Heart sounds: Normal heart sounds. No murmur heard.  No friction rub. No gallop.   Pulmonary:     Effort: Pulmonary effort is normal. No respiratory distress.     Breath sounds: Normal breath sounds.  Chest:     Chest wall: Tenderness present.    Abdominal:     General: Bowel sounds are normal. There is no distension.     Palpations: Abdomen is soft.     Tenderness: There is no abdominal tenderness. There is no guarding.  Musculoskeletal:        General: Normal range of motion.     Cervical back: Normal range of motion and neck supple.     Right lower leg: No edema.     Left lower leg: No edema.  Skin:    General: Skin is warm and dry.     Findings: No rash.  Neurological:     Mental Status: He is alert and oriented to person, place, and time.     Cranial Nerves: No cranial nerve deficit.     Motor: No weakness or abnormal  muscle tone.     Coordination: Coordination normal.     Comments: Difficult to understand speech.     ED Results / Procedures / Treatments   Labs (all labs ordered are listed, but only abnormal results are displayed) Labs Reviewed  BASIC METABOLIC PANEL - Abnormal; Notable for the following components:      Result Value   Potassium 3.3 (*)    Chloride 89 (*)    Creatinine, Ser 1.87 (*)    GFR, Estimated 44 (*)    All other components within normal limits  CBC - Abnormal; Notable for the following components:   MCHC 36.1 (*)    Platelets 491 (*)    All other components within normal limits  HEPATIC FUNCTION PANEL - Abnormal; Notable for the following components:   ALT 65 (*)    All other components within normal limits  LIPASE, BLOOD  TROPONIN I (HIGH SENSITIVITY)  TROPONIN I (HIGH SENSITIVITY)    EKG EKG Interpretation  Date/Time:  Sunday October 10 2020 13:12:55 EST Ventricular Rate:  54 PR Interval:  182 QRS Duration: 86 QT Interval:  480 QTC Calculation: 455 R Axis:   80 Text Interpretation: Sinus bradycardia Biatrial enlargement Left ventricular hypertrophy with repolarization abnormality ( Sokolow-Lyon , Cornell product ) Cannot rule out Septal infarct , age undetermined Abnormal ECG similar to prior EKG's Confirmed by Eber Hong ([id number]) on 10/10/2020 1:36:36 PM   Radiology DG Chest 2 View  Result Date: 10/10/2020 CLINICAL DATA:  Chest pain and headache 4 days. EXAM: CHEST - 2 VIEW COMPARISON:  09/25/2020 and CT 09/23/2020 FINDINGS: Lungs are adequately inflated without focal airspace consolidation or effusion. Calcified granuloma over the right middle lobe. Cardiomediastinal silhouette and remainder of the exam is unchanged. IMPRESSION: No active cardiopulmonary disease. Electronically Signed   By: Elberta Fortis M.D.   On: 10/10/2020 14:30    Procedures Procedures (including critical care time)  Medications Ordered in ED Medications  acetaminophen  (TYLENOL) tablet 650 mg (  has no administration in time range)    ED Course  I have reviewed the triage vital signs and the nursing notes.  Pertinent labs & imaging results that were available during my care of the patient were reviewed by me and considered in my medical decision making (see chart for details).    MDM Rules/Calculators/A&P                          44 year old male with past medical history of anemia, hypertension, prior stroke with recent admission presenting to the ED with multiple complaints. 1.  Constant left-sided chest pain with shortness of breath for the past 3 days.  Pain is worse with movement and palpation.  Reports dry cough as well.  No fever or sick contacts with similar symptoms.  On exam chest and upper abdomen are tender to palpation.  No lower extremity edema, erythema or calf tenderness of concern me for DVT.  He has been compliant with his medications.  He has not tachycardic, tachypneic or hypoxic.  EKG shows sinus bradycardia, similar to prior EKGs.  Chest x-ray without any acute findings.  Initial troponin is 17, which is lower than his value 2 weeks ago.  Will need delta troponin check. 2.  Headache.  He reports ongoing headache and blurry vision since being discharged from the hospital after his stroke.  He has been taking pain medication but did not remember the name of it.  Denies any new neurological deficits.  No weakness or numbness noted on exam.  He does have difficult to understand speech which she states has been present since the stroke but has not changed.  He has not followed up with the speech therapist.  Denies any injuries or falls.  He is currently on aspirin and Plavix.  We will need to obtain CT of the head and will give Tylenol in the meantime to evaluate for improvement. Care handed off to oncoming provider pending remainder of workup.  Anticipate that he may be discharged home if his work-up is reassuring and his symptoms improve as he will  likely need outpatient follow-up.   Portions of this note were generated with Scientist, clinical (histocompatibility and immunogenetics)Dragon dictation software. Dictation errors may occur despite best attempts at proofreading.  Final Clinical Impression(s) / ED Diagnoses Final diagnoses:  Chest wall pain  Chronic nonintractable headache, unspecified headache type    Rx / DC Orders ED Discharge Orders    None       Dietrich PatesKhatri, Fantasy Donald, PA-C 10/10/20 1606    Rolan BuccoBelfi, Melanie, MD 10/10/20 2031

## 2020-10-11 DIAGNOSIS — R5381 Other malaise: Secondary | ICD-10-CM | POA: Diagnosis present

## 2020-10-11 DIAGNOSIS — Z87891 Personal history of nicotine dependence: Secondary | ICD-10-CM | POA: Diagnosis not present

## 2020-10-11 DIAGNOSIS — I10 Essential (primary) hypertension: Secondary | ICD-10-CM

## 2020-10-11 DIAGNOSIS — Z9049 Acquired absence of other specified parts of digestive tract: Secondary | ICD-10-CM | POA: Diagnosis not present

## 2020-10-11 DIAGNOSIS — I208 Other forms of angina pectoris: Secondary | ICD-10-CM | POA: Diagnosis present

## 2020-10-11 DIAGNOSIS — F1120 Opioid dependence, uncomplicated: Secondary | ICD-10-CM | POA: Diagnosis not present

## 2020-10-11 DIAGNOSIS — R079 Chest pain, unspecified: Secondary | ICD-10-CM | POA: Diagnosis not present

## 2020-10-11 DIAGNOSIS — I63412 Cerebral infarction due to embolism of left middle cerebral artery: Secondary | ICD-10-CM | POA: Diagnosis not present

## 2020-10-11 DIAGNOSIS — W1830XA Fall on same level, unspecified, initial encounter: Secondary | ICD-10-CM | POA: Diagnosis not present

## 2020-10-11 DIAGNOSIS — Z9081 Acquired absence of spleen: Secondary | ICD-10-CM | POA: Diagnosis not present

## 2020-10-11 DIAGNOSIS — S066X9A Traumatic subarachnoid hemorrhage with loss of consciousness of unspecified duration, initial encounter: Secondary | ICD-10-CM | POA: Diagnosis not present

## 2020-10-11 DIAGNOSIS — I358 Other nonrheumatic aortic valve disorders: Secondary | ICD-10-CM | POA: Diagnosis present

## 2020-10-11 DIAGNOSIS — I639 Cerebral infarction, unspecified: Secondary | ICD-10-CM | POA: Diagnosis not present

## 2020-10-11 DIAGNOSIS — F1410 Cocaine abuse, uncomplicated: Secondary | ICD-10-CM | POA: Diagnosis present

## 2020-10-11 DIAGNOSIS — R29810 Facial weakness: Secondary | ICD-10-CM | POA: Diagnosis present

## 2020-10-11 DIAGNOSIS — E785 Hyperlipidemia, unspecified: Secondary | ICD-10-CM | POA: Diagnosis present

## 2020-10-11 DIAGNOSIS — I130 Hypertensive heart and chronic kidney disease with heart failure and stage 1 through stage 4 chronic kidney disease, or unspecified chronic kidney disease: Secondary | ICD-10-CM | POA: Diagnosis present

## 2020-10-11 DIAGNOSIS — R519 Headache, unspecified: Secondary | ICD-10-CM | POA: Diagnosis not present

## 2020-10-11 DIAGNOSIS — Z8673 Personal history of transient ischemic attack (TIA), and cerebral infarction without residual deficits: Secondary | ICD-10-CM | POA: Diagnosis not present

## 2020-10-11 DIAGNOSIS — Y92231 Patient bathroom in hospital as the place of occurrence of the external cause: Secondary | ICD-10-CM | POA: Diagnosis not present

## 2020-10-11 DIAGNOSIS — R636 Underweight: Secondary | ICD-10-CM | POA: Diagnosis present

## 2020-10-11 DIAGNOSIS — R001 Bradycardia, unspecified: Secondary | ICD-10-CM | POA: Diagnosis present

## 2020-10-11 DIAGNOSIS — Z681 Body mass index (BMI) 19 or less, adult: Secondary | ICD-10-CM | POA: Diagnosis not present

## 2020-10-11 DIAGNOSIS — E876 Hypokalemia: Secondary | ICD-10-CM | POA: Diagnosis present

## 2020-10-11 DIAGNOSIS — Z20822 Contact with and (suspected) exposure to covid-19: Secondary | ICD-10-CM | POA: Diagnosis present

## 2020-10-11 DIAGNOSIS — I351 Nonrheumatic aortic (valve) insufficiency: Secondary | ICD-10-CM | POA: Diagnosis present

## 2020-10-11 DIAGNOSIS — R072 Precordial pain: Secondary | ICD-10-CM | POA: Diagnosis not present

## 2020-10-11 DIAGNOSIS — I609 Nontraumatic subarachnoid hemorrhage, unspecified: Secondary | ICD-10-CM | POA: Diagnosis not present

## 2020-10-11 DIAGNOSIS — N1831 Chronic kidney disease, stage 3a: Secondary | ICD-10-CM | POA: Diagnosis present

## 2020-10-11 DIAGNOSIS — F1920 Other psychoactive substance dependence, uncomplicated: Secondary | ICD-10-CM | POA: Diagnosis not present

## 2020-10-11 DIAGNOSIS — D580 Hereditary spherocytosis: Secondary | ICD-10-CM | POA: Diagnosis present

## 2020-10-11 DIAGNOSIS — H409 Unspecified glaucoma: Secondary | ICD-10-CM | POA: Diagnosis present

## 2020-10-11 DIAGNOSIS — I5032 Chronic diastolic (congestive) heart failure: Secondary | ICD-10-CM | POA: Diagnosis present

## 2020-10-11 DIAGNOSIS — R4701 Aphasia: Secondary | ICD-10-CM | POA: Diagnosis present

## 2020-10-11 LAB — COMPREHENSIVE METABOLIC PANEL
ALT: 82 U/L — ABNORMAL HIGH (ref 0–44)
AST: 73 U/L — ABNORMAL HIGH (ref 15–41)
Albumin: 4.4 g/dL (ref 3.5–5.0)
Alkaline Phosphatase: 133 U/L — ABNORMAL HIGH (ref 38–126)
Anion gap: 15 (ref 5–15)
BUN: 14 mg/dL (ref 6–20)
CO2: 24 mmol/L (ref 22–32)
Calcium: 9.9 mg/dL (ref 8.9–10.3)
Chloride: 96 mmol/L — ABNORMAL LOW (ref 98–111)
Creatinine, Ser: 1.65 mg/dL — ABNORMAL HIGH (ref 0.61–1.24)
GFR, Estimated: 52 mL/min — ABNORMAL LOW (ref >60)
Glucose, Bld: 92 mg/dL (ref 70–99)
Potassium: 3.8 mmol/L (ref 3.5–5.1)
Sodium: 135 mmol/L (ref 135–145)
Total Bilirubin: 0.9 mg/dL (ref 0.3–1.2)
Total Protein: 7.3 g/dL (ref 6.5–8.1)

## 2020-10-11 LAB — CBC
HCT: 48.6 % (ref 39.0–52.0)
Hemoglobin: 17.6 g/dL — ABNORMAL HIGH (ref 13.0–17.0)
MCH: 31.4 pg (ref 26.0–34.0)
MCHC: 36.2 g/dL — ABNORMAL HIGH (ref 30.0–36.0)
MCV: 86.8 fL (ref 80.0–100.0)
Platelets: 358 10*3/uL (ref 150–400)
RBC: 5.6 MIL/uL (ref 4.22–5.81)
RDW: 12.7 % (ref 11.5–15.5)
WBC: 7.1 10*3/uL (ref 4.0–10.5)
nRBC: 0 % (ref 0.0–0.2)

## 2020-10-11 LAB — PHOSPHORUS: Phosphorus: 2.8 mg/dL (ref 2.5–4.6)

## 2020-10-11 LAB — MAGNESIUM: Magnesium: 2.2 mg/dL (ref 1.7–2.4)

## 2020-10-11 MED ORDER — CLOPIDOGREL BISULFATE 75 MG PO TABS
75.0000 mg | ORAL_TABLET | Freq: Every day | ORAL | Status: DC
  Administered 2020-10-11 – 2020-10-13 (×3): 75 mg via ORAL
  Filled 2020-10-11 (×3): qty 1

## 2020-10-11 MED ORDER — POTASSIUM CHLORIDE 10 MEQ/100ML IV SOLN
10.0000 meq | INTRAVENOUS | Status: AC
  Administered 2020-10-11 (×2): 10 meq via INTRAVENOUS
  Filled 2020-10-11 (×2): qty 100

## 2020-10-11 MED ORDER — FENTANYL CITRATE (PF) 100 MCG/2ML IJ SOLN
25.0000 ug | INTRAMUSCULAR | Status: DC
  Filled 2020-10-11: qty 2

## 2020-10-11 MED ORDER — ASPIRIN EC 81 MG PO TBEC
81.0000 mg | DELAYED_RELEASE_TABLET | Freq: Every day | ORAL | Status: DC
  Administered 2020-10-11 – 2020-10-13 (×3): 81 mg via ORAL
  Filled 2020-10-11 (×3): qty 1

## 2020-10-11 MED ORDER — HYDROMORPHONE HCL 1 MG/ML IJ SOLN
0.5000 mg | INTRAMUSCULAR | Status: DC | PRN
  Administered 2020-10-11: 0.5 mg via INTRAVENOUS
  Filled 2020-10-11: qty 1

## 2020-10-11 MED ORDER — HYDROMORPHONE HCL 1 MG/ML IJ SOLN
1.0000 mg | INTRAMUSCULAR | Status: DC | PRN
  Administered 2020-10-11 – 2020-10-12 (×5): 1 mg via INTRAVENOUS
  Filled 2020-10-11 (×5): qty 1

## 2020-10-11 NOTE — Progress Notes (Signed)
PROGRESS NOTE    Alejandro Perez  ZOX:[id number] DOB: 03-28-1976 DOA: 10/10/2020  PCP: Pcp, No    Brief Narrative:  HPI: Alejandro Perez is a 44 y.o. male with medical history significant of recent stroke left MCA and subacute right corona radiata and other chronic lacunar infarct, HTN, LVH, remote history of TTP, status post splenectomy, CKD stage II. It appeared that on last mission, the patient had persistent with dizziness and falling to his left, and CT head showed acute infarct of the left MCA territory and a subacute right corona radiata with some other chronic lacunar infarct.  On same admission, patient was complaining about chest pain, CT angiogram was done which came back negative for dissections.  Patient UDS was positive for cocaine.  Patient was discharged home and remained stable until 3 days ago, he started to experience similar chest pain as last time centrally located persistent sharp-like, no exacerbation or relieving factors.  Associated with palpitations.  And again he started to experience global headache, denied any nauseous vomit, no vision changes, he also reported worsening of the left arm weakness.  Denied any leg weakness no speech problems no vision changes.  And he also denied any recent cocaine use.  ED Course: Given the patient complaining about headache, CT head was done, which showed new hypoattenuation along the ventral body of the left corpus callosum eccentric to the left suspicious for acute subacute infarct.  Neurologyand MRI ordered.  UDS ordered to rule out recent cocaine use.  Assessment & Plan:  Acute left CVA -No electrolyte about the etiology, might be just a evolving stroke from last time. -Neurology on board, MRI ordered -Continue risk factor modification, continue aspirin Plavix, statin.  Will modify his BP meds regimen as below. - greatly appreciate neurology consult and management esp with  recommendation on BP and anticoagulation, reducing bp and  BP goal. -pt to continue dual antiplatelet therapy with asa and plavix and cont Plavix thereafter.  -- again per neurology pt to  aspirin 81 mg daily and clopidogrel 75 mg daily prior to admission, now on aspirin 81 mg daily and clopidogrel 75 mg daily.  For 3 weeks and then clopidogrel alone  Chest pain -2 sets troponin negative EKG no change as compared to last time, ACS ruled out. 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  UDS ordered. -We will treat as cocaine induced angina, Ativan 3 times daily x2 days.  Change metoprolol to Coreg. - echo pending.chest pain complaints , I suspect due to his ivdu history that he is pain seeking to a certain degree. We will increase his dilaudid. Pt did have a tee on the 9th.will start iv ppi and pt also had a cta on the 4th and it was negative. _ pt did not reports any chest pain to me.   Uncontrolled hypertension -Along with borderline bradycardia, will stop clonidine -Change metoprolol to Coreg as above, add as needed hydralazine. -Discontinue HCTZ given his CKD conditions, continue Lasix. Home bp meds resumed. 10/11/20 1400  --  68  67  14  126/72  --  95 %  --  --  --  -- MI   BP has been intermittent and pt gets agitated and abusive. We have also started ativan 1 mg po q12h.    Bradycardia -As above Blood pressure (!) 191/94, pulse 74, temperature 97.8 F (36.6 C), temperature source Oral, resp. rate 20, height  (1.88 m), weight 67 kg, SpO2  100 %.   CKD stage II -Appears to be euvolemic. Lab Results  Component Value Date   CREATININE 1.87 (H) 10/10/2020   CREATININE 1.49 (H) 09/27/2020   CREATININE 1.60 (H) 09/26/2020     DVT prophylaxis: scd;s Code Status: Full code Family Communication: Wife at bedside Disposition Plan: Expect more than 2 midnight hospital stay for new stroke work-up pending probably cocaine related anginal treatment. Consults called:  Neurology   Subjective/Overview: Pt is cooperative c/o pain in his head and informed him we will treat his pain.Pt states he knows he has a stroke the patient denies any other complaints and has no deficits neurologically on exam.no dysarthria or dysphagia. 10/11/20 Pt seen in am and he Is alert,awake and oriented and denies any complaints other than headaches.   Objective: Vitals:   10/11/20 1120 10/11/20 1151 10/11/20 1200 10/11/20 1205  BP: (!) 154/87  (!) 149/93 (!) 147/95  Pulse: 62 63 (!) 58 66  Resp: 14 (!) 26 16 20   Temp:      TempSrc:      SpO2: 95% 95% 92% 96%  Weight:      Height:       SpO2: 96 %  Intake/Output Summary (Last 24 hours) at 10/11/2020 1252 Last data filed at 10/11/2020 0551 Gross per 24 hour  Intake --  Output 1600 ml  Net -1600 ml   Filed Weights   10/10/20 1317  Weight: 73.5 kg    Examination: Blood pressure (!) 147/95, pulse 66, temperature 98 F (36.7 C), temperature source Oral, resp. rate 20, height 6\' 2"  (1.88 m), weight 73.5 kg, SpO2 96 %. General exam: Appears calm and comfortable  HEENT:EOMI, perrl  Respiratory system: Clear to auscultation. Respiratory effort normal. Cardiovascular system: S1 & S2 heard, RRR. No JVD, murmurs, rubs, gallops or clicks. No pedal edema. Gastrointestinal system: Abdomen is nondistended, soft and nontender. No organomegaly or masses felt. Normal bowel sounds heard. Central nervous system: Alert and oriented.CN grossly intact No focal neurological deficits. Extremities: pt moving all 4 ext no weakness or paralysis. Skin: No rashes, lesions or ulcers Psychiatry: Judgement and insight appear normal. Mood & affect appropriate.    Data Reviewed: I have personally reviewed following labs and imaging studies  Labs  No results for input(s): CKTOTAL, CKMB, TROPONINI in the last 72 hours. Lab Results  Component Value Date   WBC 9.2 10/10/2020   HGB 16.5 10/10/2020   HCT 45.7 10/10/2020   MCV 85.6  10/10/2020   PLT 491 (H) 10/10/2020    Recent Labs  Lab 10/10/20 1328  NA 136  K 3.3*  CL 89*  CO2 32  BUN 13  CREATININE 1.87*  CALCIUM 10.1  PROT 6.9  BILITOT 1.0  ALKPHOS 120  ALT 65*  AST 36  GLUCOSE 90   Lab Results  Component Value Date   CHOL 186 09/24/2020   HDL 44 09/24/2020   LDLCALC 130 (H) 09/24/2020   TRIG 62 09/24/2020    Radiology Studies: DG Chest 2 View  Result Date: 10/10/2020 CLINICAL DATA:  Chest pain and headache 4 days. EXAM: CHEST - 2 VIEW COMPARISON:  09/25/2020 and CT 09/23/2020 FINDINGS: Lungs are adequately inflated without focal airspace consolidation or effusion. Calcified granuloma over the right middle lobe. Cardiomediastinal silhouette and remainder of the exam is unchanged. IMPRESSION: No active cardiopulmonary disease. Electronically Signed   By: Elberta Fortis M.D.   On: 10/10/2020 14:30   CT Head Wo Contrast  Result Date: 10/10/2020 CLINICAL DATA:  Headache, recent stroke EXAM: CT HEAD WITHOUT CONTRAST TECHNIQUE: Contiguous axial images were obtained from the base of the skull through the vertex without intravenous contrast. COMPARISON:  09/25/2020 FINDINGS: Brain: New hypoattenuation along the ventral body of the corpus callosum eccentric to the left. Evolving recent infarcts of the posterior left MCA territory and right corona radiata. Chronic infarcts of the left corona radiata and right thalamus. No acute intracranial hemorrhage or significant mass effect. No hydrocephalus or extra-axial collection. Vascular: No hyperdense vessel. There is atherosclerotic calcification at the skull base. Skull: Calvarium is unremarkable. Sinuses/Orbits: No acute finding. Other: None. IMPRESSION: New hypoattenuation along the ventral body of the corpus callosum eccentric to the left suspicious for acute or subacute infarction. Evolving recent infarcts of the posterior left MCA territory and right corona radiata. No acute intracranial hemorrhage. These results  were called by telephone at the time of interpretation on 10/10/2020 at 4:47 pm to provider Trudee Grip, who verbally acknowledged these results. Electronically Signed   By: Guadlupe Spanish M.D.   On: 10/10/2020 16:47   MR ANGIO HEAD WO CONTRAST  Result Date: 10/10/2020 CLINICAL DATA:  Initial evaluation for neuro deficit, stroke suspected. EXAM: MRI HEAD WITHOUT CONTRAST MRA HEAD WITHOUT CONTRAST MRA NECK WITHOUT CONTRAST TECHNIQUE: Multiplanar, multiecho pulse sequences of the brain and surrounding structures were obtained without intravenous contrast. Angiographic images of the Circle of Willis were obtained using MRA technique without intravenous contrast. Angiographic images of the neck were obtained using MRA technique without intravenous contrast. Carotid stenosis measurements (when applicable) are obtained utilizing NASCET criteria, using the distal internal carotid diameter as the denominator. COMPARISON:  Prior CT from earlier same day as well as earlier studies. FINDINGS: MRI HEAD FINDINGS Brain: Cerebral volume within normal limits for age. Multiple scattered remote lacunar infarcts noted about the left basal ganglia/corona radiata and right thalamus. Perforator type lacunar infarct involving the posterior right basal ganglia/corona radiata demonstrates mild residual diffusion abnormality without ADC correlate, consistent with a late subacute ischemic infarct (series 5, image 83). No associated hemorrhage or mass effect. Confluent area of restricted diffusion involving the body of the left corpus callosum measures 4.3 x 1.7 cm, consistent with an acute left ACA territory infarct (series 5, image 83). Mild diffusion abnormality/ischemia noted within the contralateral right corpus callosum as well (series 6, image 34). No associated hemorrhage or mass effect. Patchy and confluent restricted diffusion involving the left parieto-occipital region is seen (series 5, image 84). Associated signal loss seen  on ADC map (series 6, image 34), consistent with an acute left MCA distribution infarct. 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 Cortically based T1 hyperintensity and susceptibility artifact consistent with laminar necrosis and/or petechial hemorrhage. No significant regional mass effect. Additional punctate acute ischemic nonhemorrhagic infarct noted anteriorly within the left frontal centrum semi ovale (series 5, image 90). No other evidence for acute or subacute ischemia. Gray-white matter differentiation otherwise maintained. No mass lesion or midline shift. No hydrocephalus. Subtle 2 mm FLAIR hyperintensity with associated susceptibility artifacts seen overlying the left frontal convexity, consistent with a small subdural hematoma, likely subacute in nature (series 9, image 16). No associated mass effect. Additional faint component noted posteriorly at the left parietal convexity. Pituitary gland suprasellar region normal. Vascular: Major intracranial vascular flow voids are maintained. Skull and upper cervical spine: Craniocervical junction normal. Bone marrow signal intensity within normal limits. No visible scalp soft tissue injury. Sinuses/Orbits: Globes and orbital soft tissues grossly within  normal limits. Scattered mucosal thickening noted within the ethmoidal air cells and maxillary sinuses. Paranasal sinuses are otherwise clear. No significant mastoid effusion. Inner ear structures normal. Other: None. MRA HEAD FINDINGS ANTERIOR CIRCULATION: Examination mildly degraded by motion artifact. Visualized distal cervical segments of the internal carotid arteries are patent with antegrade flow. Petrous segments patent bilaterally. Atheromatous irregularity within the cavernous/supraclinoid segments without high-grade stenosis. A1 segments patent bilaterally. Normal anterior communicating artery complex. There are severe near occlusive stenoses  involving the proximal A2 segments bilaterally (series 18, image 114 on the right, series 18, image 117 on the left). Additional severe downstream 8 3 stenosis (series 18, image 152). ACAs remain perfused to their distal aspects at this time. No M1 stenosis or occlusion. Negative MCA bifurcations. Distal right MCA branches remain perfused, although demonstrate advanced small vessel atheromatous irregularity for age. On the left, there is focal signal loss within a proximal left M2 branch, inferior division (series 18, image 109). Finding could reflect severe high-grade stenosis or possibly subocclusive thrombus. Left MCA branches otherwise perfused, although demonstrate advanced age-related small vessel atheromatous irregularity. POSTERIOR CIRCULATION: Visualized vertebral arteries patent to the vertebrobasilar junction without stenosis. Left PICA patent proximally. Basilar patent to its distal aspect without stenosis. Superior cerebral arteries patent bilaterally. Predominant fetal type origin of the PCAs bilaterally. There is a severe near occlusive stenosis involving the mid left P2 segment (series 26, image 8). PCAs otherwise patent without proximal high-grade stenosis, although demonstrate extensive distal small vessel atheromatous irregularity. MRA NECK FINDINGS AORTIC ARCH: Examination somewhat limited by lack of IV contrast. Visualized aortic arch of normal caliber with normal branch pattern. No hemodynamically significant stenosis seen about the origin of the great vessels. RIGHT CAROTID SYSTEM: Right CCA patent from its origin to the bifurcation without stenosis. Mild atheromatous narrowing of no more than 20% at the origin of the right ICA at the bifurcation. Right ICA patent distally to the skull base without stenosis, evidence for dissection, or occlusion. LEFT CAROTID SYSTEM: Left CCA patent from its origin to the bifurcation without stenosis. Atheromatous narrowing of up to approximately 40% at the  origin of the left ICA (series 38, image 5). Left ICA otherwise patent to the skull base without stenosis, evidence for dissection or occlusion. VERTEBRAL ARTERIES: Both vertebral arteries arise from the subclavian arteries. Vertebral arteries diminutive, although the right is slightly dominant. Vertebral arteries patent within the neck without appreciable stenosis, evidence for dissection or occlusion. IMPRESSION: MRI HEAD IMPRESSION: 1. 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 Additional patchy acute ischemic infarcts involving the left parieto-occipital region, left MCA distribution. This is superimposed on an underlying late subacute left MCA infarct at this location. Evidence for associated laminar necrosis and/or petechial hemorrhage. 3. 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 4. Underlying multiple remote lacunar infarcts involving the left basal ganglia/corona radiata and right thalamus. 5. Tiny 2 mm subdural hematoma overlying the left frontal convexity as above, likely subacute in nature. No associated mass effect. MRA HEAD IMPRESSION: 1. Focal signal loss within a proximal left M2 branch, inferior division. Finding could reflect severe high-grade stenosis or possibly subocclusive thrombus. 2. 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 3. Severe mid left P2 stenosis. There is predominant fetal type origin of the PCAs bilaterally. 4. Extensive small vessel atheromatous irregularity throughout the intracranial circulation, advanced for age. MRA NECK  IMPRESSION: 1. Mild atheromatous narrowing about the origins of both ICAs, measuring up to 40% on the left and 20% on the right. 2. Diminutive but patent vertebral arteries within the neck. Right vertebral artery slightly dominant. Case discussed by telephone at the time of  interpretation on 10/10/2020 at 9:14 pm to provider [facility], who verbally acknowledged these results. Electronically Signed   By: Rise Mu M.D.   On: 10/10/2020 21:16   MR ANGIO NECK WO CONTRAST  Result Date: 10/10/2020 CLINICAL DATA:  Initial evaluation for neuro deficit, stroke suspected. EXAM: MRI HEAD WITHOUT CONTRAST MRA HEAD WITHOUT CONTRAST MRA NECK WITHOUT CONTRAST TECHNIQUE: Multiplanar, multiecho pulse sequences of the brain and surrounding structures were obtained without intravenous contrast. Angiographic images of the Circle of Willis were obtained using MRA technique without intravenous contrast. Angiographic images of the neck were obtained using MRA technique without intravenous contrast. Carotid stenosis measurements (when applicable) are obtained utilizing NASCET criteria, using the distal internal carotid diameter as the denominator. COMPARISON:  Prior CT from earlier same day as well as earlier studies. FINDINGS: MRI HEAD FINDINGS Brain: Cerebral volume within normal limits for age. Multiple scattered remote lacunar infarcts noted about the left basal ganglia/corona radiata and right thalamus. Perforator type lacunar infarct involving the posterior right basal ganglia/corona radiata demonstrates mild residual diffusion abnormality without ADC correlate, consistent with a late subacute ischemic infarct (series 5, image 83). No associated hemorrhage or mass effect. Confluent area of restricted diffusion involving the body of the left corpus callosum measures 4.3 x 1.7 cm, consistent with an acute left ACA territory infarct (series 5, image 83). Mild diffusion abnormality/ischemia noted within the contralateral right corpus callosum as well (series 6, image 34). No associated hemorrhage or mass effect. Patchy and confluent restricted diffusion involving the left parieto-occipital region is seen (series 5, image 84). Associated signal loss seen on ADC map (series 6, image 34),  consistent with an acute left MCA distribution infarct. 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 Cortically based T1 hyperintensity and susceptibility artifact consistent with laminar necrosis and/or petechial hemorrhage. No significant regional mass effect. Additional punctate acute ischemic nonhemorrhagic infarct noted anteriorly within the left frontal centrum semi ovale (series 5, image 90). No other evidence for acute or subacute ischemia. Gray-white matter differentiation otherwise maintained. No mass lesion or midline shift. No hydrocephalus. Subtle 2 mm FLAIR hyperintensity with associated susceptibility artifacts seen overlying the left frontal convexity, consistent with a small subdural hematoma, likely subacute in nature (series 9, image 16). No associated mass effect. Additional faint component noted posteriorly at the left parietal convexity. Pituitary gland suprasellar region normal. Vascular: Major intracranial vascular flow voids are maintained. Skull and upper cervical spine: Craniocervical junction normal. Bone marrow signal intensity within normal limits. No visible scalp soft tissue injury. Sinuses/Orbits: Globes and orbital soft tissues grossly within normal limits. Scattered mucosal thickening noted within the ethmoidal air cells and maxillary sinuses. Paranasal sinuses are otherwise clear. No significant mastoid effusion. Inner ear structures normal. Other: None. MRA HEAD FINDINGS ANTERIOR CIRCULATION: Examination mildly degraded by motion artifact. Visualized distal cervical segments of the internal carotid arteries are patent with antegrade flow. Petrous segments patent bilaterally. Atheromatous irregularity within the cavernous/supraclinoid segments without high-grade stenosis. A1 segments patent bilaterally. Normal anterior communicating artery complex. There are severe near occlusive stenoses involving the proximal A2 segments  bilaterally (series 18, image 114 on the right, series 18, image 117 on the left). Additional severe downstream  8 3 stenosis (series 18, image 152). ACAs remain perfused to their distal aspects at this time. No M1 stenosis or occlusion. Negative MCA bifurcations. Distal right MCA branches remain perfused, although demonstrate advanced small vessel atheromatous irregularity for age. On the left, there is focal signal loss within a proximal left M2 branch, inferior division (series 18, image 109). Finding could reflect severe high-grade stenosis or possibly subocclusive thrombus. Left MCA branches otherwise perfused, although demonstrate advanced age-related small vessel atheromatous irregularity. POSTERIOR CIRCULATION: Visualized vertebral arteries patent to the vertebrobasilar junction without stenosis. Left PICA patent proximally. Basilar patent to its distal aspect without stenosis. Superior cerebral arteries patent bilaterally. Predominant fetal type origin of the PCAs bilaterally. There is a severe near occlusive stenosis involving the mid left P2 segment (series 26, image 8). PCAs otherwise patent without proximal high-grade stenosis, although demonstrate extensive distal small vessel atheromatous irregularity. MRA NECK FINDINGS AORTIC ARCH: Examination somewhat limited by lack of IV contrast. Visualized aortic arch of normal caliber with normal branch pattern. No hemodynamically significant stenosis seen about the origin of the great vessels. RIGHT CAROTID SYSTEM: Right CCA patent from its origin to the bifurcation without stenosis. Mild atheromatous narrowing of no more than 20% at the origin of the right ICA at the bifurcation. Right ICA patent distally to the skull base without stenosis, evidence for dissection, or occlusion. LEFT CAROTID SYSTEM: Left CCA patent from its origin to the bifurcation without stenosis. Atheromatous narrowing of up to approximately 40% at the origin of the left ICA (series 38,  image 5). Left ICA otherwise patent to the skull base without stenosis, evidence for dissection or occlusion. VERTEBRAL ARTERIES: Both vertebral arteries arise from the subclavian arteries. Vertebral arteries diminutive, although the right is slightly dominant. Vertebral arteries patent within the neck without appreciable stenosis, evidence for dissection or occlusion. IMPRESSION: MRI HEAD IMPRESSION: 1. 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 Additional patchy acute ischemic infarcts involving the left parieto-occipital region, left MCA distribution. This is superimposed on an underlying late subacute left MCA infarct at this location. Evidence for associated laminar necrosis and/or petechial hemorrhage. 3. 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 4. Underlying multiple remote lacunar infarcts involving the left basal ganglia/corona radiata and right thalamus. 5. Tiny 2 mm subdural hematoma overlying the left frontal convexity as above, likely subacute in nature. No associated mass effect. MRA HEAD IMPRESSION: 1. Focal signal loss within a proximal left M2 branch, inferior division. Finding could reflect severe high-grade stenosis or possibly subocclusive thrombus. 2. 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 3. Severe mid left P2 stenosis. There is predominant fetal type origin of the PCAs bilaterally. 4. Extensive small vessel atheromatous irregularity throughout the intracranial circulation, advanced for age. MRA NECK IMPRESSION: 1. Mild atheromatous narrowing about the origins of both ICAs, measuring up to 40% on the left and 20% on the right. 2. Diminutive but patent vertebral arteries within the neck. Right vertebral artery slightly dominant. Case discussed by telephone at the time of interpretation on 10/10/2020 at  9:14 pm to provider [facility], who verbally acknowledged these results. Electronically Signed   By: Rise Mu M.D.   On: 10/10/2020 21:16   MR BRAIN WO CONTRAST  Result Date: 10/10/2020 CLINICAL DATA:  Initial evaluation for neuro deficit, stroke suspected. EXAM: MRI HEAD WITHOUT CONTRAST MRA HEAD WITHOUT CONTRAST MRA NECK  WITHOUT CONTRAST TECHNIQUE: Multiplanar, multiecho pulse sequences of the brain and surrounding structures were obtained without intravenous contrast. Angiographic images of the Circle of Willis were obtained using MRA technique without intravenous contrast. Angiographic images of the neck were obtained using MRA technique without intravenous contrast. Carotid stenosis measurements (when applicable) are obtained utilizing NASCET criteria, using the distal internal carotid diameter as the denominator. COMPARISON:  Prior CT from earlier same day as well as earlier studies. FINDINGS: MRI HEAD FINDINGS Brain: Cerebral volume within normal limits for age. Multiple scattered remote lacunar infarcts noted about the left basal ganglia/corona radiata and right thalamus. Perforator type lacunar infarct involving the posterior right basal ganglia/corona radiata demonstrates mild residual diffusion abnormality without ADC correlate, consistent with a late subacute ischemic infarct (series 5, image 83). No associated hemorrhage or mass effect. Confluent area of restricted diffusion involving the body of the left corpus callosum measures 4.3 x 1.7 cm, consistent with an acute left ACA territory infarct (series 5, image 83). Mild diffusion abnormality/ischemia noted within the contralateral right corpus callosum as well (series 6, image 34). No associated hemorrhage or mass effect. Patchy and confluent restricted diffusion involving the left parieto-occipital region is seen (series 5, image 84). Associated signal loss seen on ADC map (series 6, image 34), consistent with an acute left MCA  distribution infarct. 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 Cortically based T1 hyperintensity and susceptibility artifact consistent with laminar necrosis and/or petechial hemorrhage. No significant regional mass effect. Additional punctate acute ischemic nonhemorrhagic infarct noted anteriorly within the left frontal centrum semi ovale (series 5, image 90). No other evidence for acute or subacute ischemia. Gray-white matter differentiation otherwise maintained. No mass lesion or midline shift. No hydrocephalus. Subtle 2 mm FLAIR hyperintensity with associated susceptibility artifacts seen overlying the left frontal convexity, consistent with a small subdural hematoma, likely subacute in nature (series 9, image 16). No associated mass effect. Additional faint component noted posteriorly at the left parietal convexity. Pituitary gland suprasellar region normal. Vascular: Major intracranial vascular flow voids are maintained. Skull and upper cervical spine: Craniocervical junction normal. Bone marrow signal intensity within normal limits. No visible scalp soft tissue injury. Sinuses/Orbits: Globes and orbital soft tissues grossly within normal limits. Scattered mucosal thickening noted within the ethmoidal air cells and maxillary sinuses. Paranasal sinuses are otherwise clear. No significant mastoid effusion. Inner ear structures normal. Other: None. MRA HEAD FINDINGS ANTERIOR CIRCULATION: Examination mildly degraded by motion artifact. Visualized distal cervical segments of the internal carotid arteries are patent with antegrade flow. Petrous segments patent bilaterally. Atheromatous irregularity within the cavernous/supraclinoid segments without high-grade stenosis. A1 segments patent bilaterally. Normal anterior communicating artery complex. 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 Additional severe downstream 8 3 stenosis (series 18, image 152). ACAs remain perfused to their distal aspects at this time. No M1 stenosis or occlusion. Negative MCA bifurcations. Distal right MCA branches remain perfused, although demonstrate advanced small vessel atheromatous irregularity for age. On the left, there is focal signal loss within a proximal left M2 branch, inferior division (series 18, image 109). Finding could reflect severe high-grade stenosis or possibly subocclusive thrombus. Left MCA branches otherwise perfused, although demonstrate advanced age-related small vessel atheromatous irregularity. POSTERIOR CIRCULATION: Visualized vertebral arteries patent to the vertebrobasilar junction without stenosis. Left PICA patent proximally. Basilar patent to its distal aspect without stenosis.  Superior cerebral arteries patent bilaterally. Predominant fetal type origin of the PCAs bilaterally. There is a severe near occlusive stenosis involving the mid left P2 segment (series 26, image 8). PCAs otherwise patent without proximal high-grade stenosis, although demonstrate extensive distal small vessel atheromatous irregularity. MRA NECK FINDINGS AORTIC ARCH: Examination somewhat limited by lack of IV contrast. Visualized aortic arch of normal caliber with normal branch pattern. No hemodynamically significant stenosis seen about the origin of the great vessels. RIGHT CAROTID SYSTEM: Right CCA patent from its origin to the bifurcation without stenosis. Mild atheromatous narrowing of no more than 20% at the origin of the right ICA at the bifurcation. Right ICA patent distally to the skull base without stenosis, evidence for dissection, or occlusion. LEFT CAROTID SYSTEM: Left CCA patent from its origin to the bifurcation without stenosis. Atheromatous narrowing of up to approximately 40% at the origin of the left ICA (series 38, image 5). Left ICA otherwise patent to  the skull base without stenosis, evidence for dissection or occlusion. VERTEBRAL ARTERIES: Both vertebral arteries arise from the subclavian arteries. Vertebral arteries diminutive, although the right is slightly dominant. Vertebral arteries patent within the neck without appreciable stenosis, evidence for dissection or occlusion. IMPRESSION: MRI HEAD IMPRESSION: 1. 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 Additional patchy acute ischemic infarcts involving the left parieto-occipital region, left MCA distribution. This is superimposed on an underlying late subacute left MCA infarct at this location. Evidence for associated laminar necrosis and/or petechial hemorrhage. 3. 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 4. Underlying multiple remote lacunar infarcts involving the left basal ganglia/corona radiata and right thalamus. 5. Tiny 2 mm subdural hematoma overlying the left frontal convexity as above, likely subacute in nature. No associated mass effect. MRA HEAD IMPRESSION: 1. Focal signal loss within a proximal left M2 branch, inferior division. Finding could reflect severe high-grade stenosis or possibly subocclusive thrombus. 2. 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 3. Severe mid left P2 stenosis. There is predominant fetal type origin of the PCAs bilaterally. 4. Extensive small vessel atheromatous irregularity throughout the intracranial circulation, advanced for age. MRA NECK IMPRESSION: 1. Mild atheromatous narrowing about the origins of both ICAs, measuring up to 40% on the left and 20% on the right. 2. Diminutive but patent vertebral arteries within the neck. Right vertebral artery slightly dominant. Case discussed by telephone at the time of interpretation on 10/10/2020 at 9:14 pm to provider [facility], who  verbally acknowledged these results. Electronically Signed   By: Rise Mu M.D.   On: 10/10/2020 21:16   Current Facility-Administered Medications (Cardiovascular):  .  amLODipine (NORVASC) tablet 10 mg .  atorvastatin (LIPITOR) tablet 40 mg .  carvedilol (COREG) tablet 3.125 mg .  furosemide (LASIX) tablet 40 mg .  hydrALAZINE (APRESOLINE) tablet 25 mg  Current Outpatient Medications (Cardiovascular):  .  amLODipine (NORVASC) 10 MG tablet, Take 1 tablet (10 mg total) by mouth daily. Marland Kitchen  atorvastatin (LIPITOR) 40 MG tablet, Take 1 tablet (40 mg total) by mouth daily. .  cloNIDine (CATAPRES) 0.2 MG tablet, Take 1 tablet (0.2 mg total) by mouth 2 (two) times daily. .  furosemide (LASIX) 40 MG tablet, Take 1 tablet (40 mg total) by mouth daily. .  hydrochlorothiazide (MICROZIDE) 12.5 MG capsule, Take 1 capsule (12.5 mg total) by mouth daily. .  metoprolol tartrate (LOPRESSOR) 25 MG  tablet, Take 0.5 tablets (12.5 mg total) by mouth 2 (two) times daily.    Current Facility-Administered Medications (Analgesics):  .  acetaminophen (TYLENOL) tablet 650 mg .  HYDROmorphone (DILAUDID) injection 1 mg  Current Outpatient Medications (Analgesics):  .  acetaminophen (TYLENOL) 325 MG tablet, Take 650 mg by mouth every 6 (six) hours as needed for moderate pain or headache. Marland Kitchen  aspirin EC 81 MG EC tablet, Take 1 tablet (81 mg total) by mouth daily. Swallow whole. .  oxyCODONE (OXY IR/ROXICODONE) 5 MG immediate release tablet, Take 2 tablets (10 mg total) by mouth every 6 (six) hours as needed for moderate pain or severe pain. (Patient not taking: Reported on 10/10/2020)  Current Facility-Administered Medications (Hematological):  .  folic acid (FOLVITE) tablet 1 mg .  vitamin B-12 (CYANOCOBALAMIN) tablet 1,000 mcg  Current Outpatient Medications (Hematological):  .  clopidogrel (PLAVIX) 75 MG tablet, Take 1 tablet (75 mg total) by mouth daily. Take for 18 days then stop .  folic acid  (FOLVITE) 1 MG tablet, Take 1 tablet (1 mg total) by mouth daily. .  vitamin B-12 1000 MCG tablet, Take 1 tablet (1,000 mcg total) by mouth daily.  Current Facility-Administered Medications (Other):  .   stroke: mapping our early stages of recovery book .  alum & mag hydroxide-simeth (MAALOX/MYLANTA) 200-200-20 MG/5ML suspension 30 mL **AND** lidocaine (XYLOCAINE) 2 % viscous mouth solution 15 mL *  .  LORazepam (ATIVAN) tablet 1 mg .  multivitamin with minerals tablet 1 tablet .  pantoprazole (PROTONIX) EC tablet 40 mg  Current Outpatient Medications (Other):  Marland Kitchen  Multiple Vitamin (MULTIVITAMIN WITH MINERALS) TABS tablet, Take 1 tablet by mouth daily. .  pantoprazole (PROTONIX) 40 MG tablet, Take 1 tablet (40 mg total) by mouth daily. .  polyethylene glycol (MIRALAX / GLYCOLAX) 17 g packet, Take 17 g by mouth daily. (Patient not taking: Reported on 10/10/2020) .  senna-docusate (SENOKOT-S) 8.6-50 MG tablet, Take 2 tablets by mouth at bedtime as needed for mild constipation or moderate constipation. (Patient not taking: Reported on 10/10/2020)  Anti-infectives (From admission, onward)   None       Continuous Infusions:   LOS: 0 days    Gertha Calkin, MD [facility] Pager [phone] How to contact the [facility] Attending or Consulting provider 7A - 7P or covering provider during after hours 7P -7A, for this patient?    1. Check the care team in [facility] and look for a) attending/consulting [facility] provider listed and b) the [facility] team listed 2. Log into [email or web address] and use [place]'s universal password to access. If you do not have the password, please contact the hospital operator. 3. Locate the [facility] provider you are looking for under [facility] and page to a number that you can be directly reached. 4. If you still have difficulty reaching the provider, please page the [facility] (Director on Call) for the Hospitalists listed on amion for assistance. [email or web address] Password  [facility] 10/11/2020, 12:52 PM

## 2020-10-11 NOTE — ED Notes (Signed)
Pt cursing at this rn, angrily yelling stating tylenol is never going to work. Message sent to provider. Pt took tylenol anyway but was verbally abusive with this rn. Pt provided call bell and phone.

## 2020-10-11 NOTE — Progress Notes (Signed)
PT Cancellation Note  Patient Details Name: Alejandro Perez MRN: [id number] DOB: 04-13-1976   Cancelled Treatment:    Reason Eval/Treat Not Completed: Pain limiting ability to participate Pt reporting he is in too much pain to participate. Will follow up as schedule allows.   Cindee Salt, DPT  Acute Rehabilitation Services  Pager: [phone] Office: [phone]    Lehman Prom 10/11/2020, 9:38 AM

## 2020-10-11 NOTE — ED Notes (Signed)
Report called to [place], RN. Denies any questions.

## 2020-10-11 NOTE — Consult Note (Addendum)
Stroke Neurology Follow Up Visit Note     History of Present Illness:  Alejandro Perez is an 44 y.o. 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 CT head done in ED today shows an acute-subacute stroke at body of corpus callosum, which was not present on recent prior imaging; also seen were the known recent left MCA and right corona radiata strokes.    Date last known well: 3 days pta Time last known well: nclear tPA Given: No:   MRS:   1   This am patient is sitting up in bed along with his girlfriend.  He continues to have significant expressive aphasia with word finding difficulties and seems quite frustrated with it. MRI scan of the brain shows acute left ACA as well as additional patchy left MCA parieto-occipital region infarcts with late subacute left MCA infarct.  Patient states he is quit using cocaine but has still been using marijuana.  He states he was compliant with his medical treatment following previous hospital admission for stroke in August. Past Medical History:  Diagnosis Date  . Anemia   . Chest pain   . Glaucoma   . Hypertension   . LVH (left ventricular hypertrophy)   . Spleen enlarged   . Stroke (HCC)   . Thrombocytopenia (HCC)      Past Surgical History:  Procedure Laterality Date  . BUBBLE STUDY  09/28/2020   Procedure: BUBBLE STUDY;  Surgeon: [name], MD;  Location: [facility] ENDOSCOPY;  Service: Cardiovascular;;  . CHOLECYSTECTOMY    . 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 TEE WITHOUT CARDIOVERSION  N/A 09/28/2020   Procedure: TRANSESOPHAGEAL ECHOCARDIOGRAM (TEE);  Surgeon: [name], MD;  Location: [facility] ENDOSCOPY;  Service: Cardiovascular;  Laterality: N/A;    Family History  Problem Relation Age of Onset  . Sickle cell trait Mother      Allergies:  Allergies  Allergen Reactions  . Morphine And Related Other (See Comments)    Severe abdominal pain  . Fentanyl Other (See Comments)    Mania     Medications: I have reviewed the patient's current medications.  Test Results: CBC:  Recent Labs  Lab 10/10/20 1328  WBC 9.2  HGB 16.5  HCT 45.7  MCV 85.6  PLT 491*   Basic Metabolic Panel:  Recent Labs  Lab 10/10/20 1328  NA 136  K 3.3*  CL 89*  CO2 32  GLUCOSE 90  BUN 13  CREATININE 1.87*  CALCIUM 10.1   Liver Function Tests: Recent Labs  Lab 10/10/20 1328  AST 36  ALT 65*  ALKPHOS 120  BILITOT 1.0  PROT 6.9  ALBUMIN 4.5   Recent Labs  Lab 10/10/20 1328  LIPASE 31    Microbiology:  Results for orders placed or performed during the hospital encounter of 10/10/20  Respiratory Panel by RT PCR (Flu A&B, Covid) - Nasopharyngeal Swab     Status: None   Collection Time: 10/10/20  5:13 PM   Specimen: Nasopharyngeal Swab; Nasopharyngeal(NP) swabs in vial transport medium  Result Value Ref Range Status   SARS  Coronavirus 2 by RT PCR NEGATIVE NEGATIVE Final    Comment: (NOTE) SARS-CoV-2 target nucleic acids are NOT DETECTED.  The SARS-CoV-2 RNA is generally detectable in upper respiratoy specimens during the acute phase of infection. The lowest concentration of SARS-CoV-2 viral copies this assay can detect is 131 copies/mL. A negative result does not preclude SARS-Cov-2 infection and should not be used as the sole basis for treatment or other patient management decisions. A negative result may occur with  improper specimen collection/handling, submission of specimen other than nasopharyngeal swab, presence of viral mutation(s) within the areas  targeted by this assay, and inadequate number of viral copies (<131 copies/mL). A negative result must be combined with clinical observations, patient history, and epidemiological information. The expected result is Negative.  Fact Sheet for Patients:  https://www.moore.com/  Fact Sheet for Healthcare Providers:  https://www.young.biz/  This test is no t yet approved or cleared by the [place] FDA and  has been authorized for detection and/or diagnosis of SARS-CoV-2 by FDA under an Emergency Use Authorization (EUA). This EUA will remain  in effect (meaning this test can be used) for the duration of the COVID-19 declaration under Section 564(b)(1) of the Act, 21 U.S.C. section 360bbb-3(b)(1), unless the authorization is terminated or revoked sooner.     Influenza A by PCR NEGATIVE NEGATIVE Final   Influenza B by PCR NEGATIVE NEGATIVE Final    Comment: (NOTE) The Xpert Xpress SARS-CoV-2/FLU/RSV assay is intended as an aid in  the diagnosis of influenza from Nasopharyngeal swab specimens and  should not be used as a sole basis for treatment. Nasal washings and  aspirates are unacceptable for Xpert Xpress SARS-CoV-2/FLU/RSV  testing.  Fact Sheet for Patients: https://www.moore.com/  Fact Sheet for Healthcare Providers: https://www.young.biz/  This test is not yet approved or cleared by the [place] FDA and  has been authorized for detection and/or diagnosis of SARS-CoV-2 by  FDA under an Emergency Use Authorization (EUA). This EUA will remain  in effect (meaning this test can be used) for the duration of the  Covid-19 declaration under Section 564(b)(1) of the Act, 21  U.S.C. section 360bbb-3(b)(1), unless the authorization is  terminated or revoked. Performed at [facility], [address] [place], [place] [address]    Lipid Panel:     Component Value Date/Time   CHOL 186  09/24/2020 0238   TRIG 62 09/24/2020 0238   HDL 44 09/24/2020 0238   CHOLHDL 4.2 09/24/2020 0238   VLDL 12 09/24/2020 0238   LDLCALC 130 (H) 09/24/2020 0238   HgbA1c:  Lab Results  Component Value Date   HGBA1C 4.8 09/24/2020   Urine Drug Screen:     Component Value Date/Time   LABOPIA POSITIVE (A) 10/10/2020 1858   COCAINSCRNUR NONE DETECTED 10/10/2020 1858   LABBENZ NONE DETECTED 10/10/2020 1858   AMPHETMU NONE DETECTED 10/10/2020 1858   THCU POSITIVE (A) 10/10/2020 1858   LABBARB NONE DETECTED 10/10/2020 1858    Alcohol Level: No results for input(s): ETH in the last 168 hours.  DG Chest 2 View  Result Date: 10/10/2020 CLINICAL DATA:  Chest pain and headache 4 days. EXAM: CHEST - 2 VIEW COMPARISON:  09/25/2020 and CT 09/23/2020 FINDINGS: Lungs are adequately inflated without focal airspace consolidation or effusion. Calcified granuloma over the right middle lobe. Cardiomediastinal silhouette and remainder of the exam is unchanged. IMPRESSION: No active cardiopulmonary disease. Electronically Signed   By: Elberta Fortis M.D.   On: 10/10/2020 14:30   CT  Head Wo Contrast  Result Date: 10/10/2020 CLINICAL DATA:  Headache, recent stroke EXAM: CT HEAD WITHOUT CONTRAST TECHNIQUE: Contiguous axial images were obtained from the base of the skull through the vertex without intravenous contrast. COMPARISON:  09/25/2020 FINDINGS: Brain: New hypoattenuation along the ventral body of the corpus callosum eccentric to the left. Evolving recent infarcts of the posterior left MCA territory and right corona radiata. Chronic infarcts of the left corona radiata and right thalamus. No acute intracranial hemorrhage or significant mass effect. No hydrocephalus or extra-axial collection. Vascular: No hyperdense vessel. There is atherosclerotic calcification at the skull base. Skull: Calvarium is unremarkable. Sinuses/Orbits: No acute finding. Other: None. IMPRESSION: New hypoattenuation along the ventral  body of the corpus callosum eccentric to the left suspicious for acute or subacute infarction. Evolving recent infarcts of the posterior left MCA territory and right corona radiata. No acute intracranial hemorrhage. These results were called by telephone at the time of interpretation on 10/10/2020 at 4:47 pm to provider Trudee Grip, who verbally acknowledged these results. Electronically Signed   By: Guadlupe Spanish M.D.   On: 10/10/2020 16:47   CT HEAD WO CONTRAST  Result Date: 09/25/2020 CLINICAL DATA:  Stroke.  Worsening headache. EXAM: CT HEAD WITHOUT CONTRAST TECHNIQUE: Contiguous axial images were obtained from the base of the skull through the vertex without intravenous contrast. COMPARISON:  CT head 09/24/2020 FINDINGS: Brain: Hypodensity in the left temporoparietal lobe unchanged. No acute hemorrhage identified. Subtle hypodensity right corona radiata may represent a recent infarct. Small hypodensity in the left posterior insula and external capsule unchanged consistent with chronic infarct. Chronic infarct right thalamus unchanged. Ventricle size normal without midline shift. No acute hemorrhage. Vascular: Negative for hyperdense vessel Skull: Negative Sinuses/Orbits: Mild mucosal edema left maxillary sinus. Negative orbit Other: None IMPRESSION: Acute infarct left temporoparietal lobe unchanged. No associated hemorrhage. No change from the prior CT. Electronically Signed   By: Marlan Palau M.D.   On: 09/25/2020 17:08   CT HEAD WO CONTRAST  Result Date: 09/24/2020 CLINICAL DATA:  Severe headache.  History of stroke. EXAM: CT HEAD WITHOUT CONTRAST TECHNIQUE: Contiguous axial images were obtained from the base of the skull through the vertex without intravenous contrast. COMPARISON:  Yesterday FINDINGS: Brain: More well-defined cytotoxic edema in the left frontal parietal convexity, expected. Subacute infarct at the right corona radiata is unchanged. Chronic lacunar infarct at the right thalamus  and left corona radiata. No acute hemorrhage, hydrocephalus, or significant mass effect. Vascular: No hyperdense vessel or unexpected calcification. Skull: Remote nasal bone fractures. Sinuses/Orbits: No acute finding. IMPRESSION: 1. No new abnormality. 2. Acute left MCA branch infarct. 3. Subacute right corona radiata infarct. 4. Chronic lacunar infarcts. Electronically Signed   By: Marnee Spring M.D.   On: 09/24/2020 06:07   CT HEAD WO CONTRAST  Result Date: 09/23/2020 CLINICAL DATA:  Neuro deficit, acute stroke suspected. EXAM: CT HEAD WITHOUT CONTRAST TECHNIQUE: Contiguous axial images were obtained from the base of the skull through the vertex without intravenous contrast. COMPARISON:  Noncontrast head CT 09/20/2020. CT angiogram head/neck 09/20/2020. Noncontrast head CT 09/21/2020. Brain MRI 09/22/2020. FINDINGS: Brain: Cerebral volume is normal. A moderate to large acute/early subacute cortical and subcortical infarct within the left parietal lobe, posterosuperior left temporal lobe and posterior left insula has not significantly changed in extent as compared to the brain MRI performed one day prior. Subtle petechial hemorrhage is questioned, but there is no frank hemorrhagic conversion. Regional mass effect. No midline shift A small subacute infarct within  the right corona radiata was better appreciated on the prior MRI. Redemonstrated chronic lacunar infarcts within the bilateral cerebral white matter, left basal ganglia and right thalamus. No extra-axial fluid collection. No evidence of intracranial mass. No midline shift. Vascular: Atherosclerotic calcifications. Skull: Normal. Negative for fracture or focal lesion. Sinuses/Orbits: Visualized orbits show no acute finding. Mild paranasal sinus mucosal thickening, most notably ethmoidal. Other: There are acute, comminuted and displaced bilateral nasal bone fractures, new as compared to prior examinations. Partial opacification of the right nasal  passage. Suspected additional minimally displaced acute fractures of the bony nasal septum (for instance as seen on series 4, images 16 and 17). IMPRESSION: A moderate to large acute/early subacute cortical and subcortical infarct within the left MCA vascular territory has not significantly changed in extent as compared to the brain MRI performed one day prior. There is regional mass effect but no midline shift. Subtle petechial hemorrhage is questioned, but there is no frank hemorrhagic conversion. A small subacute infarct within the right corona radiata was better appreciated on the prior MRI. Redemonstrated chronic lacunar infarcts within the bilateral cerebral white matter, left basal ganglia and right thalamus. Acute, comminuted and displaced bilateral nasal bone fractures which are new as compared to the prior examinations. Suspected minimally displaced acute fractures of the bony nasal septum. Partial opacification of the right nasal passage. Consider dedicated maxillofacial CT for further evaluation. Electronically Signed   By: Jackey Loge DO   On: 09/23/2020 19:23   MR ANGIO HEAD WO CONTRAST  Result Date: 10/10/2020 CLINICAL DATA:  Initial evaluation for neuro deficit, stroke suspected. EXAM: MRI HEAD WITHOUT CONTRAST MRA HEAD WITHOUT CONTRAST MRA NECK WITHOUT CONTRAST TECHNIQUE: Multiplanar, multiecho pulse sequences of the brain and surrounding structures were obtained without intravenous contrast. Angiographic images of the Circle of Willis were obtained using MRA technique without intravenous contrast. Angiographic images of the neck were obtained using MRA technique without intravenous contrast. Carotid stenosis measurements (when applicable) are obtained utilizing NASCET criteria, using the distal internal carotid diameter as the denominator. COMPARISON:  Prior CT from earlier same day as well as earlier studies. FINDINGS: MRI HEAD FINDINGS Brain: Cerebral volume within normal limits for age.  Multiple scattered remote lacunar infarcts noted about the left basal ganglia/corona radiata and right thalamus. Perforator type lacunar infarct involving the posterior right basal ganglia/corona radiata demonstrates mild residual diffusion abnormality without ADC correlate, consistent with a late subacute ischemic infarct (series 5, image 83). No associated hemorrhage or mass effect. Confluent area of restricted diffusion involving the body of the left corpus callosum measures 4.3 x 1.7 cm, consistent with an acute left ACA territory infarct (series 5, image 83). Mild diffusion abnormality/ischemia noted within the contralateral right corpus callosum as well (series 6, image 34). No associated hemorrhage or mass effect. Patchy and confluent restricted diffusion involving the left parieto-occipital region is seen (series 5, image 84). Associated signal loss seen on ADC map (series 6, image 34), consistent with an acute left MCA distribution infarct. 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 Cortically based T1 hyperintensity and susceptibility artifact consistent with laminar necrosis and/or petechial hemorrhage. No significant regional mass effect. Additional punctate acute ischemic nonhemorrhagic infarct noted anteriorly within the left frontal centrum semi ovale (series 5, image 90). No other evidence for acute or subacute ischemia. Gray-white matter differentiation otherwise maintained. No mass lesion or midline shift. No hydrocephalus. Subtle 2 mm FLAIR hyperintensity with associated susceptibility artifacts seen overlying the left  frontal convexity, consistent with a small subdural hematoma, likely subacute in nature (series 9, image 16). No associated mass effect. Additional faint component noted posteriorly at the left parietal convexity. Pituitary gland suprasellar region normal. Vascular: Major intracranial vascular flow voids are maintained. Skull  and upper cervical spine: Craniocervical junction normal. Bone marrow signal intensity within normal limits. No visible scalp soft tissue injury. Sinuses/Orbits: Globes and orbital soft tissues grossly within normal limits. Scattered mucosal thickening noted within the ethmoidal air cells and maxillary sinuses. Paranasal sinuses are otherwise clear. No significant mastoid effusion. Inner ear structures normal. Other: None. MRA HEAD FINDINGS ANTERIOR CIRCULATION: Examination mildly degraded by motion artifact. Visualized distal cervical segments of the internal carotid arteries are patent with antegrade flow. Petrous segments patent bilaterally. Atheromatous irregularity within the cavernous/supraclinoid segments without high-grade stenosis. A1 segments patent bilaterally. Normal anterior communicating artery complex. There are severe near occlusive stenoses involving the proximal A2 segments bilaterally (series 18, image 114 on the right, series 18, image 117 on the left). Additional severe downstream 8 3 stenosis (series 18, image 152). ACAs remain perfused to their distal aspects at this time. No M1 stenosis or occlusion. Negative MCA bifurcations. Distal right MCA branches remain perfused, although demonstrate advanced small vessel atheromatous irregularity for age. On the left, there is focal signal loss within a proximal left M2 branch, inferior division (series 18, image 109). Finding could reflect severe high-grade stenosis or possibly subocclusive thrombus. Left MCA branches otherwise perfused, although demonstrate advanced age-related small vessel atheromatous irregularity. POSTERIOR CIRCULATION: Visualized vertebral arteries patent to the vertebrobasilar junction without stenosis. Left PICA patent proximally. Basilar patent to its distal aspect without stenosis. Superior cerebral arteries patent bilaterally. Predominant fetal type origin of the PCAs bilaterally. There is a severe near occlusive stenosis  involving the mid left P2 segment (series 26, image 8). PCAs otherwise patent without proximal high-grade stenosis, although demonstrate extensive distal small vessel atheromatous irregularity. MRA NECK FINDINGS AORTIC ARCH: Examination somewhat limited by lack of IV contrast. Visualized aortic arch of normal caliber with normal branch pattern. No hemodynamically significant stenosis seen about the origin of the great vessels. RIGHT CAROTID SYSTEM: Right CCA patent from its origin to the bifurcation without stenosis. Mild atheromatous narrowing of no more than 20% at the origin of the right ICA at the bifurcation. Right ICA patent distally to the skull base without stenosis, evidence for dissection, or occlusion. LEFT CAROTID SYSTEM: Left CCA patent from its origin to the bifurcation without stenosis. Atheromatous narrowing of up to approximately 40% at the origin of the left ICA (series 38, image 5). Left ICA otherwise patent to the skull base without stenosis, evidence for dissection or occlusion. VERTEBRAL ARTERIES: Both vertebral arteries arise from the subclavian arteries. Vertebral arteries diminutive, although the right is slightly dominant. Vertebral arteries patent within the neck without appreciable stenosis, evidence for dissection or occlusion. IMPRESSION: MRI HEAD IMPRESSION: 1. 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 Additional patchy acute ischemic infarcts involving the left parieto-occipital region, left MCA distribution. This is superimposed on an underlying late subacute left MCA infarct at this location. Evidence for associated laminar necrosis and/or petechial hemorrhage. 3. 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 4. Underlying multiple remote lacunar infarcts involving the left basal ganglia/corona radiata and right thalamus. 5. Tiny 2 mm subdural  hematoma overlying the left frontal convexity as above, likely subacute in nature. No associated  mass effect. MRA HEAD IMPRESSION: 1. Focal signal loss within a proximal left M2 branch, inferior division. Finding could reflect severe high-grade stenosis or possibly subocclusive thrombus. 2. 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 3. Severe mid left P2 stenosis. There is predominant fetal type origin of the PCAs bilaterally. 4. Extensive small vessel atheromatous irregularity throughout the intracranial circulation, advanced for age. MRA NECK IMPRESSION: 1. Mild atheromatous narrowing about the origins of both ICAs, measuring up to 40% on the left and 20% on the right. 2. Diminutive but patent vertebral arteries within the neck. Right vertebral artery slightly dominant. Case discussed by telephone at the time of interpretation on 10/10/2020 at 9:14 pm to provider [facility] AROR[facility], who verbally acknowledged these results. Electronically Signed   By: Rise MuBenjamin  McClintock M.D.   On: 10/10/2020 21:16   MR ANGIO NECK WO CONTRAST  Result Date: 10/10/2020 CLINICAL DATA:  Initial evaluation for neuro deficit, stroke suspected. EXAM: MRI HEAD WITHOUT CONTRAST MRA HEAD WITHOUT CONTRAST MRA NECK WITHOUT CONTRAST TECHNIQUE: Multiplanar, multiecho pulse sequences of the brain and surrounding structures were obtained without intravenous contrast. Angiographic images of the Circle of Willis were obtained using MRA technique without intravenous contrast. Angiographic images of the neck were obtained using MRA technique without intravenous contrast. Carotid stenosis measurements (when applicable) are obtained utilizing NASCET criteria, using the distal internal carotid diameter as the denominator. COMPARISON:  Prior CT from earlier same day as well as earlier studies. FINDINGS: MRI HEAD FINDINGS Brain: Cerebral volume within normal limits for age. Multiple scattered remote lacunar  infarcts noted about the left basal ganglia/corona radiata and right thalamus. Perforator type lacunar infarct involving the posterior right basal ganglia/corona radiata demonstrates mild residual diffusion abnormality without ADC correlate, consistent with a late subacute ischemic infarct (series 5, image 83). No associated hemorrhage or mass effect. Confluent area of restricted diffusion involving the body of the left corpus callosum measures 4.3 x 1.7 cm, consistent with an acute left ACA territory infarct (series 5, image 83). Mild diffusion abnormality/ischemia noted within the contralateral right corpus callosum as well (series 6, image 34). No associated hemorrhage or mass effect. Patchy and confluent restricted diffusion involving the left parieto-occipital region is seen (series 5, image 84). Associated signal loss seen on ADC map (series 6, image 34), consistent with an acute left MCA distribution infarct. 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 Cortically based T1 hyperintensity and susceptibility artifact consistent with laminar necrosis and/or petechial hemorrhage. No significant regional mass effect. Additional punctate acute ischemic nonhemorrhagic infarct noted anteriorly within the left frontal centrum semi ovale (series 5, image 90). No other evidence for acute or subacute ischemia. Gray-white matter differentiation otherwise maintained. No mass lesion or midline shift. No hydrocephalus. Subtle 2 mm FLAIR hyperintensity with associated susceptibility artifacts seen overlying the left frontal convexity, consistent with a small subdural hematoma, likely subacute in nature (series 9, image 16). No associated mass effect. Additional faint component noted posteriorly at the left parietal convexity. Pituitary gland suprasellar region normal. Vascular: Major intracranial vascular flow voids are maintained. Skull and upper cervical spine:  Craniocervical junction normal. Bone marrow signal intensity within normal limits. No visible scalp soft tissue injury. Sinuses/Orbits: Globes and orbital soft tissues grossly within normal limits. Scattered mucosal thickening noted within the ethmoidal air cells and maxillary sinuses. Paranasal sinuses are otherwise clear. No significant mastoid effusion. Inner ear structures normal. Other: None. MRA HEAD FINDINGS ANTERIOR  CIRCULATION: Examination mildly degraded by motion artifact. Visualized distal cervical segments of the internal carotid arteries are patent with antegrade flow. Petrous segments patent bilaterally. Atheromatous irregularity within the cavernous/supraclinoid segments without high-grade stenosis. A1 segments patent bilaterally. Normal anterior communicating artery complex. There are severe near occlusive stenoses involving the proximal A2 segments bilaterally (series 18, image 114 on the right, series 18, image 117 on the left). Additional severe downstream 8 3 stenosis (series 18, image 152). ACAs remain perfused to their distal aspects at this time. No M1 stenosis or occlusion. Negative MCA bifurcations. Distal right MCA branches remain perfused, although demonstrate advanced small vessel atheromatous irregularity for age. On the left, there is focal signal loss within a proximal left M2 branch, inferior division (series 18, image 109). Finding could reflect severe high-grade stenosis or possibly subocclusive thrombus. Left MCA branches otherwise perfused, although demonstrate advanced age-related small vessel atheromatous irregularity. POSTERIOR CIRCULATION: Visualized vertebral arteries patent to the vertebrobasilar junction without stenosis. Left PICA patent proximally. Basilar patent to its distal aspect without stenosis. Superior cerebral arteries patent bilaterally. Predominant fetal type origin of the PCAs bilaterally. There is a severe near occlusive stenosis involving the mid left P2  segment (series 26, image 8). PCAs otherwise patent without proximal high-grade stenosis, although demonstrate extensive distal small vessel atheromatous irregularity. MRA NECK FINDINGS AORTIC ARCH: Examination somewhat limited by lack of IV contrast. Visualized aortic arch of normal caliber with normal branch pattern. No hemodynamically significant stenosis seen about the origin of the great vessels. RIGHT CAROTID SYSTEM: Right CCA patent from its origin to the bifurcation without stenosis. Mild atheromatous narrowing of no more than 20% at the origin of the right ICA at the bifurcation. Right ICA patent distally to the skull base without stenosis, evidence for dissection, or occlusion. LEFT CAROTID SYSTEM: Left CCA patent from its origin to the bifurcation without stenosis. Atheromatous narrowing of up to approximately 40% at the origin of the left ICA (series 38, image 5). Left ICA otherwise patent to the skull base without stenosis, evidence for dissection or occlusion. VERTEBRAL ARTERIES: Both vertebral arteries arise from the subclavian arteries. Vertebral arteries diminutive, although the right is slightly dominant. Vertebral arteries patent within the neck without appreciable stenosis, evidence for dissection or occlusion. IMPRESSION: MRI HEAD IMPRESSION: 1. 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 Additional patchy acute ischemic infarcts involving the left parieto-occipital region, left MCA distribution. This is superimposed on an underlying late subacute left MCA infarct at this location. Evidence for associated laminar necrosis and/or petechial hemorrhage. 3. 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 4. Underlying multiple remote lacunar infarcts involving the left basal ganglia/corona radiata and right thalamus. 5. Tiny 2 mm subdural hematoma overlying the left  frontal convexity as above, likely subacute in nature. No associated mass effect. MRA HEAD IMPRESSION: 1. Focal signal loss within a proximal left M2 branch, inferior division. Finding could reflect severe high-grade stenosis or possibly subocclusive thrombus. 2. 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 3. Severe mid left P2 stenosis. There is predominant fetal type origin of the PCAs bilaterally. 4. Extensive small vessel atheromatous irregularity throughout the intracranial circulation, advanced for age. MRA NECK IMPRESSION: 1. Mild atheromatous narrowing about the origins of both ICAs, measuring up to 40% on the left and 20% on the right. 2. Diminutive but patent vertebral arteries within the neck. Right  vertebral artery slightly dominant. Case discussed by telephone at the time of interpretation on 10/10/2020 at 9:14 pm to provider [facility], who verbally acknowledged these results. Electronically Signed   By: Rise Mu M.D.   On: 10/10/2020 21:16   MR BRAIN WO CONTRAST  Result Date: 10/10/2020 CLINICAL DATA:  Initial evaluation for neuro deficit, stroke suspected. EXAM: MRI HEAD WITHOUT CONTRAST MRA HEAD WITHOUT CONTRAST MRA NECK WITHOUT CONTRAST TECHNIQUE: Multiplanar, multiecho pulse sequences of the brain and surrounding structures were obtained without intravenous contrast. Angiographic images of the Circle of Willis were obtained using MRA technique without intravenous contrast. Angiographic images of the neck were obtained using MRA technique without intravenous contrast. Carotid stenosis measurements (when applicable) are obtained utilizing NASCET criteria, using the distal internal carotid diameter as the denominator. COMPARISON:  Prior CT from earlier same day as well as earlier studies. FINDINGS: MRI HEAD FINDINGS Brain: Cerebral volume within normal limits for age. Multiple scattered remote lacunar infarcts noted about the left basal  ganglia/corona radiata and right thalamus. Perforator type lacunar infarct involving the posterior right basal ganglia/corona radiata demonstrates mild residual diffusion abnormality without ADC correlate, consistent with a late subacute ischemic infarct (series 5, image 83). No associated hemorrhage or mass effect. Confluent area of restricted diffusion involving the body of the left corpus callosum measures 4.3 x 1.7 cm, consistent with an acute left ACA territory infarct (series 5, image 83). Mild diffusion abnormality/ischemia noted within the contralateral right corpus callosum as well (series 6, image 34). No associated hemorrhage or mass effect. Patchy and confluent restricted diffusion involving the left parieto-occipital region is seen (series 5, image 84). Associated signal loss seen on ADC map (series 6, image 34), consistent with an acute left MCA distribution infarct. 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 Cortically based T1 hyperintensity and susceptibility artifact consistent with laminar necrosis and/or petechial hemorrhage. No significant regional mass effect. Additional punctate acute ischemic nonhemorrhagic infarct noted anteriorly within the left frontal centrum semi ovale (series 5, image 90). No other evidence for acute or subacute ischemia. Gray-white matter differentiation otherwise maintained. No mass lesion or midline shift. No hydrocephalus. Subtle 2 mm FLAIR hyperintensity with associated susceptibility artifacts seen overlying the left frontal convexity, consistent with a small subdural hematoma, likely subacute in nature (series 9, image 16). No associated mass effect. Additional faint component noted posteriorly at the left parietal convexity. Pituitary gland suprasellar region normal. Vascular: Major intracranial vascular flow voids are maintained. Skull and upper cervical spine: Craniocervical junction normal. Bone marrow  signal intensity within normal limits. No visible scalp soft tissue injury. Sinuses/Orbits: Globes and orbital soft tissues grossly within normal limits. Scattered mucosal thickening noted within the ethmoidal air cells and maxillary sinuses. Paranasal sinuses are otherwise clear. No significant mastoid effusion. Inner ear structures normal. Other: None. MRA HEAD FINDINGS ANTERIOR CIRCULATION: Examination mildly degraded by motion artifact. Visualized distal cervical segments of the internal carotid arteries are patent with antegrade flow. Petrous segments patent bilaterally. Atheromatous irregularity within the cavernous/supraclinoid segments without high-grade stenosis. A1 segments patent bilaterally. Normal anterior communicating artery complex. There are severe near occlusive stenoses involving the proximal A2 segments bilaterally (series 18, image 114 on the right, series 18, image 117 on the left). Additional severe downstream 8 3 stenosis (series 18, image 152). ACAs remain perfused to their distal aspects at this time. No M1 stenosis or occlusion. Negative MCA bifurcations. Distal right MCA branches remain perfused, although demonstrate advanced small  vessel atheromatous irregularity for age. On the left, there is focal signal loss within a proximal left M2 branch, inferior division (series 18, image 109). Finding could reflect severe high-grade stenosis or possibly subocclusive thrombus. Left MCA branches otherwise perfused, although demonstrate advanced age-related small vessel atheromatous irregularity. POSTERIOR CIRCULATION: Visualized vertebral arteries patent to the vertebrobasilar junction without stenosis. Left PICA patent proximally. Basilar patent to its distal aspect without stenosis. Superior cerebral arteries patent bilaterally. Predominant fetal type origin of the PCAs bilaterally. There is a severe near occlusive stenosis involving the mid left P2 segment (series 26, image 8). PCAs otherwise  patent without proximal high-grade stenosis, although demonstrate extensive distal small vessel atheromatous irregularity. MRA NECK FINDINGS AORTIC ARCH: Examination somewhat limited by lack of IV contrast. Visualized aortic arch of normal caliber with normal branch pattern. No hemodynamically significant stenosis seen about the origin of the great vessels. RIGHT CAROTID SYSTEM: Right CCA patent from its origin to the bifurcation without stenosis. Mild atheromatous narrowing of no more than 20% at the origin of the right ICA at the bifurcation. Right ICA patent distally to the skull base without stenosis, evidence for dissection, or occlusion. LEFT CAROTID SYSTEM: Left CCA patent from its origin to the bifurcation without stenosis. Atheromatous narrowing of up to approximately 40% at the origin of the left ICA (series 38, image 5). Left ICA otherwise patent to the skull base without stenosis, evidence for dissection or occlusion. VERTEBRAL ARTERIES: Both vertebral arteries arise from the subclavian arteries. Vertebral arteries diminutive, although the right is slightly dominant. Vertebral arteries patent within the neck without appreciable stenosis, evidence for dissection or occlusion. IMPRESSION: MRI HEAD IMPRESSION: 1. 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 Additional patchy acute ischemic infarcts involving the left parieto-occipital region, left MCA distribution. This is superimposed on an underlying late subacute left MCA infarct at this location. Evidence for associated laminar necrosis and/or petechial hemorrhage. 3. 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 4. Underlying multiple remote lacunar infarcts involving the left basal ganglia/corona radiata and right thalamus. 5. Tiny 2 mm subdural hematoma overlying the left frontal convexity as above, likely subacute  in nature. No associated mass effect. MRA HEAD IMPRESSION: 1. Focal signal loss within a proximal left M2 branch, inferior division. Finding could reflect severe high-grade stenosis or possibly subocclusive thrombus. 2. 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 3. Severe mid left P2 stenosis. There is predominant fetal type origin of the PCAs bilaterally. 4. Extensive small vessel atheromatous irregularity throughout the intracranial circulation, advanced for age. MRA NECK IMPRESSION: 1. Mild atheromatous narrowing about the origins of both ICAs, measuring up to 40% on the left and 20% on the right. 2. Diminutive but patent vertebral arteries within the neck. Right vertebral artery slightly dominant. Case discussed by telephone at the time of interpretation on 10/10/2020 at 9:14 pm to provider [facility], who verbally acknowledged these results. Electronically Signed   By: Rise Mu M.D.   On: 10/10/2020 21:16   DG Chest Port 1 View  Result Date: 09/25/2020 CLINICAL DATA:  Dyspnea EXAM: PORTABLE CHEST 1 VIEW COMPARISON:  09/23/2020 chest radiograph. FINDINGS: Stable cardiomediastinal silhouette with normal heart size. No pneumothorax. No pleural effusion. Lungs appear clear, with no acute consolidative airspace disease and no pulmonary edema. IMPRESSION: No active disease. Electronically Signed   By: Jannifer Rodney.D.  On: 09/25/2020 07:59   DG Chest Portable 1 View  Result Date: 09/23/2020 CLINICAL DATA:  Chest pain EXAM: PORTABLE CHEST 1 VIEW COMPARISON:  September 21, 2020 FINDINGS: The heart size and mediastinal contours are within normal limits. Both lungs are clear. The visualized skeletal structures are unremarkable. IMPRESSION: No active disease. Electronically Signed   By: Jonna Clark M.D.   On: 09/23/2020 16:49   VAS [place] TRANSCRANIAL DOPPLER W BUBBLES  Result Date: 09/27/2020  Transcranial Doppler with Bubble Indications: Stroke.  Comparison Study: No prior study Performing Technologist: Gertie Fey MHA, RDMS, RVT, RDCS  Examination Guidelines: A complete evaluation includes B-mode imaging, spectral Doppler, color Doppler, and power Doppler as needed of all accessible portions of each vessel. Bilateral testing is considered an integral part of a complete examination. Limited examinations for reoccurring indications may be performed as noted.  Summary: No HITS at rest or during Valsalva. Negative transcranial Doppler Bubble study with no evidence of right to left intracardiac communication.  A vascular evaluation was performed. The right middle cerebral artery was studied. An IV was inserted into the patient's right forearm. Verbal informed consent was obtained.  NEGATIVE TCD Buuble study *See table(s) above for TCD measurements and observations.  Diagnosing physician: Delia Heady MD Electronically signed by Delia Heady MD on 09/27/2020 at 1:58:44 PM.    Final    ECHOCARDIOGRAM COMPLETE  Result Date: 09/24/2020    ECHOCARDIOGRAM REPORT   Patient Name:   TREBOR GALDAMEZ Date of Exam: 09/24/2020 Medical Rec #:  [id number]    Height:       74.0 in Accession #:    [id number]   Weight:       159.3 lb Date of Birth:  1976-10-11   BSA:          1.973 m Patient Age:    44 years     BP:           155/87 mmHg Patient Gender: M            HR:           53 bpm. Exam Location:  Inpatient Procedure: 2D Echo, Color Doppler, Cardiac Doppler and Strain Analysis Indications:    Stroke i163.9  History:        Patient has prior history of Echocardiogram examinations, most                 recent 12/21/2017. Risk Factors:Hypertension.  Sonographer:    Irving Burton Senior RDCS Referring Phys: [id number] Scheryl Marten XU IMPRESSIONS  1. Left ventricular ejection fraction, by estimation, is 55 to 60%. The left ventricle has normal function. The left ventricle has no regional wall motion abnormalities. There is severe left ventricular hypertrophy. Left ventricular diastolic  parameters  are indeterminate.  2. Right ventricular systolic function is normal. The right ventricular size is normal.  3. Left atrial size was mild to moderately dilated.  4. Mild mitral valve regurgitation.  5. 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 Aortic valve regurgitation is mild to moderate. Mild aortic valve sclerosis is present, with no evidence of aortic valve stenosis.  6. The inferior vena cava is normal in size with greater than 50% respiratory variability, suggesting right atrial pressure of 3 mmHg. FINDINGS  Left Ventricle: Left ventricular ejection fraction, by estimation, is 55 to 60%. The left ventricle has normal function. The left ventricle has no regional wall motion abnormalities. The left ventricular internal cavity size was normal in size.  There is  severe left ventricular hypertrophy. Left ventricular diastolic parameters are indeterminate. Right Ventricle: The right ventricular size is normal. Right vetricular wall thickness was not assessed. Right ventricular systolic function is normal. Left Atrium: Left atrial size was mild to moderately dilated. Right Atrium: Right atrial size was normal in size. Pericardium: There is no evidence of pericardial effusion. Mitral Valve: The mitral valve is abnormal. There is mild thickening of the mitral valve leaflet(s). Mild mitral annular calcification. Mild mitral valve regurgitation. Tricuspid Valve: The tricuspid valve is normal in structure. Tricuspid valve regurgitation is trivial. 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 The aortic valve is abnormal. Aortic valve regurgitation is mild to moderate. Aortic regurgitation PHT measures 897 msec. Mild aortic valve sclerosis is present, with no evidence of aortic valve stenosis. Pulmonic Valve: The pulmonic valve was grossly normal. Pulmonic valve regurgitation is not visualized. Aorta: The aortic root and  ascending aorta are structurally normal, with no evidence of dilitation. Venous: The inferior vena cava is normal in size with greater than 50% respiratory variability, suggesting right atrial pressure of 3 mmHg. IAS/Shunts: No atrial level shunt detected by color flow Doppler.  LEFT VENTRICLE PLAX 2D LVIDd:         4.30 cm  Diastology LVIDs:         2.90 cm  LV e' medial:    5.22 cm/s LV PW:         1.50 cm  LV E/e' medial:  11.9 LV IVS:        1.80 cm  LV e' lateral:   5.11 cm/s LVOT diam:     2.10 cm  LV E/e' lateral: 12.2 LV SV:         87 LV SV Index:   44 LVOT Area:     3.46 cm  RIGHT VENTRICLE RV S prime:     11.00 cm/s TAPSE (M-mode): 2.3 cm LEFT ATRIUM              Index       RIGHT ATRIUM           Index LA diam:        4.50 cm  2.28 cm/m  RA Area:     14.10 cm LA Vol (A2C):   112.0 ml 56.76 ml/m RA Volume:   29.70 ml  15.05 ml/m LA Vol (A4C):   76.1 ml  38.57 ml/m LA Biplane Vol: 92.1 ml  46.68 ml/m  AORTIC VALVE LVOT Vmax:   111.00 cm/s LVOT Vmean:  71.700 cm/s LVOT VTI:    0.252 m AI PHT:      897 msec  AORTA Ao Root diam: 3.70 cm Ao Asc diam:  3.50 cm MITRAL VALVE MV Area (PHT): 2.62 cm    SHUNTS MV Decel Time: 290 msec    Systemic VTI:  0.25 m MV E velocity: 62.20 cm/s  Systemic Diam: 2.10 cm MV A velocity: 48.00 cm/s MV E/A ratio:  1.30 Dietrich Pates MD Electronically signed by Dietrich Pates MD Signature Date/Time: 09/24/2020/3:44:05 PM    Final    ECHO TEE  Result Date: 09/28/2020    TRANSESOPHOGEAL ECHO REPORT   Patient Name:   ISSAI WERLING Date of Exam: 09/28/2020 Medical Rec #:  [id number]    Height:       74.0 in Accession #:    [id number]   Weight:       156.5 lb Date of Birth:  Jul 08, 1976   BSA:  1.958 m Patient Age:    44 years     BP:           159/80 mmHg Patient Gender: M            HR:           69 bpm. Exam Location:  Inpatient Procedure: Transesophageal Echo Indications:     Stroke  History:         Patient has prior history of Echocardiogram examinations, most                   recent 09/24/2020. Pulmonary HTN; Risk Factors:Hypertension.                  CKD. HFpEF.  Sonographer:     Ross Ludwig RDCS (AE) Referring Phys:  [id number] Corrin Parker Diagnosing Phys: Armanda Magic MD PROCEDURE: After discussion of the risks and benefits of a TEE, an informed consent was obtained from the patient. The transesophogeal probe was passed without difficulty through the esophogus of the patient. Local oropharyngeal anesthetic was provided with Cetacaine. Sedation performed by different physician. The patient was monitored while under deep sedation. Anesthestetic sedation was provided intravenously by Anesthesiology: 344.65mg  of Propofol. Image quality was adequate. The patient's vital signs; including heart rate, blood pressure, and oxygen saturation; remained stable throughout the procedure. The patient developed no complications during the procedure. IMPRESSIONS  1. Left ventricular ejection fraction, by estimation, is 55 to 60%. The left ventricle has normal function. The left ventricle has no regional wall motion abnormalities. Left ventricular diastolic function could not be evaluated.  2. Right ventricular systolic function is normal. The right ventricular size is normal.  3. Left atrial size was mild to moderately dilated. No left atrial/left atrial appendage thrombus was detected.  4. The mitral valve is normal in structure. Mild mitral valve regurgitation. No evidence of mitral stenosis.  5. By PHT there is mild aortic insufficiency, but visually there appears to be moderate AI eccentrically directed in the LVOT towards the anterior MV leaflet. The aortic valve is tricuspid. Mild aortic valve sclerosis is present, with no evidence of aortic valve stenosis.  6. The inferior vena cava is normal in size with greater than 50% respiratory variability, suggesting right atrial pressure of 3 mmHg.  7. Agitated saline contrast bubble study was negative, with no evidence of any interatrial shunt. No  LA/LAA thrombus identified. Negative bubble study for interatrial shunt. No intracardiac source of embolism detected on this on this transesophageal echocardiogram. FINDINGS  Left Ventricle: Left ventricular ejection fraction, by estimation, is 55 to 60%. The left ventricle has normal function. The left ventricle has no regional wall motion abnormalities. The left ventricular internal cavity size was normal in size. There is  no left ventricular hypertrophy. Left ventricular diastolic function could not be evaluated. Right Ventricle: The right ventricular size is normal. No increase in right ventricular wall thickness. Right ventricular systolic function is normal. Left Atrium: Left atrial size was mild to moderately dilated. Spontaneous echo contrast was present in the left atrium. No left atrial/left atrial appendage thrombus was detected. Right Atrium: Right atrial size was normal in size. Pericardium: There is no evidence of pericardial effusion. Mitral Valve: The mitral valve is normal in structure. Mild mitral valve regurgitation. No evidence of mitral valve stenosis. Tricuspid Valve: The tricuspid valve is normal in structure. Tricuspid valve regurgitation is mild . No evidence of tricuspid stenosis. Aortic Valve: By PHT there is mild aortic insufficiency,  but visually there appears to be moderate AI eccentrically directed in the LVOT towards the anterior MV leaflet. The aortic valve is tricuspid. Aortic valve regurgitation is moderate. Aortic regurgitation PHT measures 606 msec. Mild aortic valve sclerosis is present, with no evidence of aortic valve stenosis. Pulmonic Valve: The pulmonic valve was normal in structure. Pulmonic valve regurgitation is trivial. No evidence of pulmonic stenosis. Aorta: Aortic root could not be assessed. Venous: The inferior vena cava is normal in size with greater than 50% respiratory variability, suggesting right atrial pressure of 3 mmHg. IAS/Shunts: No atrial level shunt  detected by color flow Doppler. Agitated saline contrast was given intravenously to evaluate for intracardiac shunting. Agitated saline contrast bubble study was negative, with no evidence of any interatrial shunt. There  is no evidence of a patent foramen ovale. There is no evidence of an atrial septal defect.  AORTIC VALVE AI PHT:      606 msec  AORTA Ao Asc diam: 3.40 cm Armanda Magic MD Electronically signed by Armanda Magic MD Signature Date/Time: 09/28/2020/2:15:14 PM    Final    CT Angio Chest/Abd/Pel for Dissection W and/or Wo Contrast  Result Date: 09/23/2020 CLINICAL DATA:  Abdominal pain, question aortic dissection EXAM: CT ANGIOGRAPHY CHEST, ABDOMEN AND PELVIS TECHNIQUE: Non-contrast CT of the chest was initially obtained. Multidetector CT imaging through the chest, abdomen and pelvis was performed using the standard protocol during bolus administration of intravenous contrast. Multiplanar reconstructed images and MIPs were obtained and reviewed to evaluate the vascular anatomy. CONTRAST:  OMNIPAQUE IOHEXOL 350 MG/ML SOLN COMPARISON:  None. FINDINGS: CTA CHEST FINDINGS Cardiovascular: --Heart: The heart size is normal.  There is nopericardial effusion. --Aorta: The course and caliber of the thoracic aorta are normal. There is no aortic atherosclerotic calcification. Precontrast images show no aortic intramural hematoma. There is no blood pool, dissection or penetrating ulcer demonstrated on arterial phase postcontrast imaging. There is a origin of the left carotid artery the brachiocephalic trunk. Minimal calcifications seen at the origin of brachiocephalic trunk. The proximal arch vessels are widely patent. --Pulmonary Arteries: Contrast timing is optimized for preferential opacification of the aorta. Within that limitation, normal central pulmonary arteries. Mediastinum/Nodes: No mediastinal, hilar or axillary lymphadenopathy. The visualized thyroid and thoracic esophageal course are  unremarkable. Lungs/Pleura: Mild ground-glass opacity seen at the posterior right lung base. There is a calcified granuloma seen in the right middle lobe. No focal airspace consolidation. No focal pleural abnormality. Musculoskeletal: No chest wall abnormality. No acute osseous findings. Review of the MIP images confirms the above findings. CTA ABDOMEN AND PELVIS FINDINGS VASCULAR Aorta: Normal caliber aorta without aneurysm, dissection, vasculitis or hemodynamically significant stenosis. There is scattered mild aortic atherosclerosis at the aorta bi-iliac bifurcation. Celiac: No aneurysm, dissection or hemodynamically significant stenosis. Normal branching pattern SMA: Widely patent without dissection or stenosis. Renals: Single renal arteries bilaterally. No aneurysm, dissection, stenosis or evidence of fibromuscular dysplasia. IMA: Patent without abnormality. Inflow: Scattered atherosclerosis at the common iliac vasculature. Veins: Normal course and caliber of the major veins. Assessment is otherwise limited by the arterial dominant contrast phase. Review of the MIP images confirms the above findings. NON-VASCULAR Hepatobiliary: Normal hepatic contours and density. No visible biliary dilatation. The patient is status post cholecystectomy. No biliary ductal dilation. Pancreas: Normal contours without ductal dilatation. No peripancreatic fluid collection. 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 Adrenals/Urinary Tract: --Adrenal glands: Normal. --Right kidney/ureter: No hydronephrosis or perinephric stranding. No nephrolithiasis. No obstructing ureteral stones. --Left kidney/ureter: No hydronephrosis  or perinephric stranding. No nephrolithiasis. No obstructing ureteral stones. --Urinary bladder: Unremarkable. Stomach/Bowel: --Stomach/Duodenum: No hiatal hernia or other gastric abnormality. Normal duodenal course and caliber. --Small bowel: No dilatation or inflammation. --Colon: No focal abnormality.  --Appendix: Normal. Lymphatic:  No abdominal or pelvic lymphadenopathy. Reproductive: No free fluid in the pelvis. Musculoskeletal. No bony spinal canal stenosis or focal osseous abnormality. Other: None. Review of the MIP images confirms the above findings. IMPRESSION: No acute aortic abnormality. Mild aortic atherosclerosis.  Aortic Atherosclerosis (ICD10-I70.0). No other acute intrathoracic or pelvic pathology. Electronically Signed   By: Jonna Clark M.D.   On: 09/23/2020 19:14   VAS [place] LOWER EXTREMITY VENOUS (DVT)  Result Date: 09/25/2020  Lower Venous DVT Study Indications: Stroke.  Comparison Study: No prior study Performing Technologist: Gertie Fey MHA, RDMS, RVT, RDCS  Examination Guidelines: A complete evaluation includes B-mode imaging, spectral Doppler, color Doppler, and power Doppler as needed of all accessible portions of each vessel. Bilateral testing is considered an integral part of a complete examination. Limited examinations for reoccurring indications may be performed as noted. The reflux portion of the exam is performed with the patient in reverse Trendelenburg.  +---------+---------------+---------+-----------+----------+--------------+ RIGHT    CompressibilityPhasicitySpontaneityPropertiesThrombus Aging +---------+---------------+---------+-----------+----------+--------------+ CFV      Full           Yes      Yes                                 +---------+---------------+---------+-----------+----------+--------------+ SFJ      Full                                                        +---------+---------------+---------+-----------+----------+--------------+ FV Prox  Full                                                        +---------+---------------+---------+-----------+----------+--------------+ FV Mid   Full                                                        +---------+---------------+---------+-----------+----------+--------------+  FV DistalFull                                                        +---------+---------------+---------+-----------+----------+--------------+ PFV      Full                                                        +---------+---------------+---------+-----------+----------+--------------+ POP      Full           Yes      Yes                                 +---------+---------------+---------+-----------+----------+--------------+  PTV      Full                                                        +---------+---------------+---------+-----------+----------+--------------+ PERO     Full                                                        +---------+---------------+---------+-----------+----------+--------------+   +---------+---------------+---------+-----------+----------+--------------+ LEFT     CompressibilityPhasicitySpontaneityPropertiesThrombus Aging +---------+---------------+---------+-----------+----------+--------------+ CFV      Full           Yes      Yes                                 +---------+---------------+---------+-----------+----------+--------------+ SFJ      Full                                                        +---------+---------------+---------+-----------+----------+--------------+ FV Prox  Full                                                        +---------+---------------+---------+-----------+----------+--------------+ FV Mid   Full                                                        +---------+---------------+---------+-----------+----------+--------------+ FV DistalFull                                                        +---------+---------------+---------+-----------+----------+--------------+ PFV      Full                                                        +---------+---------------+---------+-----------+----------+--------------+ POP      Full           Yes      Yes                                  +---------+---------------+---------+-----------+----------+--------------+ PTV      Full                                                        +---------+---------------+---------+-----------+----------+--------------+  PERO     Full                                                        +---------+---------------+---------+-----------+----------+--------------+     Summary: RIGHT: - There is no evidence of deep vein thrombosis in the lower extremity.  - No cystic structure found in the popliteal fossa.  LEFT: - There is no evidence of deep vein thrombosis in the lower extremity.  - No cystic structure found in the popliteal fossa.  *See table(s) above for measurements and observations. Electronically signed by Lemar Livings MD on 09/25/2020 at 3:31:03 PM.    Final      EKG: normal EKG, normal sinus rhythm, unchanged from previous tracings.   Physical Examination: Temp:  [97.7 F (36.5 C)-98 F (36.7 C)] 98 F (36.7 C) (11/22 1428) Pulse Rate:  [50-81] 64 (11/22 1428) Resp:  [10-30] 18 (11/22 1428) BP: (119-178)/(72-121) (P) 191/94 (11/22 1530) SpO2:  [91 %-100 %] 97 % (11/22 1428)  General - Well nourished, well developed, in no apparent distress .  Cardiovascular - Regular rate and rhythm .  Mental Status -  Level of arousal and orientation to time, place, and person were intact . Significant expressive aphasia with word finding difficulties.  Nonfluent speech.  Able to follow commands well.  Attention span and concentration were normal . Recent and remote memory were intact . Fund of Knowledge was assessed and was intact .  Cranial Nerves II - XII - II - Visual field intact OU . III, IV, VI - Extraocular movements intact.  Partial right sided visual field cut noted.. V - Facial sensation intact bilaterally . VII - Facial movement: has left facial weakness noted  VIII - Hearing & vestibular intact bilaterally . X - Palate elevates  symmetrically . XI - Chin turning & shoulder shrug intact bilaterally . XII - Tongue protrusion intact .  Motor Strength - The patient's strength was normal in all extremities and pronator drift was absent.  Bulk was normal and fasciculations were absent .   Motor Tone - Muscle tone was assessed at the neck and appendages and was normal .  Reflexes - The patient's reflexes were symmetrical in all extremities and he had no pathological reflexes .  Sensory - Light touch, temperature/pinprick were assessed and were symmetrical .    Coordination - The patient had normal movements in the hands and feet with no ataxia or dysmetria.  Tremor was absent .  Gait and Station - deferred.   Assessment:  Alejandro Perez is a 44 y.o. 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 CT head done in ED on admission shows an acute-subacute stroke at body of corpus callosum, which was not present on recent prior imaging; also seen were the known recent left MCA and right corona radiata strokes.    Stroke : Left ACA and left MCA branch infarct likely of cardioembolic etiology    LDL 130  [id number] 4.8   scds for VTE prophylaxis Diet Order  Diet NPO time specified  Diet effective now                   aspirin 81 mg daily and clopidogrel 75 mg daily prior to admission, now on aspirin 81 mg daily and clopidogrel 75 mg daily.  For 3 weeks and then clopidogrel alone  Therapy recommendations:  pending  Disposition:  Pending   Hypertension   Stable .  but gradually normalize blood pressure in 5-7 days . Long-term BP goal normotensive  Hyperlipidemia  Home meds:  catapres, hctz, metoprolol  , not yet resumed in hospital  LDL 130, goal < 70     Continue statin at discharge    Other Stroke Risk Factors   Cigarette smoker advised to stop smoking   ETOH use, advised to drink no more than 1 2 drink(s) a day   Hx stroke/TIA  Other Active Problems  Hospital day # 1  Patient with known history of polysubstance abuse as well as recent TIA presents with expressive aphasia with MRI scan showing multifocal infarcts likely of cardioembolic etiology.  Patient is not a good long-term anticoagulation candidate given history of noncompliance and polysubstance abuse hence do not recommend long-term cardiac monitoring at this time.  Patient counseled to quit marijuana as well as cocaine.  Aspirin and Plavix for 3 weeks followed by Plavix alone.  Aggressive risk factor modification.  Physical occupational and speech therapy consults.  Long discussion with patient and girlfriend and answered questions.  Greater than 50% time during this 35-minute visit was spent in counseling and coordination of care about his strokes and answering questions about stroke prevention and treatment.  Stroke team will sign off.  Kindly call for questions.  Delia Heady, MD  To contact Stroke Continuity provider, please refer to [email or web address]. After hours, contact General Neurology

## 2020-10-11 NOTE — Progress Notes (Signed)
Pt complaint of SOB checked O2 Sat on RA=98% rate=84m HR= 68 bpm, pt was anxious, given ativan as ordered, encourage to rest and sleep

## 2020-10-11 NOTE — Progress Notes (Signed)
Speech Language Pathology Evaluation Patient Details Name: Alejandro Perez MRN: [id number] DOB: November 11, 1976 Today's Date: 10/11/2020 Time: 1050-1105 SLP Time Calculation (min) (ACUTE ONLY): 15 min  Problem List:  Patient Active Problem List   Diagnosis Date Noted  . Acute ischemic stroke (HCC) 10/10/2020  . CKD (chronic kidney disease) 09/24/2020  . (HFpEF) heart failure with preserved ejection fraction (HCC) 09/24/2020  . CVA (cerebral vascular accident) (HCC) 09/23/2020  . SOB (shortness of breath)   . 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 Nausea 10/11/2019  . Splenomegaly 10/08/2019  . Vitamin B12 deficiency   . Pulmonary HTN (HCC)   . Chest pain 12/21/2017  . Thrombocytopenia (HCC) 12/21/2017  . Spleen enlarged 12/21/2017  . Schistocytes on peripheral blood smear 12/21/2017  . Hypertension   . Anemia   . Acquired TTP   . Abdominal pain, RUQ    Past Medical History:  Past Medical History:  Diagnosis Date  . Anemia   . Chest pain   . Glaucoma   . Hypertension   . LVH (left ventricular hypertrophy)   . Spleen enlarged   . Stroke (HCC)   . Thrombocytopenia (HCC)    Past Surgical History:  Past Surgical History:  Procedure Laterality Date  . BUBBLE STUDY  09/28/2020   Procedure: BUBBLE STUDY;  Surgeon: [name], MD;  Location: [facility] ENDOSCOPY;  Service: Cardiovascular;;  . CHOLECYSTECTOMY    . SPLENECTOMY, 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   Procedure: SPLENECTOMY;  Surgeon: [name], MD;  Location: [facility] OR;  Service: General;  Laterality: N/A;  . TEE WITHOUT CARDIOVERSION N/A 09/28/2020   Procedure: TRANSESOPHAGEAL ECHOCARDIOGRAM (TEE);  Surgeon: [name], MD;  Location: [facility] ENDOSCOPY;  Service: Cardiovascular;  Laterality: N/A;   HPI:   Alejandro Perez is a 44 y.o. male with medical history significant of recent stroke left MCA and subacute right corona radiata and other chronic lacunar infarct, HTN, LVH, remote history of TTP, status post  splenectomy, CKD stage II. MRI head revealed acute ischemic nonhemorrhagic left ACA territory infarct   Assessment / Plan / Recommendation Clinical Impression  Pt was seen for SLE. The WAB-Bedside was administered and he scored 45/60, revealing an expressive aphasia. The pt's spontaneous speech was observed to be halting, effortful, and paraphasic. He demonstrated significant word finding difficulty at the phrase and sentence level and he frequently groped for the correct words. The pt also produced frequent semantic and phonemic paraphasias. He is aware of them and attempts to correct them, although these attempts were only successful ~50% of the time. Repetition was intact at the word and phrase level, but became impaired at the sentence level. His auditory comprehension was observed to be [facility] for the tasks assessed. Pt would benefit from continued SLP treatment to address expressive aphasia. SLP will f/u acutely.      SLP Assessment  SLP Recommendation/Assessment: Patient needs continued Speech Lanaguage Pathology Services SLP Visit Diagnosis: Aphasia (R47.01)    Follow Up Recommendations  Inpatient Rehab;Outpatient SLP;Home health SLP    Frequency and Duration min 2x/week  2 weeks      SLP Evaluation Cognition  Overall Cognitive Status: Impaired/Different from baseline Arousal/Alertness: Awake/alert Orientation Level: Oriented X4 Memory: Appears intact Awareness: Appears intact       Comprehension  Auditory Comprehension Overall Auditory Comprehension: Appears within functional limits for tasks assessed Yes/No Questions: Within Functional Limits Commands: Within Functional Limits Conversation: Simple Visual Recognition/Discrimination Discrimination: Not tested Reading Comprehension Reading Status: Not tested    Expression Expression  Primary Mode of Expression: Verbal Verbal Expression Overall Verbal Expression: Impaired Initiation: No impairment Automatic Speech:  Name;Social Response Level of Generative/Spontaneous Verbalization: Sentence Repetition: Impaired Level of Impairment: Sentence level Naming: No impairment Confrontation: Within functional limits Convergent: Not tested Divergent: Not tested Verbal Errors: Semantic paraphasias;Phonemic paraphasias;Aware of errors Pragmatics: No impairment Written Expression Written Expression: Not tested   Oral / Motor  Oral Motor/Sensory Function Overall Oral Motor/Sensory Function: Within functional limits Facial ROM: Within Functional Limits Facial Symmetry: Within Functional Limits Lingual ROM: Within Functional Limits Lingual Symmetry: Within Functional Limits Lingual Strength: Within Functional Limits Motor Speech Overall Motor Speech: Appears within functional limits for tasks assessed Respiration: Within functional limits Phonation: Normal Resonance: Within functional limits Articulation: Within functional limitis Intelligibility: Intelligible Motor Planning: Not tested Motor Speech Errors: Not applicable   GO                    Royetta Crochet 10/11/2020, 12:30 PM

## 2020-10-11 NOTE — ED Notes (Signed)
Dr Tanna Furry approved incresing Dilaudid to 1mg  IV and administering first does now.

## 2020-10-12 DIAGNOSIS — R072 Precordial pain: Secondary | ICD-10-CM

## 2020-10-12 DIAGNOSIS — R079 Chest pain, unspecified: Secondary | ICD-10-CM

## 2020-10-12 DIAGNOSIS — I10 Essential (primary) hypertension: Secondary | ICD-10-CM | POA: Diagnosis not present

## 2020-10-12 LAB — COMPREHENSIVE METABOLIC PANEL
ALT: 93 U/L — ABNORMAL HIGH (ref 0–44)
AST: 66 U/L — ABNORMAL HIGH (ref 15–41)
Albumin: 4.5 g/dL (ref 3.5–5.0)
Alkaline Phosphatase: 162 U/L — ABNORMAL HIGH (ref 38–126)
Anion gap: 15 (ref 5–15)
BUN: 15 mg/dL (ref 6–20)
CO2: 29 mmol/L (ref 22–32)
Calcium: 10 mg/dL (ref 8.9–10.3)
Chloride: 90 mmol/L — ABNORMAL LOW (ref 98–111)
Creatinine, Ser: 1.67 mg/dL — ABNORMAL HIGH (ref 0.61–1.24)
GFR, Estimated: 51 mL/min — ABNORMAL LOW (ref >60)
Glucose, Bld: 98 mg/dL (ref 70–99)
Potassium: 3 mmol/L — ABNORMAL LOW (ref 3.5–5.1)
Sodium: 134 mmol/L — ABNORMAL LOW (ref 135–145)
Total Bilirubin: 1.2 mg/dL (ref 0.3–1.2)
Total Protein: 7.7 g/dL (ref 6.5–8.1)

## 2020-10-12 LAB — CBC
HCT: 47.1 % (ref 39.0–52.0)
Hemoglobin: 17.6 g/dL — ABNORMAL HIGH (ref 13.0–17.0)
MCH: 30.9 pg (ref 26.0–34.0)
MCHC: 37.4 g/dL — ABNORMAL HIGH (ref 30.0–36.0)
MCV: 82.8 fL (ref 80.0–100.0)
Platelets: 501 10*3/uL — ABNORMAL HIGH (ref 150–400)
RBC: 5.69 MIL/uL (ref 4.22–5.81)
RDW: 12.5 % (ref 11.5–15.5)
WBC: 8.5 10*3/uL (ref 4.0–10.5)
nRBC: 0 % (ref 0.0–0.2)

## 2020-10-12 LAB — MAGNESIUM: Magnesium: 2.1 mg/dL (ref 1.7–2.4)

## 2020-10-12 LAB — PHOSPHORUS: Phosphorus: 3.3 mg/dL (ref 2.5–4.6)

## 2020-10-12 MED ORDER — HYDROMORPHONE HCL 1 MG/ML IJ SOLN: 1.0000 mg | INTRAMUSCULAR | Status: DC | PRN

## 2020-10-12 MED ORDER — LORAZEPAM 1 MG PO TABS
1.0000 mg | ORAL_TABLET | Freq: Two times a day (BID) | ORAL | Status: AC
  Administered 2020-10-12 – 2020-10-14 (×3): 1 mg via ORAL
  Filled 2020-10-12 (×3): qty 1

## 2020-10-12 MED ORDER — PANTOPRAZOLE SODIUM 40 MG IV SOLR
40.0000 mg | INTRAVENOUS | Status: DC
  Administered 2020-10-12: 40 mg via INTRAVENOUS
  Filled 2020-10-12: qty 40

## 2020-10-12 MED ORDER — POTASSIUM CHLORIDE 10 MEQ/100ML IV SOLN
10.0000 meq | INTRAVENOUS | Status: AC
  Administered 2020-10-12 (×2): 10 meq via INTRAVENOUS
  Filled 2020-10-12 (×2): qty 100

## 2020-10-12 MED ORDER — SUCRALFATE 1 G PO TABS
1.0000 g | ORAL_TABLET | Freq: Three times a day (TID) | ORAL | Status: DC
  Administered 2020-10-12 – 2020-10-15 (×13): 1 g via ORAL
  Filled 2020-10-12 (×12): qty 1

## 2020-10-12 MED ORDER — PANTOPRAZOLE SODIUM 40 MG PO TBEC
40.0000 mg | DELAYED_RELEASE_TABLET | Freq: Every day | ORAL | Status: DC
  Administered 2020-10-13 – 2020-10-15 (×3): 40 mg via ORAL
  Filled 2020-10-12 (×3): qty 1

## 2020-10-12 MED ORDER — HYDROMORPHONE HCL 1 MG/ML IJ SOLN
1.0000 mg | INTRAMUSCULAR | Status: AC | PRN
  Administered 2020-10-12 – 2020-10-13 (×6): 1 mg via INTRAVENOUS
  Filled 2020-10-12 (×6): qty 1

## 2020-10-12 NOTE — Progress Notes (Signed)
PROGRESS NOTE    Alejandro Perez  JXB:[id number] DOB: [birth year] DOA: 10/10/2020  PCP: Pcp, No    Brief Narrative:  HPI: Alejandro Perez is a 44 y.o. male with medical history significant of recent stroke left MCA and subacute right corona radiata and other chronic lacunar infarct, HTN, LVH, remote history of TTP, status post splenectomy, CKD stage II. It appeared that on last mission, the patient had persistent with dizziness and falling to his left, and CT head showed acute infarct of the left MCA territory and a subacute right corona radiata with some other chronic lacunar infarct.  On same admission, patient was complaining about chest pain, CT angiogram was done which came back negative for dissections.  Patient UDS was positive for cocaine.  Patient was discharged home and remained stable until 3 days ago, he started to experience similar chest pain as last time centrally located persistent sharp-like, no exacerbation or relieving factors.  Associated with palpitations.  And again he started to experience global headache, denied any nauseous vomit, no vision changes, he also reported worsening of the left arm weakness.  Denied any leg weakness no speech problems no vision changes.  And he also denied any recent cocaine use.  ED Course: Given the patient complaining about headache, CT head was done, which showed new hypoattenuation along the ventral body of the left corpus callosum eccentric to the left suspicious for acute subacute infarct.  Neurologyand MRI ordered.  UDS ordered to rule out recent cocaine use.  Assessment & Plan:  Acute left CVA -No electrolyte about the etiology, might be just a evolving stroke from last time. -Neurology on board, MRI ordered -Continue risk factor modification, continue aspirin Plavix, statin.  Will modify his BP meds regimen as below. - greatly appreciate neurology consult and management esp with  recommendation on BP and anticoagulation, reducing bp and  BP goal. -pt to continue dual antiplatelet therapy with asa and plavix and cont Plavix thereafter.  -- again per neurology pt to  aspirin 81 mg daily and clopidogrel 75 mg daily prior to admission, now on aspirin 81 mg daily and clopidogrel 75 mg daily.  For 3 weeks and then clopidogrel alone Received call from neurology yesterday about pt's aphasia, I suspect he may be amlingering as he was not having any dysarthria with me or nurse, he had bedside swallow and he has had a regular consistency diet.   Chest pain -2 sets troponin negative EKG no change as compared to last time, ACS ruled out. 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  UDS ordered. -We will treat as cocaine induced angina, Ativan 3 times daily x2 days.  Change metoprolol to Coreg. - echo pending.chest pain complaints , I suspect due to his ivdu history that he is pain seeking to a certain degree. We will increase his dilaudid. Pt did have a tee on the 9th.will start iv ppi and pt also had a cta on the 4th and it was negative. _ pt did not reports any chest pain to me.   Uncontrolled hypertension -Along with borderline bradycardia, will stop clonidine -Change metoprolol to Coreg as above, add as needed hydralazine. -Discontinue HCTZ given his CKD conditions, continue Lasix. Home bp meds resumed. 10/11/20 1400  --  68  67  14  126/72  --  95 %  --  --  --  -- MI   BP has been intermittent and pt gets agitated and abusive. We have also  started ativan 1 mg po q12h.    Bradycardia -As above Blood pressure (!) 191/94, pulse 74, temperature 97.8 F (36.6 C), temperature source Oral, resp. rate 20, height 6\' 2"  (1.88 m), weight 67 kg, SpO2 100 %.   CKD stage II -Appears to be euvolemic. Lab Results  Component Value Date   CREATININE 1.87 (H) 10/10/2020   CREATININE 1.49 (H) 09/27/2020   CREATININE 1.60 (H) 09/26/2020   Lab Results  Component Value Date   CREATININE 1.67 (H)  10/12/2020   CREATININE 1.65 (H) 10/11/2020   CREATININE 1.87 (H) 10/10/2020  kidney function has improved some to 1.67, and low potassium was replaced.   DVT prophylaxis: scd's Code Status: Full code Family Communication: Wife at bedside Disposition Plan: Expect more than 2 midnight hospital stay for new stroke work-up pending probably cocaine related anginal treatment. Consults called: Neurology   Subjective/Overview: Pt is cooperative c/o pain in his head and informed him we will treat his pain.Pt states he knows he has a stroke the patient denies any other complaints and has no deficits neurologically on exam.no dysarthria or dysphagia. 10/11/20 Pt seen in am and he Is alert,awake and oriented and denies any complaints other than headaches.  10/12/20 Received reports from APP [facility] about pt being abusive and requesting high and frequent doses of pain medication, and threatening to leave against medical advice.  Informed her pa may received tylenol and pain meds have not been increased or changed. Today in am on rounds pt did wince to abdominal pain and was c/o stomach pain. I suspect this is drug seeking behavior but I have increased his dilaudid frequency to every 3 hours.  Night nurse also reported to me he was having chest pain and ekg was abnormal , it was similar to previous ekg and cardiology was consulted. We greatly appreciate cardiology consult and management with this patient. Pt has refused OT eval. SLP recommends inpatient rehab.     Objective: Vitals:   10/11/20 1120 10/11/20 1151 10/11/20 1200 10/11/20 1205  BP: (!) 154/87  (!) 149/93 (!) 147/95  Pulse: 62 63 (!) 58 66  Resp: 14 (!) 26 16 20   Temp:      TempSrc:      SpO2: 95% 95% 92% 96%  Weight:      Height:       SpO2: 100 %  Intake/Output Summary (Last 24 hours) at 10/11/2020 1252 Last data filed at 10/11/2020 0551 Gross per 24 hour  Intake --  Output 1600 ml  Net -1600 ml   Filed Weights    10/10/20 1317  Weight: 73.5 kg    Examination: Blood pressure (!) 147/95, pulse 66, temperature 98 F (36.7 C), temperature source Oral, resp. rate 20, height 6\' 2"  (1.88 m), weight 73.5 kg, SpO2 96 %. General exam: Appears calm and comfortable  HEENT:EOMI, perrl  Respiratory system: Clear to auscultation. Respiratory effort normal. Cardiovascular system: S1 & S2 heard, RRR. No JVD, murmurs, rubs, gallops or clicks. No pedal edema. Gastrointestinal system: Abdomen is nondistended, soft and nontender. No organomegaly or masses felt. Normal bowel sounds heard. Central nervous system: Alert and oriented.CN grossly intact No focal neurological deficits. Extremities: pt moving all 4 ext no weakness or paralysis. Skin: No rashes, lesions or ulcers Psychiatry: Judgement and insight appear normal. Mood & affect appropriate.    Data Reviewed: I have personally reviewed following labs and imaging studies  Labs  No results for input(s): CKTOTAL, CKMB, TROPONINI in the last 72  hours. Lab Results  Component Value Date   WBC 9.2 10/10/2020   HGB 16.5 10/10/2020   HCT 45.7 10/10/2020   MCV 85.6 10/10/2020   PLT 491 (H) 10/10/2020    Recent Labs  Lab 10/12/20 0414  NA 134*  K 3.0*  CL 90*  CO2 29  BUN 15  CREATININE 1.67*  CALCIUM 10.0  PROT 7.7  BILITOT 1.2  ALKPHOS 162*  ALT 93*  AST 66*  GLUCOSE 98   Lab Results  Component Value Date   CHOL 186 09/24/2020   HDL 44 09/24/2020   LDLCALC 130 (H) 09/24/2020   TRIG 62 09/24/2020    Radiology Studies: DG Chest 2 View  Result Date: 10/10/2020 CLINICAL DATA:  Chest pain and headache 4 days. EXAM: CHEST - 2 VIEW COMPARISON:  09/25/2020 and CT 09/23/2020 FINDINGS: Lungs are adequately inflated without focal airspace consolidation or effusion. Calcified granuloma over the right middle lobe. Cardiomediastinal silhouette and remainder of the exam is unchanged. IMPRESSION: No active cardiopulmonary disease. Electronically Signed    By: Elberta Fortis M.D.   On: 10/10/2020 14:30   CT Head Wo Contrast  Result Date: 10/10/2020 CLINICAL DATA:  Headache, recent stroke EXAM: CT HEAD WITHOUT CONTRAST TECHNIQUE: Contiguous axial images were obtained from the base of the skull through the vertex without intravenous contrast. COMPARISON:  09/25/2020 FINDINGS: Brain: New hypoattenuation along the ventral body of the corpus callosum eccentric to the left. Evolving recent infarcts of the posterior left MCA territory and right corona radiata. Chronic infarcts of the left corona radiata and right thalamus. No acute intracranial hemorrhage or significant mass effect. No hydrocephalus or extra-axial collection. Vascular: No hyperdense vessel. There is atherosclerotic calcification at the skull base. Skull: Calvarium is unremarkable. Sinuses/Orbits: No acute finding. Other: None. IMPRESSION: New hypoattenuation along the ventral body of the corpus callosum eccentric to the left suspicious for acute or subacute infarction. Evolving recent infarcts of the posterior left MCA territory and right corona radiata. No acute intracranial hemorrhage. These results were called by telephone at the time of interpretation on 10/10/2020 at 4:47 pm to provider Trudee Grip, who verbally acknowledged these results. Electronically Signed   By: Guadlupe Spanish M.D.   On: 10/10/2020 16:47   MR ANGIO HEAD WO CONTRAST  Result Date: 10/10/2020 CLINICAL DATA:  Initial evaluation for neuro deficit, stroke suspected. EXAM: MRI HEAD WITHOUT CONTRAST MRA HEAD WITHOUT CONTRAST MRA NECK WITHOUT CONTRAST TECHNIQUE: Multiplanar, multiecho pulse sequences of the brain and surrounding structures were obtained without intravenous contrast. Angiographic images of the Circle of Willis were obtained using MRA technique without intravenous contrast. Angiographic images of the neck were obtained using MRA technique without intravenous contrast. Carotid stenosis measurements (when applicable)  are obtained utilizing NASCET criteria, using the distal internal carotid diameter as the denominator. COMPARISON:  Prior CT from earlier same day as well as earlier studies. FINDINGS: MRI HEAD FINDINGS Brain: Cerebral volume within normal limits for age. Multiple scattered remote lacunar infarcts noted about the left basal ganglia/corona radiata and right thalamus. Perforator type lacunar infarct involving the posterior right basal ganglia/corona radiata demonstrates mild residual diffusion abnormality without ADC correlate, consistent with a late subacute ischemic infarct (series 5, image 83). No associated hemorrhage or mass effect. Confluent area of restricted diffusion involving the body of the left corpus callosum measures 4.3 x 1.7 cm, consistent with an acute left ACA territory infarct (series 5, image 83). Mild diffusion abnormality/ischemia noted within the contralateral right corpus callosum as well (series  6, image 34). No associated hemorrhage or mass effect. Patchy and confluent restricted diffusion involving the left parieto-occipital region is seen (series 5, image 84). Associated signal loss seen on ADC map (series 6, image 34), consistent with an acute left MCA distribution infarct. 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 Cortically based T1 hyperintensity and susceptibility artifact consistent with laminar necrosis and/or petechial hemorrhage. No significant regional mass effect. Additional punctate acute ischemic nonhemorrhagic infarct noted anteriorly within the left frontal centrum semi ovale (series 5, image 90). No other evidence for acute or subacute ischemia. Gray-white matter differentiation otherwise maintained. No mass lesion or midline shift. No hydrocephalus. Subtle 2 mm FLAIR hyperintensity with associated susceptibility artifacts seen overlying the left frontal convexity, consistent with a small subdural hematoma, likely  subacute in nature (series 9, image 16). No associated mass effect. Additional faint component noted posteriorly at the left parietal convexity. Pituitary gland suprasellar region normal. Vascular: Major intracranial vascular flow voids are maintained. Skull and upper cervical spine: Craniocervical junction normal. Bone marrow signal intensity within normal limits. No visible scalp soft tissue injury. Sinuses/Orbits: Globes and orbital soft tissues grossly within normal limits. Scattered mucosal thickening noted within the ethmoidal air cells and maxillary sinuses. Paranasal sinuses are otherwise clear. No significant mastoid effusion. Inner ear structures normal. Other: None. MRA HEAD FINDINGS ANTERIOR CIRCULATION: Examination mildly degraded by motion artifact. Visualized distal cervical segments of the internal carotid arteries are patent with antegrade flow. Petrous segments patent bilaterally. Atheromatous irregularity within the cavernous/supraclinoid segments without high-grade stenosis. A1 segments patent bilaterally. Normal anterior communicating artery complex. There are severe near occlusive stenoses involving the proximal A2 segments bilaterally (series 18, image 114 on the right, series 18, image 117 on the left). Additional severe downstream 8 3 stenosis (series 18, image 152). ACAs remain perfused to their distal aspects at this time. No M1 stenosis or occlusion. Negative MCA bifurcations. Distal right MCA branches remain perfused, although demonstrate advanced small vessel atheromatous irregularity for age. On the left, there is focal signal loss within a proximal left M2 branch, inferior division (series 18, image 109). Finding could reflect severe high-grade stenosis or possibly subocclusive thrombus. Left MCA branches otherwise perfused, although demonstrate advanced age-related small vessel atheromatous irregularity. POSTERIOR CIRCULATION: Visualized vertebral arteries patent to the  vertebrobasilar junction without stenosis. Left PICA patent proximally. Basilar patent to its distal aspect without stenosis. Superior cerebral arteries patent bilaterally. Predominant fetal type origin of the PCAs bilaterally. There is a severe near occlusive stenosis involving the mid left P2 segment (series 26, image 8). PCAs otherwise patent without proximal high-grade stenosis, although demonstrate extensive distal small vessel atheromatous irregularity. MRA NECK FINDINGS AORTIC ARCH: Examination somewhat limited by lack of IV contrast. Visualized aortic arch of normal caliber with normal branch pattern. No hemodynamically significant stenosis seen about the origin of the great vessels. RIGHT CAROTID SYSTEM: Right CCA patent from its origin to the bifurcation without stenosis. Mild atheromatous narrowing of no more than 20% at the origin of the right ICA at the bifurcation. Right ICA patent distally to the skull base without stenosis, evidence for dissection, or occlusion. LEFT CAROTID SYSTEM: Left CCA patent from its origin to the bifurcation without stenosis. Atheromatous narrowing of up to approximately 40% at the origin of the left ICA (series 38, image 5). Left ICA otherwise patent to the skull base without stenosis, evidence for dissection or occlusion. VERTEBRAL ARTERIES: Both vertebral arteries arise from the subclavian arteries.  Vertebral arteries diminutive, although the right is slightly dominant. Vertebral arteries patent within the neck without appreciable stenosis, evidence for dissection or occlusion. IMPRESSION: MRI HEAD IMPRESSION: 1. 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 Additional patchy acute ischemic infarcts involving the left parieto-occipital region, left MCA distribution. This is superimposed on an underlying late subacute left MCA infarct at this location. Evidence for associated laminar necrosis and/or petechial  hemorrhage. 3. 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 4. Underlying multiple remote lacunar infarcts involving the left basal ganglia/corona radiata and right thalamus. 5. Tiny 2 mm subdural hematoma overlying the left frontal convexity as above, likely subacute in nature. No associated mass effect. MRA HEAD IMPRESSION: 1. Focal signal loss within a proximal left M2 branch, inferior division. Finding could reflect severe high-grade stenosis or possibly subocclusive thrombus. 2. 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 3. Severe mid left P2 stenosis. There is predominant fetal type origin of the PCAs bilaterally. 4. Extensive small vessel atheromatous irregularity throughout the intracranial circulation, advanced for age. MRA NECK IMPRESSION: 1. Mild atheromatous narrowing about the origins of both ICAs, measuring up to 40% on the left and 20% on the right. 2. Diminutive but patent vertebral arteries within the neck. Right vertebral artery slightly dominant. Case discussed by telephone at the time of interpretation on 10/10/2020 at 9:14 pm to provider [facility], who verbally acknowledged these results. Electronically Signed   By: Rise Mu M.D.   On: 10/10/2020 21:16   MR ANGIO NECK WO CONTRAST  Result Date: 10/10/2020 CLINICAL DATA:  Initial evaluation for neuro deficit, stroke suspected. EXAM: MRI HEAD WITHOUT CONTRAST MRA HEAD WITHOUT CONTRAST MRA NECK WITHOUT CONTRAST TECHNIQUE: Multiplanar, multiecho pulse sequences of the brain and surrounding structures were obtained without intravenous contrast. Angiographic images of the Circle of Willis were obtained using MRA technique without intravenous contrast. Angiographic images of the neck were obtained using MRA technique without intravenous contrast. Carotid stenosis measurements (when applicable) are obtained  utilizing NASCET criteria, using the distal internal carotid diameter as the denominator. COMPARISON:  Prior CT from earlier same day as well as earlier studies. FINDINGS: MRI HEAD FINDINGS Brain: Cerebral volume within normal limits for age. Multiple scattered remote lacunar infarcts noted about the left basal ganglia/corona radiata and right thalamus. Perforator type lacunar infarct involving the posterior right basal ganglia/corona radiata demonstrates mild residual diffusion abnormality without ADC correlate, consistent with a late subacute ischemic infarct (series 5, image 83). No associated hemorrhage or mass effect. Confluent area of restricted diffusion involving the body of the left corpus callosum measures 4.3 x 1.7 cm, consistent with an acute left ACA territory infarct (series 5, image 83). Mild diffusion abnormality/ischemia noted within the contralateral right corpus callosum as well (series 6, image 34). No associated hemorrhage or mass effect. Patchy and confluent restricted diffusion involving the left parieto-occipital region is seen (series 5, image 84). Associated signal loss seen on ADC map (series 6, image 34), consistent with an acute left MCA distribution infarct. 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 Cortically based T1 hyperintensity and susceptibility artifact consistent with laminar necrosis and/or petechial hemorrhage. No significant regional mass effect. Additional punctate acute ischemic nonhemorrhagic infarct noted anteriorly within the left frontal centrum semi ovale (series 5, image 90). No other evidence for acute or subacute ischemia. Gray-white matter  differentiation otherwise maintained. No mass lesion or midline shift. No hydrocephalus. Subtle 2 mm FLAIR hyperintensity with associated susceptibility artifacts seen overlying the left frontal convexity, consistent with a small subdural hematoma, likely subacute in nature  (series 9, image 16). No associated mass effect. Additional faint component noted posteriorly at the left parietal convexity. Pituitary gland suprasellar region normal. Vascular: Major intracranial vascular flow voids are maintained. Skull and upper cervical spine: Craniocervical junction normal. Bone marrow signal intensity within normal limits. No visible scalp soft tissue injury. Sinuses/Orbits: Globes and orbital soft tissues grossly within normal limits. Scattered mucosal thickening noted within the ethmoidal air cells and maxillary sinuses. Paranasal sinuses are otherwise clear. No significant mastoid effusion. Inner ear structures normal. Other: None. MRA HEAD FINDINGS ANTERIOR CIRCULATION: Examination mildly degraded by motion artifact. Visualized distal cervical segments of the internal carotid arteries are patent with antegrade flow. Petrous segments patent bilaterally. Atheromatous irregularity within the cavernous/supraclinoid segments without high-grade stenosis. A1 segments patent bilaterally. Normal anterior communicating artery complex. There are severe near occlusive stenoses involving the proximal A2 segments bilaterally (series 18, image 114 on the right, series 18, image 117 on the left). Additional severe downstream 8 3 stenosis (series 18, image 152). ACAs remain perfused to their distal aspects at this time. No M1 stenosis or occlusion. Negative MCA bifurcations. Distal right MCA branches remain perfused, although demonstrate advanced small vessel atheromatous irregularity for age. On the left, there is focal signal loss within a proximal left M2 branch, inferior division (series 18, image 109). Finding could reflect severe high-grade stenosis or possibly subocclusive thrombus. Left MCA branches otherwise perfused, although demonstrate advanced age-related small vessel atheromatous irregularity. POSTERIOR CIRCULATION: Visualized vertebral arteries patent to the vertebrobasilar junction without  stenosis. Left PICA patent proximally. Basilar patent to its distal aspect without stenosis. Superior cerebral arteries patent bilaterally. Predominant fetal type origin of the PCAs bilaterally. There is a severe near occlusive stenosis involving the mid left P2 segment (series 26, image 8). PCAs otherwise patent without proximal high-grade stenosis, although demonstrate extensive distal small vessel atheromatous irregularity. MRA NECK FINDINGS AORTIC ARCH: Examination somewhat limited by lack of IV contrast. Visualized aortic arch of normal caliber with normal branch pattern. No hemodynamically significant stenosis seen about the origin of the great vessels. RIGHT CAROTID SYSTEM: Right CCA patent from its origin to the bifurcation without stenosis. Mild atheromatous narrowing of no more than 20% at the origin of the right ICA at the bifurcation. Right ICA patent distally to the skull base without stenosis, evidence for dissection, or occlusion. LEFT CAROTID SYSTEM: Left CCA patent from its origin to the bifurcation without stenosis. Atheromatous narrowing of up to approximately 40% at the origin of the left ICA (series 38, image 5). Left ICA otherwise patent to the skull base without stenosis, evidence for dissection or occlusion. VERTEBRAL ARTERIES: Both vertebral arteries arise from the subclavian arteries. Vertebral arteries diminutive, although the right is slightly dominant. Vertebral arteries patent within the neck without appreciable stenosis, evidence for dissection or occlusion. IMPRESSION: MRI HEAD IMPRESSION: 1. 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 Additional patchy acute ischemic infarcts involving the left parieto-occipital region, left MCA distribution. This is superimposed on an underlying late subacute left MCA infarct at this location. Evidence for associated laminar necrosis and/or petechial hemorrhage. 3. Additional late  subacute to chronic perforator type infarct involving the posterior right basal ganglia/corona radiata. 4. Underlying multiple remote lacunar infarcts involving the left basal  ganglia/corona radiata and right thalamus. 5. Tiny 2 mm subdural hematoma overlying the left frontal convexity as above, likely subacute in nature. No associated mass effect. MRA HEAD IMPRESSION: 1. Focal signal loss within a proximal left M2 branch, inferior division. Finding could reflect severe high-grade stenosis or possibly subocclusive thrombus. 2. 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 3. Severe mid left P2 stenosis. There is predominant fetal type origin of the PCAs bilaterally. 4. Extensive small vessel atheromatous irregularity throughout the intracranial circulation, advanced for age. MRA NECK IMPRESSION: 1. Mild atheromatous narrowing about the origins of both ICAs, measuring up to 40% on the left and 20% on the right. 2. Diminutive but patent vertebral arteries within the neck. Right vertebral artery slightly dominant. Case discussed by telephone at the time of interpretation on 10/10/2020 at 9:14 pm to provider [facility], who verbally acknowledged these results. Electronically Signed   By: Rise Mu M.D.   On: 10/10/2020 21:16   MR BRAIN WO CONTRAST  Result Date: 10/10/2020 CLINICAL DATA:  Initial evaluation for neuro deficit, stroke suspected. EXAM: MRI HEAD WITHOUT CONTRAST MRA HEAD WITHOUT CONTRAST MRA NECK WITHOUT CONTRAST TECHNIQUE: Multiplanar, multiecho pulse sequences of the brain and surrounding structures were obtained without intravenous contrast. Angiographic images of the Circle of Willis were obtained using MRA technique without intravenous contrast. Angiographic images of the neck were obtained using MRA technique without intravenous contrast. Carotid stenosis measurements (when applicable) are obtained utilizing NASCET criteria, using the distal  internal carotid diameter as the denominator. COMPARISON:  Prior CT from earlier same day as well as earlier studies. FINDINGS: MRI HEAD FINDINGS Brain: Cerebral volume within normal limits for age. Multiple scattered remote lacunar infarcts noted about the left basal ganglia/corona radiata and right thalamus. Perforator type lacunar infarct involving the posterior right basal ganglia/corona radiata demonstrates mild residual diffusion abnormality without ADC correlate, consistent with a late subacute ischemic infarct (series 5, image 83). No associated hemorrhage or mass effect. Confluent area of restricted diffusion involving the body of the left corpus callosum measures 4.3 x 1.7 cm, consistent with an acute left ACA territory infarct (series 5, image 83). Mild diffusion abnormality/ischemia noted within the contralateral right corpus callosum as well (series 6, image 34). No associated hemorrhage or mass effect. Patchy and confluent restricted diffusion involving the left parieto-occipital region is seen (series 5, image 84). Associated signal loss seen on ADC map (series 6, image 34), consistent with an acute left MCA distribution infarct. 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 Cortically based T1 hyperintensity and susceptibility artifact consistent with laminar necrosis and/or petechial hemorrhage. No significant regional mass effect. Additional punctate acute ischemic nonhemorrhagic infarct noted anteriorly within the left frontal centrum semi ovale (series 5, image 90). No other evidence for acute or subacute ischemia. Gray-white matter differentiation otherwise maintained. No mass lesion or midline shift. No hydrocephalus. Subtle 2 mm FLAIR hyperintensity with associated susceptibility artifacts seen overlying the left frontal convexity, consistent with a small subdural hematoma, likely subacute in nature (series 9, image 16). No associated mass  effect. Additional faint component noted posteriorly at the left parietal convexity. Pituitary gland suprasellar region normal. Vascular: Major intracranial vascular flow voids are maintained. Skull and upper cervical spine: Craniocervical junction normal. Bone marrow signal intensity within normal limits. No visible scalp soft tissue injury. Sinuses/Orbits: Globes and orbital soft tissues grossly within normal limits. Scattered mucosal thickening noted within the ethmoidal air  cells and maxillary sinuses. Paranasal sinuses are otherwise clear. No significant mastoid effusion. Inner ear structures normal. Other: None. MRA HEAD FINDINGS ANTERIOR CIRCULATION: Examination mildly degraded by motion artifact. Visualized distal cervical segments of the internal carotid arteries are patent with antegrade flow. Petrous segments patent bilaterally. Atheromatous irregularity within the cavernous/supraclinoid segments without high-grade stenosis. A1 segments patent bilaterally. Normal anterior communicating artery complex. There are severe near occlusive stenoses involving the proximal A2 segments bilaterally (series 18, image 114 on the right, series 18, image 117 on the left). Additional severe downstream 8 3 stenosis (series 18, image 152). ACAs remain perfused to their distal aspects at this time. No M1 stenosis or occlusion. Negative MCA bifurcations. Distal right MCA branches remain perfused, although demonstrate advanced small vessel atheromatous irregularity for age. On the left, there is focal signal loss within a proximal left M2 branch, inferior division (series 18, image 109). Finding could reflect severe high-grade stenosis or possibly subocclusive thrombus. Left MCA branches otherwise perfused, although demonstrate advanced age-related small vessel atheromatous irregularity. POSTERIOR CIRCULATION: Visualized vertebral arteries patent to the vertebrobasilar junction without stenosis. Left PICA patent proximally.  Basilar patent to its distal aspect without stenosis. Superior cerebral arteries patent bilaterally. Predominant fetal type origin of the PCAs bilaterally. There is a severe near occlusive stenosis involving the mid left P2 segment (series 26, image 8). PCAs otherwise patent without proximal high-grade stenosis, although demonstrate extensive distal small vessel atheromatous irregularity. MRA NECK FINDINGS AORTIC ARCH: Examination somewhat limited by lack of IV contrast. Visualized aortic arch of normal caliber with normal branch pattern. No hemodynamically significant stenosis seen about the origin of the great vessels. RIGHT CAROTID SYSTEM: Right CCA patent from its origin to the bifurcation without stenosis. Mild atheromatous narrowing of no more than 20% at the origin of the right ICA at the bifurcation. Right ICA patent distally to the skull base without stenosis, evidence for dissection, or occlusion. LEFT CAROTID SYSTEM: Left CCA patent from its origin to the bifurcation without stenosis. Atheromatous narrowing of up to approximately 40% at the origin of the left ICA (series 38, image 5). Left ICA otherwise patent to the skull base without stenosis, evidence for dissection or occlusion. VERTEBRAL ARTERIES: Both vertebral arteries arise from the subclavian arteries. Vertebral arteries diminutive, although the right is slightly dominant. Vertebral arteries patent within the neck without appreciable stenosis, evidence for dissection or occlusion. IMPRESSION: MRI HEAD IMPRESSION: 1. 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 Additional patchy acute ischemic infarcts involving the left parieto-occipital region, left MCA distribution. This is superimposed on an underlying late subacute left MCA infarct at this location. Evidence for associated laminar necrosis and/or petechial hemorrhage. 3. Additional late subacute to chronic perforator type infarct  involving the posterior right basal ganglia/corona radiata. 4. Underlying multiple remote lacunar infarcts involving the left basal ganglia/corona radiata and right thalamus. 5. Tiny 2 mm subdural hematoma overlying the left frontal convexity as above, likely subacute in nature. No associated mass effect. MRA HEAD IMPRESSION: 1. Focal signal loss within a proximal left M2 branch, inferior division. Finding could reflect severe high-grade stenosis or possibly subocclusive thrombus. 2. 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 3. Severe mid left P2 stenosis. There is predominant fetal type origin of the PCAs bilaterally. 4. Extensive small vessel atheromatous irregularity throughout the intracranial circulation, advanced for age. MRA NECK IMPRESSION: 1. Mild atheromatous narrowing about the origins of both  ICAs, measuring up to 40% on the left and 20% on the right. 2. Diminutive but patent vertebral arteries within the neck. Right vertebral artery slightly dominant. Case discussed by telephone at the time of interpretation on 10/10/2020 at 9:14 pm to provider [facility], who verbally acknowledged these results. Electronically Signed   By: Rise Mu M.D.   On: 10/10/2020 21:16   Current Facility-Administered Medications (Cardiovascular):    amLODipine (NORVASC) tablet 10 mg   atorvastatin (LIPITOR) tablet 40 mg   carvedilol (COREG) tablet 3.125 mg   furosemide (LASIX) tablet 40 mg   hydrALAZINE (APRESOLINE) tablet 25 mg  Current Outpatient Medications (Cardiovascular):    amLODipine (NORVASC) 10 MG tablet, Take 1 tablet (10 mg total) by mouth daily.   atorvastatin (LIPITOR) 40 MG tablet, Take 1 tablet (40 mg total) by mouth daily.   cloNIDine (CATAPRES) 0.2 MG tablet, Take 1 tablet (0.2 mg total) by mouth 2 (two) times daily.   furosemide (LASIX) 40 MG tablet, Take 1 tablet (40 mg total) by mouth daily.   hydrochlorothiazide  (MICROZIDE) 12.5 MG capsule, Take 1 capsule (12.5 mg total) by mouth daily.   metoprolol tartrate (LOPRESSOR) 25 MG tablet, Take 0.5 tablets (12.5 mg total) by mouth 2 (two) times daily.    Current Facility-Administered Medications (Analgesics):    acetaminophen (TYLENOL) tablet 650 mg   HYDROmorphone (DILAUDID) injection 1 mg  Current Outpatient Medications (Analgesics):    acetaminophen (TYLENOL) 325 MG tablet, Take 650 mg by mouth every 6 (six) hours as needed for moderate pain or headache.   aspirin EC 81 MG EC tablet, Take 1 tablet (81 mg total) by mouth daily. Swallow whole.   oxyCODONE (OXY IR/ROXICODONE) 5 MG immediate release tablet, Take 2 tablets (10 mg total) by mouth every 6 (six) hours as needed for moderate pain or severe pain. (Patient not taking: Reported on 10/10/2020)  Current Facility-Administered Medications (Hematological):    folic acid (FOLVITE) tablet 1 mg   vitamin B-12 (CYANOCOBALAMIN) tablet 1,000 mcg  Current Outpatient Medications (Hematological):    clopidogrel (PLAVIX) 75 MG tablet, Take 1 tablet (75 mg total) by mouth daily. Take for 18 days then stop   folic acid (FOLVITE) 1 MG tablet, Take 1 tablet (1 mg total) by mouth daily.   vitamin B-12 1000 MCG tablet, Take 1 tablet (1,000 mcg total) by mouth daily.  Current Facility-Administered Medications (Other):     stroke: mapping our early stages of recovery book   alum & mag hydroxide-simeth (MAALOX/MYLANTA) 200-200-20 MG/5ML suspension 30 mL **AND** lidocaine (XYLOCAINE) 2 % viscous mouth solution 15 mL *    LORazepam (ATIVAN) tablet 1 mg   multivitamin with minerals tablet 1 tablet   pantoprazole (PROTONIX) EC tablet 40 mg  Current Outpatient Medications (Other):    Multiple Vitamin (MULTIVITAMIN WITH MINERALS) TABS tablet, Take 1 tablet by mouth daily.   pantoprazole (PROTONIX) 40 MG tablet, Take 1 tablet (40 mg total) by mouth daily.   polyethylene glycol (MIRALAX / GLYCOLAX) 17  g packet, Take 17 g by mouth daily. (Patient not taking: Reported on 10/10/2020)   senna-docusate (SENOKOT-S) 8.6-50 MG tablet, Take 2 tablets by mouth at bedtime as needed for mild constipation or moderate constipation. (Patient not taking: Reported on 10/10/2020)  Continuous Infusions:   LOS: 1 day    Gertha Calkin, MD [facility] Pager [phone] How to contact the [facility] Attending or Consulting provider 7A - 7P or covering provider during after hours 7P -7A, for this patient?  1. Check the care team in [facility] and look for a) attending/consulting [facility] provider listed and b) the [facility] team listed 2. Log into [email or web address] and use [place]'s universal password to access. If you do not have the password, please contact the hospital operator. 3. Locate the [facility] provider you are looking for under [facility] and page to a number that you can be directly reached. 4. If you still have difficulty reaching the provider, please page the [facility] (Director on Call) for the Hospitalists listed on amion for assistance. [email or web address] Password [facility] 10/12/2020, 3:39 PM

## 2020-10-12 NOTE — Progress Notes (Signed)
massage Dr Antionette Char about the report relayed from tele, that there was ST Elevation from V5 lead, asked for further management

## 2020-10-12 NOTE — Consult Note (Addendum)
Cardiology Consultation:   Patient ID: Alejandro Perez; [id number]; 12-27-1975   Admit date: 10/10/2020 Date of Consult: 10/12/2020  Primary Care Provider: [name], No Primary Cardiologist: [name], MD 12/21/2017 in-hospital Primary Electrophysiologist:  None   Patient Profile:   Alejandro Perez is a 44 y.o. male with a hx of CVA admission 11/04-11/07/2020 (had some CP during that admit), LVH, HTN, HLD, thrombocytosis 09/2019, enlarged spleen, CKD III, polysubstance abuse, who is being seen today for the evaluation of abnormal ECG at the request of Dr [name].  History of Present Illness:   [name] was discharged 11/09 after his CVA. Initially went to [facility] but fell and was unable to get help. Left and came to [facility]. During his stay here, he had continuous chest tightness, peak trop 25, EF normal on echo w/ no WMA, CT chest neg dissection, no further workup indicated in the setting of acute CVA.   Mr Alejandro Perez was admitted 11/21 with chest pain in the setting of recent cocaine use. ECG is abnl, cards asked to see.   [name] is able to answer questions, but sometimes he has trouble with word finding and his speech is slurred at times.  His significant other is in the room with him and helps explain what he saying at times.  He states that his chest pain is been continuous for 4 weeks or more.  He says that it is the same pain he was having when he was first admitted with a CVA.  It is a tightness.  He cannot think of anything that makes it worse, but sometimes it reaches as high as a 9/10, reason unclear.  Pain medication will help the chest pain, dropping it down to a 4 or 5/10.  It has not been a 0 since it started 4 weeks ago.  He has chest wall tenderness on his chest wall, to the right of the lower sternal edge.  The pain seems to be a little bit worse with deep inspiration.   Past Medical History:  Diagnosis Date  . Anemia   . Chest pain   . Glaucoma   .  Hypertension   . LVH (left ventricular hypertrophy)   . Spleen enlarged   . Stroke (HCC)   . Thrombocytopenia (HCC)     Past Surgical History:  Procedure Laterality Date  . BUBBLE STUDY  09/28/2020   Procedure: BUBBLE STUDY;  Surgeon: [name], MD;  Location: [facility] ENDOSCOPY;  Service: Cardiovascular;;  . CHOLECYSTECTOMY    . SPLENECTOMY, 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   Procedure: SPLENECTOMY;  Surgeon: [name], MD;  Location: [facility] OR;  Service: General;  Laterality: N/A;  . TEE WITHOUT CARDIOVERSION N/A 09/28/2020   Procedure: TRANSESOPHAGEAL ECHOCARDIOGRAM (TEE);  Surgeon: [name], MD;  Location: [facility] ENDOSCOPY;  Service: Cardiovascular;  Laterality: N/A;     Prior to Admission medications   Medication Sig Start Date End Date Taking? Authorizing Provider  acetaminophen (TYLENOL) 325 MG tablet Take 650 mg by mouth every 6 (six) hours as needed for moderate pain or headache.   Yes [provider]  amLODipine (NORVASC) 10 MG tablet Take 1 tablet (10 mg total) by mouth daily. 09/28/20  Yes Elgergawy, Leana Roe, MD  aspirin EC 81 MG EC tablet Take 1 tablet (81 mg total) by mouth daily. Swallow whole. 09/29/20  Yes Elgergawy, Leana Roe, MD  atorvastatin (LIPITOR) 40 MG tablet Take 1 tablet (40 mg total) by mouth daily. 09/28/20  Yes Elgergawy, Leana Roe,  MD  cloNIDine (CATAPRES) 0.2 MG tablet Take 1 tablet (0.2 mg total) by mouth 2 (two) times daily. 09/28/20  Yes Elgergawy, Leana Roe, MD  clopidogrel (PLAVIX) 75 MG tablet Take 1 tablet (75 mg total) by mouth daily. Take for 18 days then stop 09/28/20  Yes Elgergawy, Leana Roe, MD  folic acid (FOLVITE) 1 MG tablet Take 1 tablet (1 mg total) by mouth daily. 12/25/17  Yes Rhetta Mura, MD  furosemide (LASIX) 40 MG tablet Take 1 tablet (40 mg total) by mouth daily. 09/28/20  Yes Elgergawy, Leana Roe, MD  hydrochlorothiazide (MICROZIDE) 12.5 MG capsule Take 1 capsule (12.5 mg total) by mouth daily. 09/28/20  Yes Elgergawy, Leana Roe, MD    metoprolol tartrate (LOPRESSOR) 25 MG tablet Take 0.5 tablets (12.5 mg total) by mouth 2 (two) times daily. 09/28/20  Yes Elgergawy, Leana Roe, MD  Multiple Vitamin (MULTIVITAMIN WITH MINERALS) TABS tablet Take 1 tablet by mouth daily. 10/19/19  Yes Mikhail, Maryann, DO  pantoprazole (PROTONIX) 40 MG tablet Take 1 tablet (40 mg total) by mouth daily. 09/29/20  Yes Elgergawy, Leana Roe, MD  vitamin B-12 1000 MCG tablet Take 1 tablet (1,000 mcg total) by mouth daily. 12/25/17  Yes Rhetta Mura, MD  oxyCODONE (OXY IR/ROXICODONE) 5 MG immediate release tablet Take 2 tablets (10 mg total) by mouth every 6 (six) hours as needed for moderate pain or severe pain. Patient not taking: Reported on 10/10/2020 10/17/19   [name], MD  polyethylene glycol (MIRALAX / GLYCOLAX) 17 g packet Take 17 g by mouth daily. Patient not taking: Reported on 10/10/2020 10/17/19   [name], MD  senna-docusate (SENOKOT-S) 8.6-50 MG tablet Take 2 tablets by mouth at bedtime as needed for mild constipation or moderate constipation. Patient not taking: Reported on 10/10/2020 10/17/19   [name], MD    Inpatient Medications: Scheduled Meds: .  stroke: mapping our early stages of recovery book   Does not apply Once  . alum & mag hydroxide-simeth  30 mL Oral Once   And  . lidocaine  15 mL Oral Once  . amLODipine  10 mg Oral Daily  . aspirin EC  81 mg Oral Daily  . atorvastatin  40 mg Oral q1800  . carvedilol  3.125 mg Oral BID WC  . clopidogrel  75 mg Oral Daily  . folic acid  1 mg Oral Daily  . furosemide  40 mg Oral Daily  . multivitamin with minerals  1 tablet Oral Daily  . [START ON 10/13/2020] pantoprazole  40 mg Oral Daily  . sucralfate  1 g Oral TID WC & HS  . cyanocobalamin  1,000 mcg Oral Daily   Continuous Infusions:  PRN Meds: acetaminophen, hydrALAZINE, HYDROmorphone (DILAUDID) injection  Allergies:    Allergies  Allergen Reactions  . Morphine And Related Other (See Comments)     Severe abdominal pain  . Fentanyl Other (See Comments)    Mania    Social History:   Social History   Socioeconomic History  . Marital status: Single    Spouse name: Not on file  . Number of children: Not on file  . Years of education: Not on file  . Highest education level: Not on file  Occupational History  . Not on file  Tobacco Use  . Smoking status: Former Smoker    Quit date: 09/19/2020    Years since quitting: 0.0  . Smokeless tobacco: Never Used  Vaping Use  . Vaping Use: Never used  Substance and  Sexual Activity  . Alcohol use: No  . Drug use: No  . Sexual activity: Not on file  Other Topics Concern  . Not on file  Social History Narrative   Used to do Holiday representative work   Social Determinants of Corporate investment banker Strain:   . Difficulty of Paying Living Expenses: Not on file  Food Insecurity:   . Worried About Programme researcher, broadcasting/film/video in the Last Year: Not on file  . Ran Out of Food in the Last Year: Not on file  Transportation Needs:   . Lack of Transportation (Medical): Not on file  . Lack of Transportation (Non-Medical): Not on file  Physical Activity:   . Days of Exercise per Week: Not on file  . Minutes of Exercise per Session: Not on file  Stress:   . Feeling of Stress : Not on file  Social Connections:   . Frequency of Communication with Friends and Family: Not on file  . Frequency of Social Gatherings with Friends and Family: Not on file  . Attends Religious Services: Not on file  . Active Member of Clubs or Organizations: Not on file  . Attends Banker Meetings: Not on file  . Marital Status: Not on file  Intimate Partner Violence:   . Fear of Current or Ex-Partner: Not on file  . Emotionally Abused: Not on file  . Physically Abused: Not on file  . Sexually Abused: Not on file    Family History:   Family History  Problem Relation Age of Onset  . Sickle cell trait Mother    Family Status:  Family Status  Relation Name  Status  . Mother  (Not Specified)    ROS:  Please see the history of present illness.  All other ROS reviewed and negative.     Physical Exam/Data:   Vitals:   10/11/20 2000 10/12/20 0019 10/12/20 0401 10/12/20 0757  BP:  (!) 164/94 (!) 177/99 (!) 154/81  Pulse: 62 60 64 68  Resp: 20 20 18 16   Temp:  (!) 97.5 F (36.4 C) 98 F (36.7 C) 98.2 F (36.8 C)  TempSrc:  Oral Oral Oral  SpO2: 100% 100% 96% 99%  Weight:      Height:        Intake/Output Summary (Last 24 hours) at 10/12/2020 1039 Last data filed at 10/12/2020 0400 Gross per 24 hour  Intake 160 ml  Output 800 ml  Net -640 ml    Last 3 Weights 10/11/2020 10/10/2020 09/28/2020  Weight (lbs) 147 lb 11.3 oz 162 lb 156 lb 8.4 oz  Weight (kg) 67 kg 73.483 kg 71 kg     Body mass index is 18.96 kg/m.   General:  Well nourished, well developed, male in no acute distress HEENT: normal Lymph: no adenopathy Neck: JVD -not elevated Endocrine:  No thryomegaly Vascular: No carotid bruits; 4/4 extremity pulses 2+  Cardiac:  normal S1, S2; RRR ; soft murmur upper right sternal border Lungs:  clear bilaterally, no wheezing, rhonchi or rales  Abd: soft, nontender, no hepatomegaly  Ext: no edema Musculoskeletal:  No deformities, BUE and BLE strength normal and equal Skin: warm and dry  Neuro:  CNs 2-12 intact, no new abnormalities noted Psych:  Normal affect   EKG:  The EKG was personally reviewed and demonstrates:  11/23 ECG is SR, HR 60, LVH w/ J point elevation and T wave inversions, similar to 11/21 ECG and improved from 11/04-05 ECG. T wave changes  were new from 10/16/2019 ECG Telemetry:  Telemetry was personally reviewed and demonstrates:  SR, no ectopy, no Afib   CV studies:   TEE: 09/28/2020 1. Left ventricular ejection fraction, by estimation, is 55 to 60%. The left ventricle has normal function. The left ventricle has no regional wall motion abnormalities. Left ventricular diastolic function could not be  evaluated.  2. Right ventricular systolic function is normal. The right ventricular size is normal.  3. Left atrial size was mild to moderately dilated. No left atrial/left  atrial appendage thrombus was detected.  4. The mitral valve is normal in structure. Mild mitral valve  regurgitation. No evidence of mitral stenosis.  5. By PHT there is mild aortic insufficiency, but visually there appears to be moderate AI eccentrically directed in the LVOT towards the anterior MV leaflet. The aortic valve is tricuspid. Mild aortic valve sclerosis is present, with no evidence of  aortic valve stenosis.  6. The inferior vena cava is normal in size with greater than 50%  respiratory variability, suggesting right atrial pressure of 3 mmHg.  7. Agitated saline contrast bubble study was negative, with no evidence  of any interatrial shunt.   No LA/LAA thrombus identified. Negative bubble study for interatrial  shunt. No intracardiac source of embolism detected on this on this  transesophageal echocardiogram.  ECHO: 09/24/2020 1. Left ventricular ejection fraction, by estimation, is 55 to 60%. The left ventricle has normal function. The left ventricle has no regional wall motion abnormalities. There is severe left ventricular hypertrophy. Left ventricular diastolic parameters  are indeterminate.  2. Right ventricular systolic function is normal. The right ventricular  size is normal.  3. Left atrial size was mild to moderately dilated.  4. Mild mitral valve regurgitation.  5. Aortic insufficiency is eccentric, directed posterior into LV.Marland Kitchen The  aortic valve is abnormal. Aortic valve regurgitation is mild to moderate.  Mild aortic valve sclerosis is present, with no evidence of aortic valve  stenosis.  6. The inferior vena cava is normal in size with greater than 50%  respiratory variability, suggesting right atrial pressure of 3 mmHg.   Laboratory Data:   Chemistry Recent Labs  Lab  10/10/20 1328 10/11/20 1639 10/12/20 0414  NA 136 135 134*  K 3.3* 3.8 3.0*  CL 89* 96* 90*  CO2 32 24 29  GLUCOSE 90 92 98  BUN 13 14 15   CREATININE 1.87* 1.65* 1.67*  CALCIUM 10.1 9.9 10.0  GFRNONAA 44* 52* 51*  ANIONGAP 15 15 15     Lab Results  Component Value Date   ALT 93 (H) 10/12/2020   AST 66 (H) 10/12/2020   ALKPHOS 162 (H) 10/12/2020   BILITOT 1.2 10/12/2020   Hematology Recent Labs  Lab 10/10/20 1328 10/11/20 1639 10/12/20 0414  WBC 9.2 7.1 8.5  RBC 5.34 5.60 5.69  HGB 16.5 17.6* 17.6*  HCT 45.7 48.6 47.1  MCV 85.6 86.8 82.8  MCH 30.9 31.4 30.9  MCHC 36.1* 36.2* 37.4*  RDW 12.7 12.7 12.5  PLT 491* 358 501*   Cardiac Enzymes High Sensitivity Troponin:   Recent Labs  Lab 09/23/20 1407 09/23/20 2054 09/24/20 0439 10/10/20 1328 10/10/20 1552  TROPONINIHS 18* 16 25* 17 16      BNPNo results for input(s): BNP, PROBNP in the last 168 hours.   TSH:  Lab Results  Component Value Date   TSH 1.938 09/24/2020   Lipids: Lab Results  Component Value Date   CHOL 186 09/24/2020   HDL 44 09/24/2020  LDLCALC 130 (H) 09/24/2020   TRIG 62 09/24/2020   CHOLHDL 4.2 09/24/2020   HgbA1c: Lab Results  Component Value Date   HGBA1C 4.8 09/24/2020   Magnesium:  Magnesium  Date Value Ref Range Status  10/12/2020 2.1 1.7 - 2.4 mg/dL Final    Comment:    Performed at [facility], [address] [place], [place] [address]   Drugs of Abuse (UDS +cocaine 09/24/2020)    Component Value Date/Time   LABOPIA POSITIVE (A) 10/10/2020 1858   COCAINSCRNUR NONE DETECTED 10/10/2020 1858   LABBENZ NONE DETECTED 10/10/2020 1858   AMPHETMU NONE DETECTED 10/10/2020 1858   THCU POSITIVE (A) 10/10/2020 1858   LABBARB NONE DETECTED 10/10/2020 1858      Radiology/Studies:  DG Chest 2 View  Result Date: 10/10/2020 CLINICAL DATA:  Chest pain and headache 4 days. EXAM: CHEST - 2 VIEW COMPARISON:  09/25/2020 and CT 09/23/2020 FINDINGS: Lungs are adequately  inflated without focal airspace consolidation or effusion. Calcified granuloma over the right middle lobe. Cardiomediastinal silhouette and remainder of the exam is unchanged. IMPRESSION: No active cardiopulmonary disease. Electronically Signed   By: Elberta Fortis M.D.   On: 10/10/2020 14:30   CT Head Wo Contrast  Result Date: 10/10/2020 CLINICAL DATA:  Headache, recent stroke EXAM: CT HEAD WITHOUT CONTRAST TECHNIQUE: Contiguous axial images were obtained from the base of the skull through the vertex without intravenous contrast. COMPARISON:  09/25/2020 FINDINGS: Brain: New hypoattenuation along the ventral body of the corpus callosum eccentric to the left. Evolving recent infarcts of the posterior left MCA territory and right corona radiata. Chronic infarcts of the left corona radiata and right thalamus. No acute intracranial hemorrhage or significant mass effect. No hydrocephalus or extra-axial collection. Vascular: No hyperdense vessel. There is atherosclerotic calcification at the skull base. Skull: Calvarium is unremarkable. Sinuses/Orbits: No acute finding. Other: None. IMPRESSION: New hypoattenuation along the ventral body of the corpus callosum eccentric to the left suspicious for acute or subacute infarction. Evolving recent infarcts of the posterior left MCA territory and right corona radiata. No acute intracranial hemorrhage. These results were called by telephone at the time of interpretation on 10/10/2020 at 4:47 pm to provider Trudee Grip, who verbally acknowledged these results. Electronically Signed   By: Guadlupe Spanish M.D.   On: 10/10/2020 16:47   MR ANGIO HEAD WO CONTRAST  Result Date: 10/10/2020 CLINICAL DATA:  Initial evaluation for neuro deficit, stroke suspected. EXAM: MRI HEAD WITHOUT CONTRAST MRA HEAD WITHOUT CONTRAST MRA NECK WITHOUT CONTRAST TECHNIQUE: Multiplanar, multiecho pulse sequences of the brain and surrounding structures were obtained without intravenous contrast.  Angiographic images of the Circle of Willis were obtained using MRA technique without intravenous contrast. Angiographic images of the neck were obtained using MRA technique without intravenous contrast. Carotid stenosis measurements (when applicable) are obtained utilizing NASCET criteria, using the distal internal carotid diameter as the denominator. COMPARISON:  Prior CT from earlier same day as well as earlier studies. FINDINGS: MRI HEAD FINDINGS Brain: Cerebral volume within normal limits for age. Multiple scattered remote lacunar infarcts noted about the left basal ganglia/corona radiata and right thalamus. Perforator type lacunar infarct involving the posterior right basal ganglia/corona radiata demonstrates mild residual diffusion abnormality without ADC correlate, consistent with a late subacute ischemic infarct (series 5, image 83). No associated hemorrhage or mass effect. Confluent area of restricted diffusion involving the body of the left corpus callosum measures 4.3 x 1.7 cm, consistent with an acute left ACA territory infarct (series 5,  image 83). Mild diffusion abnormality/ischemia noted within the contralateral right corpus callosum as well (series 6, image 34). No associated hemorrhage or mass effect. Patchy and confluent restricted diffusion involving the left parieto-occipital region is seen (series 5, image 84). Associated signal loss seen on ADC map (series 6, image 34), consistent with an acute left MCA distribution infarct. 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 Cortically based T1 hyperintensity and susceptibility artifact consistent with laminar necrosis and/or petechial hemorrhage. No significant regional mass effect. Additional punctate acute ischemic nonhemorrhagic infarct noted anteriorly within the left frontal centrum semi ovale (series 5, image 90). No other evidence for acute or subacute ischemia. Gray-white matter  differentiation otherwise maintained. No mass lesion or midline shift. No hydrocephalus. Subtle 2 mm FLAIR hyperintensity with associated susceptibility artifacts seen overlying the left frontal convexity, consistent with a small subdural hematoma, likely subacute in nature (series 9, image 16). No associated mass effect. Additional faint component noted posteriorly at the left parietal convexity. Pituitary gland suprasellar region normal. Vascular: Major intracranial vascular flow voids are maintained. Skull and upper cervical spine: Craniocervical junction normal. Bone marrow signal intensity within normal limits. No visible scalp soft tissue injury. Sinuses/Orbits: Globes and orbital soft tissues grossly within normal limits. Scattered mucosal thickening noted within the ethmoidal air cells and maxillary sinuses. Paranasal sinuses are otherwise clear. No significant mastoid effusion. Inner ear structures normal. Other: None. MRA HEAD FINDINGS ANTERIOR CIRCULATION: Examination mildly degraded by motion artifact. Visualized distal cervical segments of the internal carotid arteries are patent with antegrade flow. Petrous segments patent bilaterally. Atheromatous irregularity within the cavernous/supraclinoid segments without high-grade stenosis. A1 segments patent bilaterally. Normal anterior communicating artery complex. There are severe near occlusive stenoses involving the proximal A2 segments bilaterally (series 18, image 114 on the right, series 18, image 117 on the left). Additional severe downstream 8 3 stenosis (series 18, image 152). ACAs remain perfused to their distal aspects at this time. No M1 stenosis or occlusion. Negative MCA bifurcations. Distal right MCA branches remain perfused, although demonstrate advanced small vessel atheromatous irregularity for age. On the left, there is focal signal loss within a proximal left M2 branch, inferior division (series 18, image 109). Finding could reflect severe  high-grade stenosis or possibly subocclusive thrombus. Left MCA branches otherwise perfused, although demonstrate advanced age-related small vessel atheromatous irregularity. POSTERIOR CIRCULATION: Visualized vertebral arteries patent to the vertebrobasilar junction without stenosis. Left PICA patent proximally. Basilar patent to its distal aspect without stenosis. Superior cerebral arteries patent bilaterally. Predominant fetal type origin of the PCAs bilaterally. There is a severe near occlusive stenosis involving the mid left P2 segment (series 26, image 8). PCAs otherwise patent without proximal high-grade stenosis, although demonstrate extensive distal small vessel atheromatous irregularity. MRA NECK FINDINGS AORTIC ARCH: Examination somewhat limited by lack of IV contrast. Visualized aortic arch of normal caliber with normal branch pattern. No hemodynamically significant stenosis seen about the origin of the great vessels. RIGHT CAROTID SYSTEM: Right CCA patent from its origin to the bifurcation without stenosis. Mild atheromatous narrowing of no more than 20% at the origin of the right ICA at the bifurcation. Right ICA patent distally to the skull base without stenosis, evidence for dissection, or occlusion. LEFT CAROTID SYSTEM: Left CCA patent from its origin to the bifurcation without stenosis. Atheromatous narrowing of up to approximately 40% at the origin of the left ICA (series 38, image 5). Left ICA otherwise patent to the skull base without stenosis,  evidence for dissection or occlusion. VERTEBRAL ARTERIES: Both vertebral arteries arise from the subclavian arteries. Vertebral arteries diminutive, although the right is slightly dominant. Vertebral arteries patent within the neck without appreciable stenosis, evidence for dissection or occlusion. IMPRESSION: MRI HEAD IMPRESSION: 1. 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 Additional patchy  acute ischemic infarcts involving the left parieto-occipital region, left MCA distribution. This is superimposed on an underlying late subacute left MCA infarct at this location. Evidence for associated laminar necrosis and/or petechial hemorrhage. 3. 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 4. Underlying multiple remote lacunar infarcts involving the left basal ganglia/corona radiata and right thalamus. 5. Tiny 2 mm subdural hematoma overlying the left frontal convexity as above, likely subacute in nature. No associated mass effect. MRA HEAD IMPRESSION: 1. Focal signal loss within a proximal left M2 branch, inferior division. Finding could reflect severe high-grade stenosis or possibly subocclusive thrombus. 2. 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 3. Severe mid left P2 stenosis. There is predominant fetal type origin of the PCAs bilaterally. 4. Extensive small vessel atheromatous irregularity throughout the intracranial circulation, advanced for age. MRA NECK IMPRESSION: 1. Mild atheromatous narrowing about the origins of both ICAs, measuring up to 40% on the left and 20% on the right. 2. Diminutive but patent vertebral arteries within the neck. Right vertebral artery slightly dominant. Case discussed by telephone at the time of interpretation on 10/10/2020 at 9:14 pm to provider [facility] Treatment[facility] AR[facility], who verbally acknowledged these results. Electronically Signed   By: Rise MuBenjamin  McClintock M.D.   On: 10/10/2020 21:16   MR ANGIO NECK WO CONTRAST  Result Date: 10/10/2020 CLINICAL DATA:  Initial evaluation for neuro deficit, stroke suspected. EXAM: MRI HEAD WITHOUT CONTRAST MRA HEAD WITHOUT CONTRAST MRA NECK WITHOUT CONTRAST TECHNIQUE: Multiplanar, multiecho pulse sequences of the brain and surrounding structures were obtained without intravenous contrast. Angiographic images of the Circle  of Willis were obtained using MRA technique without intravenous contrast. Angiographic images of the neck were obtained using MRA technique without intravenous contrast. Carotid stenosis measurements (when applicable) are obtained utilizing NASCET criteria, using the distal internal carotid diameter as the denominator. COMPARISON:  Prior CT from earlier same day as well as earlier studies. FINDINGS: MRI HEAD FINDINGS Brain: Cerebral volume within normal limits for age. Multiple scattered remote lacunar infarcts noted about the left basal ganglia/corona radiata and right thalamus. Perforator type lacunar infarct involving the posterior right basal ganglia/corona radiata demonstrates mild residual diffusion abnormality without ADC correlate, consistent with a late subacute ischemic infarct (series 5, image 83). No associated hemorrhage or mass effect. Confluent area of restricted diffusion involving the body of the left corpus callosum measures 4.3 x 1.7 cm, consistent with an acute left ACA territory infarct (series 5, image 83). Mild diffusion abnormality/ischemia noted within the contralateral right corpus callosum as well (series 6, image 34). No associated hemorrhage or mass effect. Patchy and confluent restricted diffusion involving the left parieto-occipital region is seen (series 5, image 84). Associated signal loss seen on ADC map (series 6, image 34), consistent with an acute left MCA distribution infarct. 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 Cortically based T1 hyperintensity and susceptibility artifact consistent with laminar necrosis and/or petechial hemorrhage. No significant regional mass effect. Additional punctate acute ischemic nonhemorrhagic infarct noted anteriorly within the left frontal centrum semi  ovale (series 5, image 90). No other evidence for acute or subacute ischemia. Gray-white matter differentiation otherwise maintained. No  mass lesion or midline shift. No hydrocephalus. Subtle 2 mm FLAIR hyperintensity with associated susceptibility artifacts seen overlying the left frontal convexity, consistent with a small subdural hematoma, likely subacute in nature (series 9, image 16). No associated mass effect. Additional faint component noted posteriorly at the left parietal convexity. Pituitary gland suprasellar region normal. Vascular: Major intracranial vascular flow voids are maintained. Skull and upper cervical spine: Craniocervical junction normal. Bone marrow signal intensity within normal limits. No visible scalp soft tissue injury. Sinuses/Orbits: Globes and orbital soft tissues grossly within normal limits. Scattered mucosal thickening noted within the ethmoidal air cells and maxillary sinuses. Paranasal sinuses are otherwise clear. No significant mastoid effusion. Inner ear structures normal. Other: None. MRA HEAD FINDINGS ANTERIOR CIRCULATION: Examination mildly degraded by motion artifact. Visualized distal cervical segments of the internal carotid arteries are patent with antegrade flow. Petrous segments patent bilaterally. Atheromatous irregularity within the cavernous/supraclinoid segments without high-grade stenosis. A1 segments patent bilaterally. Normal anterior communicating artery complex. There are severe near occlusive stenoses involving the proximal A2 segments bilaterally (series 18, image 114 on the right, series 18, image 117 on the left). Additional severe downstream 8 3 stenosis (series 18, image 152). ACAs remain perfused to their distal aspects at this time. No M1 stenosis or occlusion. Negative MCA bifurcations. Distal right MCA branches remain perfused, although demonstrate advanced small vessel atheromatous irregularity for age. On the left, there is focal signal loss within a proximal left M2 branch, inferior division (series 18, image 109). Finding could reflect severe high-grade stenosis or possibly  subocclusive thrombus. Left MCA branches otherwise perfused, although demonstrate advanced age-related small vessel atheromatous irregularity. POSTERIOR CIRCULATION: Visualized vertebral arteries patent to the vertebrobasilar junction without stenosis. Left PICA patent proximally. Basilar patent to its distal aspect without stenosis. Superior cerebral arteries patent bilaterally. Predominant fetal type origin of the PCAs bilaterally. There is a severe near occlusive stenosis involving the mid left P2 segment (series 26, image 8). PCAs otherwise patent without proximal high-grade stenosis, although demonstrate extensive distal small vessel atheromatous irregularity. MRA NECK FINDINGS AORTIC ARCH: Examination somewhat limited by lack of IV contrast. Visualized aortic arch of normal caliber with normal branch pattern. No hemodynamically significant stenosis seen about the origin of the great vessels. RIGHT CAROTID SYSTEM: Right CCA patent from its origin to the bifurcation without stenosis. Mild atheromatous narrowing of no more than 20% at the origin of the right ICA at the bifurcation. Right ICA patent distally to the skull base without stenosis, evidence for dissection, or occlusion. LEFT CAROTID SYSTEM: Left CCA patent from its origin to the bifurcation without stenosis. Atheromatous narrowing of up to approximately 40% at the origin of the left ICA (series 38, image 5). Left ICA otherwise patent to the skull base without stenosis, evidence for dissection or occlusion. VERTEBRAL ARTERIES: Both vertebral arteries arise from the subclavian arteries. Vertebral arteries diminutive, although the right is slightly dominant. Vertebral arteries patent within the neck without appreciable stenosis, evidence for dissection or occlusion. IMPRESSION: MRI HEAD IMPRESSION: 1. 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 Additional patchy acute ischemic infarcts  involving the left parieto-occipital region, left MCA distribution. This is superimposed on an underlying late subacute left MCA infarct at this location. Evidence for associated laminar necrosis and/or petechial hemorrhage. 3. Additional late subacute to chronic perforator type infarct involving the  posterior right basal ganglia/corona radiata. 4. Underlying multiple remote lacunar infarcts involving the left basal ganglia/corona radiata and right thalamus. 5. Tiny 2 mm subdural hematoma overlying the left frontal convexity as above, likely subacute in nature. No associated mass effect. MRA HEAD IMPRESSION: 1. Focal signal loss within a proximal left M2 branch, inferior division. Finding could reflect severe high-grade stenosis or possibly subocclusive thrombus. 2. 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 3. Severe mid left P2 stenosis. There is predominant fetal type origin of the PCAs bilaterally. 4. Extensive small vessel atheromatous irregularity throughout the intracranial circulation, advanced for age. MRA NECK IMPRESSION: 1. Mild atheromatous narrowing about the origins of both ICAs, measuring up to 40% on the left and 20% on the right. 2. Diminutive but patent vertebral arteries within the neck. Right vertebral artery slightly dominant. Case discussed by telephone at the time of interpretation on 10/10/2020 at 9:14 pm to provider [facility], who verbally acknowledged these results. Electronically Signed   By: Rise Mu M.D.   On: 10/10/2020 21:16   MR BRAIN WO CONTRAST  Result Date: 10/10/2020 CLINICAL DATA:  Initial evaluation for neuro deficit, stroke suspected. EXAM: MRI HEAD WITHOUT CONTRAST MRA HEAD WITHOUT CONTRAST MRA NECK WITHOUT CONTRAST TECHNIQUE: Multiplanar, multiecho pulse sequences of the brain and surrounding structures were obtained without intravenous contrast. Angiographic images of the Circle of Willis were obtained using  MRA technique without intravenous contrast. Angiographic images of the neck were obtained using MRA technique without intravenous contrast. Carotid stenosis measurements (when applicable) are obtained utilizing NASCET criteria, using the distal internal carotid diameter as the denominator. COMPARISON:  Prior CT from earlier same day as well as earlier studies. FINDINGS: MRI HEAD FINDINGS Brain: Cerebral volume within normal limits for age. Multiple scattered remote lacunar infarcts noted about the left basal ganglia/corona radiata and right thalamus. Perforator type lacunar infarct involving the posterior right basal ganglia/corona radiata demonstrates mild residual diffusion abnormality without ADC correlate, consistent with a late subacute ischemic infarct (series 5, image 83). No associated hemorrhage or mass effect. Confluent area of restricted diffusion involving the body of the left corpus callosum measures 4.3 x 1.7 cm, consistent with an acute left ACA territory infarct (series 5, image 83). Mild diffusion abnormality/ischemia noted within the contralateral right corpus callosum as well (series 6, image 34). No associated hemorrhage or mass effect. Patchy and confluent restricted diffusion involving the left parieto-occipital region is seen (series 5, image 84). Associated signal loss seen on ADC map (series 6, image 34), consistent with an acute left MCA distribution infarct. 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 Cortically based T1 hyperintensity and susceptibility artifact consistent with laminar necrosis and/or petechial hemorrhage. No significant regional mass effect. Additional punctate acute ischemic nonhemorrhagic infarct noted anteriorly within the left frontal centrum semi ovale (series 5, image 90). No other evidence for acute or subacute ischemia. Gray-white matter differentiation otherwise maintained. No mass lesion or midline shift. No  hydrocephalus. Subtle 2 mm FLAIR hyperintensity with associated susceptibility artifacts seen overlying the left frontal convexity, consistent with a small subdural hematoma, likely subacute in nature (series 9, image 16). No associated mass effect. Additional faint component noted posteriorly at the left parietal convexity. Pituitary gland suprasellar region normal. Vascular: Major intracranial vascular flow voids are maintained. Skull and upper cervical spine: Craniocervical junction normal. Bone marrow signal intensity within normal limits. No visible scalp soft tissue injury. Sinuses/Orbits: Globes and  orbital soft tissues grossly within normal limits. Scattered mucosal thickening noted within the ethmoidal air cells and maxillary sinuses. Paranasal sinuses are otherwise clear. No significant mastoid effusion. Inner ear structures normal. Other: None. MRA HEAD FINDINGS ANTERIOR CIRCULATION: Examination mildly degraded by motion artifact. Visualized distal cervical segments of the internal carotid arteries are patent with antegrade flow. Petrous segments patent bilaterally. Atheromatous irregularity within the cavernous/supraclinoid segments without high-grade stenosis. A1 segments patent bilaterally. Normal anterior communicating artery complex. There are severe near occlusive stenoses involving the proximal A2 segments bilaterally (series 18, image 114 on the right, series 18, image 117 on the left). Additional severe downstream 8 3 stenosis (series 18, image 152). ACAs remain perfused to their distal aspects at this time. No M1 stenosis or occlusion. Negative MCA bifurcations. Distal right MCA branches remain perfused, although demonstrate advanced small vessel atheromatous irregularity for age. On the left, there is focal signal loss within a proximal left M2 branch, inferior division (series 18, image 109). Finding could reflect severe high-grade stenosis or possibly subocclusive thrombus. Left MCA branches  otherwise perfused, although demonstrate advanced age-related small vessel atheromatous irregularity. POSTERIOR CIRCULATION: Visualized vertebral arteries patent to the vertebrobasilar junction without stenosis. Left PICA patent proximally. Basilar patent to its distal aspect without stenosis. Superior cerebral arteries patent bilaterally. Predominant fetal type origin of the PCAs bilaterally. There is a severe near occlusive stenosis involving the mid left P2 segment (series 26, image 8). PCAs otherwise patent without proximal high-grade stenosis, although demonstrate extensive distal small vessel atheromatous irregularity. MRA NECK FINDINGS AORTIC ARCH: Examination somewhat limited by lack of IV contrast. Visualized aortic arch of normal caliber with normal branch pattern. No hemodynamically significant stenosis seen about the origin of the great vessels. RIGHT CAROTID SYSTEM: Right CCA patent from its origin to the bifurcation without stenosis. Mild atheromatous narrowing of no more than 20% at the origin of the right ICA at the bifurcation. Right ICA patent distally to the skull base without stenosis, evidence for dissection, or occlusion. LEFT CAROTID SYSTEM: Left CCA patent from its origin to the bifurcation without stenosis. Atheromatous narrowing of up to approximately 40% at the origin of the left ICA (series 38, image 5). Left ICA otherwise patent to the skull base without stenosis, evidence for dissection or occlusion. VERTEBRAL ARTERIES: Both vertebral arteries arise from the subclavian arteries. Vertebral arteries diminutive, although the right is slightly dominant. Vertebral arteries patent within the neck without appreciable stenosis, evidence for dissection or occlusion. IMPRESSION: MRI HEAD IMPRESSION: 1. 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 Additional patchy acute ischemic infarcts involving the left parieto-occipital region,  left MCA distribution. This is superimposed on an underlying late subacute left MCA infarct at this location. Evidence for associated laminar necrosis and/or petechial hemorrhage. 3. 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 4. Underlying multiple remote lacunar infarcts involving the left basal ganglia/corona radiata and right thalamus. 5. Tiny 2 mm subdural hematoma overlying the left frontal convexity as above, likely subacute in nature. No associated mass effect. MRA HEAD IMPRESSION: 1. Focal signal loss within a proximal left M2 branch, inferior division. Finding could reflect severe high-grade stenosis or possibly subocclusive thrombus. 2. 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 3. Severe mid left P2 stenosis. There is predominant fetal type origin of the PCAs bilaterally. 4. Extensive small vessel atheromatous irregularity throughout the intracranial circulation,  advanced for age. MRA NECK IMPRESSION: 1. Mild atheromatous narrowing about the origins of both ICAs, measuring up to 40% on the left and 20% on the right. 2. Diminutive but patent vertebral arteries within the neck. Right vertebral artery slightly dominant. Case discussed by telephone at the time of interpretation on 10/10/2020 at 9:14 pm to provider [facility], who verbally acknowledged these results. Electronically Signed   By: Rise Mu M.D.   On: 10/10/2020 21:16    Assessment and Plan:   1.  Chest pain, abnormal ECG -Mr. Chermak has had chest pain continuously for months.  His peak troponin is 25. -His pain is improved by pain medications and he has chest wall tenderness. -His EF is normal with no wall motion abnormalities -His ECG is abnormal, but is consistent with LVH and is improved from his admission ECG 09/23/2020. -MD advise if ischemic eval such as cardiac CT is indicated at this time. -  Otherwise, we can follow him as an outpatient and decide on an ischemic eval once he has further recovered from his stroke.  2.  History of acute left MCA infarct, embolic, secondary to uncontrolled risk factors of hypertension, hyperlipidemia, and cocaine use -He was discharged 09/28/2020 after CVA. - he has mild atheromatous narrowing about the origins of both ICAs, but no carotid bruits are appreciated. -Continue Lipitor 40 mg daily - he has completed 3 weeks of Plavix and aspirin, he is now on aspirin alone -No atrial fibrillation has been seen on the monitor. -He had a TEE during his stroke admission, but no loop recorder was placed -Management per IM/Neurology  3.  Hypokalemia: -His potassium is 3.0 this morning, he has 2 IV runs of potassium ordered for today, and had 2 yesterday. -On admission to [facility] 11/04, he was taking Lasix 40 mg twice daily. -This was cut back to 40 mg daily at discharge and HCTZ 12.5 mg daily was added. - His potassium was 4.2 on admission 11/4, medication compliance is unclear  4.  History of polysubstance abuse: -He has a history of THC, cocaine, and opiate use. -This admission, he has tested positive only for opiates and THC. -On his previous drug screen, done when he was admitted for his CVA, he tested positive for THC, cocaine and opiates -Cessation encouraged  5.  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 -Peak platelet count 1149 -Platelets have been essentially normal since then, but are elevated this admission. -At this time, he is not anemic and liver functions are normal -Management per IM   Active Problems:   Chest pain   Acute ischemic stroke [facility])     For questions or updates, please contact [facility] Please consult [email or web address] for contact info under Cardiology/STEMI.    Signed, Theodore Demark, PA-C  10/12/2020 10:39 AM   History and all data above reviewed.  Patient examined.  I agree with the findings as above.  The patient has a significant depressive aphasia but reports chest discomfort.  He subsequently came in with a headache which has been constant.  He does have intermittent 5 out of 10 chest discomfort.  However, this has atypical greater than typical features.  He describes it as sharp.  Seems to be intermittent.  It might be positional.  He is not having any jaw or arm pain.  He comes and goes spontaneously.  He can walk around and do activities around his house and  not bring on the symptoms.  Troponin was not diagnostic.  His EKG shows LVH with repolarization and is unchanged from previous.  The patient exam reveals COR: Regular rate and rhythm, no murmurs, rubs,  Lungs: Clear to auscultation bilaterally,  Abd: Positive bowel sounds normal in frequency in pitch, no bruits, rebound, guarding, mild tenderness in the left lower quadrant, Ext 2+ pulses, no edema.  All available labs, radiology testing, previous records reviewed. Agree with documented assessment and plan.  Chest discomfort: The patient's chest discomfort is nonanginal likely.  The EKG is not diagnostic and is consistent with LVH with repolarization.  He does have significant cardiovascular risk factors.  However, I think further cardiovascular evaluation can take place as an outpatient.  HTN:  Allow some permissive HTN.     Fayrene Fearing Tyah Acord  2:59 PM  10/12/2020

## 2020-10-12 NOTE — Evaluation (Signed)
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 Current MRI also shows L ACA. PMH: anemia, HTN, polysubstance abuse  Clinical Impression  Pt admitted with above diagnosis. Pt's outpt PT had not begun (waiting on Medicaid approval) between last admission and this one. Pt with increased expressive deficits and very frustrated by this. Pt ambulated 250' without AD with min A due to narrow base and imbalance with distractions, head turns, and multitasking. Pt ascended 5 steps with hand rails without difficulty. Pt still appropriate for outpt PT upon d/c.  Pt currently with functional limitations due to the deficits listed below (see PT Problem List). Pt will benefit from skilled PT to increase their independence and safety with mobility to allow discharge to the venue listed below.      Follow Up Recommendations Outpatient PT;Supervision for mobility/OOB    Equipment Recommendations  None recommended by PT    Recommendations for Other Services       Precautions / Restrictions Precautions Precautions: Fall Precaution Comments: legally blind  Restrictions Weight Bearing Restrictions: No      Mobility  Bed Mobility Overal bed mobility: Modified Independent Bed Mobility: Supine to Sit     Supine to sit: Modified independent (Device/Increase time)     General bed mobility comments: mod I but increased time needed for sequencing    Transfers Overall transfer level: Needs assistance Equipment used: None Transfers: Sit to/from Stand Sit to Stand: Min assist         General transfer comment: min A for safety  Ambulation/Gait Ambulation/Gait assistance: Min assist Gait Distance (Feet): 250 Feet Assistive device:  None Gait Pattern/deviations: Narrow base of support;Drifts right/left Gait velocity: decreased Gait velocity interpretation: 1.31 - 2.62 ft/sec, indicative of limited community ambulator General Gait Details: pt with narrow base and occasional dyscoordination of RLE leading to drifting but no LOB on even, level surface. With head turns, pt had mild LOB with self correction. Min A given for safety.   Stairs Stairs: Yes Stairs assistance: Supervision Stair Management: Two rails;Alternating pattern;Forwards Number of Stairs: 5 General stair comments: safe on stairs with rail  Wheelchair Mobility    Modified Rankin (Stroke Patients Only) Modified Rankin (Stroke Patients Only) Pre-Morbid Rankin Score: Moderately severe disability Modified Rankin: Moderately severe disability     Balance Overall balance assessment: Needs assistance Sitting-balance support: No upper extremity supported;Feet supported Sitting balance-Leahy Scale: Good Sitting balance - Comments: able to reach out of BOS without LOB   Standing balance support: No upper extremity supported Standing balance-Leahy Scale: Fair Standing balance comment: able to perform dynamic activity within BOS without LOB, limitations seen when pt distracted or trying to multitask                             Pertinent Vitals/Pain Pain Assessment: No/denies pain    Home Living Family/patient expects to be discharged to:: Private residence Living Arrangements: Spouse/significant other Available Help at Discharge: Family Type of Home: Mobile home Home Access: Stairs to enter   Entrance Stairs-Number of Steps: 5 Home Layout: One level Home Equipment: None Additional Comments: spouse, sister when spouse at work     Prior Function Level of Independence: Needs assistance  Gait / Transfers Assistance Needed: supervision with ambulation since last admission and wife reports he wasn't amulating much  ADL's / Homemaking  Assistance Needed: wife assisting with bathing and dressing  Comments: prior to last admit: on disability, does not drive, independent ADLs/IADLS      Hand Dominance   Dominant Hand: Right    Extremity/Trunk Assessment   Upper Extremity Assessment Upper Extremity Assessment: Defer to OT evaluation RUE Deficits / Details: pt donned both shoes in long sitting in bed    Lower Extremity Assessment Lower Extremity Assessment: RLE deficits/detail RLE Deficits / Details: strength WFL but noted poor coordination during gait RLE Sensation: decreased proprioception RLE Coordination: decreased gross motor    Cervical / Trunk Assessment Cervical / Trunk Assessment: Normal  Communication   Communication: Expressive difficulties  Cognition Arousal/Alertness: Awake/alert Behavior During Therapy: WFL for tasks assessed/performed Overall Cognitive Status: Impaired/Different from baseline Area of Impairment: Safety/judgement;Problem solving;Memory                     Memory: Decreased short-term memory   Safety/Judgement: Decreased awareness of safety;Decreased awareness of deficits   Problem Solving: Difficulty sequencing;Requires verbal cues General Comments: cognitive change seems minimal since last admission but expressive aphasia is worsened and he is easily frustrated with this      General Comments General comments (skin integrity, edema, etc.): VSS. Pt's wife present. Worked on pathfinding back to room and pt had some difficulty with this. Partly due to visual deficits as well as problem solving deficits.     Exercises     Assessment/Plan    PT Assessment Patient needs continued PT services  PT Problem List Decreased strength;Decreased balance;Decreased mobility;Decreased coordination;Decreased cognition;Decreased safety awareness       PT Treatment Interventions DME instruction;Gait training;Stair training;Functional mobility training;Therapeutic  activities;Therapeutic exercise;Balance training;Neuromuscular re-education;Cognitive remediation;Patient/family education    PT Goals (Current goals can be found in the Care Plan section)  Acute Rehab PT Goals Patient Stated Goal: home PT Goal Formulation: With patient Time For Goal Achievement: 10/26/20 Potential to Achieve Goals: Good Additional Goals Additional Goal #1: Pt to perform >19/26 on DGI to improve independence with gait    Frequency Min 4X/week   Barriers to discharge        Co-evaluation               AM-PAC PT "6 Clicks" Mobility  Outcome Measure Help needed turning from your back to your side while in a flat bed without using bedrails?: None Help needed moving from lying on your back to sitting on the side of a flat bed without using bedrails?: None Help needed moving to and from a bed to a chair (including a wheelchair)?: None Help needed standing up from a chair using your arms (e.g., wheelchair or bedside chair)?: A Little Help needed to walk in hospital room?: A Little Help needed climbing 3-5 steps with a railing? : A Little 6 Click Score: 21    End of Session Equipment Utilized During Treatment: Gait belt Activity Tolerance: Patient tolerated treatment well Patient left: in chair;with call bell/phone within reach;with family/visitor present Nurse Communication: Mobility status PT Visit Diagnosis: Other abnormalities of gait and mobility (R26.89);Muscle weakness (generalized) (M62.81)    Time: 9379-0240 PT Time Calculation (min) (ACUTE ONLY): 22 min   Charges:   PT Evaluation $PT Eval Moderate Complexity: 1 Mod          Lyanne Co, PT  Acute Rehab Services  Pager [phone] Office [phone]  [place] L Anjalina Bergevin 10/12/2020, 4:11 PM

## 2020-10-12 NOTE — Progress Notes (Signed)
OT Cancellation Note  Patient Details Name: Alejandro Perez MRN: [id number] DOB: Jul 21, 1976   Cancelled Treatment:    Reason Eval/Treat Not Completed: Patient declined, no reason specified Pt states "no I am not" pt expressed feeling tired. Pt introduced visitor in room as wife  Wynona Neat, OTR/L  Acute Rehabilitation Services Pager: [phone] Office: [phone] .  10/12/2020, 1:15 PM

## 2020-10-12 NOTE — Progress Notes (Signed)
Spoke with Dr Antionette Char, update pt condition and complain of chest and head pain, informed that dilaudid given 2x already and pain will subside a little, and th epain was already compain since admission, Dr Antionette Char ordered to do 12 lead ECG

## 2020-10-12 NOTE — Progress Notes (Signed)
  Speech Language Pathology Treatment: Cognitive-Linquistic  Patient Details Name: Alejandro Perez MRN: [id number] DOB: Apr 14, 1976 Today's Date: 10/12/2020 Time: 5462-7035 SLP Time Calculation (min) (ACUTE ONLY): 17 min  Assessment / Plan / Recommendation Clinical Impression  Pt was seen for cognitive-linguistic treatment in order to address expressive aphasia. His spontaneous speech continues to be effortful and paraphasic with word-finding difficulty. He frequently demonstrated both semantic and phonemic paraphasias and produces multiple approximations of a word before achieving his target. During a task targeting confrontational, convergent, and divergent naming, the pt demonstrated 100% accuracy with min verbal cues from the SLP. During a task targeting increased utterance length, the SLP extended the patient's productions in order to increase grammatic complexity. He correctly stated short phrases with about 25% accuracy and direct modeling from the SLP. Further sessions should continue to address the pt's expressive aphasia, specifically at the phrase and sentence level. SLP will continue to f/u acutely.  HPI HPI:  Alejandro Perez is a 44 y.o. male with medical history significant of recent stroke left MCA and subacute right corona radiata and other chronic lacunar infarct, HTN, LVH, remote history of TTP, status post splenectomy, CKD stage II. MRI head revealed acute ischemic nonhemorrhagic left ACA territory infarct      SLP Plan  Continue with current plan of care       Recommendations                   Oral Care Recommendations: Oral care BID Follow up Recommendations: Inpatient Rehab;Outpatient SLP;Home health SLP SLP Visit Diagnosis: Aphasia (R47.01) Plan: Continue with current plan of care       GO                Royetta Crochet 10/12/2020, 3:00 PM

## 2020-10-13 ENCOUNTER — Inpatient Hospital Stay (HOSPITAL_COMMUNITY): Payer: Medicaid - Out of State

## 2020-10-13 DIAGNOSIS — F1920 Other psychoactive substance dependence, uncomplicated: Secondary | ICD-10-CM

## 2020-10-13 DIAGNOSIS — F1120 Opioid dependence, uncomplicated: Secondary | ICD-10-CM | POA: Diagnosis not present

## 2020-10-13 DIAGNOSIS — R079 Chest pain, unspecified: Secondary | ICD-10-CM | POA: Diagnosis not present

## 2020-10-13 DIAGNOSIS — I10 Essential (primary) hypertension: Secondary | ICD-10-CM | POA: Diagnosis not present

## 2020-10-13 IMAGING — CT CT HEAD W/O CM
2 of 3 series · 14 of 47 positions shown, 17 images · non-contrast
Comparison: MRI head and plain brain CT, dated [DATE].

CLINICAL DATA: Status post fall.

EXAM:
CT HEAD WITHOUT CONTRAST
TECHNIQUE: Contiguous axial images were obtained from the base of the skull
through the vertex without intravenous contrast.

[Series 3: head 5.0 h30s · axial · 0.46mm/px · z∈[-87,+58]mm · 11 of 35 slices shown, 14 images]
[im 3/35  brain]
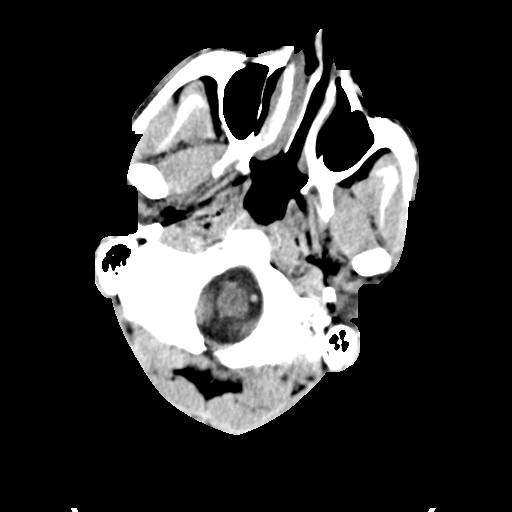
[im 3/35  bone]
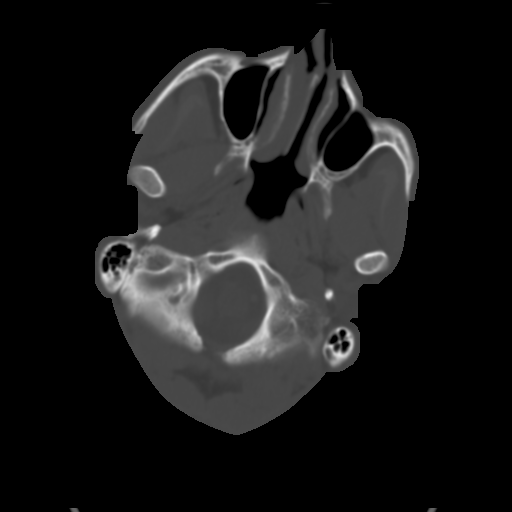
[im 5/35  brain]
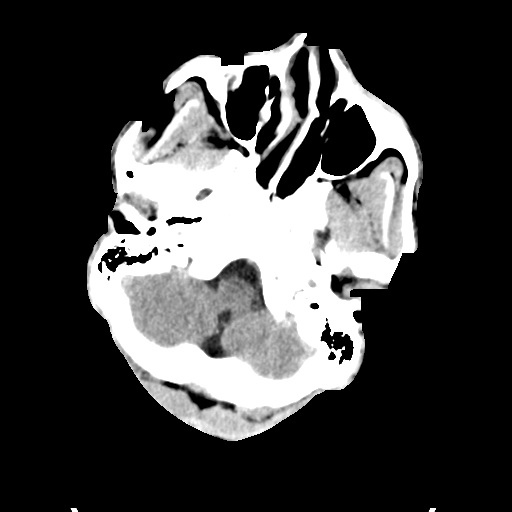
[im 9/35  brain]
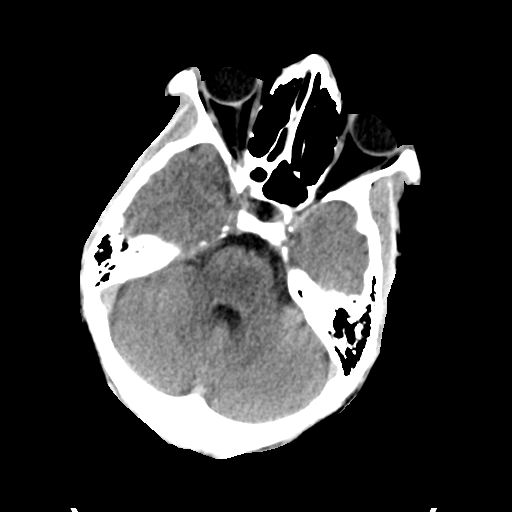
[im 11/35  brain]
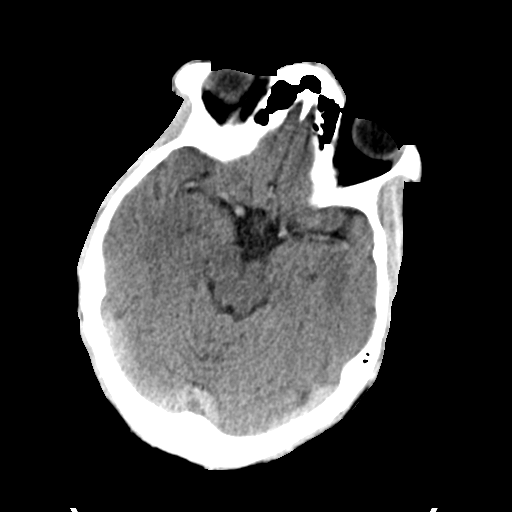
[im 15/35  brain]
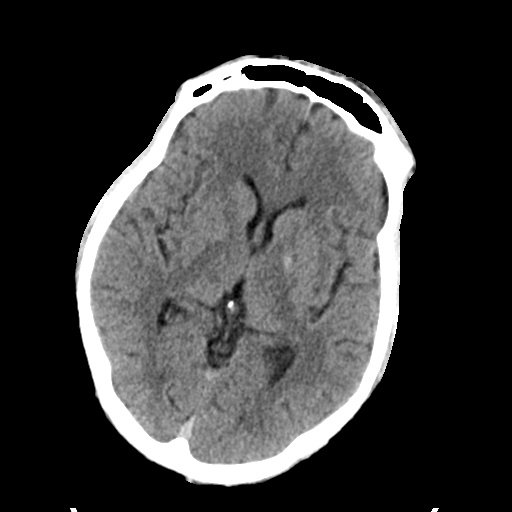
[im 15/35  bone]
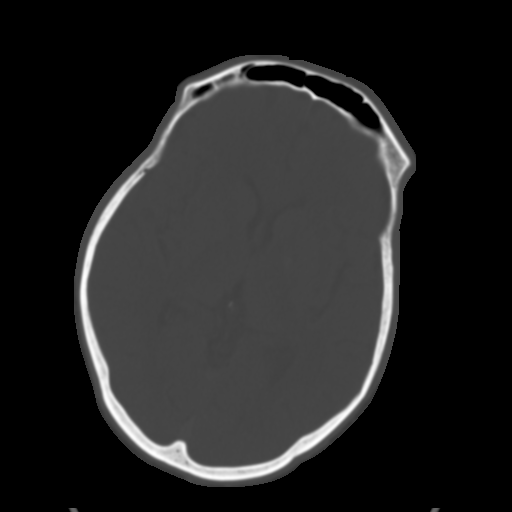
[im 18/35  brain]
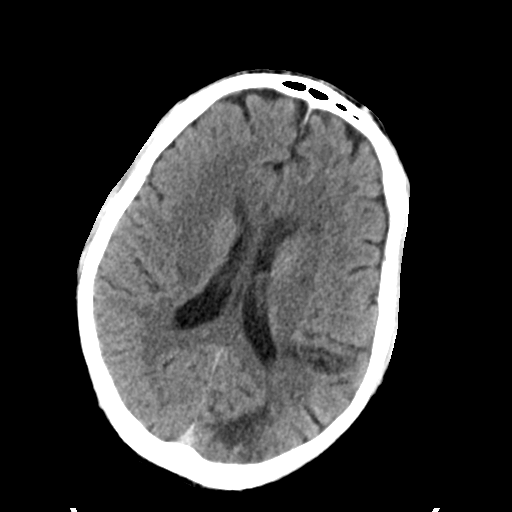
[im 20/35  brain]
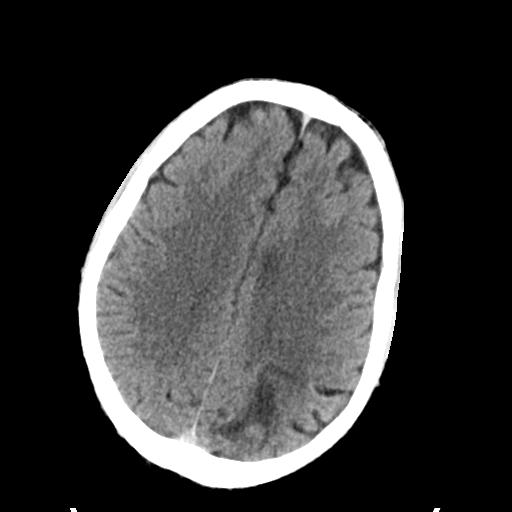
[im 24/35  brain]
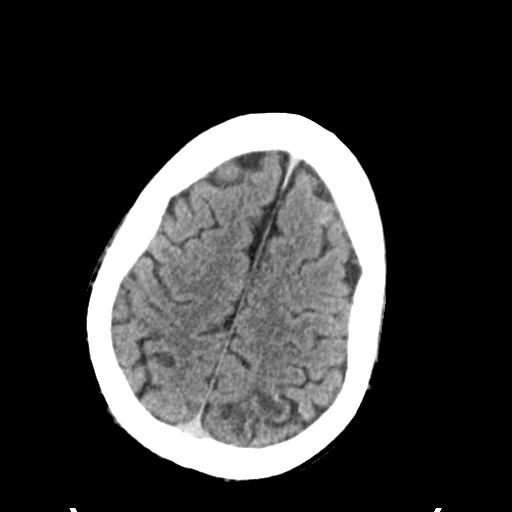
[im 26/35  brain]
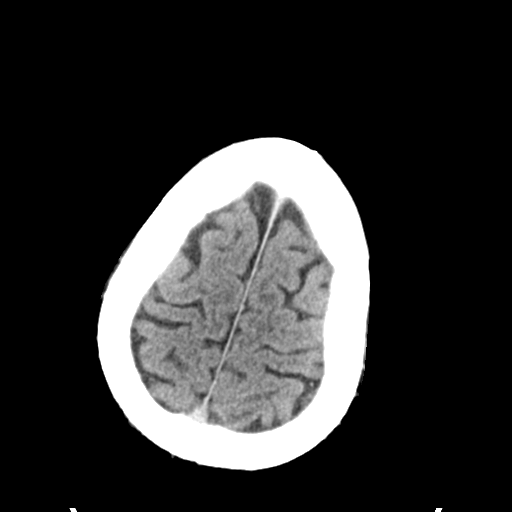
[im 26/35  bone]
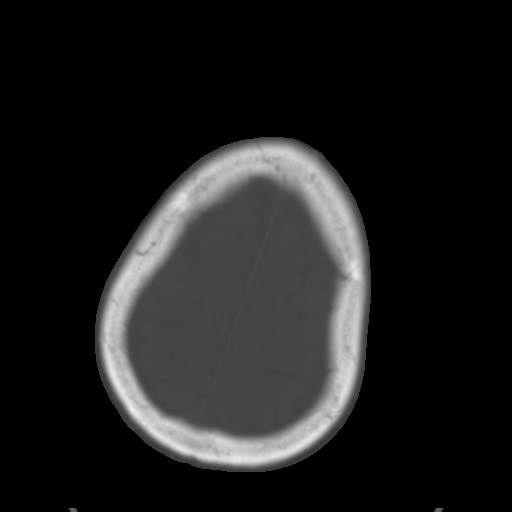
[im 30/35  brain]
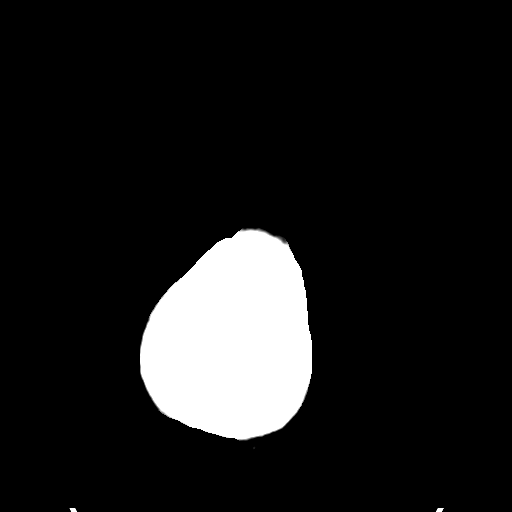
[im 32/35  brain]
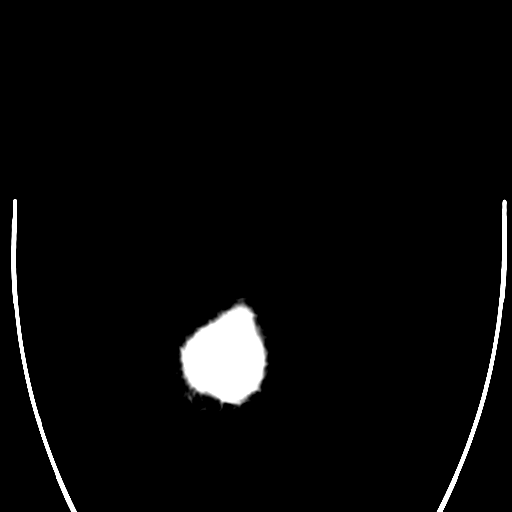

[Series 5: head 3.0 mpr cor · coronal · 0.34mm/px · 3 of 70 slices shown]
[im 24/70  brain]
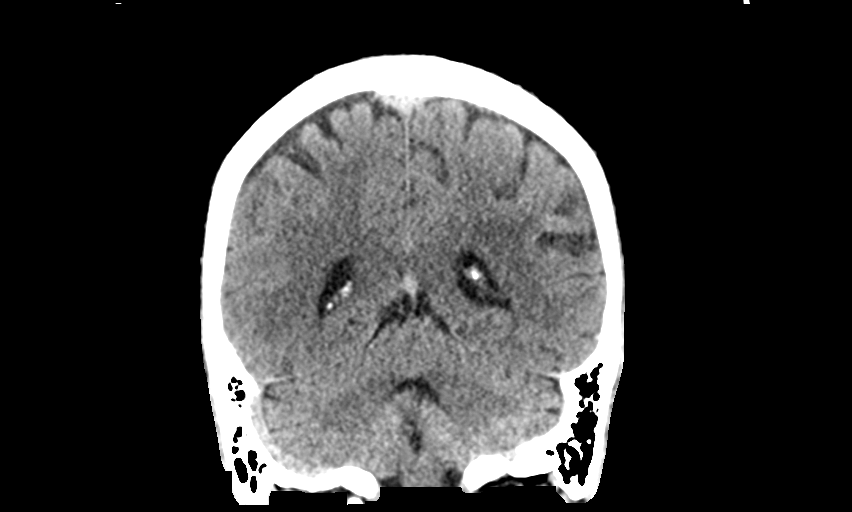
[im 31/70  brain]
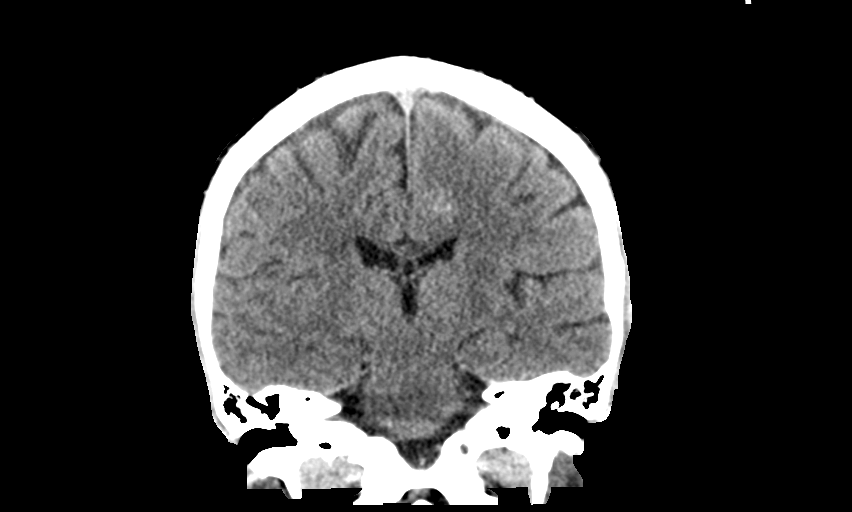
[im 39/70  brain]
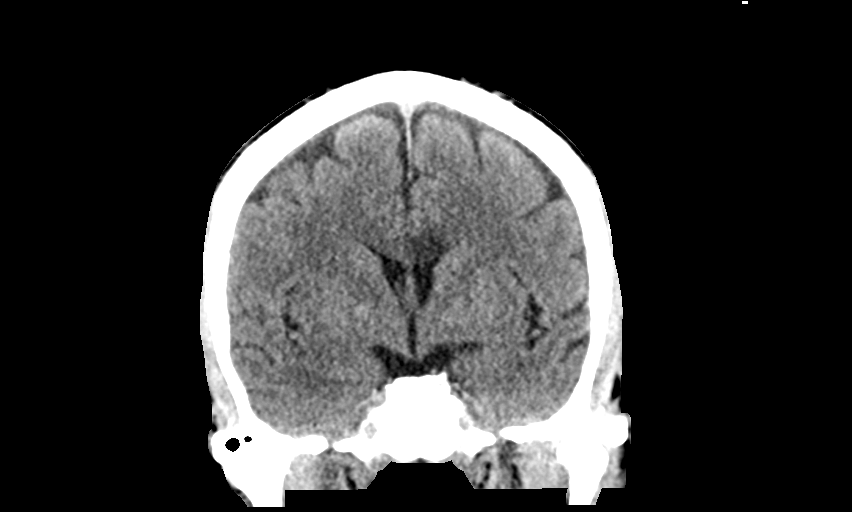

[14 of 47 positions shown; findings below may reference images not displayed]

FINDINGS: Brain: There is mild cerebral atrophy with widening of the
extra-axial spaces and ventricular dilatation.
There are areas of decreased attenuation within the white matter
tracts of the supratentorial brain, consistent with microvascular
disease changes.

A small amount of acute subarachnoid blood is seen within the left
frontal lobe. This represents a new finding when compared to the
prior study. There is no evidence of associated mass effect or
midline shift.

Areas of cortical and white matter low attenuation are again seen
involving the parietooccipital region on the left.

A small chronic right para thalamic lacunar infarct is seen.

A small area of low attenuation is seen along the ventral body of
the corpus callosum on the left. This is seen on the prior plain
brain CT and prior MR head and is unchanged in appearance.

Vascular: No hyperdense vessel or unexpected calcification.

Skull: Normal. Negative for fracture or focal lesion.

Sinuses/Orbits: No acute finding.

Other: None.
IMPRESSION: 1. Small acute left frontal lobe subarachnoid hemorrhage. This
represents a new finding when compared to the prior study.
2. Stable left parieto-occipital ischemic infarct.
3. Small, stable acute ischemic nonhemorrhagic left ACA territory
infarct.

## 2020-10-13 MED ORDER — TRAMADOL HCL 50 MG PO TABS
50.0000 mg | ORAL_TABLET | Freq: Three times a day (TID) | ORAL | Status: DC | PRN
  Administered 2020-10-13 (×2): 50 mg via ORAL
  Filled 2020-10-13 (×2): qty 1

## 2020-10-13 MED ORDER — ACETAMINOPHEN 500 MG PO TABS
1000.0000 mg | ORAL_TABLET | Freq: Three times a day (TID) | ORAL | Status: DC
  Administered 2020-10-13 – 2020-10-15 (×6): 1000 mg via ORAL
  Filled 2020-10-13 (×6): qty 2

## 2020-10-13 MED ORDER — CLOPIDOGREL BISULFATE 75 MG PO TABS: 75.0000 mg | ORAL_TABLET | Freq: Every day | ORAL | 1 refills | Status: AC

## 2020-10-13 MED ORDER — CARVEDILOL 6.25 MG PO TABS
3.1250 mg | ORAL_TABLET | Freq: Two times a day (BID) | ORAL | 1 refills | Status: DC
Start: 2020-10-13 — End: 2022-10-30

## 2020-10-13 MED ORDER — POTASSIUM CHLORIDE CRYS ER 20 MEQ PO TBCR
40.0000 meq | EXTENDED_RELEASE_TABLET | ORAL | Status: AC
  Administered 2020-10-13 (×2): 40 meq via ORAL
  Filled 2020-10-13 (×2): qty 2

## 2020-10-13 MED ORDER — CLONIDINE HCL 0.1 MG PO TABS
0.1000 mg | ORAL_TABLET | Freq: Two times a day (BID) | ORAL | Status: DC
  Administered 2020-10-13 – 2020-10-15 (×5): 0.1 mg via ORAL
  Filled 2020-10-13 (×5): qty 1

## 2020-10-13 MED ORDER — FUROSEMIDE 40 MG PO TABS
40.0000 mg | ORAL_TABLET | Freq: Every day | ORAL | 0 refills | Status: DC
Start: 2020-10-18 — End: 2022-10-30

## 2020-10-13 MED ORDER — HYDROMORPHONE HCL 1 MG/ML IJ SOLN
1.0000 mg | INTRAMUSCULAR | Status: AC | PRN
  Administered 2020-10-13 – 2020-10-14 (×4): 1 mg via INTRAVENOUS
  Filled 2020-10-13 (×4): qty 1

## 2020-10-13 MED ORDER — SUCRALFATE 1 G PO TABS: 1.0000 g | ORAL_TABLET | Freq: Three times a day (TID) | ORAL | 0 refills | Status: AC

## 2020-10-13 MED ORDER — ONDANSETRON HCL 4 MG/2ML IJ SOLN: 4.0000 mg | Freq: Three times a day (TID) | INTRAMUSCULAR | Status: DC | PRN

## 2020-10-13 MED ORDER — LORAZEPAM 2 MG/ML IJ SOLN
1.0000 mg | Freq: Once | INTRAMUSCULAR | Status: AC
  Administered 2020-10-13: 1 mg via INTRAVENOUS
  Filled 2020-10-13: qty 1

## 2020-10-13 MED ORDER — CARVEDILOL 6.25 MG PO TABS
6.2500 mg | ORAL_TABLET | Freq: Two times a day (BID) | ORAL | Status: DC
  Administered 2020-10-13 – 2020-10-15 (×5): 6.25 mg via ORAL
  Filled 2020-10-13 (×5): qty 1

## 2020-10-13 MED ORDER — ASPIRIN 81 MG PO TBEC: 81.0000 mg | DELAYED_RELEASE_TABLET | Freq: Every day | ORAL | 0 refills | Status: AC

## 2020-10-13 NOTE — Progress Notes (Signed)
PROGRESS NOTE  Alejandro Perez WPY:[id number] DOB: [birth year]   PCP: Pcp, No  Patient is from: Home  DOA: 10/10/2020 LOS: 2  Chief complaints: Chest pain  Brief Narrative / Interim history: 44 year old male with history of recent left MCA CVA, TTP s/p splenectomy, HTN, LVH and polysubstance use (cocaine, marijuana and tobacco) returning with chest pain.  Recently evaluated for this.  CTA chest was negative for dissection or other significant finding.  UDS was positive for cocaine.   He also had left-sided headache, blurry vision and expressive aphasia. CT head showed new hypoattenuation along the ventral body of the left corpus callosum eccentric to the left suspicious for acute subacute infarct.  Neurology consulted, and MRI ordered.  UDS positive for opiates and marijuana but negative for cocaine.  MRI revealed acute left ACA and patchy left MCA parieto-occipital infarct with late subacute left MCA infarct thought to be cardioembolic.  Neurology felt he is not a good long-term anticoagulation candidate given history of noncompliance and polysubstance abuse, and recommended Plavix and low-dose aspirin for 3 weeks followed by Plavix alone.   Cardiology was consulted about his chest pain, and felt the pain to be nonanginal and signed off.  His EKG was not diagnostic and was consistent with LVH with repolarization.  Therapy recommended CIR.  Subjective: Seen and examined earlier this morning.  No major events overnight of this morning.  He endorses left-sided headache, chest pain and epigastric pain.  He rates his pain 6/10.  Not able to describe the nature of his pain.  He denies shortness of breath, nausea, vomiting or UTI symptoms.  Objective: Vitals:   10/13/20 0008 10/13/20 0358 10/13/20 0818 10/13/20 1218  BP: (!) 146/79 103/90 (!) 179/92 (!) 170/97  Pulse: 76 67 66 63  Resp: 16 18 20 18   Temp: 98.1 F (36.7 C) 98.1 F (36.7 C) 97.7 F (36.5 C) 98.4 F (36.9 C)  TempSrc: Oral  Oral Oral   SpO2: 99% 100% 100% 99%  Weight:      Height:        Intake/Output Summary (Last 24 hours) at 10/13/2020 1428 Last data filed at 10/13/2020 0358 Gross per 24 hour  Intake 250 ml  Output 600 ml  Net -350 ml   Filed Weights   10/10/20 1317 10/11/20 1540  Weight: 73.5 kg 67 kg    Examination:  GENERAL: No apparent distress.  Nontoxic. HEENT: MMM.  Vision and hearing grossly intact.  NECK: Supple.  No apparent JVD.  RESP: On room air.  No IWOB.  Fair aeration bilaterally. CVS:  RRR. Heart sounds normal.  ABD/GI/GU: BS+. Abd soft.  Mild epigastric tenderness. MSK/EXT:  Moves extremities. No apparent deformity. No edema.  SKIN: no apparent skin lesion or wound NEURO: Awake, alert and oriented appropriately.  No focal neuro deficit other than expressive aphasia PSYCH: Calm. Normal affect.  Procedures:  None  Microbiology summarized: COVID-19 and influenza PCR nonreactive.  Assessment & Plan: Acute left ACA, left MCA infarct and subacute left MCA infarct: concern about cardioembolic etiology but not a good candidate for anticoagulation due to polysubstance use and fall risk -Neurology signed off and recommended Plavix and aspirin for 3 weeks followed by Plavix alone -BP goal normotensive -Continue statin. -Therapy recommended CIR.  Atypical chest pain with abnormal EKG: Troponin negative.  Abnormal EKG fails to be due to LVH with repolarization.  TTE reassuring.  Has history of cocaine but UDS negative for now.  He treated to leave AMA unless he gets  stronger pain medication but change his mind. -Cardiology signed off-no further cardiac work-up recommended. -Scheduled Tylenol with as needed tramadol  Headache/chest pain/abdominal pain-asking for IV Dilaudid. -Scheduled Tylenol and as needed tramadol as above -Continue Protonix and Carafate  Uncontrolled hypertension: BP elevated. -Continue home amlodipine -Home metoprolol changed to carvedilol due to history  of cocaine use -Resume home clonidine as low-dose -Continue home Lasix  Polysubstance use including marijuana, cocaine and tobacco -Counseled. -Offer nicotine patch  Debility/physical deconditioning -Therapy recommended CIR.  Body mass index is 18.96 kg/m.         DVT prophylaxis:  SCDs Start: 10/11/20 2159  Code Status: Full code Family Communication: Patient and/or RN. Available if any question.  Status is: Inpatient  Remains inpatient appropriate because:Unsafe d/c plan   Dispo: The patient is from: Home              Anticipated d/c is to: CIR              Anticipated d/c date is: 1 day              Patient currently is not medically stable to d/c.       Consultants:  Neurology Cardiology   Sch Meds:  Scheduled Meds: .  stroke: mapping our early stages of recovery book   Does not apply Once  . acetaminophen  1,000 mg Oral Q8H  . alum & mag hydroxide-simeth  30 mL Oral Once   And  . lidocaine  15 mL Oral Once  . amLODipine  10 mg Oral Daily  . aspirin EC  81 mg Oral Daily  . atorvastatin  40 mg Oral q1800  . carvedilol  6.25 mg Oral BID WC  . clopidogrel  75 mg Oral Daily  . folic acid  1 mg Oral Daily  . furosemide  40 mg Oral Daily  . LORazepam  1 mg Oral BID  . multivitamin with minerals  1 tablet Oral Daily  . pantoprazole  40 mg Oral Daily  . sucralfate  1 g Oral TID WC & HS  . cyanocobalamin  1,000 mcg Oral Daily   Continuous Infusions: PRN Meds:.hydrALAZINE, traMADol  Antimicrobials: Anti-infectives (From admission, onward)   None       I have personally reviewed the following labs and images: CBC: Recent Labs  Lab 10/10/20 1328 10/11/20 1639 10/12/20 0414  WBC 9.2 7.1 8.5  HGB 16.5 17.6* 17.6*  HCT 45.7 48.6 47.1  MCV 85.6 86.8 82.8  PLT 491* 358 501*   BMP &GFR Recent Labs  Lab 10/10/20 1328 10/11/20 1639 10/12/20 0414  NA 136 135 134*  K 3.3* 3.8 3.0*  CL 89* 96* 90*  CO2 32 24 29  GLUCOSE 90 92 98  BUN 13 14  15   CREATININE 1.87* 1.65* 1.67*  CALCIUM 10.1 9.9 10.0  MG  --  2.2 2.1  PHOS  --  2.8 3.3   Estimated Creatinine Clearance: 52.4 mL/min (A) (by C-G formula based on SCr of 1.67 mg/dL (H)). Liver & Pancreas: Recent Labs  Lab 10/10/20 1328 10/11/20 1639 10/12/20 0414  AST 36 73* 66*  ALT 65* 82* 93*  ALKPHOS 120 133* 162*  BILITOT 1.0 0.9 1.2  PROT 6.9 7.3 7.7  ALBUMIN 4.5 4.4 4.5   Recent Labs  Lab 10/10/20 1328  LIPASE 31   No results for input(s): AMMONIA in the last 168 hours. Diabetic: No results for input(s): HGBA1C in the last 72 hours. No results for input(s):  GLUCAP in the last 168 hours. Cardiac Enzymes: No results for input(s): CKTOTAL, CKMB, CKMBINDEX, TROPONINI in the last 168 hours. No results for input(s): PROBNP in the last 8760 hours. Coagulation Profile: No results for input(s): INR, PROTIME in the last 168 hours. Thyroid Function Tests: No results for input(s): TSH, T4TOTAL, FREET4, T3FREE, THYROIDAB in the last 72 hours. Lipid Profile: No results for input(s): CHOL, HDL, LDLCALC, TRIG, CHOLHDL, LDLDIRECT in the last 72 hours. Anemia Panel: No results for input(s): VITAMINB12, FOLATE, FERRITIN, TIBC, IRON, RETICCTPCT in the last 72 hours. Urine analysis:    Component Value Date/Time   COLORURINE YELLOW 10/08/2019 0456   APPEARANCEUR CLEAR 10/08/2019 0456   LABSPEC 1.013 10/08/2019 0456   PHURINE 6.0 10/08/2019 0456   GLUCOSEU NEGATIVE 10/08/2019 0456   HGBUR NEGATIVE 10/08/2019 0456   BILIRUBINUR NEGATIVE 10/08/2019 0456   KETONESUR NEGATIVE 10/08/2019 0456   PROTEINUR NEGATIVE 10/08/2019 0456   NITRITE NEGATIVE 10/08/2019 0456   LEUKOCYTESUR NEGATIVE 10/08/2019 0456   Sepsis Labs: Invalid input(s): PROCALCITONIN, LACTICIDVEN  Microbiology: Recent Results (from the past 240 hour(s))  Respiratory Panel by RT PCR (Flu A&B, Covid) - Nasopharyngeal Swab     Status: None   Collection Time: 10/10/20  5:13 PM   Specimen: Nasopharyngeal Swab;  Nasopharyngeal(NP) swabs in vial transport medium  Result Value Ref Range Status   SARS Coronavirus 2 by RT PCR NEGATIVE NEGATIVE Final    Comment: (NOTE) SARS-CoV-2 target nucleic acids are NOT DETECTED.  The SARS-CoV-2 RNA is generally detectable in upper respiratoy specimens during the acute phase of infection. The lowest concentration of SARS-CoV-2 viral copies this assay can detect is 131 copies/mL. A negative result does not preclude SARS-Cov-2 infection and should not be used as the sole basis for treatment or other patient management decisions. A negative result may occur with  improper specimen collection/handling, submission of specimen other than nasopharyngeal swab, presence of viral mutation(s) within the areas targeted by this assay, and inadequate number of viral copies (<131 copies/mL). A negative result must be combined with clinical observations, patient history, and epidemiological information. The expected result is Negative.  Fact Sheet for Patients:  https://www.moore.com/https://www.fda.gov/media/142436/download  Fact Sheet for Healthcare Providers:  https://www.young.biz/https://www.fda.gov/media/142435/download  This test is no t yet approved or cleared by the [place] FDA and  has been authorized for detection and/or diagnosis of SARS-CoV-2 by FDA under an Emergency Use Authorization (EUA). This EUA will remain  in effect (meaning this test can be used) for the duration of the COVID-19 declaration under Section 564(b)(1) of the Act, 21 U.S.C. section 360bbb-3(b)(1), unless the authorization is terminated or revoked sooner.     Influenza A by PCR NEGATIVE NEGATIVE Final   Influenza B by PCR NEGATIVE NEGATIVE Final    Comment: (NOTE) The Xpert Xpress SARS-CoV-2/FLU/RSV assay is intended as an aid in  the diagnosis of influenza from Nasopharyngeal swab specimens and  should not be used as a sole basis for treatment. Nasal washings and  aspirates are unacceptable for Xpert Xpress  SARS-CoV-2/FLU/RSV  testing.  Fact Sheet for Patients: https://www.moore.com/https://www.fda.gov/media/142436/download  Fact Sheet for Healthcare Providers: https://www.young.biz/https://www.fda.gov/media/142435/download  This test is not yet approved or cleared by the [place] FDA and  has been authorized for detection and/or diagnosis of SARS-CoV-2 by  FDA under an Emergency Use Authorization (EUA). This EUA will remain  in effect (meaning this test can be used) for the duration of the  Covid-19 declaration under Section 564(b)(1) of the Act, 21  U.S.C. section 360bbb-3(b)(1),  unless the authorization is  terminated or revoked. Performed at [facility], [address] [place], [place] [address]     Radiology Studies: No results found.    Rito Lecomte T. Gloyd Happ [facility]  If 7PM-7AM, please contact night-coverage [email or web address] 10/13/2020, 2:28 PM

## 2020-10-13 NOTE — Progress Notes (Signed)
Pt with increased agitation, cursing at staff, pulling off tele. Demanding that staff leave room. Able to place bed alarm on. Message sent to [facility] provider for possible d/c of tele due to cardiology signing off and pt with possible d/c

## 2020-10-13 NOTE — Progress Notes (Addendum)
Notified by RN that patient got up by himself and went to the bathroom.  Patient reports he fell and hit his head in the bathroom.  Patient is on fall precautions but he got up on his own without notifying staff that he needed assistance to go to the bathroom.  He is unsure if he had brief LOC.  He has been agitated and cussing at staff and pulled off his telemetry monitor.  Patient was seen by cardiology and cleared from cardiology standpoint for his chest pain earlier.  Telemetry is discontinued with cardiology clearing patient. Pain CT the head secondary to patient fall and hitting his head.  He is on Plavix and aspirin.  No acute neurological deficits at this time.  Addendum: Called by Radiologist. CT head shows a new small subarachnoid hemorrhage in left frontal lobe.  Will stop plavix and aspirin.  I personally went to bedside and notified Alejandro Perez of CT results Repeat CT head in 12 hours to document stability and ensure is not enlarging. If develops new neurologic symptoms will discuss with neurosurgery.

## 2020-10-13 NOTE — Progress Notes (Signed)
Inpatient Rehab Admissions Coordinator Note:   Per OT recommendations, pt was screened for CIR candidacy by Estill Dooms, PT, DPT.  Note PT recommending outpatient PT and pt ambulating 250'. SLP recommending CIR versus outpatient versus HHSLP.  Note pt with [facility] Medicaid which does not contract with [facility].  Would need to seek f/u in [facility] facility viaTOC team.  Will not request a CIR consult at this time.  Pease contact me with questions.   Estill Dooms, PT, DPT [phone] 10/13/20 12:38 PM

## 2020-10-13 NOTE — Progress Notes (Signed)
Alerted by staff pt up and in bathroom, bed alarm sounding. Entered room, stood outside bathroom door, pt cursing at staff from inside bathroom, stating staff better not be there when patient gets out. Waited outside of bathroom until pt done, attempted to provide stand by assistance to pt, noted pt with unsteady gait. Pt continued to curse at staff. Will attempt to place tele on pt at a later time providing pt allows

## 2020-10-13 NOTE — Progress Notes (Signed)
Notified by NA that pt's room smelled like cigarette smoke. Not sure if someone smoked in the room or if visitor stepped outside to smoke and later returned. This nurse educated pt and significant other that its is not allowed to smoke in the building and how dangerous it could be. Significant other states that she stepped outside to smoke then returned to the room. Pt and significant other verbalized understanding.

## 2020-10-13 NOTE — Evaluation (Signed)
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 Current MRI also shows L ACA. PMH: anemia, HTN, polysubstance abuse   Clinical Impression   PT admitted with L ACA. Pt currently with functional limitiations due to the deficits listed below (see OT problem list). Pt currrently with light sensitivity and R visual field deficits noted. Pt requires Min (A) for all balance with adls.   Pt will benefit from skilled OT to increase their independence and safety with adls and balance to allow discharge CIR.     Follow Up Recommendations  CIR    Equipment Recommendations  None recommended by OT    Recommendations for Other Services Rehab consult;Other (comment) (services for the blind)     Precautions / Restrictions Precautions Precautions: Fall Restrictions Weight Bearing Restrictions: No      Mobility Bed Mobility Overal bed mobility: Modified Independent                  Transfers Overall transfer level: Needs assistance Equipment used: 1 person hand held assist Transfers: Sit to/from Stand Sit to Stand: Min assist              Balance   Sitting-balance support: Bilateral upper extremity supported;Feet supported Sitting balance-Leahy Scale: Fair     Standing balance support: Bilateral upper extremity supported;During functional activity Standing balance-Leahy Scale: Poor Standing balance comment: reliant on external support                 Standardized Balance Assessment Standardized Balance Assessment : Berg Balance Test Berg Balance Test Standing Unsupported: Needs several tries to stand 30 seconds unsupported Stand to Sit: Sits independently, has uncontrolled descent Transfers:  Needs one person to assist Standing Unsupported with Eyes Closed: Needs help to keep from falling Standing Ubsupported with Feet Together: Needs help to attain position and unable to hold for 15 seconds From Standing, Reach Forward with Outstretched Arm: Loses balance while trying/requires external support Standing Unsupported, One Foot in Front: Loses balance while stepping or standing       ADL either performed or assessed with clinical judgement   ADL Overall ADL's : Needs assistance/impaired     Grooming: Oral care;Minimal assistance;Standing Grooming Details (indicate cue type and reason): pt with unsteady static standing and requires R UE support on sink with OT holding patient to complete task. pt unable to find objects in R visual field. pt unable to recognize undershooting tooth brush using half a tube of paste             Lower Body Dressing: Modified independent Lower Body Dressing Details (indicate cue type and reason): long sitting in the bed Toilet Transfer: Minimal assistance           Functional mobility during ADLs: Minimal assistance General ADL Comments: pt demonstrates balance deficits and high fall risk . pt with visual deficits that pt reports are new     Vision Baseline Vision/History: Glaucoma Patient Visual Report: Other (comment);Diplopia Vision Assessment?: Yes Eye Alignment: Impaired (comment) Ocular Range of Motion: Restricted on the right Alignment/Gaze Preference: Gaze left Tracking/Visual Pursuits: Impaired - to be further tested in functional context Visual Fields: Right visual field deficit Additional Comments: pt reports pain with ocular movement to the RIght. pt  reports diplopia x2 during session but then reprots resolved in session. pt with visual deficits of R superior and inferior quadrants noted     Perception Perception Perception Tested?: Yes Perception Deficits: Inattention/neglect Inattention/Neglect: Does not attend to right  visual field   Praxis      Pertinent Vitals/Pain Pain Assessment: Faces Faces Pain Scale: Hurts even more Pain Location: head Pain Descriptors / Indicators: Discomfort Pain Intervention(s): Monitored during session;Repositioned     Hand Dominance Right   Extremity/Trunk Assessment Upper Extremity Assessment Upper Extremity Assessment: Overall WFL for tasks assessed   Lower Extremity Assessment Lower Extremity Assessment: Defer to PT evaluation;RLE deficits/detail RLE Deficits / Details: reports right thigh pain    Cervical / Trunk Assessment Cervical / Trunk Assessment: Normal   Communication Communication Communication: Expressive difficulties   Cognition Arousal/Alertness: Awake/alert Behavior During Therapy: WFL for tasks assessed/performed Overall Cognitive Status: Difficult to assess                           Safety/Judgement: Decreased awareness of deficits   Problem Solving: Slow processing General Comments: pt able to sequence oral care but demonstrates R inattention to task   General Comments       Exercises     Shoulder Instructions      Home Living Family/patient expects to be discharged to:: Private residence Living Arrangements: Spouse/significant other Available Help at Discharge: Family Type of Home: Mobile home Home Access: Stairs to enter Secretary/administrator of Steps: 5   Home Layout: One level     Bathroom Shower/Tub: Chief Strategy Officer: Standard     Home Equipment: None   Additional Comments: spouse, sister when spouse at work   Lives With: Spouse    Prior Functioning/Environment Level of Independence: Needs assistance  Gait / Transfers Assistance Needed: supervision with ambulation since last admission and wife reports he wasn't amulating much ADL's / Homemaking Assistance Needed: wife assisting with bathing and dressing            OT Problem List: Decreased strength;Decreased activity  tolerance;Impaired balance (sitting and/or standing);Decreased cognition;Decreased coordination;Impaired vision/perception;Decreased safety awareness;Decreased knowledge of use of DME or AE;Decreased knowledge of precautions;Impaired sensation;Pain;Impaired UE functional use      OT Treatment/Interventions: Self-care/ADL training;Therapeutic exercise;Neuromuscular education;Energy conservation;DME and/or AE instruction;Manual therapy;Modalities;Therapeutic activities;Cognitive remediation/compensation;Visual/perceptual remediation/compensation;Patient/family education;Balance training    OT Goals(Current goals can be found in the care plan section) Acute Rehab OT Goals Patient Stated Goal: none stated OT Goal Formulation: With patient Time For Goal Achievement: 10/27/20 Potential to Achieve Goals: Good  OT Frequency: Min 2X/week   Barriers to D/C:            Co-evaluation              AM-PAC OT "6 Clicks" Daily Activity     Outcome Measure Help from another person eating meals?: A Lot Help from another person taking care of personal grooming?: A Lot Help from another person toileting, which includes using toliet, bedpan, or urinal?: A Lot Help from another person bathing (including washing, rinsing, drying)?: A Lot Help from another person to put on and taking off regular upper body clothing?: A Lot Help from another person to put on and taking off regular lower body clothing?: A Lot 6 Click Score: 12   End of Session Nurse Communication: Mobility status;Precautions  Activity Tolerance: Patient tolerated treatment well Patient left: in bed;with call bell/phone within reach;with bed alarm set  OT  Visit Diagnosis: Other abnormalities of gait and mobility (R26.89);Muscle weakness (generalized) (M62.81);History of falling (Z91.81);Pain;Dizziness and giddiness (R42);Other symptoms and signs involving the nervous system (R29.898)                Time: 1035-1050 OT Time Calculation  (min): 15 min Charges:  OT General Charges $OT Visit: 1 Visit OT Evaluation $OT Eval Moderate Complexity: 1 Mod   Brynn, OTR/L  Acute Rehabilitation Services Pager: [phone] Office: [phone] .   Mateo Flow 10/13/2020, 11:18 AM

## 2020-10-13 NOTE — Progress Notes (Signed)
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 Current MRI also shows L ACA. PMH: anemia, HTN, polysubstance abuse    PT Comments    Pt demonstrates significant R visual field deficits that impact his safety and independence with all mobility. He required extensive cues to turn his head to find objects or numbers directed by therapist to find. However, even when an object was medial in his R visual field he was unable to find it. He had to turn his head completely and even his body to get the object in his L visual field to find it. This limited his ability to find his room also, impacting his safety at home. He passed his room significantly and required extensive cues on how to problem solve how to find his room. He required minA for transfers and gait without an AD due to bouts of staggering and LOB to the R. Will continue to follow acutely. Due to his high risk for falls and injury with his current deficits CIR is being recommended to maximize his independence and safety with functional mobility.    Follow Up Recommendations  Supervision for mobility/OOB;CIR     Equipment Recommendations  None recommended by PT    Recommendations for Other Services       Precautions / Restrictions Precautions Precautions: Fall Precaution Comments: legally blind  Restrictions Weight Bearing Restrictions: No    Mobility  Bed Mobility Overal bed mobility: Modified Independent Bed Mobility: Supine to Sit     Supine to sit: Modified independent (Device/Increase time)     General bed mobility comments: Mod I with use of bed rails to come to sit safely.   Transfers Overall transfer level: Needs assistance Equipment used:  None Transfers: Sit to/from Stand Sit to Stand: Min assist         General transfer comment: MinA for steadying. Pt able to come to stand in appropriate amount of time. Cues provided to widen BOS prior to transfer as feet were touching.  Ambulation/Gait Ambulation/Gait assistance: Min assist Gait Distance (Feet): 300 Feet Assistive device: None Gait Pattern/deviations: Narrow base of support;Drifts right/left Gait velocity: decreased Gait velocity interpretation: 1.31 - 2.62 ft/sec, indicative of limited community ambulator General Gait Details: pt with narrow base and occasional dyscoordination of RLE leading to drifting but no LOB on even, level surface. With head turns, pt had mild LOB with self correction. Min A given for safety. Pt cued to turn head to scan surroundings for objects or to identify number of fingers held up by therapist or room numbers. Pt would turn his head but required extra time to process and identify numbers to find his room, with him passing his room significantly 1x when room numbers were on his R side.    Stairs Stairs: Yes Stairs assistance: Min guard Stair Management: Two rails;Alternating pattern;Forwards Number of Stairs: 6 General stair comments: No LOB noted, min guard assist and verbal cues provided to turn around at top of steps to begin descent.   Wheelchair Mobility    Modified Rankin (Stroke Patients Only) Modified Rankin (Stroke Patients Only) Pre-Morbid Rankin Score: Moderately severe disability Modified Rankin: Moderately severe disability     Balance Overall balance assessment: Needs assistance Sitting-balance support: No upper extremity  supported;Feet supported Sitting balance-Leahy Scale: Fair Sitting balance - Comments: Static sitting no LOB, supervision for safety.   Standing balance support: No upper extremity supported Standing balance-Leahy Scale: Fair Standing balance comment: Able to ambulate without UE support, but  intermittent min LOB to R with minA for safety.                 Standardized Balance Assessment Standardized Balance Assessment : Berg Balance Test Berg Balance Test Standing Unsupported: Needs several tries to stand 30 seconds unsupported Stand to Sit: Sits independently, has uncontrolled descent Transfers: Needs one person to assist Standing Unsupported with Eyes Closed: Needs help to keep from falling Standing Ubsupported with Feet Together: Needs help to attain position and unable to hold for 15 seconds From Standing, Reach Forward with Outstretched Arm: Loses balance while trying/requires external support Standing Unsupported, One Foot in Front: Loses balance while stepping or standing        Cognition Arousal/Alertness: Awake/alert Behavior During Therapy: WFL for tasks assessed/performed Overall Cognitive Status: Impaired/Different from baseline Area of Impairment: Safety/judgement;Awareness;Problem solving                         Safety/Judgement: Decreased awareness of safety;Decreased awareness of deficits Awareness: Emergent Problem Solving: Slow processing;Decreased initiation;Requires verbal cues General Comments: Required repeated cues to turn head to scan surroundings to the R due to visual deficits to maintain safety. Pt unable to find room due to decreased R visual field and required extensive cues to problem solve where to look for room numbers along with how to identify his own room number with his room number provided to pt as he did not recall.      Exercises      General Comments        Pertinent Vitals/Pain Pain Assessment: Faces Faces Pain Scale: Hurts even more Pain Location: head Pain Descriptors / Indicators: Discomfort Pain Intervention(s): Limited activity within patient's tolerance;Monitored during session;Repositioned;Patient requesting pain meds-RN notified    Home Living Family/patient expects to be discharged to:: Private  residence Living Arrangements: Spouse/significant other Available Help at Discharge: Family Type of Home: Mobile home Home Access: Stairs to enter   Home Layout: One level Home Equipment: None Additional Comments: spouse, sister when spouse at work     Prior Function Level of Independence: Needs assistance  Gait / Transfers Assistance Needed: supervision with ambulation since last admission and wife reports he wasn't amulating much ADL's / Homemaking Assistance Needed: wife assisting with bathing and dressing     PT Goals (current goals can now be found in the care plan section) Acute Rehab PT Goals Patient Stated Goal: To improve PT Goal Formulation: With patient/family Time For Goal Achievement: 10/26/20 Potential to Achieve Goals: Good Progress towards PT goals: Progressing toward goals    Frequency    Min 4X/week      PT Plan Discharge plan needs to be updated    Co-evaluation              AM-PAC PT "6 Clicks" Mobility   Outcome Measure  Help needed turning from your back to your side while in a flat bed without using bedrails?: None Help needed moving from lying on your back to sitting on the side of a flat bed without using bedrails?: None Help needed moving to and from a bed to a chair (including a wheelchair)?: A Little Help needed standing up from a chair using your arms (e.g., wheelchair or bedside  chair)?: A Little Help needed to walk in hospital room?: A Little Help needed climbing 3-5 steps with a railing? : A Little 6 Click Score: 20    End of Session Equipment Utilized During Treatment: Gait belt Activity Tolerance: Patient tolerated treatment well Patient left: in bed;with call bell/phone within reach;with bed alarm set;with family/visitor present (wife present)   PT Visit Diagnosis: Other abnormalities of gait and mobility (R26.89);Muscle weakness (generalized) (M62.81);Unsteadiness on feet (R26.81);Other symptoms and signs involving the  nervous system (R29.898);Pain Pain - Right/Left:  (head) Pain - part of body:  (headache)     Time: 8250-5397 PT Time Calculation (min) (ACUTE ONLY): 16 min  Charges:  $Gait Training: 8-22 mins                     Raymond Gurney, PT, DPT Acute Rehabilitation Services  Pager: [phone] Office: [phone]    Jewel Baize 10/13/2020, 12:47 PM

## 2020-10-13 NOTE — Progress Notes (Signed)
Progress Note  Patient Name: Alejandro Perez Date of Encounter: 10/13/2020  Primary Cardiologist:   [name], MD   Subjective   He still complains of chest pain and headache.   Ambulated with PT.   Inpatient Medications    Scheduled Meds: .  stroke: mapping our early stages of recovery book   Does not apply Once  . alum & mag hydroxide-simeth  30 mL Oral Once   And  . lidocaine  15 mL Oral Once  . amLODipine  10 mg Oral Daily  . aspirin EC  81 mg Oral Daily  . atorvastatin  40 mg Oral q1800  . carvedilol  3.125 mg Oral BID WC  . clopidogrel  75 mg Oral Daily  . folic acid  1 mg Oral Daily  . furosemide  40 mg Oral Daily  . LORazepam  1 mg Oral BID  . multivitamin with minerals  1 tablet Oral Daily  . pantoprazole  40 mg Oral Daily  . potassium chloride  40 mEq Oral Q3H  . sucralfate  1 g Oral TID WC & HS  . cyanocobalamin  1,000 mcg Oral Daily   Continuous Infusions:  PRN Meds: acetaminophen, hydrALAZINE, HYDROmorphone (DILAUDID) injection   Vital Signs    Vitals:   10/12/20 1549 10/12/20 2025 10/13/20 0008 10/13/20 0358  BP: (!) 161/90 (!) 150/81 (!) 146/79 103/90  Pulse: 64 62 76 67  Resp: 18 18 16 18   Temp: 98 F (36.7 C) 98.2 F (36.8 C) 98.1 F (36.7 C) 98.1 F (36.7 C)  TempSrc: Oral Oral Oral Oral  SpO2: 100% 100% 99% 100%  Weight:      Height:        Intake/Output Summary (Last 24 hours) at 10/13/2020 0814 Last data filed at 10/13/2020 0358 Gross per 24 hour  Intake 600 ml  Output 600 ml  Net 0 ml   Filed Weights   10/10/20 1317 10/11/20 1540  Weight: 73.5 kg 67 kg    Telemetry    NSR, artifact - Personally Reviewed  ECG    NA - Personally Reviewed  Physical Exam   GEN: No acute distress.   Neck: No  JVD Cardiac: RRR, no murmurs, rubs, or gallops.  Respiratory: Clear  to auscultation bilaterally. GI: Soft, nontender, non-distended  MS: No  edema; No deformity. Neuro:  Nonfocal  Psych: Normal affect   Labs      Chemistry Recent Labs  Lab 10/10/20 1328 10/11/20 1639 10/12/20 0414  NA 136 135 134*  K 3.3* 3.8 3.0*  CL 89* 96* 90*  CO2 32 24 29  GLUCOSE 90 92 98  BUN 13 14 15   CREATININE 1.87* 1.65* 1.67*  CALCIUM 10.1 9.9 10.0  PROT 6.9 7.3 7.7  ALBUMIN 4.5 4.4 4.5  AST 36 73* 66*  ALT 65* 82* 93*  ALKPHOS 120 133* 162*  BILITOT 1.0 0.9 1.2  GFRNONAA 44* 52* 51*  ANIONGAP 15 15 15      Hematology Recent Labs  Lab 10/10/20 1328 10/11/20 1639 10/12/20 0414  WBC 9.2 7.1 8.5  RBC 5.34 5.60 5.69  HGB 16.5 17.6* 17.6*  HCT 45.7 48.6 47.1  MCV 85.6 86.8 82.8  MCH 30.9 31.4 30.9  MCHC 36.1* 36.2* 37.4*  RDW 12.7 12.7 12.5  PLT 491* 358 501*    Cardiac EnzymesNo results for input(s): TROPONINI in the last 168 hours. No results for input(s): TROPIPOC in the last 168 hours.   BNPNo results for input(s): BNP, PROBNP in  the last 168 hours.   DDimer No results for input(s): DDIMER in the last 168 hours.   Radiology    No results found.  Cardiac Studies   TEE 09/28/20  1. Left ventricular ejection fraction, by estimation, is 55 to 60%. The  left ventricle has normal function. The left ventricle has no regional  wall motion abnormalities. Left ventricular diastolic function could not  be evaluated.  2. Right ventricular systolic function is normal. The right ventricular  size is normal.  3. Left atrial size was mild to moderately dilated. No left atrial/left  atrial appendage thrombus was detected.  4. The mitral valve is normal in structure. Mild mitral valve  regurgitation. No evidence of mitral stenosis.  5. By PHT there is mild aortic insufficiency, but visually there appears  to be moderate AI eccentrically directed in the LVOT towards the anterior  MV leaflet. The aortic valve is tricuspid. Mild aortic valve sclerosis is  present, with no evidence of  aortic valve stenosis.  6. The inferior vena cava is normal in size with greater than 50%  respiratory  variability, suggesting right atrial pressure of 3 mmHg.  7. Agitated saline contrast bubble study was negative, with no evidence  of any interatrial shunt.   Patient Profile     44 y.o. male with a hx of CVA admission 11/04-11/07/2020 (had some CP during that admit), LVH, HTN, HLD, thrombocytosis 09/2019, enlarged spleen, CKD III, polysubstance abuse, who is being seen today for the evaluation of abnormal ECG at the request of Dr [name].   Assessment & Plan    CHEST PAIN:  Likely non anginal.  No further in patient work up.    [facility] will sign off.   Medication Recommendations:  Continue MAR meds Other recommendations (labs, testing, etc):  No inpatient testing Follow up as an outpatient:  We will arrange two week hospital follow up.     For questions or updates, please contact [facility] Please consult [email or web address] for contact info under Cardiology/STEMI.   Signed, Rollene Rotunda, MD  10/13/2020, 8:14 AM

## 2020-10-14 ENCOUNTER — Inpatient Hospital Stay (HOSPITAL_COMMUNITY): Payer: Medicaid - Out of State

## 2020-10-14 DIAGNOSIS — R519 Headache, unspecified: Secondary | ICD-10-CM

## 2020-10-14 DIAGNOSIS — W19XXXA Unspecified fall, initial encounter: Secondary | ICD-10-CM

## 2020-10-14 DIAGNOSIS — R079 Chest pain, unspecified: Secondary | ICD-10-CM | POA: Diagnosis not present

## 2020-10-14 DIAGNOSIS — N1831 Chronic kidney disease, stage 3a: Secondary | ICD-10-CM

## 2020-10-14 DIAGNOSIS — I609 Nontraumatic subarachnoid hemorrhage, unspecified: Secondary | ICD-10-CM | POA: Diagnosis not present

## 2020-10-14 DIAGNOSIS — I10 Essential (primary) hypertension: Secondary | ICD-10-CM | POA: Diagnosis not present

## 2020-10-14 DIAGNOSIS — F1120 Opioid dependence, uncomplicated: Secondary | ICD-10-CM | POA: Diagnosis not present

## 2020-10-14 DIAGNOSIS — R636 Underweight: Secondary | ICD-10-CM

## 2020-10-14 LAB — RENAL FUNCTION PANEL
Albumin: 4 g/dL (ref 3.5–5.0)
Anion gap: 12 (ref 5–15)
BUN: 16 mg/dL (ref 6–20)
CO2: 25 mmol/L (ref 22–32)
Calcium: 9.8 mg/dL (ref 8.9–10.3)
Chloride: 99 mmol/L (ref 98–111)
Creatinine, Ser: 1.73 mg/dL — ABNORMAL HIGH (ref 0.61–1.24)
GFR, Estimated: 49 mL/min — ABNORMAL LOW (ref >60)
Glucose, Bld: 101 mg/dL — ABNORMAL HIGH (ref 70–99)
Phosphorus: 2.9 mg/dL (ref 2.5–4.6)
Potassium: 4 mmol/L (ref 3.5–5.1)
Sodium: 136 mmol/L (ref 135–145)

## 2020-10-14 LAB — MAGNESIUM: Magnesium: 2.1 mg/dL (ref 1.7–2.4)

## 2020-10-14 IMAGING — CT CT HEAD W/O CM
4 series · 16 of 47 positions shown, 18 images · non-contrast
Comparison: [DATE], [DATE]

CLINICAL DATA: Follow-up subarachnoid hemorrhage

EXAM:
CT HEAD WITHOUT CONTRAST
TECHNIQUE: Contiguous axial images were obtained from the base of the skull
through the vertex without intravenous contrast.

[Series 3: head wo · axial · 0.43mm/px · z∈[-109,+11]mm · 7 of 33 slices shown, 9 images]
[im 5/33  brain]
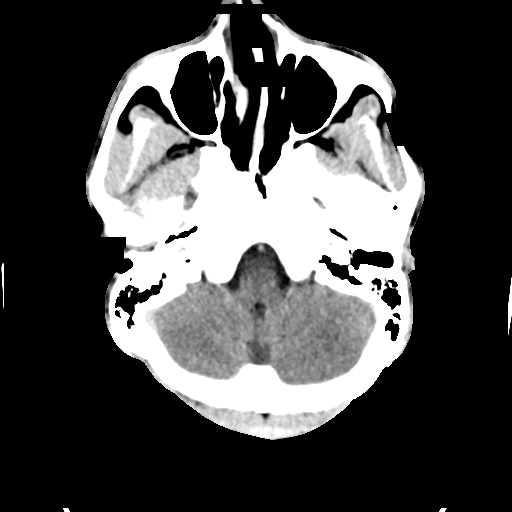
[im 5/33  bone]
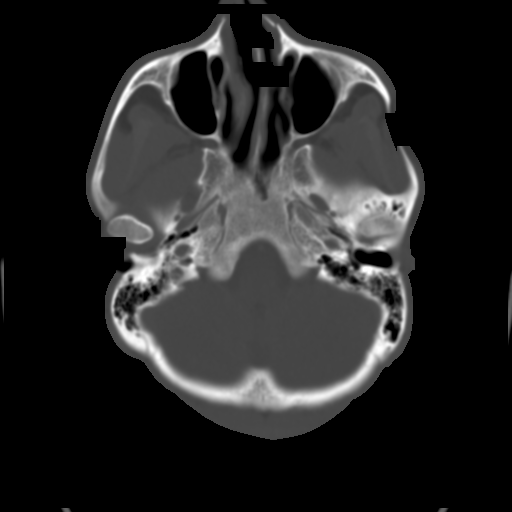
[im 9/33  brain]
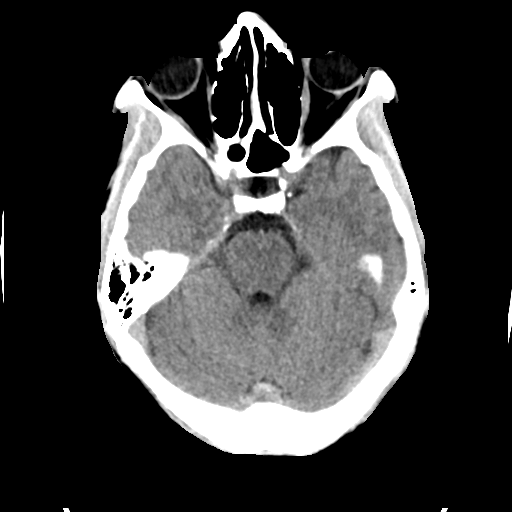
[im 13/33  brain]
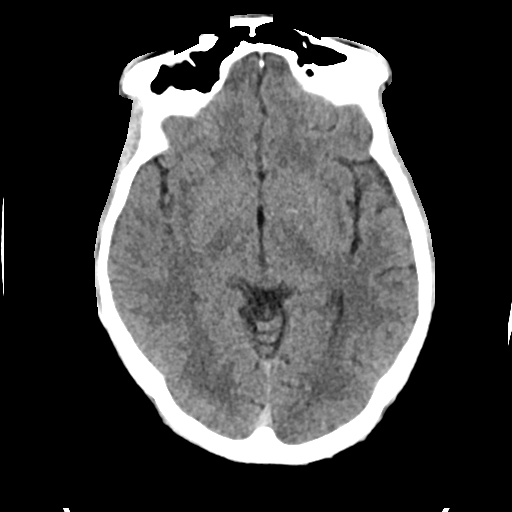
[im 17/33  brain]
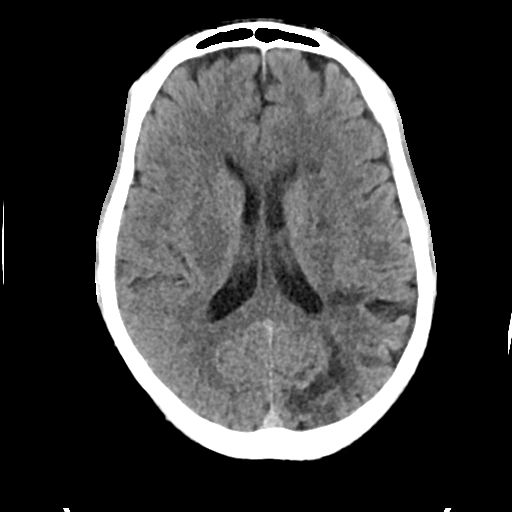
[im 21/33  brain]
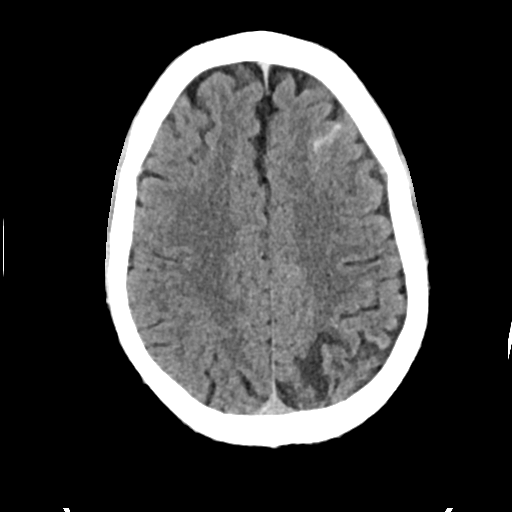
[im 21/33  bone]
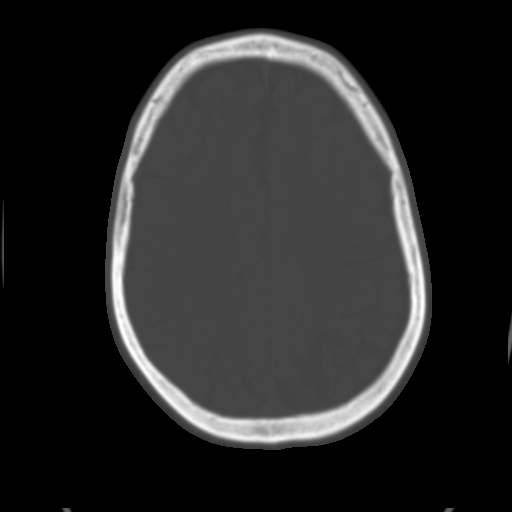
[im 25/33  brain]
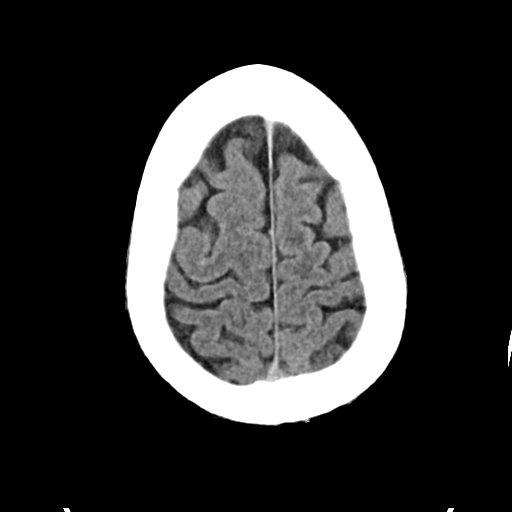
[im 29/33  brain]
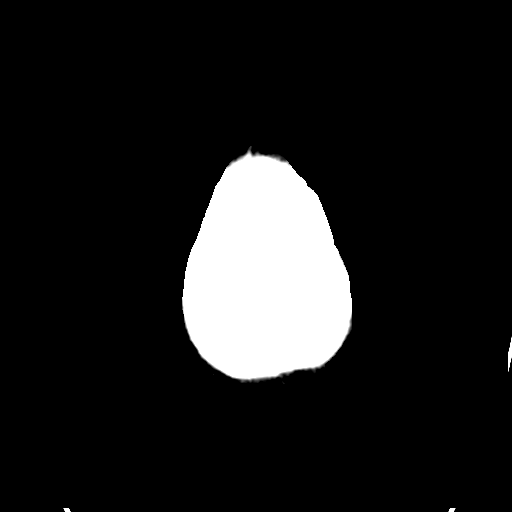

[Series 4: head bone · axial · 0.43mm/px · z∈[-113,-81]mm · 3 of 82 slices shown]
[im 9/82  bone]
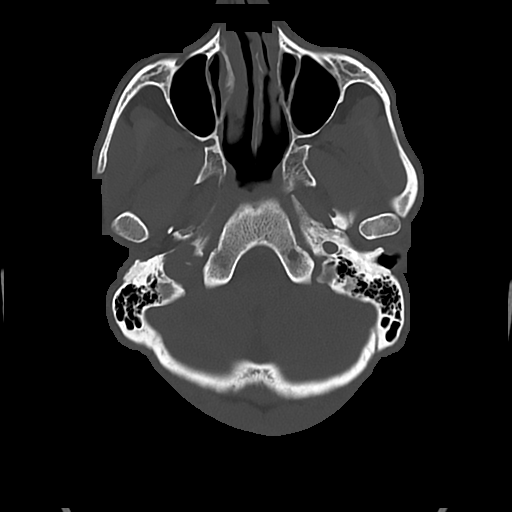
[im 17/82  bone]
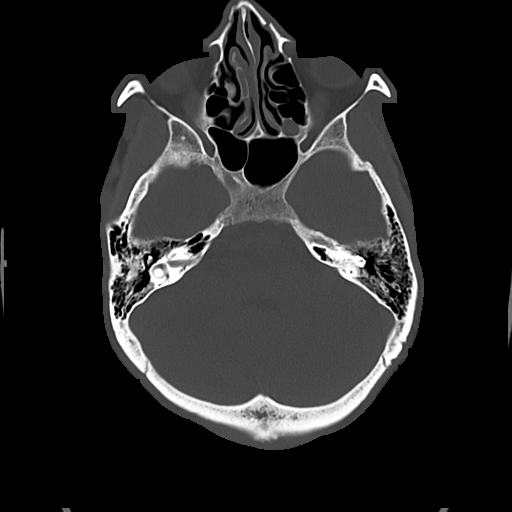
[im 25/82  bone]
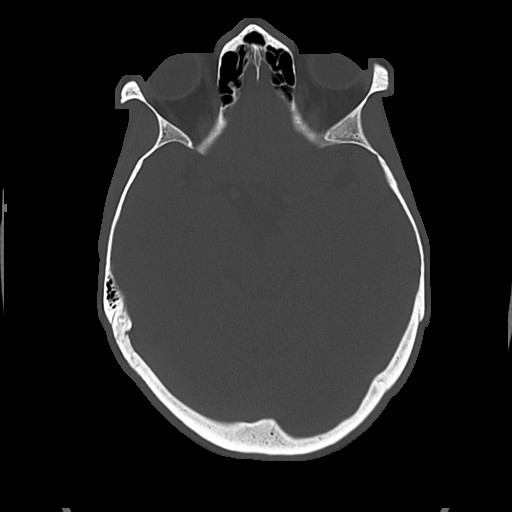

[Series 5: cor soft · coronal · 0.31mm/px · 3 of 72 slices shown]
[im 24/72  brain]
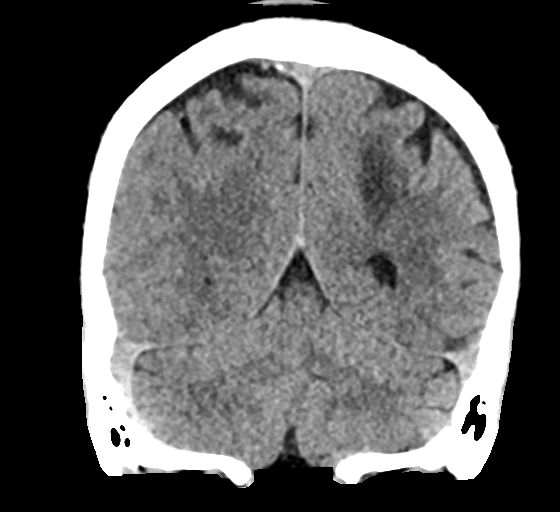
[im 32/72  brain]
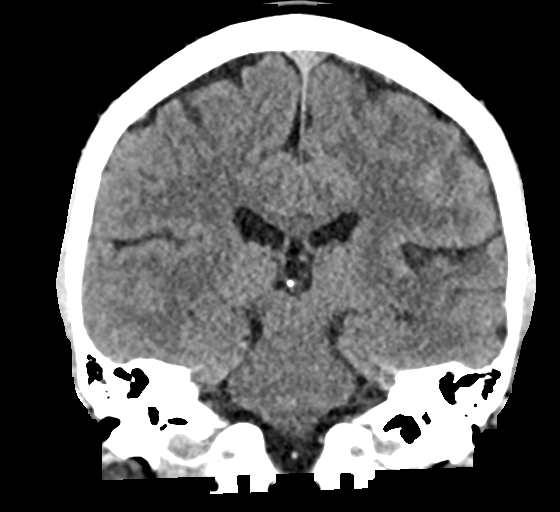
[im 40/72  brain]
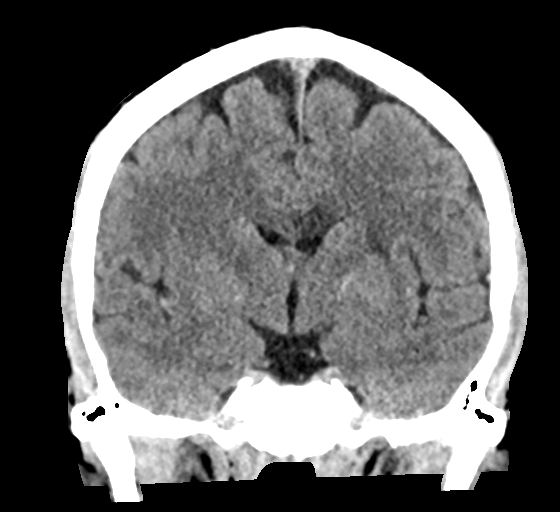

[Series 6: sag soft · sagittal · 0.31mm/px · 3 of 59 slices shown]
[im 20/59  brain]
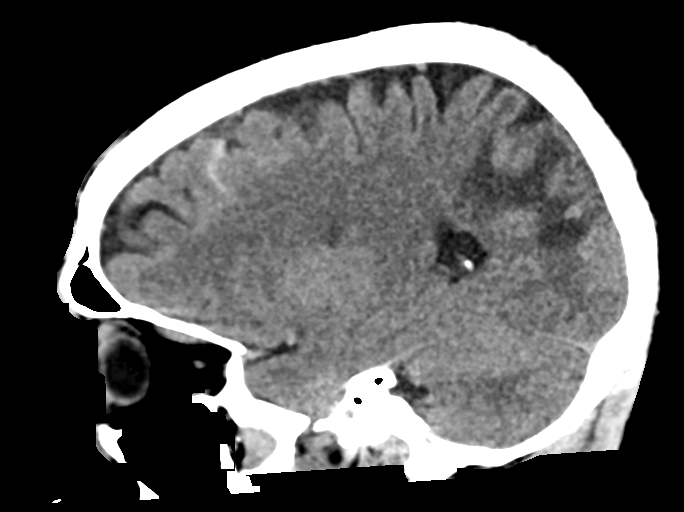
[im 30/59  brain]
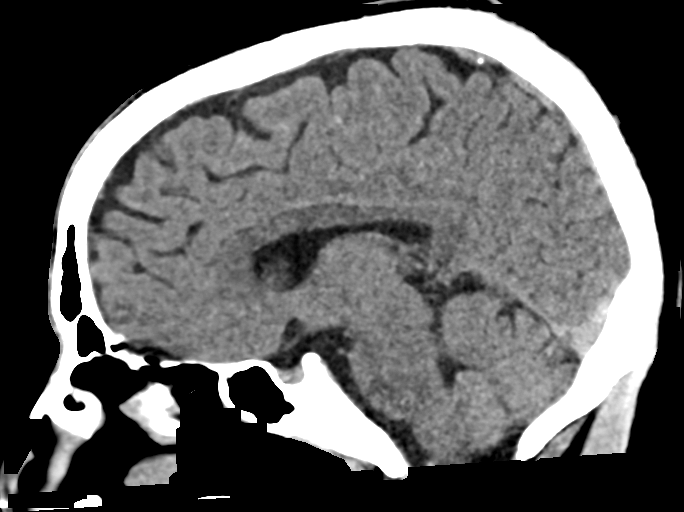
[im 39/59  brain]
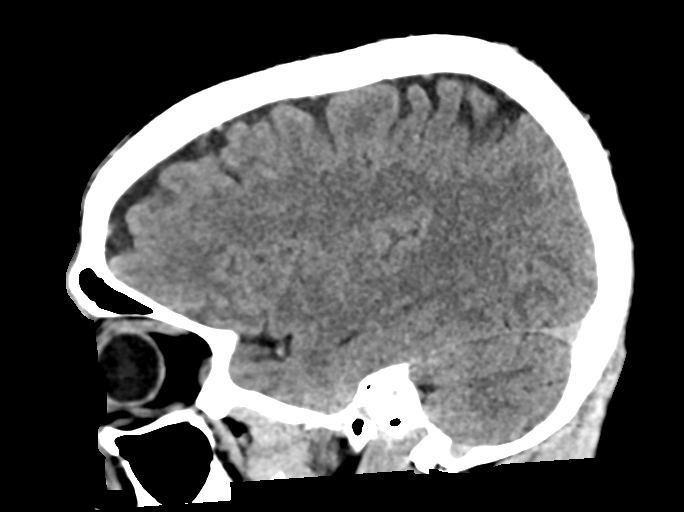

[16 of 47 positions shown; findings below may reference images not displayed]

FINDINGS: Brain: Small amount of subarachnoid hemorrhage is again seen within
the left frontal lobe, which appears unchanged from the previous CT.
No new sites of intraparenchymal or extra-axial hemorrhage. Evolving
left parietooccipital lobe infarct without evidence of hemorrhagic
transformation. Small area of low attenuation along the ventral body
of the left corpus callosum, unchanged. Prior right thalamic lacunar
infarct. No evidence of a new acute infarct. No hydrocephalus.

Vascular: Atherosclerotic calcifications involving the large vessels
of the skull base. No unexpected hyperdense vessel.

Skull: Negative for acute calvarial fracture or focal lesion.
Bilateral nasal bone fractures are unchanged.

Sinuses/Orbits: No acute finding.

Other: None.
IMPRESSION: 1. No significant interval change in small amount of subarachnoid
hemorrhage within the left frontal lobe.
2. Evolving left parietooccipital lobe infarct without evidence of
hemorrhagic transformation.
3. Unchanged small area of low attenuation along the ventral body of
the left corpus callosum.
4. Bilateral nasal bone fractures are unchanged.

## 2020-10-14 MED ORDER — OXYCODONE HCL 5 MG PO TABS
5.0000 mg | ORAL_TABLET | Freq: Three times a day (TID) | ORAL | Status: DC | PRN
  Administered 2020-10-14 – 2020-10-15 (×4): 5 mg via ORAL
  Filled 2020-10-14 (×4): qty 1

## 2020-10-14 MED ORDER — BUSPIRONE HCL 10 MG PO TABS
5.0000 mg | ORAL_TABLET | Freq: Three times a day (TID) | ORAL | Status: DC
  Administered 2020-10-14 – 2020-10-15 (×4): 5 mg via ORAL
  Filled 2020-10-14 (×4): qty 1

## 2020-10-14 MED ORDER — LORAZEPAM 0.5 MG PO TABS
0.5000 mg | ORAL_TABLET | Freq: Two times a day (BID) | ORAL | Status: DC | PRN
  Administered 2020-10-14 – 2020-10-15 (×3): 0.5 mg via ORAL
  Filled 2020-10-14 (×3): qty 1

## 2020-10-14 NOTE — Progress Notes (Signed)
Pt called out stating that he was in pain and needed pain medication. Stated that pain was 10/10 L side of his head. MD notified. No new PRN orders. He just received Oxycodone 5mg  around 1400. I told him he wouldn't be able to get his next dose until 2200. He started to get agitated and stated "i'm not going to stay here hurting. I want to go home". Attempted to explain to patient but it just irritated him more. MD notified of pt wanting to leave AMA. This nurse brought the [facility] paperwork but patient refused to sign at that moment. I left the room to give him space to calm down. When I returned pt was talking on the phone with wife stating "I'll stay here until tomorrow morning." Confirmed with patient that he was willing to stay tonight and he said yes. MD notified.

## 2020-10-14 NOTE — Progress Notes (Signed)
Pt calling this nurse into room, stated that he fell when he was in the bathroom, stated he hit the floor, indicated his left forehead made contact with the floor and area is in pain. Additional neuro assessment completed, no change noted from previous assessment. message placed to Dr.Chotiner. Awaiting call back

## 2020-10-14 NOTE — Progress Notes (Signed)
Pt complaining of 9/10 pain Left side of his head as well as constant chest pain. Also Stated "I need some anxiety medications now". This nurse notified Dr. Alanda Slim. New orders in.

## 2020-10-14 NOTE — Progress Notes (Signed)
PROGRESS NOTE  Alejandro Perez BCW:[id number] DOB: Nov 26, 1975   PCP: Pcp, No  Patient is from: Home  DOA: 10/10/2020 LOS: 3  Chief complaints: Chest pain  Brief Narrative / Interim history: 44 year old male with history of recent left MCA CVA, TTP s/p splenectomy, HTN, LVH and polysubstance use (cocaine, marijuana and tobacco) returning with chest pain.  Recently evaluated for this.  CTA chest was negative for dissection or other significant finding.  UDS was positive for cocaine.   He also had left-sided headache, blurry vision and expressive aphasia. CT head showed new hypoattenuation along the ventral body of the left corpus callosum eccentric to the left suspicious for acute subacute infarct.  Neurology consulted, and MRI ordered.  UDS positive for opiates and marijuana but negative for cocaine.  MRI revealed acute left ACA and patchy left MCA parieto-occipital infarct with late subacute left MCA infarct thought to be cardioembolic.  Neurology felt he is not a good long-term anticoagulation candidate given history of noncompliance and polysubstance abuse, and recommended Plavix and low-dose aspirin for 3 weeks followed by Plavix alone.   Cardiology was consulted about his chest pain, and felt the pain to be nonanginal and signed off.  His EKG was not diagnostic and was consistent with LVH with repolarization.  Therapy recommended CIR.  Subjective: Patient had an accidental fall last night when he got out of the bed to go to the bathroom.  He hit his head.  Unsure if he had LOC.  Reportedly, he was unpleasant to the staff and cursing.  He has not had new focal neuro deficit.  CT head revealed new small subarachnoid hemorrhage in the left frontal lobe.  His aspirin and Plavix discontinued.  Repeat CT ordered for this morning.  This morning, he complains of left-sided headache.  He says he got out of the bed and fell because staff didn't respond to his call on time.  Patient's wife at  bedside.  Objective: Vitals:   10/13/20 2340 10/14/20 0319 10/14/20 0850 10/14/20 1215  BP: 131/79 135/75 (!) 163/89 (!) 118/99  Pulse: 76 63 66 (!) 57  Resp: 18 18 20 16   Temp: 98.1 F (36.7 C) 97.7 F (36.5 C) 97.6 F (36.4 C) 98 F (36.7 C)  TempSrc: Oral Oral Oral Oral  SpO2: 100% 100% 100% 100%  Weight:      Height:        Intake/Output Summary (Last 24 hours) at 10/14/2020 1231 Last data filed at 10/14/2020 1022 Gross per 24 hour  Intake --  Output 425 ml  Net -425 ml   Filed Weights   10/10/20 1317 10/11/20 1540  Weight: 73.5 kg 67 kg    Examination: GENERAL: No apparent distress.  Nontoxic. HEENT: MMM.  Vision and hearing grossly intact.  NECK: Supple.  No apparent JVD.  RESP: On room air.  No IWOB.  Fair aeration bilaterally. CVS:  RRR. Heart sounds normal.  ABD/GI/GU: BS+. Abd soft, NTND.  MSK/EXT:  Moves extremities. No apparent deformity. No edema.  SKIN: no apparent skin lesion or wound NEURO: Awake, alert and oriented appropriately.  No focal neuro deficit other than expressive aphasia. PSYCH: Calm. Normal affect.   Procedures:  None  Microbiology summarized: COVID-19 and influenza PCR nonreactive.  Assessment & Plan: Acute left ACA, left MCA infarct and subacute left MCA infarct: concern about cardioembolic etiology but not a good candidate for anticoagulation due to polysubstance use and fall risk -Neurology signed off and recommended Plavix and aspirin for 3 weeks  followed by Plavix alone -Plavix and aspirin on hold in the setting of new subarachnoid hemorrhage after fall. -BP goal normotensive -Continue statin. -Therapy recommended CIR.  Accidental fall: Got out of the bed to go to bathroom on his own the night of 11/24.  He fell and hit his head.  CT head with new small subarachnoid hemorrhage in the left frontal lobe.  However, it is unclear if the bleed is before or after fall.  No new focal neuro deficit. -Follow repeat CT  head -Continue holding Plavix and aspirin  Atypical chest pain with abnormal EKG: Troponin negative.  Abnormal EKG fails to be due to LVH with repolarization.  TTE reassuring.  Has history of cocaine but UDS negative for now.  He treated to leave AMA unless he gets stronger pain medication but change his mind. -Cardiology signed off-no further cardiac work-up recommended. -Scheduled Tylenol with as needed tramadol  Headache/chest pain/abdominal pain-asking for IV Dilaudid: Subarachnoid hemorrhage could contribute to the headache. -Scheduled Tylenol and as needed tramadol as above -Continue Protonix and Carafate  Uncontrolled hypertension: BP within fair range for most part -Continue home amlodipine -Home metoprolol changed to carvedilol due to history of cocaine use -Resumed home clonidine at lower dose -Continue home Lasix  CKD-3A: Relatively stable. -Continue monitoring intermittently  Polysubstance use including marijuana, cocaine and tobacco -Counseled. -Offer nicotine patch  Debility/physical deconditioning -Therapy recommended CIR.  Underweight Body mass index is 18.96 kg/m.  -Consult dietitian       DVT prophylaxis:  SCDs Start: 10/11/20 2159  Code Status: Full code Family Communication: Patient and/or RN. Available if any question.  Status is: Inpatient  Remains inpatient appropriate because:Ongoing diagnostic testing needed not appropriate for outpatient work up and Unsafe d/c plan   Dispo: The patient is from: Home              Anticipated d/c is to: CIR              Anticipated d/c date is: 3 days              Patient currently is not medically stable to d/c.       Consultants:  Neurology-signed off Cardiology-signed off   Sch Meds:  Scheduled Meds:   stroke: mapping our early stages of recovery book   Does not apply Once   acetaminophen  1,000 mg Oral Q8H   alum & mag hydroxide-simeth  30 mL Oral Once   And   lidocaine  15 mL Oral Once    amLODipine  10 mg Oral Daily   atorvastatin  40 mg Oral q1800   carvedilol  6.25 mg Oral BID WC   cloNIDine  0.1 mg Oral BID   folic acid  1 mg Oral Daily   furosemide  40 mg Oral Daily   LORazepam  1 mg Oral BID   multivitamin with minerals  1 tablet Oral Daily   pantoprazole  40 mg Oral Daily   sucralfate  1 g Oral TID WC & HS   cyanocobalamin  1,000 mcg Oral Daily   Continuous Infusions: PRN Meds:.hydrALAZINE, ondansetron (ZOFRAN) IV  Antimicrobials: Anti-infectives (From admission, onward)   None       I have personally reviewed the following labs and images: CBC: Recent Labs  Lab 10/10/20 1328 10/11/20 1639 10/12/20 0414  WBC 9.2 7.1 8.5  HGB 16.5 17.6* 17.6*  HCT 45.7 48.6 47.1  MCV 85.6 86.8 82.8  PLT 491* 358 501*   BMP &GFR  Recent Labs  Lab 10/10/20 1328 10/11/20 1639 10/12/20 0414 10/14/20 0303  NA 136 135 134* 136  K 3.3* 3.8 3.0* 4.0  CL 89* 96* 90* 99  CO2 32 24 29 25   GLUCOSE 90 92 98 101*  BUN 13 14 15 16   CREATININE 1.87* 1.65* 1.67* 1.73*  CALCIUM 10.1 9.9 10.0 9.8  MG  --  2.2 2.1 2.1  PHOS  --  2.8 3.3 2.9   Estimated Creatinine Clearance: 50.6 mL/min (A) (by C-G formula based on SCr of 1.73 mg/dL (H)). Liver & Pancreas: Recent Labs  Lab 10/10/20 1328 10/11/20 1639 10/12/20 0414 10/14/20 0303  AST 36 73* 66*  --   ALT 65* 82* 93*  --   ALKPHOS 120 133* 162*  --   BILITOT 1.0 0.9 1.2  --   PROT 6.9 7.3 7.7  --   ALBUMIN 4.5 4.4 4.5 4.0   Recent Labs  Lab 10/10/20 1328  LIPASE 31   No results for input(s): AMMONIA in the last 168 hours. Diabetic: No results for input(s): HGBA1C in the last 72 hours. No results for input(s): GLUCAP in the last 168 hours. Cardiac Enzymes: No results for input(s): CKTOTAL, CKMB, CKMBINDEX, TROPONINI in the last 168 hours. No results for input(s): PROBNP in the last 8760 hours. Coagulation Profile: No results for input(s): INR, PROTIME in the last 168 hours. Thyroid Function  Tests: No results for input(s): TSH, T4TOTAL, FREET4, T3FREE, THYROIDAB in the last 72 hours. Lipid Profile: No results for input(s): CHOL, HDL, LDLCALC, TRIG, CHOLHDL, LDLDIRECT in the last 72 hours. Anemia Panel: No results for input(s): VITAMINB12, FOLATE, FERRITIN, TIBC, IRON, RETICCTPCT in the last 72 hours. Urine analysis:    Component Value Date/Time   COLORURINE YELLOW 10/08/2019 0456   APPEARANCEUR CLEAR 10/08/2019 0456   LABSPEC 1.013 10/08/2019 0456   PHURINE 6.0 10/08/2019 0456   GLUCOSEU NEGATIVE 10/08/2019 0456   HGBUR NEGATIVE 10/08/2019 0456   BILIRUBINUR NEGATIVE 10/08/2019 0456   KETONESUR NEGATIVE 10/08/2019 0456   PROTEINUR NEGATIVE 10/08/2019 0456   NITRITE NEGATIVE 10/08/2019 0456   LEUKOCYTESUR NEGATIVE 10/08/2019 0456   Sepsis Labs: Invalid input(s): PROCALCITONIN, LACTICIDVEN  Microbiology: Recent Results (from the past 240 hour(s))  Respiratory Panel by RT PCR (Flu A&B, Covid) - Nasopharyngeal Swab     Status: None   Collection Time: 10/10/20  5:13 PM   Specimen: Nasopharyngeal Swab; Nasopharyngeal(NP) swabs in vial transport medium  Result Value Ref Range Status   SARS Coronavirus 2 by RT PCR NEGATIVE NEGATIVE Final    Comment: (NOTE) SARS-CoV-2 target nucleic acids are NOT DETECTED.  The SARS-CoV-2 RNA is generally detectable in upper respiratoy specimens during the acute phase of infection. The lowest concentration of SARS-CoV-2 viral copies this assay can detect is 131 copies/mL. A negative result does not preclude SARS-Cov-2 infection and should not be used as the sole basis for treatment or other patient management decisions. A negative result may occur with  improper specimen collection/handling, submission of specimen other than nasopharyngeal swab, presence of viral mutation(s) within the areas targeted by this assay, and inadequate number of viral copies (<131 copies/mL). A negative result must be combined with clinical observations,  patient history, and epidemiological information. The expected result is Negative.  Fact Sheet for Patients:  https://www.moore.com/https://www.fda.gov/media/142436/download  Fact Sheet for Healthcare Providers:  https://www.young.biz/https://www.fda.gov/media/142435/download  This test is no t yet approved or cleared by the [place] FDA and  has been authorized for detection and/or diagnosis of SARS-CoV-2 by FDA  under an Emergency Use Authorization (EUA). This EUA will remain  in effect (meaning this test can be used) for the duration of the COVID-19 declaration under Section 564(b)(1) of the Act, 21 U.S.C. section 360bbb-3(b)(1), unless the authorization is terminated or revoked sooner.     Influenza A by PCR NEGATIVE NEGATIVE Final   Influenza B by PCR NEGATIVE NEGATIVE Final    Comment: (NOTE) The Xpert Xpress SARS-CoV-2/FLU/RSV assay is intended as an aid in  the diagnosis of influenza from Nasopharyngeal swab specimens and  should not be used as a sole basis for treatment. Nasal washings and  aspirates are unacceptable for Xpert Xpress SARS-CoV-2/FLU/RSV  testing.  Fact Sheet for Patients: https://www.moore.com/  Fact Sheet for Healthcare Providers: https://www.young.biz/  This test is not yet approved or cleared by the [place] FDA and  has been authorized for detection and/or diagnosis of SARS-CoV-2 by  FDA under an Emergency Use Authorization (EUA). This EUA will remain  in effect (meaning this test can be used) for the duration of the  Covid-19 declaration under Section 564(b)(1) of the Act, 21  U.S.C. section 360bbb-3(b)(1), unless the authorization is  terminated or revoked. Performed at [facility], [address] [place], [place] [address]     Radiology Studies: CT HEAD WO CONTRAST  Result Date: 10/14/2020 CLINICAL DATA:  Follow-up subarachnoid hemorrhage EXAM: CT HEAD WITHOUT CONTRAST TECHNIQUE: Contiguous axial images were obtained from the  base of the skull through the vertex without intravenous contrast. COMPARISON:  10/13/2020, 10/10/2020 FINDINGS: Brain: Small amount of subarachnoid hemorrhage is again seen within the left frontal lobe, which appears unchanged from the previous CT. No new sites of intraparenchymal or extra-axial hemorrhage. Evolving left parietooccipital lobe infarct without evidence of hemorrhagic transformation. Small area of low attenuation along the ventral body of the left corpus callosum, unchanged. Prior right thalamic lacunar infarct. No evidence of a new acute infarct. No hydrocephalus. Vascular: Atherosclerotic calcifications involving the large vessels of the skull base. No unexpected hyperdense vessel. Skull: Negative for acute calvarial fracture or focal lesion. Bilateral nasal bone fractures are unchanged. Sinuses/Orbits: No acute finding. Other: None. IMPRESSION: 1. No significant interval change in small amount of subarachnoid hemorrhage within the left frontal lobe. 2. Evolving left parietooccipital lobe infarct without evidence of hemorrhagic transformation. 3. Unchanged small area of low attenuation along the ventral body of the left corpus callosum. 4. Bilateral nasal bone fractures are unchanged. Electronically Signed   By: Duanne Guess D.O.   On: 10/14/2020 12:21   CT HEAD WO CONTRAST  Result Date: 10/13/2020 CLINICAL DATA:  Status post fall. EXAM: CT HEAD WITHOUT CONTRAST TECHNIQUE: Contiguous axial images were obtained from the base of the skull through the vertex without intravenous contrast. COMPARISON:  MRI head and plain brain CT, dated October 10, 2020. FINDINGS: Brain: There is mild cerebral atrophy with widening of the extra-axial spaces and ventricular dilatation. There are areas of decreased attenuation within the white matter tracts of the supratentorial brain, consistent with microvascular disease changes. A small amount of acute subarachnoid blood is seen within the left frontal lobe.  This represents a new finding when compared to the prior study. There is no evidence of associated mass effect or midline shift. Areas of cortical and white matter low attenuation are again seen involving the parietooccipital region on the left. A small chronic right para thalamic lacunar infarct is seen. A small area of low attenuation is seen along the ventral body of the corpus callosum  on the left. This is seen on the prior plain brain CT and prior MR head and is unchanged in appearance. Vascular: No hyperdense vessel or unexpected calcification. Skull: Normal. Negative for fracture or focal lesion. Sinuses/Orbits: No acute finding. Other: None. IMPRESSION: 1. Small acute left frontal lobe subarachnoid hemorrhage. This represents a new finding when compared to the prior study. 2. Stable left parieto-occipital ischemic infarct. 3. Small, stable acute ischemic nonhemorrhagic left ACA territory infarct. Electronically Signed   By: Aram Candela M.D.   On: 10/13/2020 22:58      Aaryn Parrilla T. Ovidio Steele [facility]  If 7PM-7AM, please contact night-coverage [email or web address] 10/14/2020, 12:31 PM

## 2020-10-14 NOTE — Progress Notes (Signed)
   10/13/20 2200  What Happened  Was fall witnessed? No  Was patient injured? Yes  Patient found other (Comment) (in bed)  Found by Staff-comment  Stated prior activity bathroom-unassisted  Follow Up  MD notified Dr Rachael Darby  Time MD notified 2138  Family notified No - patient refusal  Additional tests Yes-comment  Simple treatment Other (comment) (CT)  Adult Fall Risk Assessment  Risk Factor Category (scoring not indicated) Fall has occurred during this admission (document High fall risk)  Age 44  Fall History: Fall within 6 months prior to admission 0  Elimination; Bowel and/or Urine Incontinence 0  Elimination; Bowel and/or Urine Urgency/Frequency 0  Medications: includes PCA/Opiates, Anti-convulsants, Anti-hypertensives, Diuretics, Hypnotics, Laxatives, Sedatives, and Psychotropics 5  Patient Care Equipment 1  Mobility-Assistance 2  Mobility-Gait 2  Mobility-Sensory Deficit 2  Altered awareness of immediate physical environment 0  Impulsiveness 2  Lack of understanding of one's physical/cognitive limitations 4  Total Score 18  Patient Fall Risk Level High fall risk  Adult Fall Risk Interventions  Required Bundle Interventions *See Row Information* High fall risk - low, moderate, and high requirements implemented  Additional Interventions Use of appropriate toileting equipment (bedpan, BSC, etc.)  Screening for Fall Injury Risk (To be completed on HIGH fall risk patients) - Assessing Need for Low Bed  Risk For Fall Injury- Low Bed Criteria None identified - Continue screening  Screening for Fall Injury Risk (To be completed on HIGH fall risk patients who do not meet crieteria for Low Bed) - Assessing Need for Floor Mats Only  Risk For Fall Injury- Criteria for Floor Mats Noncompliant with safety precautions  Will Implement Floor Mats Yes  Pain Assessment  Pain Scale 0-10  Pain Score 6  Pain Type Acute pain  Pain Location Head  Pain Orientation Anterior;Left;Upper  Pain  Descriptors / Indicators Headache  Pain Frequency Constant  Pain Onset On-going  Patients Stated Pain Goal 3  Pain Intervention(s) MD notified (Comment)

## 2020-10-14 NOTE — Progress Notes (Addendum)
   10/13/20 2140  Neurological  Neuro (WDL) X  Orientation Level Oriented X4  Cognition Poor judgement;Impulsive;Poor safety awareness;Follows commands  Speech Expressive aphasia  Pupil Assessment  Yes  R Pupil Size (mm) 3  R Pupil Shape Round  R Pupil Reaction Brisk  L Pupil Size (mm) 3  L Pupil Shape Round  L Pupil Reaction Brisk  Additional Pupil Assessments No  Motor Function/Sensation Assessment Grip;Pronator drift;Dorsiflexion;Motor response;Motor strength;Plantar flexion  Facial Symmetry Symmetrical  R Hand Grip Strong  L Hand Grip Strong  Right Pronator Drift Absent  Left Pronator Drift Absent  R Foot Dorsiflexion Strong  L Foot Dorsiflexion Strong  R Foot Plantar Flexion Strong  L Foot Plantar Flexion Strong  RUE Motor Response Purposeful movement  RUE Sensation Full sensation  RUE Motor Strength 5  LUE Motor Response Purposeful movement  LUE Sensation Full sensation  LUE Motor Strength 5  RLE Motor Response Purposeful movement  RLE Sensation Numbness;Tingling  RLE Motor Strength 5  LLE Motor Response Purposeful movement  LLE Sensation Full sensation  LLE Motor Strength 5  Neuro Symptoms Agitation  Glasgow Coma Scale  Eye Opening 4  Best Verbal Response (NON-intubated) 5  Best Motor Response 6  Glasgow Coma Scale Score 15  Musculoskeletal  Musculoskeletal (WDL) X  Assistive Device BSC  Generalized Weakness Yes  Weight Bearing Restrictions No  Integumentary  Integumentary (WDL) WDL  Skin Color Appropriate for ethnicity  Skin Condition Dry  Skin Integrity Intact  Skin Turgor Non-tenting

## 2020-10-14 NOTE — Progress Notes (Signed)
Pt going to CT for repeat imaging.

## 2020-10-15 DIAGNOSIS — I609 Nontraumatic subarachnoid hemorrhage, unspecified: Secondary | ICD-10-CM | POA: Diagnosis not present

## 2020-10-15 DIAGNOSIS — R109 Unspecified abdominal pain: Secondary | ICD-10-CM

## 2020-10-15 DIAGNOSIS — R531 Weakness: Secondary | ICD-10-CM

## 2020-10-15 DIAGNOSIS — R079 Chest pain, unspecified: Secondary | ICD-10-CM | POA: Diagnosis not present

## 2020-10-15 DIAGNOSIS — F1920 Other psychoactive substance dependence, uncomplicated: Secondary | ICD-10-CM | POA: Diagnosis not present

## 2020-10-15 DIAGNOSIS — R4701 Aphasia: Secondary | ICD-10-CM

## 2020-10-15 DIAGNOSIS — R519 Headache, unspecified: Secondary | ICD-10-CM | POA: Diagnosis not present

## 2020-10-15 LAB — CBC
HCT: 46.5 % (ref 39.0–52.0)
Hemoglobin: 16.9 g/dL (ref 13.0–17.0)
MCH: 30.8 pg (ref 26.0–34.0)
MCHC: 36.3 g/dL — ABNORMAL HIGH (ref 30.0–36.0)
MCV: 84.7 fL (ref 80.0–100.0)
Platelets: 419 10*3/uL — ABNORMAL HIGH (ref 150–400)
RBC: 5.49 MIL/uL (ref 4.22–5.81)
RDW: 12.3 % (ref 11.5–15.5)
WBC: 7.2 10*3/uL (ref 4.0–10.5)
nRBC: 0 % (ref 0.0–0.2)

## 2020-10-15 LAB — RENAL FUNCTION PANEL
Albumin: 4.3 g/dL (ref 3.5–5.0)
Anion gap: 17 — ABNORMAL HIGH (ref 5–15)
BUN: 16 mg/dL (ref 6–20)
CO2: 22 mmol/L (ref 22–32)
Calcium: 10.1 mg/dL (ref 8.9–10.3)
Chloride: 97 mmol/L — ABNORMAL LOW (ref 98–111)
Creatinine, Ser: 1.71 mg/dL — ABNORMAL HIGH (ref 0.61–1.24)
GFR, Estimated: 49 mL/min — ABNORMAL LOW (ref >60)
Glucose, Bld: 88 mg/dL (ref 70–99)
Phosphorus: 3.6 mg/dL (ref 2.5–4.6)
Potassium: 4 mmol/L (ref 3.5–5.1)
Sodium: 136 mmol/L (ref 135–145)

## 2020-10-15 LAB — MAGNESIUM: Magnesium: 2.2 mg/dL (ref 1.7–2.4)

## 2020-10-15 MED ORDER — OXYCODONE HCL 5 MG PO TABS
5.0000 mg | ORAL_TABLET | Freq: Four times a day (QID) | ORAL | Status: DC | PRN
  Administered 2020-10-15: 10 mg via ORAL
  Filled 2020-10-15: qty 2

## 2020-10-15 MED ORDER — ENSURE ENLIVE PO LIQD
237.0000 mL | Freq: Two times a day (BID) | ORAL | Status: DC
  Administered 2020-10-15: 237 mL via ORAL

## 2020-10-15 MED ORDER — ACETAMINOPHEN 500 MG PO TABS: 1000.0000 mg | ORAL_TABLET | Freq: Three times a day (TID) | ORAL | Status: DC

## 2020-10-15 MED ORDER — BUTALBITAL-APAP-CAFFEINE 50-325-40 MG PO TABS
1.0000 | ORAL_TABLET | Freq: Four times a day (QID) | ORAL | Status: DC | PRN
  Administered 2020-10-15: 1 via ORAL
  Filled 2020-10-15: qty 1

## 2020-10-15 NOTE — Progress Notes (Signed)
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 Current MRI also shows L ACA. PMH: anemia, HTN, polysubstance abuse    PT Comments    Pt is making progress towards his goals and is very motivated to participate and improve. The pt had recently fallen on 10/13/20 while going to the bathroom without staff and hit his head. Imaging revealed a new small subarachnoid hemorrhage in the L frontal lobe. His DGI score this date of 12 further indicates that the pt is at a high risk for falls and subsequent injuries. While his balance is improving as he is staggering less he still has significant cognitive deficits that impact his safety. He was unable to find his room without max cues and he continues to display R side visual field deficits. The pt could greatly benefit from intensive therapy services in the CIR setting to maximize his independence and safety with all functional mobility as he has already fallen and sustained an injury and is at high risk for this to reoccur. Will continue to follow acutely.   Follow Up Recommendations  Supervision for mobility/OOB;CIR (if refuse/denied then neuro specialty outpatient PT)     Equipment Recommendations  None recommended by PT    Recommendations for Other Services       Precautions / Restrictions Precautions Precautions: Fall Precaution Comments: legally blind  Restrictions Weight Bearing Restrictions: No    Mobility  Bed Mobility Overal bed mobility: Modified Independent Bed Mobility: Supine to Sit;Sit to Supine     Supine to sit: Modified independent (Device/Increase time);HOB elevated Sit to supine: Modified independent (Device/Increase time)   General bed mobility  comments: Mod I with use of bed rails to come to sit safely.   Transfers Overall transfer level: Needs assistance Equipment used: None Transfers: Sit to/from Stand Sit to Stand: Min assist         General transfer comment: Pt able to power up to stand in appropriate amount of time but dsiplays unsteadiness, thus minA for safety.  Ambulation/Gait Ambulation/Gait assistance: Min assist Gait Distance (Feet): 350 Feet Assistive device: None Gait Pattern/deviations: Narrow base of support;Drifts right/left;Decreased stride length;Decreased step length - right Gait velocity: decreased Gait velocity interpretation: 1.31 - 2.62 ft/sec, indicative of limited community ambulator General Gait Details: Initially decreased R step length, but improved with cues. Pt staggers laterally on occasion and requires intermittent minA to recover balance. Bouts of only min guard assist when ambulating straight on even surface without LOB bout.    Stairs Stairs: Yes Stairs assistance: Min guard Stair Management: Two rails;Alternating pattern;Forwards;Step to pattern Number of Stairs: 4 General stair comments: No LOB noted, min guard assist and verbal cues provided to turn around at top of steps to begin descent.   Wheelchair Mobility    Modified Rankin (Stroke Patients Only) Modified Rankin (Stroke Patients Only) Pre-Morbid Rankin Score: Moderately severe disability Modified Rankin: Moderately severe disability     Balance Overall balance assessment: Needs assistance Sitting-balance support: No upper extremity supported;Feet supported Sitting balance-Leahy Scale: Fair Sitting balance - Comments: Static sitting no LOB, supervision for safety.   Standing balance support: No upper extremity supported Standing balance-Leahy Scale: Fair Standing balance comment: Able to ambulate without UE support, but intermittent min LOB laterally with  minA for safety.                 Standardized  Balance Assessment Standardized Balance Assessment : Dynamic Gait Index   Dynamic Gait Index Level Surface: Moderate Impairment Change in Gait Speed: Mild Impairment Gait with Horizontal Head Turns: Mild Impairment Gait with Vertical Head Turns: Mild Impairment Gait and Pivot Turn: Mild Impairment Step Over Obstacle: Moderate Impairment Step Around Obstacles: Severe Impairment Steps: Mild Impairment Total Score: 12      Cognition Arousal/Alertness: Awake/alert Behavior During Therapy: WFL for tasks assessed/performed Overall Cognitive Status: Impaired/Different from baseline Area of Impairment: Safety/judgement;Awareness;Problem solving;Memory                     Memory: Decreased short-term memory   Safety/Judgement: Decreased awareness of safety;Decreased awareness of deficits Awareness: Emergent Problem Solving: Slow processing;Decreased initiation;Requires verbal cues General Comments: Pt cued to find his room after ambulating and making several turns. He passed his room 3x, even as cues were increased to look towards the R to look at room numbers and even when provided his room number. Cued pt to look at hospital signs to determine location. MaxA to find room. Pt unaware of his deficits and safety concerns as he continues to be inattentive to R side.      Exercises      General Comments        Pertinent Vitals/Pain Pain Assessment: Faces Faces Pain Scale: No hurt Pain Intervention(s): Limited activity within patient's tolerance;Monitored during session;Repositioned    Home Living                      Prior Function            PT Goals (current goals can now be found in the care plan section) Acute Rehab PT Goals Patient Stated Goal: to go to CIR PT Goal Formulation: With patient/family Time For Goal Achievement: 10/26/20 Potential to Achieve Goals: Good Progress towards PT goals: Progressing toward goals    Frequency    Min  4X/week      PT Plan Current plan remains appropriate    Co-evaluation              AM-PAC PT "6 Clicks" Mobility   Outcome Measure  Help needed turning from your back to your side while in a flat bed without using bedrails?: None Help needed moving from lying on your back to sitting on the side of a flat bed without using bedrails?: None Help needed moving to and from a bed to a chair (including a wheelchair)?: A Little Help needed standing up from a chair using your arms (e.g., wheelchair or bedside chair)?: A Little Help needed to walk in hospital room?: A Little Help needed climbing 3-5 steps with a railing? : A Little 6 Click Score: 20    End of Session Equipment Utilized During Treatment: Gait belt Activity Tolerance: Patient tolerated treatment well Patient left: in bed;with call bell/phone within reach;with bed alarm set;with family/visitor present (wife) Nurse Communication: Mobility status;Precautions PT Visit Diagnosis: Other abnormalities of gait and mobility (R26.89);Muscle weakness (generalized) (M62.81);Unsteadiness on feet (R26.81);Other symptoms and signs involving the nervous system (R29.898);Pain;Repeated falls (R29.6)     Time: 4503-8882 PT Time Calculation (min) (ACUTE ONLY): 18 min  Charges:  $Gait Training: 8-22 mins                     Raymond Gurney, PT, DPT Acute Rehabilitation Services  Pager: [phone] Office: [phone]    Henrene Dodge Pettis 10/15/2020, 3:06 PM

## 2020-10-15 NOTE — Progress Notes (Addendum)
Pt upset throughout shift d/t stated inadequate pain regimen. Pt c/o headache pain. Pt refused to take tylenol so MD Gonfa added PRN Fioricet and removed PRN Tylenol. Pt stated fioricet did not relieve any pain. Pt stated he wanted his oxycodone. Informed pt that it will be given when it is due as it was changed Q8h to Q6h. MD Alanda Slim informed of pt's discontent and pain throughout shift. Page sent to MD Alanda Slim: "Pt is upset with current pain regimen, still refusing to try tylenol. I updated him that tylenol was added and oxycodone dose and frequency was increased. He is requesting to speak with you. Are you able to speak with him?" with response from MD Gonfa: "I will talk to him when I can but he need to take what has been prescribed. Pain is not new. Thanks!" F/u page send to MD Gonfa: "Pt is very agitated, requesting a new doctor. Can you or the night shift MD come see him?" MD Gonfa did not speak with pt after page sent requesting such.  PRN 10mg  oxycodone given for pain at change of shift and pt still upset and requesting a new doctor due to perceived unmanaged pain regimen. Upon hand off of care, pt demanding to speak with a new doctor and pt's new nurse that had been given handoff report- Tutu informed pt that he will be unable to speak with the doctor until the morning when the morning doctor does his rounding. At that, pt decided to leave AMA. Pt's wife at bedside. Pt signed AMA paper and RN Tutu collected. Pt removed his own IV and ambulated without difficulty leaving unit AMA with his wife, stating he will be going to another hospital that will give him pain medications.   Attempted to contact night hospitalist unsuccessfully. RN Tutu agreed to inform hospitalist of pt's AMA departure.

## 2020-10-15 NOTE — Progress Notes (Signed)
PROGRESS NOTE  Alejandro Perez [id number]:[id number] DOB: 09/08/1976   PCP: Pcp, No  Patient is from: Home  DOA: 10/10/2020 LOS: 4  Chief complaints: Chest pain  Brief Narrative / Interim history: 44 year old male with history of recent left MCA CVA, TTP s/p splenectomy, HTN, LVH and polysubstance use (cocaine, marijuana and tobacco) returning with chest pain.  Recently evaluated for this.  CTA chest was negative for dissection or other significant finding.  UDS was positive for cocaine.   He also had left-sided headache, blurry vision and expressive aphasia. CT head showed new hypoattenuation along the ventral body of the left corpus callosum eccentric to the left suspicious for acute subacute infarct.  Neurology consulted, and MRI ordered.  UDS positive for opiates and marijuana but negative for cocaine.  MRI revealed acute left ACA and patchy left MCA parieto-occipital infarct with late subacute left MCA infarct thought to be cardioembolic.  Neurology felt he is not a good long-term anticoagulation candidate given history of noncompliance and polysubstance abuse, and recommended Plavix and low-dose aspirin for 3 weeks followed by Plavix alone.   Cardiology was consulted about his chest pain, and felt the pain to be nonanginal and signed off.  His EKG was not diagnostic and was consistent with LVH with repolarization.  Patient had a fall after he got out of the bed to go to the bathroom the night of 11/24.  CT head without contrast showed a small subarachnoid hemorrhage in the left frontal lobe.  Aspirin and Plavix held.  Subarachnoid hemorrhage are stable on repeat CT the next day.  Therapy recommended CIR.  Subjective: Seen and examined earlier this morning.  No major events overnight of this morning.  Complains left-sided headache.  He describes the pain as aching.  He has no photophobia or nuchal rigidity but reports pain with extraocular eye movement exam.  Still has blurry vision from both  eyes.  Still with aphasia.  Overall, not a great historian partly due to his frustration with his aphasia.  Objective: Vitals:   10/14/20 2100 10/15/20 0000 10/15/20 0350 10/15/20 0932  BP: 133/89 133/81 (!) 143/94 (!) 155/84  Pulse: 62 63 (!) 53 61  Resp: 16 16 16 18   Temp: (!) 97.4 F (36.3 C) 97.7 F (36.5 C) 97.6 F (36.4 C) 97.9 F (36.6 C)  TempSrc: Oral Oral Oral Oral  SpO2: 100% 100% 100% 100%  Weight:      Height:        Intake/Output Summary (Last 24 hours) at 10/15/2020 1119 Last data filed at 10/14/2020 2100 Gross per 24 hour  Intake 692 ml  Output 1050 ml  Net -358 ml   Filed Weights   10/10/20 1317 10/11/20 1540  Weight: 73.5 kg 67 kg    Examination:  GENERAL: No apparent distress.  Nontoxic. HEENT: MMM.  Vision and hearing grossly intact.  NECK: Supple.  No apparent JVD.  RESP:  No IWOB.  Fair aeration bilaterally. CVS:  RRR. Heart sounds normal.  ABD/GI/GU: BS+. Abd soft, NTND.  MSK/EXT:  Moves extremities. No apparent deformity. No edema.  SKIN: no apparent skin lesion or wound NEURO: Awake, alert and oriented appropriately.  No apparent focal neuro deficit other than expressive aphasia, diminished vision from both eyes and some bilateral lower extremity weakness PSYCH: Calm. Normal affect.  Procedures:  None  Microbiology summarized: COVID-19 and influenza PCR nonreactive.  Assessment & Plan: Acute left ACA, left MCA infarct and subacute left MCA infarct: concern about cardioembolic etiology but not a  good candidate for anticoagulation due to polysubstance use and fall risk -Neurology signed off and recommended Plavix and aspirin for 3 weeks followed by Plavix alone -Plavix and aspirin on hold in the setting of new subarachnoid hemorrhage after fall. -BP goal normotensive -Continue statin. -Therapy recommended CIR.  Accidental fall: Got out of the bed to go to bathroom on his own the night of 11/24.  He fell and hit his head.  CT head with  new small subarachnoid hemorrhage in the left frontal lobe.  However, it is unclear if the bleed is from fall or DAPT prior to fall.  Subarachnoid hemorrhage stable on repeat CT. -Continue holding Plavix and aspirin  Atypical chest pain with abnormal EKG: Troponin negative.  Abnormal EKG fails to be due to LVH with repolarization.  TTE reassuring.  Has history of cocaine but UDS negative for now.  He treated to leave AMA unless he gets stronger pain medication but change his mind. -Cardiology signed off-no further cardiac work-up recommended. -Scheduled Tylenol with as needed tramadol  Left-sided headache-subarachnoid hemorrhage could contribute to the headache but he has been endorsing headache before this.  He frequently asks for IV pain medication and threatens to leave AMA if he does not get it but never did.  He has received IV Dilaudid here and there intermittently.  Difficult to know how much pain he is in.  -Try Fioricet -Continue as needed oxycodone  Uncontrolled hypertension: BP within fair range for most part -Continue home amlodipine -Home metoprolol changed to carvedilol due to history of cocaine use -Continue home clonidine at reduced dose. -Continue home Lasix  CKD-3A: Relatively stable. -Continue monitoring intermittently  Polysubstance use including marijuana, cocaine and tobacco -Counseled. -Offer nicotine patch  Debility/physical deconditioning -Therapy recommended CIR.  Underweight Body mass index is 18.96 kg/m.  -Consult dietitian       DVT prophylaxis:  SCDs Start: 10/11/20 2159  Code Status: Full code Family Communication: Patient and/or RN. Available if any question.  Status is: Inpatient  Remains inpatient appropriate because:Ongoing diagnostic testing needed not appropriate for outpatient work up and Unsafe d/c plan   Dispo: The patient is from: Home              Anticipated d/c is to: CIR              Anticipated d/c date is: 2 days               Patient currently is medically stable to d/c.       Consultants:  Neurology-signed off Cardiology-signed off   Sch Meds:  Scheduled Meds: .  stroke: mapping our early stages of recovery book   Does not apply Once  . alum & mag hydroxide-simeth  30 mL Oral Once   And  . lidocaine  15 mL Oral Once  . amLODipine  10 mg Oral Daily  . atorvastatin  40 mg Oral q1800  . busPIRone  5 mg Oral TID  . carvedilol  6.25 mg Oral BID WC  . cloNIDine  0.1 mg Oral BID  . folic acid  1 mg Oral Daily  . furosemide  40 mg Oral Daily  . multivitamin with minerals  1 tablet Oral Daily  . pantoprazole  40 mg Oral Daily  . sucralfate  1 g Oral TID WC & HS  . cyanocobalamin  1,000 mcg Oral Daily   Continuous Infusions: PRN Meds:.butalbital-acetaminophen-caffeine, hydrALAZINE, LORazepam, ondansetron (ZOFRAN) IV, oxyCODONE  Antimicrobials: Anti-infectives (From admission, onward)  None       I have personally reviewed the following labs and images: CBC: Recent Labs  Lab 10/10/20 1328 10/11/20 1639 10/12/20 0414 10/15/20 0350  WBC 9.2 7.1 8.5 7.2  HGB 16.5 17.6* 17.6* 16.9  HCT 45.7 48.6 47.1 46.5  MCV 85.6 86.8 82.8 84.7  PLT 491* 358 501* 419*   BMP &GFR Recent Labs  Lab 10/10/20 1328 10/11/20 1639 10/12/20 0414 10/14/20 0303 10/15/20 0350  NA 136 135 134* 136 136  K 3.3* 3.8 3.0* 4.0 4.0  CL 89* 96* 90* 99 97*  CO2 32 24 29 25 22   GLUCOSE 90 92 98 101* 88  BUN 13 14 15 16 16   CREATININE 1.87* 1.65* 1.67* 1.73* 1.71*  CALCIUM 10.1 9.9 10.0 9.8 10.1  MG  --  2.2 2.1 2.1 2.2  PHOS  --  2.8 3.3 2.9 3.6   Estimated Creatinine Clearance: 51.2 mL/min (A) (by C-G formula based on SCr of 1.71 mg/dL (H)). Liver & Pancreas: Recent Labs  Lab 10/10/20 1328 10/11/20 1639 10/12/20 0414 10/14/20 0303 10/15/20 0350  AST 36 73* 66*  --   --   ALT 65* 82* 93*  --   --   ALKPHOS 120 133* 162*  --   --   BILITOT 1.0 0.9 1.2  --   --   PROT 6.9 7.3 7.7  --   --   ALBUMIN  4.5 4.4 4.5 4.0 4.3   Recent Labs  Lab 10/10/20 1328  LIPASE 31   No results for input(s): AMMONIA in the last 168 hours. Diabetic: No results for input(s): HGBA1C in the last 72 hours. No results for input(s): GLUCAP in the last 168 hours. Cardiac Enzymes: No results for input(s): CKTOTAL, CKMB, CKMBINDEX, TROPONINI in the last 168 hours. No results for input(s): PROBNP in the last 8760 hours. Coagulation Profile: No results for input(s): INR, PROTIME in the last 168 hours. Thyroid Function Tests: No results for input(s): TSH, T4TOTAL, FREET4, T3FREE, THYROIDAB in the last 72 hours. Lipid Profile: No results for input(s): CHOL, HDL, LDLCALC, TRIG, CHOLHDL, LDLDIRECT in the last 72 hours. Anemia Panel: No results for input(s): VITAMINB12, FOLATE, FERRITIN, TIBC, IRON, RETICCTPCT in the last 72 hours. Urine analysis:    Component Value Date/Time   COLORURINE YELLOW 10/08/2019 0456   APPEARANCEUR CLEAR 10/08/2019 0456   LABSPEC 1.013 10/08/2019 0456   PHURINE 6.0 10/08/2019 0456   GLUCOSEU NEGATIVE 10/08/2019 0456   HGBUR NEGATIVE 10/08/2019 0456   BILIRUBINUR NEGATIVE 10/08/2019 0456   KETONESUR NEGATIVE 10/08/2019 0456   PROTEINUR NEGATIVE 10/08/2019 0456   NITRITE NEGATIVE 10/08/2019 0456   LEUKOCYTESUR NEGATIVE 10/08/2019 0456   Sepsis Labs: Invalid input(s): PROCALCITONIN, LACTICIDVEN  Microbiology: Recent Results (from the past 240 hour(s))  Respiratory Panel by RT PCR (Flu A&B, Covid) - Nasopharyngeal Swab     Status: None   Collection Time: 10/10/20  5:13 PM   Specimen: Nasopharyngeal Swab; Nasopharyngeal(NP) swabs in vial transport medium  Result Value Ref Range Status   SARS Coronavirus 2 by RT PCR NEGATIVE NEGATIVE Final    Comment: (NOTE) SARS-CoV-2 target nucleic acids are NOT DETECTED.  The SARS-CoV-2 RNA is generally detectable in upper respiratoy specimens during the acute phase of infection. The lowest concentration of SARS-CoV-2 viral copies this  assay can detect is 131 copies/mL. A negative result does not preclude SARS-Cov-2 infection and should not be used as the sole basis for treatment or other patient management decisions. A negative result may occur  with  improper specimen collection/handling, submission of specimen other than nasopharyngeal swab, presence of viral mutation(s) within the areas targeted by this assay, and inadequate number of viral copies (<131 copies/mL). A negative result must be combined with clinical observations, patient history, and epidemiological information. The expected result is Negative.  Fact Sheet for Patients:  https://www.moore.com/https://www.fda.gov/media/142436/download  Fact Sheet for Healthcare Providers:  https://www.young.biz/https://www.fda.gov/media/142435/download  This test is no t yet approved or cleared by the [place] FDA and  has been authorized for detection and/or diagnosis of SARS-CoV-2 by FDA under an Emergency Use Authorization (EUA). This EUA will remain  in effect (meaning this test can be used) for the duration of the COVID-19 declaration under Section 564(b)(1) of the Act, 21 U.S.C. section 360bbb-3(b)(1), unless the authorization is terminated or revoked sooner.     Influenza A by PCR NEGATIVE NEGATIVE Final   Influenza B by PCR NEGATIVE NEGATIVE Final    Comment: (NOTE) The Xpert Xpress SARS-CoV-2/FLU/RSV assay is intended as an aid in  the diagnosis of influenza from Nasopharyngeal swab specimens and  should not be used as a sole basis for treatment. Nasal washings and  aspirates are unacceptable for Xpert Xpress SARS-CoV-2/FLU/RSV  testing.  Fact Sheet for Patients: https://www.moore.com/https://www.fda.gov/media/142436/download  Fact Sheet for Healthcare Providers: https://www.young.biz/https://www.fda.gov/media/142435/download  This test is not yet approved or cleared by the [place] FDA and  has been authorized for detection and/or diagnosis of SARS-CoV-2 by  FDA under an Emergency Use Authorization (EUA). This EUA will  remain  in effect (meaning this test can be used) for the duration of the  Covid-19 declaration under Section 564(b)(1) of the Act, 21  U.S.C. section 360bbb-3(b)(1), unless the authorization is  terminated or revoked. Performed at [facility], [address] [place], [place] [address]     Radiology Studies: CT HEAD WO CONTRAST  Result Date: 10/14/2020 CLINICAL DATA:  Follow-up subarachnoid hemorrhage EXAM: CT HEAD WITHOUT CONTRAST TECHNIQUE: Contiguous axial images were obtained from the base of the skull through the vertex without intravenous contrast. COMPARISON:  10/13/2020, 10/10/2020 FINDINGS: Brain: Small amount of subarachnoid hemorrhage is again seen within the left frontal lobe, which appears unchanged from the previous CT. No new sites of intraparenchymal or extra-axial hemorrhage. Evolving left parietooccipital lobe infarct without evidence of hemorrhagic transformation. Small area of low attenuation along the ventral body of the left corpus callosum, unchanged. Prior right thalamic lacunar infarct. No evidence of a new acute infarct. No hydrocephalus. Vascular: Atherosclerotic calcifications involving the large vessels of the skull base. No unexpected hyperdense vessel. Skull: Negative for acute calvarial fracture or focal lesion. Bilateral nasal bone fractures are unchanged. Sinuses/Orbits: No acute finding. Other: None. IMPRESSION: 1. No significant interval change in small amount of subarachnoid hemorrhage within the left frontal lobe. 2. Evolving left parietooccipital lobe infarct without evidence of hemorrhagic transformation. 3. Unchanged small area of low attenuation along the ventral body of the left corpus callosum. 4. Bilateral nasal bone fractures are unchanged. Electronically Signed   By: Duanne GuessNicholas  Plundo D.O.   On: 10/14/2020 12:21      Jhoana Upham T. Tais Koestner [facility]  If 7PM-7AM, please contact night-coverage [email or web address] 10/15/2020, 11:19 AM

## 2020-10-15 NOTE — Progress Notes (Signed)
Initial Nutrition Assessment  DOCUMENTATION CODES:   Not applicable  INTERVENTION:  Ensure Enlive po BID, each supplement provides 350 kcal and 20 grams of protein  Continue MVI daily   NUTRITION DIAGNOSIS:   Inadequate oral intake related to decreased appetite as evidenced by energy intake < 75% for > 7 days.    GOAL:   Patient will meet greater than or equal to 90% of their needs    MONITOR:   PO intake, Supplement acceptance, Weight trends, Labs, I & O's  REASON FOR ASSESSMENT:   Consult Assessment of nutrition requirement/status  ASSESSMENT:   Pt with a h/o recent L MCA CVA, TTP s/p splenectomy, HTN, LVH and polysubstance abuse admitted with acute L ACA, L MCA infarct and subacute L MCA infarct.  Pt unavailable at time of RD visit.  Pt is noted to be a poor historian partly due to his frustration with his aphasia. Per MD, UDS positive for opiates and marijuana. Note that pt had a fall after he got OOB on 11/24 and CT revealed SAH in L frontal lobe. SAH stable now per MD.   PO intake: 15-95% x 6 recorded meals (59% average meal intake)  UOP: x24 hours  Labs reviewed. Medications: mylanta, folvite, lasix, mvi, protonix, carafate, vitamin B12  Diet Order:   Diet Order            Diet - low sodium heart healthy           Diet Heart Room service appropriate? Yes with Assist; Fluid consistency: Thin  Diet effective now                 EDUCATION NEEDS:   No education needs have been identified at this time  Skin:  Skin Assessment: Reviewed RN Assessment  Last BM:  11/24  Height:   Ht Readings from Last 1 Encounters:  10/11/20 6\' 2"  (1.88 m)    Weight:   Wt Readings from Last 1 Encounters:  10/11/20 67 kg   BMI:  Body mass index is 18.96 kg/m.  Estimated Nutritional Needs:   Kcal:  2000-2200  Protein:  100-115 grams  Fluid:  >/=2L/d    10/13/20, MS, RD, LDN RD pager number and weekend/on-call pager number located in  Amion.

## 2020-10-15 NOTE — TOC Initial Note (Addendum)
Transition of Care [facility]) - Initial/Assessment Note    Patient Details  Name: Alejandro Perez MRN: [id number] Date of Birth: 26-Nov-1975  Transition of Care [facility] CM/SW Contact:    Kermit Balo, RN Phone Number: 10/15/2020, 1:37 PM  Clinical Narrative:                 Pt denies for CIR d/t out of state medicaid. CM spoke to pt and wife and his medicaid has been changed to [place]. Cm has copy of card and provided it to CIR. They will reassess to see if pt will qualify. Will need PT/OT to resee.  TOC following.  1630: so the [place] medicaid is family planning. The pts spouse is trying to get this fixed with DSS.  Expected Discharge Plan: IP Rehab Facility Barriers to Discharge: Continued Medical Work up   Patient Goals and CMS Choice   CMS Medicare.gov Compare Post Acute Care list provided to:: Patient Choice offered to / list presented to : Patient, Spouse  Expected Discharge Plan and Services Expected Discharge Plan: IP Rehab Facility   Discharge Planning Services: CM Consult Post Acute Care Choice: IP Rehab Living arrangements for the past 2 months: Single Family Home Expected Discharge Date: 10/13/20                                    Prior Living Arrangements/Services Living arrangements for the past 2 months: Single Family Home Lives with:: Significant Other Patient language and need for interpreter reviewed:: Yes        Need for Family Participation in Patient Care: Yes (Comment) Care giver support system in place?: Yes (comment) Current home services: DME (3 in 1) Criminal Activity/Legal Involvement Pertinent to Current Situation/Hospitalization: No - Comment as needed  Activities of Daily Living      Permission Sought/Granted                  Emotional Assessment Appearance:: Appears stated age Attitude/Demeanor/Rapport: Engaged Affect (typically observed): Accepting Orientation: : Oriented to Self, Oriented to Place, Oriented to  Time, Oriented to  Situation   Psych Involvement: No (comment)  Admission diagnosis:  Chest wall pain [R07.89] Acute ischemic stroke (HCC) [I63.9] Chronic nonintractable headache, unspecified headache type [R51.9, G89.29] Patient Active Problem List   Diagnosis Date Noted  . Acute ischemic stroke (HCC) 10/10/2020  . CKD (chronic kidney disease) 09/24/2020  . (HFpEF) heart failure with preserved ejection fraction (HCC) 09/24/2020  . CVA (cerebral vascular accident) (HCC) 09/23/2020  . SOB (shortness of breath)   . 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 Nausea 10/11/2019  . Splenomegaly 10/08/2019  . Vitamin B12 deficiency   . Pulmonary HTN (HCC)   . Chest pain 12/21/2017  . Thrombocytopenia (HCC) 12/21/2017  . Spleen enlarged 12/21/2017  . Schistocytes on peripheral blood smear 12/21/2017  . Hypertension   . Anemia   . Acquired TTP   . Abdominal pain, RUQ    PCP:  Pcp, No Pharmacy:   [facility] [address] [place] - [address] [place] [address] Phone: [phone] Fax: [phone]  [facility] [place], [place] - [address] [place] [address] Phone: [phone] Fax: [phone]     Social Determinants of Health (SDOH) Interventions    Readmission Risk Interventions No flowsheet data found.

## 2020-10-15 NOTE — Progress Notes (Signed)
Inpatient Rehabilitation-Admissions Coordinator   Was notified by CM that pt now has [place] Medicaid. During verification process, it appears that this patient has family planning benefits only. Notified the pt that this type of insurance does not cover IP Rehab services. Pt became visibly upset, stating he still wants rehab but wanted me to leave him alone. AC left with CM in the room.   Will need to see how pt does with PT/OT this weekend. Noted pt was Min A/Min G on 11/24 but had a fall after that session with new small subarachnoid hemorrhage in the left frontal lobe. If pt has greater therapy needs and still wants to pursue CIR without insurance benefits, we may be able to consider him for CIR admit. My coworker Vernona Rieger will follow up tomorrow.   Cheri Rous, OTR/L  Rehab Admissions Coordinator  [phone] 10/15/2020 2:26 PM

## 2020-10-18 NOTE — Discharge Summary (Signed)
Physician Discharge Summary  Alejandro Perez:[id number] DOB: 01/19/1976 DOA: 10/10/2020  PCP: Pcp, No  Admit date: 10/10/2020 Discharge date: 10/18/2020  Admitted From: Home Disposition: Left AGAINST MEDICAL ADVICE overnight  Recommendations for Outpatient Follow-up:  1. Follow ups as below. 2. Please obtain CBC/BMP/Mag at follow up 3. Please follow up on the following pending results: None  Discharge Condition: Stable at the time he left the hospital. CODE STATUS: Full code   Follow-up Information    Schedule an appointment as soon as possible for a visit  with [facility].   Specialty: Cardiology Contact information: [address], [address] [place] [address] [phone]       Call  [facility].   Contact information: [address]     [address] [place] [address] [phone]       Marjie Skiff E, PA-C Follow up on 11/01/2020.   Specialties: Physician Assistant, Cardiology Why: at 1045am  Contact information: [address] [place] [address] [phone]               Hospital Course: 44 year old male with history of recent left MCA CVA, TTP s/p splenectomy, HTN, LVH and polysubstance use (cocaine, marijuana and tobacco) returning with chest pain.  Recently evaluated for this.  CTA chest was negative for dissection or other significant finding.  UDS was positive for cocaine.   He also had left-sided headache, blurry vision and expressive aphasia. CT head showed new hypoattenuation along the ventral body of the left corpus callosum eccentric to the left suspicious for acute subacute infarct. Neurology consulted, and MRI ordered. UDS positive for opiates and marijuana but negative for cocaine.  MRI revealed acute left ACA and patchy left MCA parieto-occipital infarct with late subacute left MCA infarct thought to be cardioembolic.  Neurology felt he  is not a good long-term anticoagulation candidate given history of noncompliance and polysubstance abuse, and recommended Plavix and low-dose aspirin for 3 weeks followed by Plavix alone.   Cardiology was consulted about his chest pain, and felt the pain to be nonanginal and signed off.  His EKG was not diagnostic and was consistent with LVH with repolarization.  Patient had a fall after he got out of the bed to go to the bathroom the night of 11/24.  CT head without contrast showed a small subarachnoid hemorrhage in the left frontal lobe.  Aspirin and Plavix held.  Subarachnoid hemorrhage stable on repeat CT the next day.  Therapy recommended CIR.   Patient has received IV Dilaudid multiple times by complaining severe headache, chest pain and abdominal pain, and threatening to leave AGAINST MEDICAL ADVICE multiple times.  Given his substance use history, vague description of his pain quality and distribution, and absence of objective finding to correlate with the degree of the pain he reports, IV pain medication discontinued and he was started on scheduled Tylenol with as needed oxycodone. However, he refused to take p.o. medications and left AGAINST MEDICAL ADVICE to go to another hospital where he can get IV pain medications.  Patient has capacity to make medical decision but making poor decision.  See individual problem list below for more on hospital course.   Discharge Diagnoses:  Acute left ACA, left MCA infarct and subacute left MCA infarct: concern about cardioembolic etiology but not a good candidate for anticoagulation due to polysubstance use and fall risk -Neurology signed off and recommended Plavix and aspirin for 3 weeks followed by  Plavix alone -Plavix and aspirin on hold in the setting of new subarachnoid hemorrhage after fall. -BP goal normotensive -Continue statin. -Therapy recommended CIR.  Accidental fall: Got out of the bed to go to bathroom on his own the night of  11/24.  He fell and hit his head.  CT head with new small subarachnoid hemorrhage in the left frontal lobe.  However, it is unclear if the bleed is from fall or DAPT prior to fall.  Subarachnoid hemorrhage stable on repeat CT. -Continue holding Plavix and aspirin  Atypical chest pain with abnormal EKG: Troponin negative.  Abnormal EKG fails to be due to LVH with repolarization.  TTE reassuring.  Has history of cocaine but UDS negative for now.  He treated to leave AMA unless he gets stronger pain medication but change his mind. -Cardiology signed off-no further cardiac work-up recommended. -Scheduled Tylenol with as needed tramadol  Left-sided headache-subarachnoid hemorrhage could contribute to the headache but he has been endorsing headache before this.  He frequently asks for IV pain medication and threatens to leave AMA if he does not get it but never did.  He has received IV Dilaudid here and there intermittently.  Difficult to know how much pain he is in.   Refused to try Fioricet. -Scheduled Tylenol 1 g 3 times daily -Oxycodone 5 mg for moderate pain and 10 mg for severe pain  Uncontrolled hypertension: BP within fair range for most part -Continue home amlodipine -Home metoprolol changed to carvedilol due to history of cocaine use -Continue home clonidine at reduced dose. -Continue home Lasix  CKD-3A: Relatively stable. -Continue monitoring intermittently  Polysubstance use including marijuana, cocaine and tobacco -Counseled. -Offer nicotine patch  Debility/physical deconditioning -Therapy recommended CIR.  Underweight Body mass index    Body mass index is 18.96 kg/m. Nutrition Problem: Inadequate oral intake Etiology: decreased appetite Signs/Symptoms: energy intake < 75% for > 7 days Interventions: Ensure Enlive (each supplement provides 350kcal and 20 grams of protein), MVI      Discharge Exam: Vitals:   10/15/20 1227 10/15/20 1513  BP: (!) 148/78 (!)  157/86  Pulse: 64 65  Resp: 16 18  Temp: (!) 97.3 F (36.3 C) 97.6 F (36.4 C)  SpO2: 100% 100%    GENERAL: No apparent distress.  Nontoxic. HEENT: MMM.  Vision and hearing grossly intact.  NECK: Supple.  No apparent JVD.  RESP:  No IWOB.  Fair aeration bilaterally. CVS:  RRR. Heart sounds normal.  ABD/GI/GU: BS+. Abd soft, NTND.  MSK/EXT:  Moves extremities. No apparent deformity. No edema.  SKIN: no apparent skin lesion or wound NEURO: Awake, alert and oriented appropriately.  No apparent focal neuro deficit other than expressive aphasia, diminished vision from both eyes and some bilateral lower extremity weakness PSYCH: Calm. Normal affect.  Patient was not examined by the time he left AGAINST MEDICAL ADVICE.  The above physical exam is based on his exam earlier in the morning.  Discharge Instructions  Discharge Instructions    Ambulatory referral to Occupational Therapy   Complete by: As directed    Stroke   Ambulatory referral to Physical Therapy   Complete by: As directed    Stroke   Ambulatory referral to Speech Therapy   Complete by: As directed    Call MD for:  difficulty breathing, headache or visual disturbances   Complete by: As directed    Call MD for:  extreme fatigue   Complete by: As directed    Call MD for:  severe uncontrolled pain   Complete by: As directed    Diet - low sodium heart healthy   Complete by: As directed    Discharge instructions   Complete by: As directed    It has been a pleasure taking care of you!  You were hospitalized and treated for chest pain and a stroke.  We strongly recommend you stop using marijuana, cocaine or any other recreational drugs as they could increase your risk of heart attack, stroke and other problems.  We have made some adjustment to your medications during this hospitalization.  Please review your new medication list and the directions on your medications before you take them.  Please follow-up with your  primary care doctor and cardiologist in 1 to 2 weeks.  Also follow-up with neurology in 4 to 6 weeks.   Take care,   Increase activity slowly   Complete by: As directed      Allergies as of 10/15/2020      Reactions   Morphine And Related Other (See Comments)   Severe abdominal pain   Fentanyl Other (See Comments)   Mania      Medication List    STOP taking these medications   hydrochlorothiazide 12.5 MG capsule Commonly known as: MICROZIDE   metoprolol tartrate 25 MG tablet Commonly known as: LOPRESSOR   oxyCODONE 5 MG immediate release tablet Commonly known as: Oxy IR/ROXICODONE   polyethylene glycol 17 g packet Commonly known as: MIRALAX / GLYCOLAX     TAKE these medications   acetaminophen 325 MG tablet Commonly known as: TYLENOL Take 650 mg by mouth every 6 (six) hours as needed for moderate pain or headache.   amLODipine 10 MG tablet Commonly known as: NORVASC Take 1 tablet (10 mg total) by mouth daily.   aspirin 81 MG EC tablet Take 1 tablet (81 mg total) by mouth daily for 21 days. Stop after three weeks What changed: additional instructions   atorvastatin 40 MG tablet Commonly known as: LIPITOR Take 1 tablet (40 mg total) by mouth daily.   carvedilol 6.25 MG tablet Commonly known as: COREG Take 0.5 tablets (3.125 mg total) by mouth 2 (two) times daily with a meal.   cloNIDine 0.2 MG tablet Commonly known as: CATAPRES Take 1 tablet (0.2 mg total) by mouth 2 (two) times daily.   clopidogrel 75 MG tablet Commonly known as: PLAVIX Take 1 tablet (75 mg total) by mouth daily. What changed: additional instructions   cyanocobalamin 1000 MCG tablet Take 1 tablet (1,000 mcg total) by mouth daily.   folic acid 1 MG tablet Commonly known as: FOLVITE Take 1 tablet (1 mg total) by mouth daily.   furosemide 40 MG tablet Commonly known as: LASIX Take 1 tablet (40 mg total) by mouth daily.   multivitamin with minerals Tabs tablet Take 1 tablet by  mouth daily.   pantoprazole 40 MG tablet Commonly known as: PROTONIX Take 1 tablet (40 mg total) by mouth daily.   senna-docusate 8.6-50 MG tablet Commonly known as: Senokot-S Take 2 tablets by mouth at bedtime as needed for mild constipation or moderate constipation.   sucralfate 1 g tablet Commonly known as: CARAFATE Take 1 tablet (1 g total) by mouth 4 (four) times daily -  with meals and at bedtime.       Consultations:  Neurology  Procedures/Studies:   DG Chest 2 View  Result Date: 10/10/2020 CLINICAL DATA:  Chest pain and headache 4 days. EXAM: CHEST - 2 VIEW COMPARISON:  09/25/2020 and CT 09/23/2020 FINDINGS: Lungs are adequately inflated without focal airspace consolidation or effusion. Calcified granuloma over the right middle lobe. Cardiomediastinal silhouette and remainder of the exam is unchanged. IMPRESSION: No active cardiopulmonary disease. Electronically Signed   By: Elberta Fortis M.D.   On: 10/10/2020 14:30   CT HEAD WO CONTRAST  Result Date: 10/14/2020 CLINICAL DATA:  Follow-up subarachnoid hemorrhage EXAM: CT HEAD WITHOUT CONTRAST TECHNIQUE: Contiguous axial images were obtained from the base of the skull through the vertex without intravenous contrast. COMPARISON:  10/13/2020, 10/10/2020 FINDINGS: Brain: Small amount of subarachnoid hemorrhage is again seen within the left frontal lobe, which appears unchanged from the previous CT. No new sites of intraparenchymal or extra-axial hemorrhage. Evolving left parietooccipital lobe infarct without evidence of hemorrhagic transformation. Small area of low attenuation along the ventral body of the left corpus callosum, unchanged. Prior right thalamic lacunar infarct. No evidence of a new acute infarct. No hydrocephalus. Vascular: Atherosclerotic calcifications involving the large vessels of the skull base. No unexpected hyperdense vessel. Skull: Negative for acute calvarial fracture or focal lesion. Bilateral nasal bone  fractures are unchanged. Sinuses/Orbits: No acute finding. Other: None. IMPRESSION: 1. No significant interval change in small amount of subarachnoid hemorrhage within the left frontal lobe. 2. Evolving left parietooccipital lobe infarct without evidence of hemorrhagic transformation. 3. Unchanged small area of low attenuation along the ventral body of the left corpus callosum. 4. Bilateral nasal bone fractures are unchanged. Electronically Signed   By: Duanne Guess D.O.   On: 10/14/2020 12:21   CT HEAD WO CONTRAST  Result Date: 10/13/2020 CLINICAL DATA:  Status post fall. EXAM: CT HEAD WITHOUT CONTRAST TECHNIQUE: Contiguous axial images were obtained from the base of the skull through the vertex without intravenous contrast. COMPARISON:  MRI head and plain brain CT, dated October 10, 2020. FINDINGS: Brain: There is mild cerebral atrophy with widening of the extra-axial spaces and ventricular dilatation. There are areas of decreased attenuation within the white matter tracts of the supratentorial brain, consistent with microvascular disease changes. A small amount of acute subarachnoid blood is seen within the left frontal lobe. This represents a new finding when compared to the prior study. There is no evidence of associated mass effect or midline shift. Areas of cortical and white matter low attenuation are again seen involving the parietooccipital region on the left. A small chronic right para thalamic lacunar infarct is seen. A small area of low attenuation is seen along the ventral body of the corpus callosum on the left. This is seen on the prior plain brain CT and prior MR head and is unchanged in appearance. Vascular: No hyperdense vessel or unexpected calcification. Skull: Normal. Negative for fracture or focal lesion. Sinuses/Orbits: No acute finding. Other: None. IMPRESSION: 1. Small acute left frontal lobe subarachnoid hemorrhage. This represents a new finding when compared to the prior study.  2. Stable left parieto-occipital ischemic infarct. 3. Small, stable acute ischemic nonhemorrhagic left ACA territory infarct. Electronically Signed   By: Aram Candela M.D.   On: 10/13/2020 22:58   CT Head Wo Contrast  Result Date: 10/10/2020 CLINICAL DATA:  Headache, recent stroke EXAM: CT HEAD WITHOUT CONTRAST TECHNIQUE: Contiguous axial images were obtained from the base of the skull through the vertex without intravenous contrast. COMPARISON:  09/25/2020 FINDINGS: Brain: New hypoattenuation along the ventral body of the corpus callosum eccentric to the left. Evolving recent infarcts of the posterior left MCA territory and right corona radiata. Chronic infarcts of the left  corona radiata and right thalamus. No acute intracranial hemorrhage or significant mass effect. No hydrocephalus or extra-axial collection. Vascular: No hyperdense vessel. There is atherosclerotic calcification at the skull base. Skull: Calvarium is unremarkable. Sinuses/Orbits: No acute finding. Other: None. IMPRESSION: New hypoattenuation along the ventral body of the corpus callosum eccentric to the left suspicious for acute or subacute infarction. Evolving recent infarcts of the posterior left MCA territory and right corona radiata. No acute intracranial hemorrhage. These results were called by telephone at the time of interpretation on 10/10/2020 at 4:47 pm to provider Trudee Grip, who verbally acknowledged these results. Electronically Signed   By: Guadlupe Spanish M.D.   On: 10/10/2020 16:47   CT HEAD WO CONTRAST  Result Date: 09/25/2020 CLINICAL DATA:  Stroke.  Worsening headache. EXAM: CT HEAD WITHOUT CONTRAST TECHNIQUE: Contiguous axial images were obtained from the base of the skull through the vertex without intravenous contrast. COMPARISON:  CT head 09/24/2020 FINDINGS: Brain: Hypodensity in the left temporoparietal lobe unchanged. No acute hemorrhage identified. Subtle hypodensity right corona radiata may represent a  recent infarct. Small hypodensity in the left posterior insula and external capsule unchanged consistent with chronic infarct. Chronic infarct right thalamus unchanged. Ventricle size normal without midline shift. No acute hemorrhage. Vascular: Negative for hyperdense vessel Skull: Negative Sinuses/Orbits: Mild mucosal edema left maxillary sinus. Negative orbit Other: None IMPRESSION: Acute infarct left temporoparietal lobe unchanged. No associated hemorrhage. No change from the prior CT. Electronically Signed   By: Marlan Palau M.D.   On: 09/25/2020 17:08   CT HEAD WO CONTRAST  Result Date: 09/24/2020 CLINICAL DATA:  Severe headache.  History of stroke. EXAM: CT HEAD WITHOUT CONTRAST TECHNIQUE: Contiguous axial images were obtained from the base of the skull through the vertex without intravenous contrast. COMPARISON:  Yesterday FINDINGS: Brain: More well-defined cytotoxic edema in the left frontal parietal convexity, expected. Subacute infarct at the right corona radiata is unchanged. Chronic lacunar infarct at the right thalamus and left corona radiata. No acute hemorrhage, hydrocephalus, or significant mass effect. Vascular: No hyperdense vessel or unexpected calcification. Skull: Remote nasal bone fractures. Sinuses/Orbits: No acute finding. IMPRESSION: 1. No new abnormality. 2. Acute left MCA branch infarct. 3. Subacute right corona radiata infarct. 4. Chronic lacunar infarcts. Electronically Signed   By: Marnee Spring M.D.   On: 09/24/2020 06:07   CT HEAD WO CONTRAST  Result Date: 09/23/2020 CLINICAL DATA:  Neuro deficit, acute stroke suspected. EXAM: CT HEAD WITHOUT CONTRAST TECHNIQUE: Contiguous axial images were obtained from the base of the skull through the vertex without intravenous contrast. COMPARISON:  Noncontrast head CT 09/20/2020. CT angiogram head/neck 09/20/2020. Noncontrast head CT 09/21/2020. Brain MRI 09/22/2020. FINDINGS: Brain: Cerebral volume is normal. A moderate to large  acute/early subacute cortical and subcortical infarct within the left parietal lobe, posterosuperior left temporal lobe and posterior left insula has not significantly changed in extent as compared to the brain MRI performed one day prior. Subtle petechial hemorrhage is questioned, but there is no frank hemorrhagic conversion. Regional mass effect. No midline shift A small subacute infarct within the right corona radiata was better appreciated on the prior MRI. Redemonstrated chronic lacunar infarcts within the bilateral cerebral white matter, left basal ganglia and right thalamus. No extra-axial fluid collection. No evidence of intracranial mass. No midline shift. Vascular: Atherosclerotic calcifications. Skull: Normal. Negative for fracture or focal lesion. Sinuses/Orbits: Visualized orbits show no acute finding. Mild paranasal sinus mucosal thickening, most notably ethmoidal. Other: There are acute, comminuted and displaced bilateral  nasal bone fractures, new as compared to prior examinations. Partial opacification of the right nasal passage. Suspected additional minimally displaced acute fractures of the bony nasal septum (for instance as seen on series 4, images 16 and 17). IMPRESSION: A moderate to large acute/early subacute cortical and subcortical infarct within the left MCA vascular territory has not significantly changed in extent as compared to the brain MRI performed one day prior. There is regional mass effect but no midline shift. Subtle petechial hemorrhage is questioned, but there is no frank hemorrhagic conversion. A small subacute infarct within the right corona radiata was better appreciated on the prior MRI. Redemonstrated chronic lacunar infarcts within the bilateral cerebral white matter, left basal ganglia and right thalamus. Acute, comminuted and displaced bilateral nasal bone fractures which are new as compared to the prior examinations. Suspected minimally displaced acute fractures of the  bony nasal septum. Partial opacification of the right nasal passage. Consider dedicated maxillofacial CT for further evaluation. Electronically Signed   By: Jackey Loge DO   On: 09/23/2020 19:23   MR ANGIO HEAD WO CONTRAST  Result Date: 10/10/2020 CLINICAL DATA:  Initial evaluation for neuro deficit, stroke suspected. EXAM: MRI HEAD WITHOUT CONTRAST MRA HEAD WITHOUT CONTRAST MRA NECK WITHOUT CONTRAST TECHNIQUE: Multiplanar, multiecho pulse sequences of the brain and surrounding structures were obtained without intravenous contrast. Angiographic images of the Circle of Willis were obtained using MRA technique without intravenous contrast. Angiographic images of the neck were obtained using MRA technique without intravenous contrast. Carotid stenosis measurements (when applicable) are obtained utilizing NASCET criteria, using the distal internal carotid diameter as the denominator. COMPARISON:  Prior CT from earlier same day as well as earlier studies. FINDINGS: MRI HEAD FINDINGS Brain: Cerebral volume within normal limits for age. Multiple scattered remote lacunar infarcts noted about the left basal ganglia/corona radiata and right thalamus. Perforator type lacunar infarct involving the posterior right basal ganglia/corona radiata demonstrates mild residual diffusion abnormality without ADC correlate, consistent with a late subacute ischemic infarct (series 5, image 83). No associated hemorrhage or mass effect. Confluent area of restricted diffusion involving the body of the left corpus callosum measures 4.3 x 1.7 cm, consistent with an acute left ACA territory infarct (series 5, image 83). Mild diffusion abnormality/ischemia noted within the contralateral right corpus callosum as well (series 6, image 34). No associated hemorrhage or mass effect. Patchy and confluent restricted diffusion involving the left parieto-occipital region is seen (series 5, image 84). Associated signal loss seen on ADC map (series 6,  image 34), consistent with an acute left MCA distribution infarct. 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 Cortically based T1 hyperintensity and susceptibility artifact consistent with laminar necrosis and/or petechial hemorrhage. No significant regional mass effect. Additional punctate acute ischemic nonhemorrhagic infarct noted anteriorly within the left frontal centrum semi ovale (series 5, image 90). No other evidence for acute or subacute ischemia. Gray-white matter differentiation otherwise maintained. No mass lesion or midline shift. No hydrocephalus. Subtle 2 mm FLAIR hyperintensity with associated susceptibility artifacts seen overlying the left frontal convexity, consistent with a small subdural hematoma, likely subacute in nature (series 9, image 16). No associated mass effect. Additional faint component noted posteriorly at the left parietal convexity. Pituitary gland suprasellar region normal. Vascular: Major intracranial vascular flow voids are maintained. Skull and upper cervical spine: Craniocervical junction normal. Bone marrow signal intensity within normal limits. No visible scalp soft tissue injury. Sinuses/Orbits: Globes and orbital soft tissues grossly within normal  limits. Scattered mucosal thickening noted within the ethmoidal air cells and maxillary sinuses. Paranasal sinuses are otherwise clear. No significant mastoid effusion. Inner ear structures normal. Other: None. MRA HEAD FINDINGS ANTERIOR CIRCULATION: Examination mildly degraded by motion artifact. Visualized distal cervical segments of the internal carotid arteries are patent with antegrade flow. Petrous segments patent bilaterally. Atheromatous irregularity within the cavernous/supraclinoid segments without high-grade stenosis. A1 segments patent bilaterally. Normal anterior communicating artery complex. There are severe near occlusive stenoses involving the proximal A2  segments bilaterally (series 18, image 114 on the right, series 18, image 117 on the left). Additional severe downstream 8 3 stenosis (series 18, image 152). ACAs remain perfused to their distal aspects at this time. No M1 stenosis or occlusion. Negative MCA bifurcations. Distal right MCA branches remain perfused, although demonstrate advanced small vessel atheromatous irregularity for age. On the left, there is focal signal loss within a proximal left M2 branch, inferior division (series 18, image 109). Finding could reflect severe high-grade stenosis or possibly subocclusive thrombus. Left MCA branches otherwise perfused, although demonstrate advanced age-related small vessel atheromatous irregularity. POSTERIOR CIRCULATION: Visualized vertebral arteries patent to the vertebrobasilar junction without stenosis. Left PICA patent proximally. Basilar patent to its distal aspect without stenosis. Superior cerebral arteries patent bilaterally. Predominant fetal type origin of the PCAs bilaterally. There is a severe near occlusive stenosis involving the mid left P2 segment (series 26, image 8). PCAs otherwise patent without proximal high-grade stenosis, although demonstrate extensive distal small vessel atheromatous irregularity. MRA NECK FINDINGS AORTIC ARCH: Examination somewhat limited by lack of IV contrast. Visualized aortic arch of normal caliber with normal branch pattern. No hemodynamically significant stenosis seen about the origin of the great vessels. RIGHT CAROTID SYSTEM: Right CCA patent from its origin to the bifurcation without stenosis. Mild atheromatous narrowing of no more than 20% at the origin of the right ICA at the bifurcation. Right ICA patent distally to the skull base without stenosis, evidence for dissection, or occlusion. LEFT CAROTID SYSTEM: Left CCA patent from its origin to the bifurcation without stenosis. Atheromatous narrowing of up to approximately 40% at the origin of the left ICA  (series 38, image 5). Left ICA otherwise patent to the skull base without stenosis, evidence for dissection or occlusion. VERTEBRAL ARTERIES: Both vertebral arteries arise from the subclavian arteries. Vertebral arteries diminutive, although the right is slightly dominant. Vertebral arteries patent within the neck without appreciable stenosis, evidence for dissection or occlusion. IMPRESSION: MRI HEAD IMPRESSION: 1. 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 Additional patchy acute ischemic infarcts involving the left parieto-occipital region, left MCA distribution. This is superimposed on an underlying late subacute left MCA infarct at this location. Evidence for associated laminar necrosis and/or petechial hemorrhage. 3. 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 4. Underlying multiple remote lacunar infarcts involving the left basal ganglia/corona radiata and right thalamus. 5. Tiny 2 mm subdural hematoma overlying the left frontal convexity as above, likely subacute in nature. No associated mass effect. MRA HEAD IMPRESSION: 1. Focal signal loss within a proximal left M2 branch, inferior division. Finding could reflect severe high-grade stenosis or possibly subocclusive thrombus. 2. 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 3. Severe mid left P2 stenosis. There is predominant fetal type origin of the PCAs bilaterally. 4. Extensive small vessel atheromatous irregularity throughout the intracranial circulation, advanced for age. MRA NECK IMPRESSION:  1. Mild atheromatous narrowing about the origins of both ICAs, measuring up to 40% on the left and 20% on the right. 2. Diminutive but patent vertebral arteries within the neck. Right vertebral artery slightly dominant. Case discussed by telephone at the time of interpretation on  10/10/2020 at 9:14 pm to provider [facility], who verbally acknowledged these results. Electronically Signed   By: Rise Mu M.D.   On: 10/10/2020 21:16   MR ANGIO NECK WO CONTRAST  Result Date: 10/10/2020 CLINICAL DATA:  Initial evaluation for neuro deficit, stroke suspected. EXAM: MRI HEAD WITHOUT CONTRAST MRA HEAD WITHOUT CONTRAST MRA NECK WITHOUT CONTRAST TECHNIQUE: Multiplanar, multiecho pulse sequences of the brain and surrounding structures were obtained without intravenous contrast. Angiographic images of the Circle of Willis were obtained using MRA technique without intravenous contrast. Angiographic images of the neck were obtained using MRA technique without intravenous contrast. Carotid stenosis measurements (when applicable) are obtained utilizing NASCET criteria, using the distal internal carotid diameter as the denominator. COMPARISON:  Prior CT from earlier same day as well as earlier studies. FINDINGS: MRI HEAD FINDINGS Brain: Cerebral volume within normal limits for age. Multiple scattered remote lacunar infarcts noted about the left basal ganglia/corona radiata and right thalamus. Perforator type lacunar infarct involving the posterior right basal ganglia/corona radiata demonstrates mild residual diffusion abnormality without ADC correlate, consistent with a late subacute ischemic infarct (series 5, image 83). No associated hemorrhage or mass effect. Confluent area of restricted diffusion involving the body of the left corpus callosum measures 4.3 x 1.7 cm, consistent with an acute left ACA territory infarct (series 5, image 83). Mild diffusion abnormality/ischemia noted within the contralateral right corpus callosum as well (series 6, image 34). No associated hemorrhage or mass effect. Patchy and confluent restricted diffusion involving the left parieto-occipital region is seen (series 5, image 84). Associated signal loss seen on ADC map (series 6, image 34), consistent with an  acute left MCA distribution infarct. 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 Cortically based T1 hyperintensity and susceptibility artifact consistent with laminar necrosis and/or petechial hemorrhage. No significant regional mass effect. Additional punctate acute ischemic nonhemorrhagic infarct noted anteriorly within the left frontal centrum semi ovale (series 5, image 90). No other evidence for acute or subacute ischemia. Gray-white matter differentiation otherwise maintained. No mass lesion or midline shift. No hydrocephalus. Subtle 2 mm FLAIR hyperintensity with associated susceptibility artifacts seen overlying the left frontal convexity, consistent with a small subdural hematoma, likely subacute in nature (series 9, image 16). No associated mass effect. Additional faint component noted posteriorly at the left parietal convexity. Pituitary gland suprasellar region normal. Vascular: Major intracranial vascular flow voids are maintained. Skull and upper cervical spine: Craniocervical junction normal. Bone marrow signal intensity within normal limits. No visible scalp soft tissue injury. Sinuses/Orbits: Globes and orbital soft tissues grossly within normal limits. Scattered mucosal thickening noted within the ethmoidal air cells and maxillary sinuses. Paranasal sinuses are otherwise clear. No significant mastoid effusion. Inner ear structures normal. Other: None. MRA HEAD FINDINGS ANTERIOR CIRCULATION: Examination mildly degraded by motion artifact. Visualized distal cervical segments of the internal carotid arteries are patent with antegrade flow. Petrous segments patent bilaterally. Atheromatous irregularity within the cavernous/supraclinoid segments without high-grade stenosis. A1 segments patent bilaterally. Normal anterior communicating artery complex. There are severe near occlusive stenoses involving the proximal A2 segments bilaterally (series 18,  image 114 on the right, series 18, image 117 on the left). Additional severe downstream 8  3 stenosis (series 18, image 152). ACAs remain perfused to their distal aspects at this time. No M1 stenosis or occlusion. Negative MCA bifurcations. Distal right MCA branches remain perfused, although demonstrate advanced small vessel atheromatous irregularity for age. On the left, there is focal signal loss within a proximal left M2 branch, inferior division (series 18, image 109). Finding could reflect severe high-grade stenosis or possibly subocclusive thrombus. Left MCA branches otherwise perfused, although demonstrate advanced age-related small vessel atheromatous irregularity. POSTERIOR CIRCULATION: Visualized vertebral arteries patent to the vertebrobasilar junction without stenosis. Left PICA patent proximally. Basilar patent to its distal aspect without stenosis. Superior cerebral arteries patent bilaterally. Predominant fetal type origin of the PCAs bilaterally. There is a severe near occlusive stenosis involving the mid left P2 segment (series 26, image 8). PCAs otherwise patent without proximal high-grade stenosis, although demonstrate extensive distal small vessel atheromatous irregularity. MRA NECK FINDINGS AORTIC ARCH: Examination somewhat limited by lack of IV contrast. Visualized aortic arch of normal caliber with normal branch pattern. No hemodynamically significant stenosis seen about the origin of the great vessels. RIGHT CAROTID SYSTEM: Right CCA patent from its origin to the bifurcation without stenosis. Mild atheromatous narrowing of no more than 20% at the origin of the right ICA at the bifurcation. Right ICA patent distally to the skull base without stenosis, evidence for dissection, or occlusion. LEFT CAROTID SYSTEM: Left CCA patent from its origin to the bifurcation without stenosis. Atheromatous narrowing of up to approximately 40% at the origin of the left ICA (series 38, image 5). Left ICA  otherwise patent to the skull base without stenosis, evidence for dissection or occlusion. VERTEBRAL ARTERIES: Both vertebral arteries arise from the subclavian arteries. Vertebral arteries diminutive, although the right is slightly dominant. Vertebral arteries patent within the neck without appreciable stenosis, evidence for dissection or occlusion. IMPRESSION: MRI HEAD IMPRESSION: 1. 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 Additional patchy acute ischemic infarcts involving the left parieto-occipital region, left MCA distribution. This is superimposed on an underlying late subacute left MCA infarct at this location. Evidence for associated laminar necrosis and/or petechial hemorrhage. 3. 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 4. Underlying multiple remote lacunar infarcts involving the left basal ganglia/corona radiata and right thalamus. 5. Tiny 2 mm subdural hematoma overlying the left frontal convexity as above, likely subacute in nature. No associated mass effect. MRA HEAD IMPRESSION: 1. Focal signal loss within a proximal left M2 branch, inferior division. Finding could reflect severe high-grade stenosis or possibly subocclusive thrombus. 2. 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 3. Severe mid left P2 stenosis. There is predominant fetal type origin of the PCAs bilaterally. 4. Extensive small vessel atheromatous irregularity throughout the intracranial circulation, advanced for age. MRA NECK IMPRESSION: 1. Mild atheromatous narrowing about the origins of both ICAs, measuring up to 40% on the left and 20% on the right. 2. Diminutive but patent vertebral arteries within the neck. Right vertebral artery slightly dominant. Case discussed by telephone at the time of interpretation on 10/10/2020 at 9:14 pm to provider  [facility], who verbally acknowledged these results. Electronically Signed   By: Rise Mu M.D.   On: 10/10/2020 21:16   MR BRAIN WO CONTRAST  Result Date: 10/10/2020 CLINICAL DATA:  Initial evaluation for neuro deficit, stroke suspected. EXAM: MRI HEAD WITHOUT CONTRAST MRA HEAD WITHOUT CONTRAST MRA NECK WITHOUT  CONTRAST TECHNIQUE: Multiplanar, multiecho pulse sequences of the brain and surrounding structures were obtained without intravenous contrast. Angiographic images of the Circle of Willis were obtained using MRA technique without intravenous contrast. Angiographic images of the neck were obtained using MRA technique without intravenous contrast. Carotid stenosis measurements (when applicable) are obtained utilizing NASCET criteria, using the distal internal carotid diameter as the denominator. COMPARISON:  Prior CT from earlier same day as well as earlier studies. FINDINGS: MRI HEAD FINDINGS Brain: Cerebral volume within normal limits for age. Multiple scattered remote lacunar infarcts noted about the left basal ganglia/corona radiata and right thalamus. Perforator type lacunar infarct involving the posterior right basal ganglia/corona radiata demonstrates mild residual diffusion abnormality without ADC correlate, consistent with a late subacute ischemic infarct (series 5, image 83). No associated hemorrhage or mass effect. Confluent area of restricted diffusion involving the body of the left corpus callosum measures 4.3 x 1.7 cm, consistent with an acute left ACA territory infarct (series 5, image 83). Mild diffusion abnormality/ischemia noted within the contralateral right corpus callosum as well (series 6, image 34). No associated hemorrhage or mass effect. Patchy and confluent restricted diffusion involving the left parieto-occipital region is seen (series 5, image 84). Associated signal loss seen on ADC map (series 6, image 34), consistent with an acute left MCA distribution infarct.  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 Cortically based T1 hyperintensity and susceptibility artifact consistent with laminar necrosis and/or petechial hemorrhage. No significant regional mass effect. Additional punctate acute ischemic nonhemorrhagic infarct noted anteriorly within the left frontal centrum semi ovale (series 5, image 90). No other evidence for acute or subacute ischemia. Gray-white matter differentiation otherwise maintained. No mass lesion or midline shift. No hydrocephalus. Subtle 2 mm FLAIR hyperintensity with associated susceptibility artifacts seen overlying the left frontal convexity, consistent with a small subdural hematoma, likely subacute in nature (series 9, image 16). No associated mass effect. Additional faint component noted posteriorly at the left parietal convexity. Pituitary gland suprasellar region normal. Vascular: Major intracranial vascular flow voids are maintained. Skull and upper cervical spine: Craniocervical junction normal. Bone marrow signal intensity within normal limits. No visible scalp soft tissue injury. Sinuses/Orbits: Globes and orbital soft tissues grossly within normal limits. Scattered mucosal thickening noted within the ethmoidal air cells and maxillary sinuses. Paranasal sinuses are otherwise clear. No significant mastoid effusion. Inner ear structures normal. Other: None. MRA HEAD FINDINGS ANTERIOR CIRCULATION: Examination mildly degraded by motion artifact. Visualized distal cervical segments of the internal carotid arteries are patent with antegrade flow. Petrous segments patent bilaterally. Atheromatous irregularity within the cavernous/supraclinoid segments without high-grade stenosis. A1 segments patent bilaterally. Normal anterior communicating artery complex. There are severe near occlusive stenoses involving the proximal A2 segments bilaterally (series 18, image 114 on the right, series 18,  image 117 on the left). Additional severe downstream 8 3 stenosis (series 18, image 152). ACAs remain perfused to their distal aspects at this time. No M1 stenosis or occlusion. Negative MCA bifurcations. Distal right MCA branches remain perfused, although demonstrate advanced small vessel atheromatous irregularity for age. On the left, there is focal signal loss within a proximal left M2 branch, inferior division (series 18, image 109). Finding could reflect severe high-grade stenosis or possibly subocclusive thrombus. Left MCA branches otherwise perfused, although demonstrate advanced age-related small vessel atheromatous irregularity. POSTERIOR CIRCULATION: Visualized vertebral arteries patent to the vertebrobasilar junction without stenosis. Left PICA patent proximally. Basilar patent to its distal aspect without stenosis. Superior  cerebral arteries patent bilaterally. Predominant fetal type origin of the PCAs bilaterally. There is a severe near occlusive stenosis involving the mid left P2 segment (series 26, image 8). PCAs otherwise patent without proximal high-grade stenosis, although demonstrate extensive distal small vessel atheromatous irregularity. MRA NECK FINDINGS AORTIC ARCH: Examination somewhat limited by lack of IV contrast. Visualized aortic arch of normal caliber with normal branch pattern. No hemodynamically significant stenosis seen about the origin of the great vessels. RIGHT CAROTID SYSTEM: Right CCA patent from its origin to the bifurcation without stenosis. Mild atheromatous narrowing of no more than 20% at the origin of the right ICA at the bifurcation. Right ICA patent distally to the skull base without stenosis, evidence for dissection, or occlusion. LEFT CAROTID SYSTEM: Left CCA patent from its origin to the bifurcation without stenosis. Atheromatous narrowing of up to approximately 40% at the origin of the left ICA (series 38, image 5). Left ICA otherwise patent to the skull base without  stenosis, evidence for dissection or occlusion. VERTEBRAL ARTERIES: Both vertebral arteries arise from the subclavian arteries. Vertebral arteries diminutive, although the right is slightly dominant. Vertebral arteries patent within the neck without appreciable stenosis, evidence for dissection or occlusion. IMPRESSION: MRI HEAD IMPRESSION: 1. 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 Additional patchy acute ischemic infarcts involving the left parieto-occipital region, left MCA distribution. This is superimposed on an underlying late subacute left MCA infarct at this location. Evidence for associated laminar necrosis and/or petechial hemorrhage. 3. 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 4. Underlying multiple remote lacunar infarcts involving the left basal ganglia/corona radiata and right thalamus. 5. Tiny 2 mm subdural hematoma overlying the left frontal convexity as above, likely subacute in nature. No associated mass effect. MRA HEAD IMPRESSION: 1. Focal signal loss within a proximal left M2 branch, inferior division. Finding could reflect severe high-grade stenosis or possibly subocclusive thrombus. 2. 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 3. Severe mid left P2 stenosis. There is predominant fetal type origin of the PCAs bilaterally. 4. Extensive small vessel atheromatous irregularity throughout the intracranial circulation, advanced for age. MRA NECK IMPRESSION: 1. Mild atheromatous narrowing about the origins of both ICAs, measuring up to 40% on the left and 20% on the right. 2. Diminutive but patent vertebral arteries within the neck. Right vertebral artery slightly dominant. Case discussed by telephone at the time of interpretation on 10/10/2020 at 9:14 pm to provider [facility], who verbally acknowledged  these results. Electronically Signed   By: Rise Mu M.D.   On: 10/10/2020 21:16   DG Chest Port 1 View  Result Date: 09/25/2020 CLINICAL DATA:  Dyspnea EXAM: PORTABLE CHEST 1 VIEW COMPARISON:  09/23/2020 chest radiograph. FINDINGS: Stable cardiomediastinal silhouette with normal heart size. No pneumothorax. No pleural effusion. Lungs appear clear, with no acute consolidative airspace disease and no pulmonary edema. IMPRESSION: No active disease. Electronically Signed   By: Delbert Phenix M.D.   On: 09/25/2020 07:59   DG Chest Portable 1 View  Result Date: 09/23/2020 CLINICAL DATA:  Chest pain EXAM: PORTABLE CHEST 1 VIEW COMPARISON:  September 21, 2020 FINDINGS: The heart size and mediastinal contours are within normal limits. Both lungs are clear. The visualized skeletal structures are unremarkable. IMPRESSION: No active disease. Electronically Signed   By: Jonna Clark M.D.   On: 09/23/2020 16:49   VAS [place] TRANSCRANIAL  DOPPLER W BUBBLES  Result Date: 09/27/2020  Transcranial Doppler with Bubble Indications: Stroke. Comparison Study: No prior study Performing Technologist: Gertie FeyMichelle Simonetti MHA, RDMS, RVT, RDCS  Examination Guidelines: A complete evaluation includes B-mode imaging, spectral Doppler, color Doppler, and power Doppler as needed of all accessible portions of each vessel. Bilateral testing is considered an integral part of a complete examination. Limited examinations for reoccurring indications may be performed as noted.  Summary: No HITS at rest or during Valsalva. Negative transcranial Doppler Bubble study with no evidence of right to left intracardiac communication.  A vascular evaluation was performed. The right middle cerebral artery was studied. An IV was inserted into the patient's right forearm. Verbal informed consent was obtained.  NEGATIVE TCD Buuble study *See table(s) above for TCD measurements and observations.  Diagnosing physician: Delia HeadyPramod Sethi MD Electronically signed  by Delia HeadyPramod Sethi MD on 09/27/2020 at 1:58:44 PM.    Final    ECHOCARDIOGRAM COMPLETE  Result Date: 09/24/2020    ECHOCARDIOGRAM REPORT   Patient Name:   Alejandro Perez Date of Exam: 09/24/2020 Medical Rec #:  [id number]    Height:       74.0 in Accession #:    [id number]   Weight:       159.3 lb Date of Birth:  [birth year]   BSA:          1.973 m Patient Age:    44 years     BP:           155/87 mmHg Patient Gender: M            HR:           53 bpm. Exam Location:  Inpatient Procedure: 2D Echo, Color Doppler, Cardiac Doppler and Strain Analysis Indications:    Stroke i163.9  History:        Patient has prior history of Echocardiogram examinations, most                 recent 12/21/2017. Risk Factors:Hypertension.  Sonographer:    Irving BurtonEmily Senior RDCS Referring Phys: [id number] Scheryl MartenJINDONG XU IMPRESSIONS  1. Left ventricular ejection fraction, by estimation, is 55 to 60%. The left ventricle has normal function. The left ventricle has no regional wall motion abnormalities. There is severe left ventricular hypertrophy. Left ventricular diastolic parameters  are indeterminate.  2. Right ventricular systolic function is normal. The right ventricular size is normal.  3. Left atrial size was mild to moderately dilated.  4. Mild mitral valve regurgitation.  5. 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 The aortic valve is abnormal. Aortic valve regurgitation is mild to moderate. Mild aortic valve sclerosis is present, with no evidence of aortic valve stenosis.  6. The inferior vena cava is normal in size with greater than 50% respiratory variability, suggesting right atrial pressure of 3 mmHg. FINDINGS  Left Ventricle: Left ventricular ejection fraction, by estimation, is 55 to 60%. The left ventricle has normal function. The left ventricle has no regional wall motion abnormalities. The left ventricular internal cavity size was normal in size. There is  severe left ventricular hypertrophy. Left ventricular diastolic  parameters are indeterminate. Right Ventricle: The right ventricular size is normal. Right vetricular wall thickness was not assessed. Right ventricular systolic function is normal. Left Atrium: Left atrial size was mild to moderately dilated. Right Atrium: Right atrial size was normal in size. Pericardium: There is no evidence of pericardial effusion. Mitral Valve: The mitral valve is abnormal. There is mild thickening of  the mitral valve leaflet(s). Mild mitral annular calcification. Mild mitral valve regurgitation. Tricuspid Valve: The tricuspid valve is normal in structure. Tricuspid valve regurgitation is trivial. 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 The aortic valve is abnormal. Aortic valve regurgitation is mild to moderate. Aortic regurgitation PHT measures 897 msec. Mild aortic valve sclerosis is present, with no evidence of aortic valve stenosis. Pulmonic Valve: The pulmonic valve was grossly normal. Pulmonic valve regurgitation is not visualized. Aorta: The aortic root and ascending aorta are structurally normal, with no evidence of dilitation. Venous: The inferior vena cava is normal in size with greater than 50% respiratory variability, suggesting right atrial pressure of 3 mmHg. IAS/Shunts: No atrial level shunt detected by color flow Doppler.  LEFT VENTRICLE PLAX 2D LVIDd:         4.30 cm  Diastology LVIDs:         2.90 cm  LV e' medial:    5.22 cm/s LV PW:         1.50 cm  LV E/e' medial:  11.9 LV IVS:        1.80 cm  LV e' lateral:   5.11 cm/s LVOT diam:     2.10 cm  LV E/e' lateral: 12.2 LV SV:         87 LV SV Index:   44 LVOT Area:     3.46 cm  RIGHT VENTRICLE RV S prime:     11.00 cm/s TAPSE (M-mode): 2.3 cm LEFT ATRIUM              Index       RIGHT ATRIUM           Index LA diam:        4.50 cm  2.28 cm/m  RA Area:     14.10 cm LA Vol (A2C):   112.0 ml 56.76 ml/m RA Volume:   29.70 ml  15.05 ml/m LA Vol (A4C):   76.1 ml  38.57 ml/m LA Biplane Vol:  92.1 ml  46.68 ml/m  AORTIC VALVE LVOT Vmax:   111.00 cm/s LVOT Vmean:  71.700 cm/s LVOT VTI:    0.252 m AI PHT:      897 msec  AORTA Ao Root diam: 3.70 cm Ao Asc diam:  3.50 cm MITRAL VALVE MV Area (PHT): 2.62 cm    SHUNTS MV Decel Time: 290 msec    Systemic VTI:  0.25 m MV E velocity: 62.20 cm/s  Systemic Diam: 2.10 cm MV A velocity: 48.00 cm/s MV E/A ratio:  1.30 Dietrich Pates MD Electronically signed by Dietrich Pates MD Signature Date/Time: 09/24/2020/3:44:05 PM    Final    ECHO TEE  Result Date: 09/28/2020    TRANSESOPHOGEAL ECHO REPORT   Patient Name:   Alejandro Perez Date of Exam: 09/28/2020 Medical Rec #:  [id number]    Height:       74.0 in Accession #:    [id number]   Weight:       156.5 lb Date of Birth:  03-07-1976   BSA:          1.958 m Patient Age:    44 years     BP:           159/80 mmHg Patient Gender: M            HR:           69 bpm. Exam Location:  Inpatient Procedure: Transesophageal Echo Indications:     Stroke  History:         Patient has prior history of Echocardiogram examinations, most                  recent 09/24/2020. Pulmonary HTN; Risk Factors:Hypertension.                  CKD. HFpEF.  Sonographer:     Ross Ludwig RDCS (AE) Referring Phys:  [id number] Corrin Parker Diagnosing Phys: Armanda Magic MD PROCEDURE: After discussion of the risks and benefits of a TEE, an informed consent was obtained from the patient. The transesophogeal probe was passed without difficulty through the esophogus of the patient. Local oropharyngeal anesthetic was provided with Cetacaine. Sedation performed by different physician. The patient was monitored while under deep sedation. Anesthestetic sedation was provided intravenously by Anesthesiology: 344.65mg  of Propofol. Image quality was adequate. The patient's vital signs; including heart rate, blood pressure, and oxygen saturation; remained stable throughout the procedure. The patient developed no complications during the procedure. IMPRESSIONS  1. Left  ventricular ejection fraction, by estimation, is 55 to 60%. The left ventricle has normal function. The left ventricle has no regional wall motion abnormalities. Left ventricular diastolic function could not be evaluated.  2. Right ventricular systolic function is normal. The right ventricular size is normal.  3. Left atrial size was mild to moderately dilated. No left atrial/left atrial appendage thrombus was detected.  4. The mitral valve is normal in structure. Mild mitral valve regurgitation. No evidence of mitral stenosis.  5. By PHT there is mild aortic insufficiency, but visually there appears to be moderate AI eccentrically directed in the LVOT towards the anterior MV leaflet. The aortic valve is tricuspid. Mild aortic valve sclerosis is present, with no evidence of aortic valve stenosis.  6. The inferior vena cava is normal in size with greater than 50% respiratory variability, suggesting right atrial pressure of 3 mmHg.  7. Agitated saline contrast bubble study was negative, with no evidence of any interatrial shunt. No LA/LAA thrombus identified. Negative bubble study for interatrial shunt. No intracardiac source of embolism detected on this on this transesophageal echocardiogram. FINDINGS  Left Ventricle: Left ventricular ejection fraction, by estimation, is 55 to 60%. The left ventricle has normal function. The left ventricle has no regional wall motion abnormalities. The left ventricular internal cavity size was normal in size. There is  no left ventricular hypertrophy. Left ventricular diastolic function could not be evaluated. Right Ventricle: The right ventricular size is normal. No increase in right ventricular wall thickness. Right ventricular systolic function is normal. Left Atrium: Left atrial size was mild to moderately dilated. Spontaneous echo contrast was present in the left atrium. No left atrial/left atrial appendage thrombus was detected. Right Atrium: Right atrial size was normal in  size. Pericardium: There is no evidence of pericardial effusion. Mitral Valve: The mitral valve is normal in structure. Mild mitral valve regurgitation. No evidence of mitral valve stenosis. Tricuspid Valve: The tricuspid valve is normal in structure. Tricuspid valve regurgitation is mild . No evidence of tricuspid stenosis. Aortic Valve: By PHT there is mild aortic insufficiency, but visually there appears to be moderate AI eccentrically directed in the LVOT towards the anterior MV leaflet. The aortic valve is tricuspid. Aortic valve regurgitation is moderate. Aortic regurgitation PHT measures 606 msec. Mild aortic valve sclerosis is present, with no evidence of aortic valve stenosis. Pulmonic Valve: The pulmonic valve was normal in structure. Pulmonic valve regurgitation is trivial. No evidence of pulmonic stenosis. Aorta:  Aortic root could not be assessed. Venous: The inferior vena cava is normal in size with greater than 50% respiratory variability, suggesting right atrial pressure of 3 mmHg. IAS/Shunts: No atrial level shunt detected by color flow Doppler. Agitated saline contrast was given intravenously to evaluate for intracardiac shunting. Agitated saline contrast bubble study was negative, with no evidence of any interatrial shunt. There  is no evidence of a patent foramen ovale. There is no evidence of an atrial septal defect.  AORTIC VALVE AI PHT:      606 msec  AORTA Ao Asc diam: 3.40 cm Armanda Magic MD Electronically signed by Armanda Magic MD Signature Date/Time: 09/28/2020/2:15:14 PM    Final    CT Angio Chest/Abd/Pel for Dissection W and/or Wo Contrast  Result Date: 09/23/2020 CLINICAL DATA:  Abdominal pain, question aortic dissection EXAM: CT ANGIOGRAPHY CHEST, ABDOMEN AND PELVIS TECHNIQUE: Non-contrast CT of the chest was initially obtained. Multidetector CT imaging through the chest, abdomen and pelvis was performed using the standard protocol during bolus administration of intravenous contrast.  Multiplanar reconstructed images and MIPs were obtained and reviewed to evaluate the vascular anatomy. CONTRAST:  OMNIPAQUE IOHEXOL 350 MG/ML SOLN COMPARISON:  None. FINDINGS: CTA CHEST FINDINGS Cardiovascular: --Heart: The heart size is normal.  There is nopericardial effusion. --Aorta: The course and caliber of the thoracic aorta are normal. There is no aortic atherosclerotic calcification. Precontrast images show no aortic intramural hematoma. There is no blood pool, dissection or penetrating ulcer demonstrated on arterial phase postcontrast imaging. There is a origin of the left carotid artery the brachiocephalic trunk. Minimal calcifications seen at the origin of brachiocephalic trunk. The proximal arch vessels are widely patent. --Pulmonary Arteries: Contrast timing is optimized for preferential opacification of the aorta. Within that limitation, normal central pulmonary arteries. Mediastinum/Nodes: No mediastinal, hilar or axillary lymphadenopathy. The visualized thyroid and thoracic esophageal course are unremarkable. Lungs/Pleura: Mild ground-glass opacity seen at the posterior right lung base. There is a calcified granuloma seen in the right middle lobe. No focal airspace consolidation. No focal pleural abnormality. Musculoskeletal: No chest wall abnormality. No acute osseous findings. Review of the MIP images confirms the above findings. CTA ABDOMEN AND PELVIS FINDINGS VASCULAR Aorta: Normal caliber aorta without aneurysm, dissection, vasculitis or hemodynamically significant stenosis. There is scattered mild aortic atherosclerosis at the aorta bi-iliac bifurcation. Celiac: No aneurysm, dissection or hemodynamically significant stenosis. Normal branching pattern SMA: Widely patent without dissection or stenosis. Renals: Single renal arteries bilaterally. No aneurysm, dissection, stenosis or evidence of fibromuscular dysplasia. IMA: Patent without abnormality. Inflow: Scattered atherosclerosis at the  common iliac vasculature. Veins: Normal course and caliber of the major veins. Assessment is otherwise limited by the arterial dominant contrast phase. Review of the MIP images confirms the above findings. NON-VASCULAR Hepatobiliary: Normal hepatic contours and density. No visible biliary dilatation. The patient is status post cholecystectomy. No biliary ductal dilation. Pancreas: Normal contours without ductal dilatation. No peripancreatic fluid collection. 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 Adrenals/Urinary Tract: --Adrenal glands: Normal. --Right kidney/ureter: No hydronephrosis or perinephric stranding. No nephrolithiasis. No obstructing ureteral stones. --Left kidney/ureter: No hydronephrosis or perinephric stranding. No nephrolithiasis. No obstructing ureteral stones. --Urinary bladder: Unremarkable. Stomach/Bowel: --Stomach/Duodenum: No hiatal hernia or other gastric abnormality. Normal duodenal course and caliber. --Small bowel: No dilatation or inflammation. --Colon: No focal abnormality. --Appendix: Normal. Lymphatic:  No abdominal or pelvic lymphadenopathy. Reproductive: No free fluid in the pelvis. Musculoskeletal. No bony spinal canal stenosis or focal osseous abnormality. Other: None. Review of the  MIP images confirms the above findings. IMPRESSION: No acute aortic abnormality. Mild aortic atherosclerosis.  Aortic Atherosclerosis (ICD10-I70.0). No other acute intrathoracic or pelvic pathology. Electronically Signed   By: Jonna Clark M.D.   On: 09/23/2020 19:14   VAS [place] LOWER EXTREMITY VENOUS (DVT)  Result Date: 09/25/2020  Lower Venous DVT Study Indications: Stroke.  Comparison Study: No prior study Performing Technologist: Gertie Fey MHA, RDMS, RVT, RDCS  Examination Guidelines: A complete evaluation includes B-mode imaging, spectral Doppler, color Doppler, and power Doppler as needed of all accessible portions of each vessel. Bilateral testing is considered an integral part  of a complete examination. Limited examinations for reoccurring indications may be performed as noted. The reflux portion of the exam is performed with the patient in reverse Trendelenburg.  +---------+---------------+---------+-----------+----------+--------------+ RIGHT    CompressibilityPhasicitySpontaneityPropertiesThrombus Aging +---------+---------------+---------+-----------+----------+--------------+ CFV      Full           Yes      Yes                                 +---------+---------------+---------+-----------+----------+--------------+ SFJ      Full                                                        +---------+---------------+---------+-----------+----------+--------------+ FV Prox  Full                                                        +---------+---------------+---------+-----------+----------+--------------+ FV Mid   Full                                                        +---------+---------------+---------+-----------+----------+--------------+ FV DistalFull                                                        +---------+---------------+---------+-----------+----------+--------------+ PFV      Full                                                        +---------+---------------+---------+-----------+----------+--------------+ POP      Full           Yes      Yes                                 +---------+---------------+---------+-----------+----------+--------------+ PTV      Full                                                        +---------+---------------+---------+-----------+----------+--------------+  PERO     Full                                                        +---------+---------------+---------+-----------+----------+--------------+   +---------+---------------+---------+-----------+----------+--------------+ LEFT     CompressibilityPhasicitySpontaneityPropertiesThrombus Aging  +---------+---------------+---------+-----------+----------+--------------+ CFV      Full           Yes      Yes                                 +---------+---------------+---------+-----------+----------+--------------+ SFJ      Full                                                        +---------+---------------+---------+-----------+----------+--------------+ FV Prox  Full                                                        +---------+---------------+---------+-----------+----------+--------------+ FV Mid   Full                                                        +---------+---------------+---------+-----------+----------+--------------+ FV DistalFull                                                        +---------+---------------+---------+-----------+----------+--------------+ PFV      Full                                                        +---------+---------------+---------+-----------+----------+--------------+ POP      Full           Yes      Yes                                 +---------+---------------+---------+-----------+----------+--------------+ PTV      Full                                                        +---------+---------------+---------+-----------+----------+--------------+ PERO     Full                                                        +---------+---------------+---------+-----------+----------+--------------+  Summary: RIGHT: - There is no evidence of deep vein thrombosis in the lower extremity.  - No cystic structure found in the popliteal fossa.  LEFT: - There is no evidence of deep vein thrombosis in the lower extremity.  - No cystic structure found in the popliteal fossa.  *See table(s) above for measurements and observations. Electronically signed by Lemar Livings MD on 09/25/2020 at 3:31:03 PM.    Final         The results of significant diagnostics from this hospitalization (including  imaging, microbiology, ancillary and laboratory) are listed below for reference.     Microbiology: Recent Results (from the past 240 hour(s))  Respiratory Panel by RT PCR (Flu A&B, Covid) - Nasopharyngeal Swab     Status: None   Collection Time: 10/10/20  5:13 PM   Specimen: Nasopharyngeal Swab; Nasopharyngeal(NP) swabs in vial transport medium  Result Value Ref Range Status   SARS Coronavirus 2 by RT PCR NEGATIVE NEGATIVE Final    Comment: (NOTE) SARS-CoV-2 target nucleic acids are NOT DETECTED.  The SARS-CoV-2 RNA is generally detectable in upper respiratoy specimens during the acute phase of infection. The lowest concentration of SARS-CoV-2 viral copies this assay can detect is 131 copies/mL. A negative result does not preclude SARS-Cov-2 infection and should not be used as the sole basis for treatment or other patient management decisions. A negative result may occur with  improper specimen collection/handling, submission of specimen other than nasopharyngeal swab, presence of viral mutation(s) within the areas targeted by this assay, and inadequate number of viral copies (<131 copies/mL). A negative result must be combined with clinical observations, patient history, and epidemiological information. The expected result is Negative.  Fact Sheet for Patients:  https://www.moore.com/  Fact Sheet for Healthcare Providers:  https://www.young.biz/  This test is no t yet approved or cleared by the [place] FDA and  has been authorized for detection and/or diagnosis of SARS-CoV-2 by FDA under an Emergency Use Authorization (EUA). This EUA will remain  in effect (meaning this test can be used) for the duration of the COVID-19 declaration under Section 564(b)(1) of the Act, 21 U.S.C. section 360bbb-3(b)(1), unless the authorization is terminated or revoked sooner.     Influenza A by PCR NEGATIVE NEGATIVE Final   Influenza B by PCR  NEGATIVE NEGATIVE Final    Comment: (NOTE) The Xpert Xpress SARS-CoV-2/FLU/RSV assay is intended as an aid in  the diagnosis of influenza from Nasopharyngeal swab specimens and  should not be used as a sole basis for treatment. Nasal washings and  aspirates are unacceptable for Xpert Xpress SARS-CoV-2/FLU/RSV  testing.  Fact Sheet for Patients: https://www.moore.com/  Fact Sheet for Healthcare Providers: https://www.young.biz/  This test is not yet approved or cleared by the [place] FDA and  has been authorized for detection and/or diagnosis of SARS-CoV-2 by  FDA under an Emergency Use Authorization (EUA). This EUA will remain  in effect (meaning this test can be used) for the duration of the  Covid-19 declaration under Section 564(b)(1) of the Act, 21  U.S.C. section 360bbb-3(b)(1), unless the authorization is  terminated or revoked. Performed at [facility], [address], [place], [place] [address]      Labs: BNP (last 3 results) Recent Labs    09/25/20 1807 09/26/20 0500  BNP 411.2* 346.3*   Basic Metabolic Panel: Recent Labs  Lab 10/11/20 1639 10/12/20 0414 10/14/20 0303 10/15/20 0350  NA 135 134* 136 136  K 3.8 3.0* 4.0 4.0  CL 96* 90* 99 97*  CO2 24 29 25 22   GLUCOSE 92 98 101* 88  BUN 14 15 16 16   CREATININE 1.65* 1.67* 1.73* 1.71*  CALCIUM 9.9 10.0 9.8 10.1  MG 2.2 2.1 2.1 2.2  PHOS 2.8 3.3 2.9 3.6   Liver Function Tests: Recent Labs  Lab 10/11/20 1639 10/12/20 0414 10/14/20 0303 10/15/20 0350  AST 73* 66*  --   --   ALT 82* 93*  --   --   ALKPHOS 133* 162*  --   --   BILITOT 0.9 1.2  --   --   PROT 7.3 7.7  --   --   ALBUMIN 4.4 4.5 4.0 4.3   No results for input(s): LIPASE, AMYLASE in the last 168 hours. No results for input(s): AMMONIA in the last 168 hours. CBC: Recent Labs  Lab 10/11/20 1639 10/12/20 0414 10/15/20 0350  WBC 7.1 8.5 7.2  HGB 17.6* 17.6* 16.9  HCT 48.6 47.1 46.5   MCV 86.8 82.8 84.7  PLT 358 501* 419*   Cardiac Enzymes: No results for input(s): CKTOTAL, CKMB, CKMBINDEX, TROPONINI in the last 168 hours. BNP: Invalid input(s): POCBNP CBG: No results for input(s): GLUCAP in the last 168 hours. D-Dimer No results for input(s): DDIMER in the last 72 hours. Hgb A1c No results for input(s): HGBA1C in the last 72 hours. Lipid Profile No results for input(s): CHOL, HDL, LDLCALC, TRIG, CHOLHDL, LDLDIRECT in the last 72 hours. Thyroid function studies No results for input(s): TSH, T4TOTAL, T3FREE, THYROIDAB in the last 72 hours.  Invalid input(s): FREET3 Anemia work up No results for input(s): VITAMINB12, FOLATE, FERRITIN, TIBC, IRON, RETICCTPCT in the last 72 hours. Urinalysis    Component Value Date/Time   COLORURINE YELLOW 10/08/2019 0456   APPEARANCEUR CLEAR 10/08/2019 0456   LABSPEC 1.013 10/08/2019 0456   PHURINE 6.0 10/08/2019 0456   GLUCOSEU NEGATIVE 10/08/2019 0456   HGBUR NEGATIVE 10/08/2019 0456   BILIRUBINUR NEGATIVE 10/08/2019 0456   KETONESUR NEGATIVE 10/08/2019 0456   PROTEINUR NEGATIVE 10/08/2019 0456   NITRITE NEGATIVE 10/08/2019 0456   LEUKOCYTESUR NEGATIVE 10/08/2019 0456   Sepsis Labs Invalid input(s): PROCALCITONIN,  WBC,  LACTICIDVEN   Time coordinating discharge: 35 minutes  SIGNED:  10/10/2019, MD  [facility] 10/18/2020, 2:44 PM  If 7PM-7AM, please contact night-coverage [email or web address]

## 2020-10-27 ENCOUNTER — Inpatient Hospital Stay: Payer: Medicaid - Out of State | Admitting: Adult Health

## 2020-10-27 NOTE — Progress Notes (Deleted)
[facility] [address] [place]. [place] [address] [phone]       HOSPITAL FOLLOW UP NOTE  [name] Date of Birth:  1976/09/25 Medical Record Number:  [id number]   Reason for Referral:  hospital stroke follow up    SUBJECTIVE:   CHIEF COMPLAINT:  No chief complaint on file.   HPI:     ROS:   14 system review of systems performed and negative with exception of ***  PMH:  Past Medical History:  Diagnosis Date  . Anemia   . Chest pain   . Glaucoma   . Hypertension   . LVH (left ventricular hypertrophy)   . Spleen enlarged   . Stroke (HCC)   . Thrombocytopenia (HCC)     PSH:  Past Surgical History:  Procedure Laterality Date  . BUBBLE STUDY  09/28/2020   Procedure: BUBBLE STUDY;  Surgeon: [name], MD;  Location: [facility] ENDOSCOPY;  Service: Cardiovascular;;  . CHOLECYSTECTOMY    . SPLENECTOMY, 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   Procedure: SPLENECTOMY;  Surgeon: [name], MD;  Location: [facility] OR;  Service: General;  Laterality: N/A;  . TEE WITHOUT CARDIOVERSION N/A 09/28/2020   Procedure: TRANSESOPHAGEAL ECHOCARDIOGRAM (TEE);  Surgeon: [name], MD;  Location: [facility] ENDOSCOPY;  Service: Cardiovascular;  Laterality: N/A;    Social History:  Social History   Socioeconomic History  . Marital status: Single    Spouse name: Not on file  . Number of children: Not on file  . Years of education: Not on file  . Highest education level: Not on file  Occupational History  . Not on file  Tobacco Use  . Smoking status: Former Smoker    Quit date: 09/19/2020    Years since quitting: 0.1  . Smokeless tobacco: Never Used  Vaping Use  . Vaping Use: Never used  Substance and Sexual Activity  . Alcohol use: No  . Drug use: No  . Sexual activity: Not on file  Other Topics Concern  . Not on file  Social History Narrative   Used to do Holiday representative work   Social Determinants of Corporate investment banker Strain:   . Difficulty  of Paying Living Expenses: Not on file  Food Insecurity:   . Worried About Programme researcher, broadcasting/film/video in the Last Year: Not on file  . Ran Out of Food in the Last Year: Not on file  Transportation Needs:   . Lack of Transportation (Medical): Not on file  . Lack of Transportation (Non-Medical): Not on file  Physical Activity:   . Days of Exercise per Week: Not on file  . Minutes of Exercise per Session: Not on file  Stress:   . Feeling of Stress : Not on file  Social Connections:   . Frequency of Communication with Friends and Family: Not on file  . Frequency of Social Gatherings with Friends and Family: Not on file  . Attends Religious Services: Not on file  . Active Member of Clubs or Organizations: Not on file  . Attends Banker Meetings: Not on file  . Marital Status: Not on file  Intimate Partner Violence:   . Fear of Current or Ex-Partner: Not on file  . Emotionally Abused: Not on file  . Physically Abused: Not on file  . Sexually Abused: Not on file    Family History:  Family History  Problem Relation Age of Onset  . Sickle cell trait Mother     Medications:  Current Outpatient Medications on File Prior to Visit  Medication Sig Dispense Refill  . acetaminophen (TYLENOL) 325 MG tablet Take 650 mg by mouth every 6 (six) hours as needed for moderate pain or headache.    Marland Kitchen amLODipine (NORVASC) 10 MG tablet Take 1 tablet (10 mg total) by mouth daily. 30 tablet 0  . aspirin 81 MG EC tablet Take 1 tablet (81 mg total) by mouth daily for 21 days. Stop after three weeks 21 tablet 0  . atorvastatin (LIPITOR) 40 MG tablet Take 1 tablet (40 mg total) by mouth daily. 30 tablet 0  . carvedilol (COREG) 6.25 MG tablet Take 0.5 tablets (3.125 mg total) by mouth 2 (two) times daily with a meal. 180 tablet 1  . cloNIDine (CATAPRES) 0.2 MG tablet Take 1 tablet (0.2 mg total) by mouth 2 (two) times daily. 60 tablet 0  . clopidogrel (PLAVIX) 75 MG tablet Take 1 tablet (75 mg total) by  mouth daily. 90 tablet 1  . folic acid (FOLVITE) 1 MG tablet Take 1 tablet (1 mg total) by mouth daily. 30 tablet 0  . furosemide (LASIX) 40 MG tablet Take 1 tablet (40 mg total) by mouth daily. 30 tablet 0  . Multiple Vitamin (MULTIVITAMIN WITH MINERALS) TABS tablet Take 1 tablet by mouth daily.    . pantoprazole (PROTONIX) 40 MG tablet Take 1 tablet (40 mg total) by mouth daily. 30 tablet 0  . senna-docusate (SENOKOT-S) 8.6-50 MG tablet Take 2 tablets by mouth at bedtime as needed for mild constipation or moderate constipation. (Patient not taking: Reported on 10/10/2020) 30 tablet 0  . sucralfate (CARAFATE) 1 g tablet Take 1 tablet (1 g total) by mouth 4 (four) times daily -  with meals and at bedtime. 120 tablet 0  . vitamin B-12 1000 MCG tablet Take 1 tablet (1,000 mcg total) by mouth daily. 30 tablet 0   No current facility-administered medications on file prior to visit.    Allergies:   Allergies  Allergen Reactions  . Morphine And Related Other (See Comments)    Severe abdominal pain  . Fentanyl Other (See Comments)    Mania      OBJECTIVE:  Physical Exam  There were no vitals filed for this visit. There is no height or weight on file to calculate BMI. No exam data present  No flowsheet data found.   General: well developed, well nourished, seated, in no evident distress Head: head normocephalic and atraumatic.   Neck: supple with no carotid or supraclavicular bruits Cardiovascular: regular rate and rhythm, no murmurs Musculoskeletal: no deformity Skin:  no rash/petichiae Vascular:  Normal pulses all extremities   Neurologic Exam Mental Status: Awake and fully alert. Oriented to place and time. Recent and remote memory intact. Attention span, concentration and fund of knowledge appropriate. Mood and affect appropriate.  Cranial Nerves: Fundoscopic exam reveals sharp disc margins. Pupils equal, briskly reactive to light. Extraocular movements full without nystagmus.  Visual fields full to confrontation. Hearing intact. Facial sensation intact. Face, tongue, palate moves normally and symmetrically.  Motor: Normal bulk and tone. Normal strength in all tested extremity muscles. Sensory.: intact to touch , pinprick , position and vibratory sensation.  Coordination: Rapid alternating movements normal in all extremities. Finger-to-nose and heel-to-shin performed accurately bilaterally. Gait and Station: Arises from chair without difficulty. Stance is normal. Gait demonstrates normal stride length and balance Reflexes: 1+ and symmetric. Toes downgoing.     NIHSS  *** Modified Rankin  *** CHA2DS2-VASc *** HAS-BLED ***  ASSESSMENT: Ala Capri is a 44 y.o. year old male presented with *** on *** secondary to ***. Vascular risk factors include ***.      PLAN:  1. *** : Residual deficit: ***. Continue {anticoagulants:31417}  and ***  for secondary stroke prevention. Close PCP follow up for aggressive stroke risk factor management  2. HTN: BP goal <130/90. Continue f/u with PCP 3. HLD: LDL goal <70. Recent LDL ***. F/u with PCP for management as well as prescribing of statin 4. DMII: A1c goal<7.0. Recent A1c ***. F/u with PCP    Follow up in *** or call earlier if needed   I spent *** minutes of face-to-face and non-face-to-face time with patient.  This included previsit chart review, lab review, study review, order entry, electronic health record documentation, patient education regarding recent stroke, residual deficits, importance of managing stroke risk factors and answered all questions to patient satisfaction     Ihor Austin, [facility]  [facility] [address] [place], [place] [address]  Phone [phone] Fax [phone] Note: This document was prepared with digital dictation and possible smart phrase technology. Any transcriptional errors that result from this process are unintentional.

## 2020-10-30 NOTE — Progress Notes (Deleted)
Cardiology Office Note:    Date:  10/30/2020   ID:  Alejandro Perez, DOB 1976/04/14, MRN [id number]  PCP:  Pcp, No  Cardiologist:  Sanda Klein, MD  Electrophysiologist:  None   Referring MD: No ref. provider found   Chief Complaint: hospital follow-up for chest pain  History of Present Illness:    Alejandro Perez is a 44 y.o. male with a history of ischemic stroke as well as small subarachnoid hemorrhage after fall in 09/2020, HFpEF, hypertension, hyperlipidemia, TTP s/p splenectomy, CKD stage III, chronic pain, and polysubstance abuse (including marijuana, ectasy, cocaine) who presents today for hospital follow-up for chest pain.   Patient was first seen by [facility] in 12/2017 during an admission for chest pain and concerns for TTP. Echo showed normal LVEF with grade 2 diastolic dysfunction, moderate AI, and moderately elevated PASP. Chest pain was not felt to be ischemic in nature. He was not seen in our office following this admission.   Patient was admitted from 09/23/2020 to 09/28/2020 for CVA after leaving AMA from [facility] because he was unsatisfied with his care. During his stay, he had continuous chest tightness. Chest CTA was negative for dissection. TTE showed LVEF of 55-60% with normal wall motion, severe LVH, mild to moderate AI, mild MR. TEE showed no left atrial thrombus and bubble study was negative. No atrial fibrillation was noted on telemetry during admission; therefore, outpatient monitor was arranged. No formal Cardiology consult was obtained during this admission.  Patient readmitted from 10/10/2020 to 10/18/2020 for continuous chest pain for several weeks. He did have some chest wall tenderness and also some pleuritic pain.  EKG showed sinus rhythm with LVH with J point elevation and T wave inversions consistent with repolarization changes. High-sensitivity troponin negative x2. Chest pain was not felt to be ischemic in nature and no further inpatient work-up  was recommended. Patient demonstrated drug seeking behavior during admission. He did suffer a fall on 10/13/2020 and head CT showed subarachnoid hemorrhage over left frontal lobe; therefore, DAPT was held. He did initially receive multiple doses of IV Dilaudid for complaints of severe headache, chest pain, and abdominal pain. Given his substance use history, vague description of pain, and absence of objective findings, IV pain medications were discontinued and PO medications were started. However, patient refused to take PO medications and threatened to leave AMA multiple times. He did end up leaving AMA to "go to another hospital where he can get IV medications." Patient was then admitted at [facility]. Repeat head CT showed stable subarachnoid hemorrhage. It was recommended that antiplatelet therapy be held until 10/22/2020. If head CT stable at that time, could restart DAPT with Aspirin and Plavix for 90 days followed by Aspirin $RemoveBe'325mg'SfHvMywSK$  daily. 2 week Zio monitor was ordered to rule out atrial fibrillation. Antihypertensives were adjusted for additional BP control.   Patient presents today for follow-up. ***  Chest Pain  CVA - History or recent ischemic stroke in 09/2020. Also developed small subarachnoid hemorrhage after a fall. - DAPT. ***  Hypertension  Hyperlipidemia - Most recent lipid panel in 09/2020: Total Cholesterol 186, Triglycerides 62, HDL 44, LDL 130.  - Continue Lipitor $RemoveBeforeDE'40mg'gcEMysRCCSrWlWf$  daily. - Will need repeat lipid panel and LFTs in 2 months.   CKD Stage III - Creatinine 1.98 at discharge. Baseline 1.6 to 1.9.  - Will check CMET today. ***  Elevated Transaminases  - CMET on 10/19/2020 at [facility] showed AST 95, ALT 221, Alk Phos 174. Total Bili normal. -  Repeat CMET today.    Past Medical History:  Diagnosis Date  . Anemia   . Chest pain   . Glaucoma   . Hypertension   . LVH (left ventricular hypertrophy)   . Spleen enlarged   . Stroke (HCC)   . Thrombocytopenia (HCC)      Past Surgical History:  Procedure Laterality Date  . BUBBLE STUDY  09/28/2020   Procedure: BUBBLE STUDY;  Surgeon: [name], MD;  Location: [facility] ENDOSCOPY;  Service: Cardiovascular;;  . CHOLECYSTECTOMY    . SPLENECTOMY, 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   Procedure: SPLENECTOMY;  Surgeon: [name], MD;  Location: [facility] OR;  Service: General;  Laterality: N/A;  . TEE WITHOUT CARDIOVERSION N/A 09/28/2020   Procedure: TRANSESOPHAGEAL ECHOCARDIOGRAM (TEE);  Surgeon: [name], MD;  Location: [facility] ENDOSCOPY;  Service: Cardiovascular;  Laterality: N/A;    Current Medications: No outpatient medications have been marked as taking for the 11/01/20 encounter (Appointment) with Corrin Parker, PA-C.     Allergies:   Morphine and related and Fentanyl   Social History   Socioeconomic History  . Marital status: Single    Spouse name: Not on file  . Number of children: Not on file  . Years of education: Not on file  . Highest education level: Not on file  Occupational History  . Not on file  Tobacco Use  . Smoking status: Former Smoker    Quit date: 09/19/2020    Years since quitting: 0.1  . Smokeless tobacco: Never Used  Vaping Use  . Vaping Use: Never used  Substance and Sexual Activity  . Alcohol use: No  . Drug use: No  . Sexual activity: Not on file  Other Topics Concern  . Not on file  Social History Narrative   Used to do Holiday representative work   Social Determinants of Corporate investment banker Strain: Not on [employer] Insecurity: Not on file  Transportation Needs: Not on file  Physical Activity: Not on file  Stress: Not on file  Social Connections: Not on file     Family History: The patient's ***family history includes Sickle cell trait in his mother.  ROS:   Please see the history of present illness.    *** All other systems reviewed and are negative.  EKGs/Labs/Other Studies Reviewed:    The following studies were reviewed today:  Transthoracic  Echocardiogram 09/24/2020: Impressions: 1. Left ventricular ejection fraction, by estimation, is 55 to 60%. The  left ventricle has normal function. The left ventricle has no regional  wall motion abnormalities. There is severe left ventricular hypertrophy.  Left ventricular diastolic parameters  are indeterminate.  2. Right ventricular systolic function is normal. The right ventricular  size is normal.  3. Left atrial size was mild to moderately dilated.  4. Mild mitral valve regurgitation.  5. Aortic insufficiency is eccentric, directed posterior into LV.Marland Kitchen The  aortic valve is abnormal. Aortic valve regurgitation is mild to moderate.  Mild aortic valve sclerosis is present, with no evidence of aortic valve  stenosis.  6. The inferior vena cava is normal in size with greater than 50%  respiratory variability, suggesting right atrial pressure of 3 mmHg.  _______________  Transesophageal Echocardiogram 09/28/2020: Impressions: 1. Left ventricular ejection fraction, by estimation, is 55 to 60%. The  left ventricle has normal function. The left ventricle has no regional  wall motion abnormalities. Left ventricular diastolic function could not  be evaluated.  2. Right ventricular systolic function is normal.  The right ventricular  size is normal.  3. Left atrial size was mild to moderately dilated. No left atrial/left  atrial appendage thrombus was detected.  4. The mitral valve is normal in structure. Mild mitral valve  regurgitation. No evidence of mitral stenosis.  5. By PHT there is mild aortic insufficiency, but visually there appears  to be moderate AI eccentrically directed in the LVOT towards the anterior  MV leaflet. The aortic valve is tricuspid. Mild aortic valve sclerosis is  present, with no evidence of  aortic valve stenosis.  6. The inferior vena cava is normal in size with greater than 50%  respiratory variability, suggesting right atrial pressure of 3 mmHg.   7. Agitated saline contrast bubble study was negative, with no evidence  of any interatrial shunt.   No LA/LAA thrombus identified. Negative bubble study for interatrial  shunt. No intracardiac source of embolism detected on this on this  transesophageal echocardiogram.   EKG:  EKG not ordered today. EKG personally reviewed and demonstrates ***.  Recent Labs: 09/24/2020: TSH 1.938 09/26/2020: B Natriuretic Peptide 346.3 10/12/2020: ALT 93 10/15/2020: BUN 16; Creatinine, Ser 1.71; Hemoglobin 16.9; Magnesium 2.2; Platelets 419; Potassium 4.0; Sodium 136  Recent Lipid Panel    Component Value Date/Time   CHOL 186 09/24/2020 0238   TRIG 62 09/24/2020 0238   HDL 44 09/24/2020 0238   CHOLHDL 4.2 09/24/2020 0238   VLDL 12 09/24/2020 0238   LDLCALC 130 (H) 09/24/2020 0238    Physical Exam:    Vital Signs: There were no vitals taken for this visit.    Wt Readings from Last 3 Encounters:  10/11/20 147 lb 11.3 oz (67 kg)  09/28/20 156 lb 8.4 oz (71 kg)  10/09/19 162 lb (73.5 kg)     General: 44 y.o. male in no acute distress. HEENT: Normocephalic and atraumatic. Sclera clear. EOMs intact. Neck: Supple. No carotid bruits. No JVD. Heart: *** RRR. Distinct S1 and S2. No murmurs, gallops, or rubs. Radial and distal pedal pulses 2+ and equal bilaterally. Lungs: No increased work of breathing. Clear to ausculation bilaterally. No wheezes, rhonchi, or rales.  Abdomen: Soft, non-distended, and non-tender to palpation. Bowel sounds present in all 4 quadrants.  MSK: Normal strength and tone for age. *** Extremities: No lower extremity edema.    Skin: Warm and dry. Neuro: Alert and oriented x3. No focal deficits. Psych: Normal affect. Responds appropriately.   Assessment:    No diagnosis found.  Plan:     Disposition: Follow up in ***   Medication Adjustments/Labs and Tests Ordered: Current medicines are reviewed at length with the patient today.  Concerns regarding medicines  are outlined above.  No orders of the defined types were placed in this encounter.  No orders of the defined types were placed in this encounter.   There are no Patient Instructions on file for this visit.   Signed, Darreld Mclean, PA-C  10/30/2020 11:15 AM    [place]

## 2020-11-01 ENCOUNTER — Ambulatory Visit: Payer: Medicaid Other | Admitting: Student

## 2021-06-03 DIAGNOSIS — I351 Nonrheumatic aortic (valve) insufficiency: Secondary | ICD-10-CM

## 2021-06-20 DIAGNOSIS — R079 Chest pain, unspecified: Secondary | ICD-10-CM | POA: Diagnosis not present

## 2021-06-21 DIAGNOSIS — R079 Chest pain, unspecified: Secondary | ICD-10-CM

## 2022-10-27 ENCOUNTER — Emergency Department (HOSPITAL_COMMUNITY): Payer: Medicaid Other

## 2022-10-27 ENCOUNTER — Observation Stay (HOSPITAL_COMMUNITY): Payer: Medicaid Other

## 2022-10-27 ENCOUNTER — Inpatient Hospital Stay (HOSPITAL_COMMUNITY)
Admission: EM | Admit: 2022-10-27 | Discharge: 2022-10-30 | DRG: 092 | Disposition: A | Discharge status: Home / Self Care | Payer: Medicaid Other | Attending: Family Medicine | Admitting: Family Medicine

## 2022-10-27 ENCOUNTER — Other Ambulatory Visit: Payer: Self-pay

## 2022-10-27 DIAGNOSIS — G40909 Epilepsy, unspecified, not intractable, without status epilepticus: Secondary | ICD-10-CM | POA: Diagnosis present

## 2022-10-27 DIAGNOSIS — I639 Cerebral infarction, unspecified: Secondary | ICD-10-CM | POA: Diagnosis present

## 2022-10-27 DIAGNOSIS — H409 Unspecified glaucoma: Secondary | ICD-10-CM | POA: Diagnosis present

## 2022-10-27 DIAGNOSIS — G8384 Todd's paralysis (postepileptic): Principal | ICD-10-CM | POA: Diagnosis present

## 2022-10-27 DIAGNOSIS — N179 Acute kidney failure, unspecified: Secondary | ICD-10-CM

## 2022-10-27 DIAGNOSIS — R471 Dysarthria and anarthria: Secondary | ICD-10-CM | POA: Diagnosis present

## 2022-10-27 DIAGNOSIS — R569 Unspecified convulsions: Secondary | ICD-10-CM | POA: Diagnosis not present

## 2022-10-27 DIAGNOSIS — R079 Chest pain, unspecified: Secondary | ICD-10-CM | POA: Diagnosis present

## 2022-10-27 DIAGNOSIS — R002 Palpitations: Secondary | ICD-10-CM | POA: Diagnosis present

## 2022-10-27 DIAGNOSIS — I5032 Chronic diastolic (congestive) heart failure: Secondary | ICD-10-CM | POA: Diagnosis present

## 2022-10-27 DIAGNOSIS — F1410 Cocaine abuse, uncomplicated: Secondary | ICD-10-CM | POA: Diagnosis present

## 2022-10-27 DIAGNOSIS — Z832 Family history of diseases of the blood and blood-forming organs and certain disorders involving the immune mechanism: Secondary | ICD-10-CM

## 2022-10-27 DIAGNOSIS — Z823 Family history of stroke: Secondary | ICD-10-CM

## 2022-10-27 DIAGNOSIS — F17210 Nicotine dependence, cigarettes, uncomplicated: Secondary | ICD-10-CM | POA: Diagnosis present

## 2022-10-27 DIAGNOSIS — D580 Hereditary spherocytosis: Secondary | ICD-10-CM | POA: Diagnosis present

## 2022-10-27 DIAGNOSIS — K219 Gastro-esophageal reflux disease without esophagitis: Secondary | ICD-10-CM | POA: Diagnosis present

## 2022-10-27 DIAGNOSIS — R531 Weakness: Secondary | ICD-10-CM

## 2022-10-27 DIAGNOSIS — I69351 Hemiplegia and hemiparesis following cerebral infarction affecting right dominant side: Secondary | ICD-10-CM

## 2022-10-27 DIAGNOSIS — J45909 Unspecified asthma, uncomplicated: Secondary | ICD-10-CM | POA: Diagnosis present

## 2022-10-27 DIAGNOSIS — E876 Hypokalemia: Secondary | ICD-10-CM | POA: Insufficient documentation

## 2022-10-27 DIAGNOSIS — G8190 Hemiplegia, unspecified affecting unspecified side: Secondary | ICD-10-CM

## 2022-10-27 DIAGNOSIS — Z885 Allergy status to narcotic agent status: Secondary | ICD-10-CM

## 2022-10-27 DIAGNOSIS — H548 Legal blindness, as defined in USA: Secondary | ICD-10-CM | POA: Diagnosis present

## 2022-10-27 DIAGNOSIS — Z79899 Other long term (current) drug therapy: Secondary | ICD-10-CM

## 2022-10-27 DIAGNOSIS — F1210 Cannabis abuse, uncomplicated: Secondary | ICD-10-CM | POA: Diagnosis present

## 2022-10-27 DIAGNOSIS — I130 Hypertensive heart and chronic kidney disease with heart failure and stage 1 through stage 4 chronic kidney disease, or unspecified chronic kidney disease: Secondary | ICD-10-CM | POA: Diagnosis present

## 2022-10-27 DIAGNOSIS — D696 Thrombocytopenia, unspecified: Secondary | ICD-10-CM | POA: Diagnosis present

## 2022-10-27 DIAGNOSIS — E785 Hyperlipidemia, unspecified: Secondary | ICD-10-CM | POA: Diagnosis present

## 2022-10-27 DIAGNOSIS — Z9081 Acquired absence of spleen: Secondary | ICD-10-CM

## 2022-10-27 DIAGNOSIS — M545 Low back pain, unspecified: Secondary | ICD-10-CM | POA: Diagnosis present

## 2022-10-27 DIAGNOSIS — N183 Chronic kidney disease, stage 3 unspecified: Secondary | ICD-10-CM | POA: Diagnosis present

## 2022-10-27 DIAGNOSIS — Z7902 Long term (current) use of antithrombotics/antiplatelets: Secondary | ICD-10-CM

## 2022-10-27 DIAGNOSIS — I10 Essential (primary) hypertension: Secondary | ICD-10-CM | POA: Diagnosis present

## 2022-10-27 DIAGNOSIS — I69320 Aphasia following cerebral infarction: Secondary | ICD-10-CM

## 2022-10-27 DIAGNOSIS — Z9049 Acquired absence of other specified parts of digestive tract: Secondary | ICD-10-CM

## 2022-10-27 DIAGNOSIS — G8929 Other chronic pain: Secondary | ICD-10-CM | POA: Diagnosis present

## 2022-10-27 DIAGNOSIS — F419 Anxiety disorder, unspecified: Secondary | ICD-10-CM | POA: Diagnosis present

## 2022-10-27 LAB — DIFFERENTIAL
Abs Immature Granulocytes: 0 10*3/uL (ref 0.00–0.07)
Basophils Absolute: 0 10*3/uL (ref 0.0–0.1)
Basophils Relative: 0 %
Eosinophils Absolute: 0 10*3/uL (ref 0.0–0.5)
Eosinophils Relative: 0 %
Lymphocytes Relative: 9 %
Lymphs Abs: 1.1 10*3/uL (ref 0.7–4.0)
Monocytes Absolute: 1.2 10*3/uL — ABNORMAL HIGH (ref 0.1–1.0)
Monocytes Relative: 10 %
Neutro Abs: 9.9 10*3/uL — ABNORMAL HIGH (ref 1.7–7.7)
Neutrophils Relative %: 81 %
nRBC: 0 /100 WBC

## 2022-10-27 LAB — I-STAT CHEM 8, ED
BUN: 11 mg/dL (ref 6–20)
Calcium, Ion: 1.19 mmol/L (ref 1.15–1.40)
Chloride: 88 mmol/L — ABNORMAL LOW (ref 98–111)
Creatinine, Ser: 1.9 mg/dL — ABNORMAL HIGH (ref 0.61–1.24)
Glucose, Bld: 136 mg/dL — ABNORMAL HIGH (ref 70–99)
HCT: 46 % (ref 39.0–52.0)
Hemoglobin: 15.6 g/dL (ref 13.0–17.0)
Potassium: 3.9 mmol/L (ref 3.5–5.1)
Sodium: 131 mmol/L — ABNORMAL LOW (ref 135–145)
TCO2: 30 mmol/L (ref 22–32)

## 2022-10-27 LAB — CBC
HCT: 45.4 % (ref 39.0–52.0)
Hemoglobin: 16.9 g/dL (ref 13.0–17.0)
MCH: 31.9 pg (ref 26.0–34.0)
MCHC: 37.2 g/dL — ABNORMAL HIGH (ref 30.0–36.0)
MCV: 85.8 fL (ref 80.0–100.0)
Platelets: 391 10*3/uL (ref 150–400)
RBC: 5.29 MIL/uL (ref 4.22–5.81)
RDW: 14.6 % (ref 11.5–15.5)
WBC: 12.2 10*3/uL — ABNORMAL HIGH (ref 4.0–10.5)
nRBC: 0 % (ref 0.0–0.2)

## 2022-10-27 LAB — CBG MONITORING, ED: Glucose-Capillary: 136 mg/dL — ABNORMAL HIGH (ref 70–99)

## 2022-10-27 MED ORDER — ATORVASTATIN CALCIUM 40 MG PO TABS
40.0000 mg | ORAL_TABLET | Freq: Every day | ORAL | Status: DC
  Administered 2022-10-28 – 2022-10-30 (×3): 40 mg via ORAL
  Filled 2022-10-27 (×3): qty 1

## 2022-10-27 MED ORDER — LACOSAMIDE 50 MG PO TABS
50.0000 mg | ORAL_TABLET | Freq: Two times a day (BID) | ORAL | Status: DC
  Administered 2022-10-27 – 2022-10-28 (×2): 50 mg via ORAL
  Filled 2022-10-27 (×2): qty 1

## 2022-10-27 MED ORDER — LORAZEPAM 2 MG/ML IJ SOLN
1.0000 mg | INTRAMUSCULAR | Status: DC | PRN
  Administered 2022-10-28: 1 mg via INTRAVENOUS
  Filled 2022-10-27: qty 1

## 2022-10-27 MED ORDER — ENOXAPARIN SODIUM 30 MG/0.3ML IJ SOSY
30.0000 mg | PREFILLED_SYRINGE | INTRAMUSCULAR | Status: DC
  Administered 2022-10-28 – 2022-10-29 (×2): 30 mg via SUBCUTANEOUS
  Filled 2022-10-27 (×2): qty 0.3

## 2022-10-27 MED ORDER — ASPIRIN 81 MG PO TBEC
81.0000 mg | DELAYED_RELEASE_TABLET | Freq: Every day | ORAL | Status: DC
  Administered 2022-10-28: 81 mg via ORAL
  Filled 2022-10-27: qty 1

## 2022-10-27 MED ORDER — CLOPIDOGREL BISULFATE 75 MG PO TABS
75.0000 mg | ORAL_TABLET | Freq: Every day | ORAL | Status: DC
  Administered 2022-10-28 – 2022-10-30 (×3): 75 mg via ORAL
  Filled 2022-10-27 (×3): qty 1

## 2022-10-27 MED ORDER — LEVETIRACETAM 500 MG PO TABS
1000.0000 mg | ORAL_TABLET | Freq: Two times a day (BID) | ORAL | Status: DC
  Administered 2022-10-28 – 2022-10-30 (×5): 1000 mg via ORAL
  Filled 2022-10-27 (×5): qty 2

## 2022-10-27 MED ORDER — LEVETIRACETAM IN NACL 1000 MG/100ML IV SOLN: 1000.0000 mg | Freq: Two times a day (BID) | INTRAVENOUS | Status: DC

## 2022-10-27 MED ORDER — SODIUM CHLORIDE 0.9% FLUSH
3.0000 mL | Freq: Once | INTRAVENOUS | Status: AC
  Administered 2022-10-27: 3 mL via INTRAVENOUS

## 2022-10-27 MED ORDER — IOHEXOL 350 MG/ML SOLN
75.0000 mL | Freq: Once | INTRAVENOUS | Status: AC | PRN
  Administered 2022-10-27: 75 mL via INTRAVENOUS

## 2022-10-27 MED ORDER — LORAZEPAM 2 MG/ML IJ SOLN
INTRAMUSCULAR | Status: AC
  Administered 2022-10-27: 1 mg
  Filled 2022-10-27: qty 1

## 2022-10-27 MED ORDER — SODIUM CHLORIDE 0.9 % IV SOLN
50.0000 mg | Freq: Two times a day (BID) | INTRAVENOUS | Status: DC
  Filled 2022-10-27 (×3): qty 5

## 2022-10-27 MED ORDER — HYDROXYZINE HCL 10 MG PO TABS
10.0000 mg | ORAL_TABLET | Freq: Once | ORAL | Status: AC
  Administered 2022-10-27: 10 mg via ORAL
  Filled 2022-10-27: qty 1

## 2022-10-27 MED ORDER — LEVETIRACETAM IN NACL 1500 MG/100ML IV SOLN
1500.0000 mg | Freq: Once | INTRAVENOUS | Status: AC
  Administered 2022-10-27: 1500 mg via INTRAVENOUS
  Filled 2022-10-27 (×2): qty 100

## 2022-10-27 MED ORDER — ACETAMINOPHEN 325 MG PO TABS
650.0000 mg | ORAL_TABLET | Freq: Four times a day (QID) | ORAL | Status: DC | PRN
  Administered 2022-10-28 – 2022-10-30 (×5): 650 mg via ORAL
  Filled 2022-10-27 (×5): qty 2

## 2022-10-27 NOTE — H&P (Cosign Needed)
Hospital Admission History and Physical Service Pager: [phone]  Patient name: Alejandro Perez Medical record number: [id number] Date of Birth: 1976-09-01 Age: 46 y.o. Gender: male  Primary Care Provider: Pcp, No Consultants: Neurology Code Status: Full code Preferred Emergency Contact:  Contact Information     Name Relation Home Work Mobile   Berna Spare Significant other   [phone]        Chief Complaint: R sided weakness  Assessment and Plan: Lemont Duel is a 46 y.o. male presenting with R sided weakness and seizures. Differential for this patient's presentation of this includes acute CVA given R sided weakness for over the past day. Seizures most likely due to underlying seizure disorder worsening secondary to acute neurologic change from CVA. 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  * Acute right-sided weakness Onset around 2000 on 12/7 with complete loss of function of R side. Presented as code stroke and evaluated by Neurology but patient outside window for intervention. CT head negative. Per neurology most likely a new stroke and admitted for further work up. Upon admission patient with complete R sided hemiplegia and mild headache. 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 H/o TTP with splenectomy, some concern for underlying coagulability risk. -Admitted to FMTS service, Dr. Ardelia Mems attending -Neurology consulted, appreciate recommendations -MRI wo contract pending -Echo -Lipid panel, A1c ordered -Neuro checks q4h -Hold home Coreg. Permissive hypertension first 24 h < 220/110 -Start DAPT therapy -Passed bedside swallow, start on heart healthy diet -PT/OT/SLP consulted -Tylenol 650mg  q6h prn HA -Continuous cardiac monitoring for arrythmia  Seizure disorder ([place]) Seizure x 3 in the past 24 hours. Compliant on  Keppra 1500mg  BID at home daily. Seen at [facility] ED yesterday and started on Vimpat 50mg  BID. Neurology consulted and recommended Keppra load and renally dosing maintenance and continuing Vimpat. -Neurology consulted -Keppra load 1500mg  IV -Keppra 1000mg  BID (renal dosing) -Vimpat 50mg  BID -Ativan 1mg  prn seizure activity -Seizure precautions  Acute-on-chronic kidney injury ([place]) Cr 1.93 upon admission (baseline Cr ~1.7). -Renally dosed medications -Avoid nephrotoxic agents -Strict I&O  Anxiety H/o anxiety, takes Ativan prn at home. Relates some anxiety upon admission. -Consider restarting home medication  HTN (hypertension) Mildly hypertensive to 150s/90s upon admission. Given concern for CVA, allow for permissive HTN as above. -Hold home BP meds  Chest pain Appears to be a chronic issue per chart review. Patient relates it has been worse today, unable to qualify how it feels. Endorses some palpitations. EKG negative for acute changes. -Troponins to r/o ACS     FEN/GI: NPO pending bedside swallow VTE Prophylaxis: Lovenox  Disposition: Med tele  History of Present Illness:  Alejandro Perez is a 46 y.o. male presenting with R sided weakness that began suddenly around 2000 yesterday. States he had residual weakness in that side after prior CVA but he was able to move his extremities including walking with a walker. Patient also with expressive aphasia which he says is unchanged from baseline. States he has been compliant with his medications and took them today.  Patient also presenting with breakthrough seizures x 3. After first seizure yesterday he was taken to [place] and discharged. He then had another seizure but declined ED transport. Patient then had another seizure that accompanied the R sided weakness.  Currently, patient asked for IV pain medication for a headache. Patient also endorse pain in the  center of his chest he relates began today. Unable to say if its pressure,  just relates it hurts. Associated with palpitations.  In the ED, patient presented as code stroke and received neurology evaluation. Outside of window for intervention. CT head negative for acute change. Admitted for further stroke work up.  Review Of Systems: Per HPI with the following additions: Headache, R sided weakness  Pertinent Past Medical History: HTN HLD Polysubstance abuse - cocaine, THC Glaucoma - legally blind Chronic LBP GERD Atypical angina CKD3 Seizure disorder Asthma Remainder reviewed in history tab.   Pertinent Past Surgical History: Splenectomy d/t TTP Remainder reviewed in history tab.   Pertinent Social History: Tobacco use: 2 cigarettes a day x years Alcohol use: No  Other Substance use: THC - 2 blunts per day. Cocaine - last used 2 years ago .  Pertinent Family History: Mom had strokes, HTN   Remainder reviewed in history tab.   Important Outpatient Medications: Keppra 1500mg  BID Plavix 75mg  Lipitor 80mg  Zetia  Gabapentin 300mg  qhs Ativan 0.5mg  q8h Remainder reviewed in medication history.   Objective: BP (!) 156/90   Pulse 88   Temp 97.8 F (36.6 C) (Oral)   Resp 19   Wt 87.6 kg   SpO2 100%   BMI 24.80 kg/m  Exam: General: Alert, garbled speech at time with some stuttering. NAD Eyes: Conjunctival injection bilaterally. PERRLA Neck: Supple Cardiovascular: RRR without murmur Respiratory: CTAB. Normal WOB on RA. No wheezing. Gastrointestinal: Soft, tender to palpation over R sided of abdomen, non-distended. MSK: No peripheral edema. Well perfused. Derm: Warm, dry Neuro: R sided facial droop with asymmetric smile. Unable to shrug R shoulder. 1/5 strength in R UE and LE. 5/5 strength in L UE and LE. No sensation to light physical touch of R LE. Sensation intact in R UE. Psych: Cooperative.  Labs:  CBC BMET  Recent Labs  Lab 10/27/22 1953  WBC 12.2*  HGB 16.9  HCT 45.4  PLT 391   Recent Labs  Lab 10/27/22 1937  NA 131*  K  3.9  CL 88*  BUN 11  CREATININE 1.90*  GLUCOSE 136*      EKG: NSR. Prolonged QT interval   Imaging Studies Performed: CT ANGIO HEAD NECK W WO CM W PERF (CODE STROKE)  Result Date: 10/27/2022 IMPRESSION: 1. No acute large vessel occlusion or proximal stenosis. 2. Diminished flow in the left PCA, consistent with the prior infarction in that distribution. The appearance is unchanged from 06/03/2021 CTA. 3. Artifactual suggestion of an acute stroke in the left occipital pole by perfusion imaging. This area suffers from previous infarction. No true evidence of acute infarction by CT perfusion. 4. Mild atherosclerotic change at the left carotid bifurcation but no stenosis. 5. Mild atherosclerotic change in both carotid siphon regions but no stenosis greater than 30%. 6. Right PCA receives almost all of its supply from the anterior circulation. Left PCA receives supply from both the basilar tip and the posterior communicating artery.  CT HEAD CODE STROKE WO CONTRAST  Result Date: 10/27/2022 IMPRESSION: 1. No change from studies earlier today. 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 Old right thalamic stroke. 2. No hemorrhage or other acute finding.   Colletta Maryland, MD 10/27/2022, 11:33 PM PGY-1, [facility] Intern pager: [phone], text pages welcome Secure chat group [employer] [facility] [employer] Upper-Level Resident Addendum   I  have independently interviewed and examined the patient. I have discussed the above with the original author and agree with their documentation. My edits for correction/addition/clarification are included. Please see also any attending notes.   Franchot Erichsen, D.O. PGY-3, [facility] 10/28/2022 12:28 AM  FPTS Service pager: [phone] (text pages welcome through [facility])

## 2022-10-27 NOTE — Assessment & Plan Note (Addendum)
Resolved, no neuro deficits this morning. 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 -Neurology signed off -Routine neuro checks -Management of seizures per below -PT/OT eval and treat -Tylenol 650mg  q6h prn HA

## 2022-10-27 NOTE — ED Triage Notes (Signed)
Pt in from home via [facility] as code stroke - per girlfriend, LSN at 2000 last night. Per girlfriend, pt had a seizure last night, went to [facility] and was discharged this morning. Went home, but had been unable to walk since yesterday per girlfriend. Had two seizures this evening - 5pm and 6pm. Girlfriend states 2nd seizure she witnessed had long period of nystagmus and lasted 1 min. EMS called code stroke en route for slurred speech, R face droop and R sided weakness

## 2022-10-27 NOTE — ED Notes (Signed)
Patient transported to MRI 

## 2022-10-27 NOTE — Consult Note (Signed)
NEUROLOGY CONSULTATION NOTE   Date of service: October 27, 2022 Patient Name: Alejandro Perez MRN:  [id number] DOB:  [birth year] Reason for consult: "R sided weakness and seizures" Requesting Provider: No att. providers found _ _ _   _ __   _ __ _ _  __ __   _ __   __ _  History of Present Illness  Alejandro Perez is a 46 y.o. male with PMH significant for chest pain, glaucoma, HTN, LVH, prior large L MCA stroke with residual aphasia and decreased strength on the right.  Apparently, per EMS, had a seizure yesterday and was taken to [facility]. He was discharged in the afternoon today. Per EMS, did not walk since he got to [place] last night at 2000. They got called in the afternoon around 1725 for a seizure. Patient declined to come to the ED. They did see him unscrew a bottle lid with his Right hand. They got called again for another seizure, lasting 15 secs with R sided shaking. He was noted to have no movement in his RUE and RLE. He was brought in as a code stroke. At baseline, patient is able to walk with a walker per EMS. However, he is unable to move his R leg at all.  I attempted to call the significant other to get some collateral but unable to get despite multiple attempts to call the listed number.  Patient reports that he has not gotten up since Wednesday.  LKW: 2000 on 10/26/22. mRS: 3 tNKASE: not offered, outside window Thrombectomy: not offered, no obvious LVO, no mismatch. NIHSS components Score: Comment  1a Level of Conscious 0[x]  1[]  2[]  3[]      1b LOC Questions 0[x]  1[]  2[]       1c LOC Commands 0[x]  1[]  2[]       2 Best Gaze 0[x]  1[]  2[]       3 Visual 0[x]  1[]  2[]  3[]      4 Facial Palsy 0[]  1[]  2[x]  3[]      5a Motor Arm - left 0[x]  1[]  2[]  3[]  4[]  UN[]    5b Motor Arm - Right 0[]  1[]  2[]  3[]  4[x]  UN[]    6a Motor Leg - Left 0[x]  1[]  2[]  3[]  4[]  UN[]    6b Motor Leg - Right 0[]  1[]  2[]  3[]  4[x]  UN[]    7 Limb Ataxia 0[x]  1[]  2[]  3[]  UN[]     8 Sensory 0[]  1[]  2[x]  UN[]       9 Best Language 0[]  1[x]  2[]  3[]      10 Dysarthria 0[]  1[x]  2[]  UN[]      11 Extinct. and Inattention 0[x]  1[]  2[]       TOTAL: 14      ROS   Constitutional Denies weight loss, fever and chills.   HEENT Denies changes in vision and hearing.   Respiratory Denies SOB and cough.   CV Denies palpitations and CP   GI Denies abdominal pain, nausea, vomiting and diarrhea.   GU Denies dysuria and urinary frequency.   MSK Denies myalgia and joint pain.   Skin Denies rash and pruritus.   Neurological Denies headache and syncope.   Psychiatric Denies recent changes in mood. Denies anxiety and depression.    Past History   Past Medical History:  Diagnosis Date   Anemia    Chest pain    Glaucoma    Hypertension    LVH (left ventricular hypertrophy)    Spleen enlarged    Stroke (HCC)    Thrombocytopenia [facility])    Past Surgical History:  Procedure Laterality Date   BUBBLE STUDY  09/28/2020   Procedure: BUBBLE STUDY;  Surgeon: [name], MD;  Location: [facility] ENDOSCOPY;  Service: Cardiovascular;;   CHOLECYSTECTOMY     SPLENECTOMY, 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   Procedure: SPLENECTOMY;  Surgeon: [name], MD;  Location: [facility] OR;  Service: General;  Laterality: N/A;   TEE WITHOUT CARDIOVERSION N/A 09/28/2020   Procedure: TRANSESOPHAGEAL ECHOCARDIOGRAM (TEE);  Surgeon: [name], MD;  Location: [facility] ENDOSCOPY;  Service: Cardiovascular;  Laterality: N/A;   Family History  Problem Relation Age of Onset   Sickle cell trait Mother    Social History   Socioeconomic History   Marital status: Single    Spouse name: Not on file   Number of children: Not on file   Years of education: Not on file   Highest education level: Not on file  Occupational History   Not on file  Tobacco Use   Smoking status: Former    Types: Cigarettes    Quit date: 09/19/2020    Years since quitting: 2.1   Smokeless tobacco: Never  Vaping Use   Vaping Use: Never used  Substance and Sexual Activity    Alcohol use: No   Drug use: No   Sexual activity: Not on file  Other Topics Concern   Not on file  Social History Narrative   Used to do Holiday representative work   Social Determinants of Corporate investment banker Strain: Not on file  Food Insecurity: Not on file  Transportation Needs: Not on file  Physical Activity: Not on file  Stress: Not on file  Social Connections: Not on file   Allergies  Allergen Reactions   Morphine And Related Other (See Comments)    Severe abdominal pain   Fentanyl Other (See Comments)    Mania    Medications  (Not in a hospital admission)    Vitals   Vitals:   10/27/22 1900  Weight: 87.6 kg     Body mass index is 24.8 kg/m.  Physical Exam   General: Laying comfortably in bed; in no acute distress.  HENT: Normal oropharynx and mucosa. Normal external appearance of ears and nose.  Neck: Supple, no pain or tenderness  CV: No JVD. No peripheral edema.  Pulmonary: Symmetric Chest rise. Normal respiratory effort.  Abdomen: Soft to touch, non-tender.  Ext: No cyanosis, edema, or deformity  Skin: No rash. Normal palpation of skin.   Musculoskeletal: Normal digits and nails by inspection. No clubbing.   Neurologic Examination  Mental status/Cognition: Alert, oriented to self, place, month and year, good attention.  Speech/language: dysarthric speech, non fluent, makes paraphasic errors and identifies some but not all objects, comprehension intact to simple commands Cranial nerves:   CN II Pupils equal and reactive to light, no VF deficits    CN III,IV,VI EOM intact, no gaze preference or deviation, no nystagmus    CN V normal sensation in V1, V2, and V3 segments bilaterally    CN VII R facial droop   CN VIII normal hearing to speech   CN IX & X normal palatal elevation, no uvular deviation   CN XI 5/5 head turn and 5/5 shoulder shrug bilaterally   CN XII midline tongue protrusion   Motor:  Muscle bulk: normal, tone flaccid in RUE and  RLE. Mvmt Root Nerve  Muscle Right Left Comments  SA C5/6 Ax Deltoid 0 5   EF C5/6 Mc Biceps 0 5   EE C6/7/8 Rad Triceps 0  5   WF C6/7 Med FCR     WE C7/8 PIN ECU     F Ab C8/T1 U ADM/FDI 0 5   HF L1/2/3 Fem Illopsoas 0 5   KE L2/3/4 Fem Quad 0 5   DF L4/5 D Peron Tib Ant 0 5   PF S1/2 Tibial Grc/Sol 0 5    Sensation:  Light touch Absent in RUE and RLE   Pin prick    Temperature    Vibration   Proprioception    Coordination/Complex Motor:  - Finger to Nose intact on the left but not the right - Heel to shin unable to do - Rapid alternating movement intact on the left. - Gait: deferred for patient safety.  Labs   CBC: No results for input(s): "WBC", "NEUTROABS", "HGB", "HCT", "MCV", "PLT" in the last 168 hours.  Basic Metabolic Panel:  Lab Results  Component Value Date   NA 136 10/15/2020   K 4.0 10/15/2020   CO2 22 10/15/2020   GLUCOSE 88 10/15/2020   BUN 16 10/15/2020   CREATININE 1.71 (H) 10/15/2020   CALCIUM 10.1 10/15/2020   GFRNONAA 49 (L) 10/15/2020   GFRAA >60 10/17/2019   Lipid Panel:  Lab Results  Component Value Date   LDLCALC 130 (H) 09/24/2020   HgbA1c:  Lab Results  Component Value Date   HGBA1C 4.8 09/24/2020   Urine Drug Screen:     Component Value Date/Time   LABOPIA POSITIVE (A) 10/10/2020 1858   COCAINSCRNUR NONE DETECTED 10/10/2020 1858   LABBENZ NONE DETECTED 10/10/2020 1858   AMPHETMU NONE DETECTED 10/10/2020 1858   THCU POSITIVE (A) 10/10/2020 1858   LABBARB NONE DETECTED 10/10/2020 1858    Alcohol Level No results found for: "ETH"  CT Head without contrast(Personally reviewed): CTH was negative for a large hypodensity concerning for a large territory infarct or hyperdensity concerning for an ICH. Noted to have L MCA distribution encephalomalacia consistent with prior strokes.  CT angio Head and Neck with contrast(Personally reviewed): No LVO  CT Perfusion(Personally reviewed): Artifact, no obvious mismatch  MRI  Brain(Personally reviewed): Pending Impression   Torrell Krutz is a 46 y.o. male with PMH significant for chest pain, glaucoma, HTN, LVH, prior large L MCA stroke with residual aphasia and decreased strength on the right. He was seen at OSH yesterday for seizure and discharged this afternoon. Presents with 2 seizures and significant worsening of baseline R sided weakness with complete plegia on the right.  Etiology could be post ictal but seems to be persistent deficit despite improved mentation. Could possibly have had another stroke.  He was outside the window for tnkase and not a candidate for thrombectomy 2/2 no LVO.  Recommendations   Worsening R sided weakness concerning for an acute stroke: - Frequent Neuro checks per stroke unit protocol - Recommend brain imaging with MRI Brain without contrast - Recommend obtaining TTE - Recommend obtaining Lipid panel with LDL - Please start statin if LDL > 70 - Recommend HbA1c - Antithrombotic - Aspirin 81mg  daily along with plavix 75mg  daily for 21 days, followed by Aspirin 81mg  daily alone. - Recommend DVT ppx - SBP goal - permissive hypertension first 24 h < 220/110. Held home meds.  - Recommend Telemetry monitoring for arrythmia - Recommend bedside swallow screen prior to PO intake. - Stroke education booklet - Recommend PT/OT/SLP consult - Recommend Urine Tox screen.   Breakthrough Seizures x 2 today - on Keppra 1000mg  BID(max renally dosed for his Creatinine) - Will  load him up with Keppra 1500mg  IV once. - He was started on Lacosamide 50mg  BID yesteray it seems like but has not started yet. Will start Vimpat 50mg  BID here. If more seizures after starting Vimpat, will need to increase the dose. - seizure precautions with seizure pads. - no driving for 6 months. Has to be seizure free before he can resume driving.   ______________________________________________________________________   Thank you for the opportunity to take  part in the care of this patient. If you have any further questions, please contact the neurology consultation attending.  Signed,  Erick BlinksSalman Truda Staub [facility] Pager Number [phone] _ _ _   _ __   _ __ _ _  __ __   _ __   __ _

## 2022-10-27 NOTE — Assessment & Plan Note (Addendum)
Mildly hypertensive to 150s/90s upon admission. Given concern for CVA, allow for permissive HTN as above. -Hold home BP meds

## 2022-10-27 NOTE — Assessment & Plan Note (Addendum)
Seizure x 3 in the past 24 hours. Compliant on Keppra 1500mg  BID at home daily. Seen at [facility] ED yesterday and started on Vimpat 50mg  BID. Neurology consulted and recommended Keppra load and renally dosing maintenance and continuing Vimpat. -Neurology consulted -Keppra load 1500mg  IV -Keppra 1000mg  BID (renal dosing) -Vimpat 50mg  BID -Ativan 1mg  prn seizure activity -Seizure precautions

## 2022-10-27 NOTE — Assessment & Plan Note (Deleted)
Home medications include Ativan 0.5 mg as needed.  PDMP shows patient infrequently needs this and does truly use it for as needed purposes.  -Consider restarting home medication

## 2022-10-27 NOTE — ED Notes (Signed)
Girlfriend Misty Stanley [phone] would like an update asap

## 2022-10-27 NOTE — ED Provider Notes (Signed)
[facility] EMERGENCY DEPARTMENT Provider Note   CSN: [id number] Arrival date & time: 10/27/22  [id number]     History  Chief Complaint  Patient presents with   Code Stroke    Alejandro Perez is a 46 y.o. male.  HPI   This patient is a 46 year old male known to have some type of seizure disorder.  He was seen in the emergency department yesterday after suffering a seizure.  He evidently was discharged home and comes back today after having 2 more seizures today.  He had initially refused transport however after a second seizure at home he is coming by ambulance to this hospital.  The patient is not giving any history.  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  Code stroke was activated prehospital though his last seen normal seem to be 8:00 last night over 23 hours ago  Home Medications Prior to Admission medications   Medication Sig Start Date End Date Taking? Authorizing Provider  acetaminophen (TYLENOL) 325 MG tablet Take 650 mg by mouth every 6 (six) hours as needed for moderate pain or headache.    [provider]  amLODipine (NORVASC) 10 MG tablet TAKE 1 TABLET (10 MG TOTAL) BY MOUTH DAILY. 09/28/20 09/28/21  Elgergawy, Silver Huguenin, MD  atorvastatin (LIPITOR) 40 MG tablet TAKE 1 TABLET (40 MG TOTAL) BY MOUTH DAILY. 09/28/20 09/28/21  Elgergawy, Silver Huguenin, MD  carvedilol (COREG) 6.25 MG tablet Take 0.5 tablets (3.125 mg total) by mouth 2 (two) times daily with a meal. 10/13/20   Gonfa, Charlesetta Ivory, MD  cloNIDine (CATAPRES) 0.2 MG tablet TAKE 1 TABLET (0.2 MG TOTAL) BY MOUTH TWO TIMES DAILY. 09/28/20 09/28/21  Elgergawy, Silver Huguenin, MD  clopidogrel (PLAVIX) 75 MG tablet Take 1 tablet (75 mg total) by mouth daily. 10/13/20   Mercy Riding, MD  folic acid (FOLVITE) 1 MG tablet Take 1 tablet (1 mg total) by mouth daily. 12/25/17   Nita Sells, MD  furosemide (LASIX) 40 MG tablet Take 1  tablet (40 mg total) by mouth daily. 10/18/20   Mercy Riding, MD  hydrochlorothiazide (HYDRODIURIL) 12.5 MG tablet TAKE 1 TABLET (12.5 MG TOTAL) BY MOUTH DAILY. 09/28/20 09/28/21  Elgergawy, Silver Huguenin, MD  Multiple Vitamin (MULTIVITAMIN WITH MINERALS) TABS tablet Take 1 tablet by mouth daily. 10/19/19   Mikhail, Velta Addison, DO  pantoprazole (PROTONIX) 40 MG tablet TAKE 1 TABLET (40 MG TOTAL) BY MOUTH DAILY. 09/28/20 09/28/21  Elgergawy, Silver Huguenin, MD  senna-docusate (SENOKOT-S) 8.6-50 MG tablet Take 2 tablets by mouth at bedtime as needed for mild constipation or moderate constipation. Patient not taking: Reported on 10/10/2020 10/17/19   Stark Klein, MD  sucralfate (CARAFATE) 1 g tablet Take 1 tablet (1 g total) by mouth 4 (four) times daily -  with meals and at bedtime. 10/13/20   Mercy Riding, MD  vitamin B-12 1000 MCG tablet Take 1 tablet (1,000 mcg total) by mouth daily. 12/25/17   Nita Sells, MD  metoprolol tartrate (LOPRESSOR) 25 MG tablet Take 0.5 tablets (12.5 mg total) by mouth 2 (two) times daily. 09/28/20 10/15/20  Elgergawy, Silver Huguenin, MD      Allergies    Morphine and related and Fentanyl    Review of Systems   Review of Systems  Unable to perform ROS: Acuity of condition    Physical Exam Updated Vital Signs BP (!) 143/100   Pulse  83   Resp 19   Wt 87.6 kg   SpO2 100%   BMI 24.80 kg/m  Physical Exam Vitals and nursing note reviewed.  Constitutional:      General: He is not in acute distress.    Appearance: He is well-developed.  HENT:     Head: Normocephalic and atraumatic.     Mouth/Throat:     Pharynx: No oropharyngeal exudate.  Eyes:     General: No scleral icterus.       Right eye: No discharge.        Left eye: No discharge.     Conjunctiva/sclera: Conjunctivae normal.     Pupils: Pupils are equal, round, and reactive to light.  Neck:     Thyroid: No thyromegaly.     Vascular: No JVD.  Cardiovascular:     Rate and Rhythm: Normal rate and regular  rhythm.     Heart sounds: Normal heart sounds. No murmur heard.    No friction rub. No gallop.  Pulmonary:     Effort: Pulmonary effort is normal. No respiratory distress.     Breath sounds: Normal breath sounds. No wheezing or rales.  Abdominal:     General: Bowel sounds are normal. There is no distension.     Palpations: Abdomen is soft. There is no mass.     Tenderness: There is no abdominal tenderness.  Musculoskeletal:        General: No tenderness. Normal range of motion.     Cervical back: Normal range of motion and neck supple.  Lymphadenopathy:     Cervical: No cervical adenopathy.  Skin:    General: Skin is warm and dry.     Findings: No erythema or rash.  Neurological:     Mental Status: He is alert.     Coordination: Coordination normal.     Comments: Right-sided deficits present, the patient is not answering questions, he is awake and alert  Psychiatric:        Behavior: Behavior normal.     ED Results / Procedures / Treatments   Labs (all labs ordered are listed, but only abnormal results are displayed) Labs Reviewed  CBC - Abnormal; Notable for the following components:      Result Value   WBC 12.2 (*)    MCHC 37.2 (*)    All other components within normal limits  DIFFERENTIAL - Abnormal; Notable for the following components:   Neutro Abs 9.9 (*)    Monocytes Absolute 1.2 (*)    All other components within normal limits  I-STAT CHEM 8, ED - Abnormal; Notable for the following components:   Sodium 131 (*)    Chloride 88 (*)    Creatinine, Ser 1.90 (*)    Glucose, Bld 136 (*)    All other components within normal limits  CBG MONITORING, ED - Abnormal; Notable for the following components:   Glucose-Capillary 136 (*)    All other components within normal limits  COMPREHENSIVE METABOLIC PANEL  ETHANOL  PROTIME-INR  APTT  RAPID URINE DRUG SCREEN, HOSP PERFORMED    EKG None  Radiology CT ANGIO HEAD NECK W WO CM W PERF (CODE STROKE)  Result Date:  10/27/2022 CLINICAL DATA:  Worsening neurological deficit.  Right hemiparesis. EXAM: CT ANGIOGRAPHY HEAD AND NECK CT PERFUSION BRAIN TECHNIQUE: Multidetector CT imaging of the head and neck was performed using the standard protocol during bolus administration of intravenous contrast. Multiplanar CT image reconstructions and MIPs were obtained to evaluate the vascular  anatomy. Carotid stenosis measurements (when applicable) are obtained utilizing NASCET criteria, using the distal internal carotid diameter as the denominator. Multiphase CT imaging of the brain was performed following IV bolus contrast injection. Subsequent parametric perfusion maps were calculated using RAPID software. RADIATION DOSE REDUCTION: This exam was performed according to the departmental dose-optimization program which includes automated exposure control, adjustment of the mA and/or kV according to patient size and/or use of iterative reconstruction technique. CONTRAST:  30mL OMNIPAQUE IOHEXOL 350 MG/ML SOLN COMPARISON:  Head CT earlier same day. MRI and head CT earlier same day different institution. Prior CT angiography 06/03/2021 FINDINGS: CTA NECK FINDINGS Aortic arch: Branching pattern from the arch is normal. Minimal soft and calcified plaque at the innominate artery origin but no stenosis. Right carotid system: Common carotid artery widely patent to the bifurcation. Carotid bifurcation is normal. Cervical ICA is normal. Left carotid system: Common carotid artery widely patent to the bifurcation. Calcified plaque at the carotid bifurcation but no stenosis. Cervical ICA is widely patent. Vertebral arteries: Both vertebral artery origins are widely patent. Both vertebral arteries are normal through the cervical region to the foramen magnum. Skeleton: Mid cervical spondylosis.  No acute finding. Other neck: No soft tissue mass or lymphadenopathy. Lung apices are clear. Upper chest: Lung apices are clear. Review of the MIP images confirms  the above findings CTA HEAD FINDINGS Anterior circulation: Both internal carotid arteries are patent through the skull base and siphon regions. Mild siphon atherosclerotic calcification but no stenosis greater than 30%. The anterior and middle cerebral vessels are patent. No large vessel occlusion or proximal stenosis. No aneurysm or vascular malformation. Patent posterior communicating arteries on both sides. Posterior circulation: Both vertebral arteries are patent through the foramen magnum to the basilar. No basilar stenosis. Superior cerebellar and posterior cerebral arteries are patent, but there is stenotic disease of the distal right PCA branches in there is diminished flow within the left PCA, consistent with the prior infarction in that distribution. This appearance is unchanged since July of 2022. Venous sinuses: Patent and normal. Anatomic variants: Right PCA receives almost all of its supply from the anterior circulation. Left PCA receives supply from both the basilar tip and the posterior communicating artery. Review of the MIP images confirms the above findings CT Brain Perfusion Findings: CBF (<30%) Volume: Artifactual suggestion of a 14 cc region in the left occipital pole. This area artery suffers from previous infarction. Perfusion (Tmax>6.0s) volume: Artifactual suggestion of a 9 cc region in the left occipital pole. Mismatch Volume: None Infarction Location:No acute infarction. Old left occipital infarction, PCA territory. IMPRESSION: 1. No acute large vessel occlusion or proximal stenosis. 2. Diminished flow in the left PCA, consistent with the prior infarction in that distribution. The appearance is unchanged from 06/03/2021 CTA. 3. Artifactual suggestion of an acute stroke in the left occipital pole by perfusion imaging. This area suffers from previous infarction. No true evidence of acute infarction by CT perfusion. 4. Mild atherosclerotic change at the left carotid bifurcation but no  stenosis. 5. Mild atherosclerotic change in both carotid siphon regions but no stenosis greater than 30%. 6. Right PCA receives almost all of its supply from the anterior circulation. Left PCA receives supply from both the basilar tip and the posterior communicating artery. Electronically Signed   By: Nelson Chimes M.D.   On: 10/27/2022 20:33   CT HEAD CODE STROKE WO CONTRAST  Result Date: 10/27/2022 CLINICAL DATA:  Code stroke. There deficit, acute, stroke suspected. EXAM: CT HEAD  WITHOUT CONTRAST TECHNIQUE: Contiguous axial images were obtained from the base of the skull through the vertex without intravenous contrast. RADIATION DOSE REDUCTION: This exam was performed according to the departmental dose-optimization program which includes automated exposure control, adjustment of the mA and/or kV according to patient size and/or use of iterative reconstruction technique. COMPARISON:  10/14/2020 CT.  MRI earlier same day. FINDINGS: Brain: No acute CT finding. No focal abnormality seen affecting the brainstem or cerebellum. Old infarction in the right thalamus. Old infarction affecting the left posteromedial temporal lobe and occipital lobe with involvement of the left thalamus, consistent with left PCA territory stroke. No evidence of hemorrhage, hydrocephalus or extra-axial collection. Vascular: There is atherosclerotic calcification of the major vessels at the base of the brain. Skull: Negative Sinuses/Orbits: Clear/normal Other: None IMPRESSION: 1. No change from studies earlier today. 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 Old right thalamic stroke. 2. No hemorrhage or other acute finding. Electronically Signed   By: Paulina Fusi M.D.   On: 10/27/2022 19:33    Procedures Ultrasound ED Peripheral IV (Provider)  Date/Time: 10/27/2022 8:08 PM  Performed by: Eber Hong, MD Authorized by: Eber Hong, MD    Procedure details:    Indications: multiple failed IV attempts and poor IV access     Skin Prep: chlorhexidine gluconate     Location: left upper arm.   Bedside Ultrasound Guided: Yes     Images: archived     Patient tolerated procedure without complications: Yes     Dressing applied: Yes   Comments:       .Critical Care  Performed by: Eber Hong, MD Authorized by: Eber Hong, MD   Critical care provider statement:    Critical care time (minutes):  45   Critical care time was exclusive of:  Separately billable procedures and treating other patients and teaching time   Critical care was necessary to treat or prevent imminent or life-threatening deterioration of the following conditions:  CNS failure or compromise   Critical care was time spent personally by me on the following activities:  Development of treatment plan with patient or surrogate, discussions with consultants, evaluation of patient's response to treatment, examination of patient, obtaining history from patient or surrogate, review of old charts, re-evaluation of patient's condition, pulse oximetry, ordering and review of radiographic studies, ordering and review of laboratory studies and ordering and performing treatments and interventions   I assumed direction of critical care for this patient from another provider in my specialty: no     Care discussed with: admitting provider   Comments:           Medications Ordered in ED Medications  sodium chloride flush (NS) 0.9 % injection 3 mL (has no administration in time range)  levETIRAcetam (KEPPRA) IVPB 1500 mg/ 100 mL premix (has no administration in time range)  lacosamide (VIMPAT) tablet 50 mg (has no administration in time range)    Or  lacosamide (VIMPAT) 50 mg in sodium chloride 0.9 % 25 mL IVPB (has no administration in time range)  levETIRAcetam (KEPPRA) tablet 1,000 mg (has no administration in time range)    Or  levETIRAcetam (KEPPRA) IVPB 1000  mg/100 mL premix (has no administration in time range)  iohexol (OMNIPAQUE) 350 MG/ML injection 75 mL (75 mLs Intravenous Contrast Given 10/27/22 2011)    ED Course/ Medical Decision Making/ A&P Clinical Course as of 10/27/22 2122  Fri Oct 27, 2022  2037 ED EKG [BM]    Clinical Course User Index [BM] Noemi Chapel, MD                           Medical Decision Making Amount and/or Complexity of Data Reviewed Labs: ordered. Radiology: ordered. ECG/medicine tests:  Decision-making details documented in ED Course.  Risk Decision regarding hospitalization.   I was asked by the neurohospitalist and nursing to place an 18-gauge IV in the patient's left upper extremity to do a perfusion scan of the brain.  This was done in the CT scanner.  This was done successfully however it took 3 attempts as he has very poor IV access.  This patient presents to the ED for concern of altered mental status, right-sided hemiparesis, multiple seizures, this involves an extensive number of treatment options, and is a complaint that carries with it a high risk of complications and morbidity.  The differential diagnosis includes stroke, status epilepticus, seizure, tumor, hemorrhage   Co morbidities that complicate the patient evaluation  The patient is unable to answer any questions History of seizure and stroke   Additional history obtained:  Additional history obtained from electronic medical record External records from outside source obtained and reviewed including prior admission notes from November 2021, noted that at that time he had polysubstance abuse, history of a splenectomy   Lab Tests:  I Ordered, and personally interpreted labs.  The pertinent results include: CBC metabolic panel, mild leukocytosis, no anemia, metabolic panel is overall unremarkable, creatinine seems to be close to baseline, glucose is normal   Imaging Studies ordered:  I ordered imaging studies including  I  independently visualized and interpreted imaging which showed no acute hemorrhage, no obvious perfusion deficits I agree with the radiologist interpretation   Cardiac Monitoring: / EKG:  The patient was maintained on a cardiac monitor.  I personally viewed and interpreted the cardiac monitored which showed an underlying rhythm of: Sinus rhythm   Consultations Obtained:  I requested consultation with the neurology,  and discussed lab and imaging findings as well as pertinent plan - they recommend: Inpatient admission, discussed with family practice, they will admit   Problem List / ED Course / Critical interventions / Medication management  Patient presents critically ill with acute hemiparesis which seems to be worse than baseline, he went through the acute stroke workup, had negative workup but has had ultimately multiple seizures throughout the day I ordered medication including Keppra for seizures Reevaluation of the patient after these medicines showed that the patient still very symptomatic but slightly improved I have reviewed the patients home medicines and have made adjustments as needed   Social Determinants of Health:  Seizures, stroke, substance abuse   Test / Admission - Considered:  Will admit to the hospital to higher level of care         Final Clinical Impression(s) / ED Diagnoses Final diagnoses:  Seizure ([place])  Hemiparesis, unspecified hemiparesis etiology, unspecified laterality [facility])    Rx / DC Orders ED Discharge Orders     None         Noemi Chapel, MD 10/27/22 2122

## 2022-10-27 NOTE — ED Notes (Signed)
Pt had episode of possible seizure activity, witnessed by one of the RN staff. Reportedly, pt hit call light, and lots of movement/artifact picked up on cardiac monitor. When nurse entered room, pt appeared to have seizure-like activity for less than 30 seconds witnessed. It appears urine may have saturated the sheets as well. 1mg  of Ativan ordered by EDP who was called to the room. On this RN's arrival to room with medication, pt is reporting headache and anxiety. Alert to self, place at this time.

## 2022-10-27 NOTE — Assessment & Plan Note (Signed)
Cr 1.93>2.42>1.85 (baseline Cr ~1.7).  I am unsure why this precipitous bump occurred unless it is related to initiation of medications which need to be renally dosed in combination with likely prerenal causes. And no urine output charted, however patient has only been recently admitted, which further emphasizes potential prerenal causes but will further consider obstruction.  However it is resolved back to slightly above baseline upon recheck. -Renally dosed medications -Avoid nephrotoxic agents -Strict I&O

## 2022-10-28 ENCOUNTER — Observation Stay (HOSPITAL_COMMUNITY): Payer: Medicaid Other

## 2022-10-28 ENCOUNTER — Encounter (HOSPITAL_COMMUNITY): Payer: Self-pay | Admitting: Family Medicine

## 2022-10-28 ENCOUNTER — Other Ambulatory Visit: Payer: Self-pay

## 2022-10-28 DIAGNOSIS — D696 Thrombocytopenia, unspecified: Secondary | ICD-10-CM | POA: Diagnosis present

## 2022-10-28 DIAGNOSIS — F1210 Cannabis abuse, uncomplicated: Secondary | ICD-10-CM | POA: Diagnosis present

## 2022-10-28 DIAGNOSIS — E876 Hypokalemia: Secondary | ICD-10-CM | POA: Insufficient documentation

## 2022-10-28 DIAGNOSIS — I10 Essential (primary) hypertension: Secondary | ICD-10-CM | POA: Diagnosis not present

## 2022-10-28 DIAGNOSIS — G8191 Hemiplegia, unspecified affecting right dominant side: Secondary | ICD-10-CM | POA: Diagnosis present

## 2022-10-28 DIAGNOSIS — G8190 Hemiplegia, unspecified affecting unspecified side: Secondary | ICD-10-CM | POA: Diagnosis not present

## 2022-10-28 DIAGNOSIS — F1410 Cocaine abuse, uncomplicated: Secondary | ICD-10-CM | POA: Diagnosis present

## 2022-10-28 DIAGNOSIS — I130 Hypertensive heart and chronic kidney disease with heart failure and stage 1 through stage 4 chronic kidney disease, or unspecified chronic kidney disease: Secondary | ICD-10-CM | POA: Diagnosis present

## 2022-10-28 DIAGNOSIS — J45909 Unspecified asthma, uncomplicated: Secondary | ICD-10-CM | POA: Diagnosis present

## 2022-10-28 DIAGNOSIS — N183 Chronic kidney disease, stage 3 unspecified: Secondary | ICD-10-CM | POA: Diagnosis present

## 2022-10-28 DIAGNOSIS — R002 Palpitations: Secondary | ICD-10-CM | POA: Diagnosis present

## 2022-10-28 DIAGNOSIS — I69320 Aphasia following cerebral infarction: Secondary | ICD-10-CM | POA: Diagnosis not present

## 2022-10-28 DIAGNOSIS — H409 Unspecified glaucoma: Secondary | ICD-10-CM | POA: Diagnosis present

## 2022-10-28 DIAGNOSIS — N179 Acute kidney failure, unspecified: Secondary | ICD-10-CM | POA: Diagnosis present

## 2022-10-28 DIAGNOSIS — D580 Hereditary spherocytosis: Secondary | ICD-10-CM | POA: Diagnosis present

## 2022-10-28 DIAGNOSIS — R079 Chest pain, unspecified: Secondary | ICD-10-CM | POA: Diagnosis present

## 2022-10-28 DIAGNOSIS — G40909 Epilepsy, unspecified, not intractable, without status epilepticus: Secondary | ICD-10-CM | POA: Diagnosis present

## 2022-10-28 DIAGNOSIS — F419 Anxiety disorder, unspecified: Secondary | ICD-10-CM | POA: Diagnosis present

## 2022-10-28 DIAGNOSIS — R531 Weakness: Secondary | ICD-10-CM | POA: Diagnosis not present

## 2022-10-28 DIAGNOSIS — I69351 Hemiplegia and hemiparesis following cerebral infarction affecting right dominant side: Secondary | ICD-10-CM | POA: Diagnosis not present

## 2022-10-28 DIAGNOSIS — R569 Unspecified convulsions: Secondary | ICD-10-CM | POA: Diagnosis not present

## 2022-10-28 DIAGNOSIS — K219 Gastro-esophageal reflux disease without esophagitis: Secondary | ICD-10-CM | POA: Diagnosis present

## 2022-10-28 DIAGNOSIS — E785 Hyperlipidemia, unspecified: Secondary | ICD-10-CM | POA: Diagnosis present

## 2022-10-28 DIAGNOSIS — R471 Dysarthria and anarthria: Secondary | ICD-10-CM | POA: Diagnosis present

## 2022-10-28 DIAGNOSIS — G8384 Todd's paralysis (postepileptic): Secondary | ICD-10-CM | POA: Diagnosis present

## 2022-10-28 DIAGNOSIS — G8929 Other chronic pain: Secondary | ICD-10-CM | POA: Diagnosis present

## 2022-10-28 DIAGNOSIS — H548 Legal blindness, as defined in USA: Secondary | ICD-10-CM | POA: Diagnosis present

## 2022-10-28 DIAGNOSIS — I5032 Chronic diastolic (congestive) heart failure: Secondary | ICD-10-CM | POA: Diagnosis present

## 2022-10-28 LAB — COMPREHENSIVE METABOLIC PANEL
ALT: 24 U/L (ref 0–44)
ALT: 24 U/L (ref 0–44)
AST: 56 U/L — ABNORMAL HIGH (ref 15–41)
AST: 56 U/L — ABNORMAL HIGH (ref 15–41)
Albumin: 4 g/dL (ref 3.5–5.0)
Albumin: 4.3 g/dL (ref 3.5–5.0)
Alkaline Phosphatase: 80 U/L (ref 38–126)
Alkaline Phosphatase: 83 U/L (ref 38–126)
Anion gap: 18 — ABNORMAL HIGH (ref 5–15)
Anion gap: 16 — ABNORMAL HIGH (ref 5–15)
BUN: 10 mg/dL (ref 6–20)
BUN: 9 mg/dL (ref 6–20)
CO2: 26 mmol/L (ref 22–32)
CO2: 28 mmol/L (ref 22–32)
Calcium: 10.2 mg/dL (ref 8.9–10.3)
Calcium: 9.6 mg/dL (ref 8.9–10.3)
Chloride: 90 mmol/L — ABNORMAL LOW (ref 98–111)
Chloride: 86 mmol/L — ABNORMAL LOW (ref 98–111)
Creatinine, Ser: 2.42 mg/dL — ABNORMAL HIGH (ref 0.61–1.24)
Creatinine, Ser: 1.85 mg/dL — ABNORMAL HIGH (ref 0.61–1.24)
GFR, Estimated: 32 mL/min — ABNORMAL LOW (ref >60)
GFR, Estimated: 44 mL/min — ABNORMAL LOW (ref >60)
Glucose, Bld: 120 mg/dL — ABNORMAL HIGH (ref 70–99)
Glucose, Bld: 102 mg/dL — ABNORMAL HIGH (ref 70–99)
Potassium: 2.8 mmol/L — ABNORMAL LOW (ref 3.5–5.1)
Potassium: 2.6 mmol/L — CL (ref 3.5–5.1)
Sodium: 134 mmol/L — ABNORMAL LOW (ref 135–145)
Sodium: 130 mmol/L — ABNORMAL LOW (ref 135–145)
Total Bilirubin: 1.5 mg/dL — ABNORMAL HIGH (ref 0.3–1.2)
Total Bilirubin: 1.1 mg/dL (ref 0.3–1.2)
Total Protein: 6.1 g/dL — ABNORMAL LOW (ref 6.5–8.1)
Total Protein: 7.3 g/dL (ref 6.5–8.1)

## 2022-10-28 LAB — CBC
HCT: 46.6 % (ref 39.0–52.0)
Hemoglobin: 17.2 g/dL — ABNORMAL HIGH (ref 13.0–17.0)
MCH: 32.1 pg (ref 26.0–34.0)
MCHC: 36.9 g/dL — ABNORMAL HIGH (ref 30.0–36.0)
MCV: 86.9 fL (ref 80.0–100.0)
Platelets: 400 10*3/uL (ref 150–400)
RBC: 5.36 MIL/uL (ref 4.22–5.81)
RDW: 14.6 % (ref 11.5–15.5)
WBC: 11.9 10*3/uL — ABNORMAL HIGH (ref 4.0–10.5)
nRBC: 0 % (ref 0.0–0.2)

## 2022-10-28 LAB — RAPID URINE DRUG SCREEN, HOSP PERFORMED
Amphetamines: NOT DETECTED
Barbiturates: NOT DETECTED
Benzodiazepines: POSITIVE — AB
Cocaine: NOT DETECTED
Opiates: NOT DETECTED
Tetrahydrocannabinol: POSITIVE — AB

## 2022-10-28 LAB — BASIC METABOLIC PANEL
Anion gap: 14 (ref 5–15)
BUN: 10 mg/dL (ref 6–20)
CO2: 26 mmol/L (ref 22–32)
Calcium: 9.4 mg/dL (ref 8.9–10.3)
Chloride: 92 mmol/L — ABNORMAL LOW (ref 98–111)
Creatinine, Ser: 1.78 mg/dL — ABNORMAL HIGH (ref 0.61–1.24)
GFR, Estimated: 46 mL/min — ABNORMAL LOW (ref >60)
Glucose, Bld: 120 mg/dL — ABNORMAL HIGH (ref 70–99)
Potassium: 2.9 mmol/L — ABNORMAL LOW (ref 3.5–5.1)
Sodium: 132 mmol/L — ABNORMAL LOW (ref 135–145)

## 2022-10-28 LAB — LIPID PANEL
Cholesterol: 150 mg/dL (ref 0–200)
HDL: 38 mg/dL — ABNORMAL LOW (ref >40)
LDL Cholesterol: 96 mg/dL (ref 0–99)
Total CHOL/HDL Ratio: 3.9 RATIO
Triglycerides: 80 mg/dL (ref <150)
VLDL: 16 mg/dL (ref 0–40)

## 2022-10-28 LAB — PROTIME-INR
INR: 1.1 (ref 0.8–1.2)
Prothrombin Time: 13.6 seconds (ref 11.4–15.2)

## 2022-10-28 LAB — HIV ANTIBODY (ROUTINE TESTING W REFLEX): HIV Screen 4th Generation wRfx: NONREACTIVE

## 2022-10-28 LAB — ECHOCARDIOGRAM COMPLETE BUBBLE STUDY
AR max vel: 3.15 cm2
AV Area VTI: 3.01 cm2
AV Area mean vel: 3.08 cm2
AV Mean grad: 4 mmHg
AV Peak grad: 8.1 mmHg
Ao pk vel: 1.42 m/s
Area-P 1/2: 4.39 cm2
S' Lateral: 2.5 cm

## 2022-10-28 LAB — TROPONIN I (HIGH SENSITIVITY)
Troponin I (High Sensitivity): 110 ng/L (ref <18)
Troponin I (High Sensitivity): 84 ng/L — ABNORMAL HIGH (ref <18)
Troponin I (High Sensitivity): 68 ng/L — ABNORMAL HIGH (ref <18)

## 2022-10-28 LAB — APTT: aPTT: 34 seconds (ref 24–36)

## 2022-10-28 MED ORDER — POTASSIUM CHLORIDE 20 MEQ PO PACK
40.0000 meq | PACK | Freq: Every day | ORAL | Status: DC
  Administered 2022-10-28: 40 meq via ORAL
  Filled 2022-10-28: qty 2

## 2022-10-28 MED ORDER — SODIUM CHLORIDE 0.9 % IV SOLN
100.0000 mg | Freq: Once | INTRAVENOUS | Status: AC
  Administered 2022-10-28: 100 mg via INTRAVENOUS
  Filled 2022-10-28: qty 10

## 2022-10-28 MED ORDER — LACOSAMIDE 50 MG PO TABS
100.0000 mg | ORAL_TABLET | Freq: Two times a day (BID) | ORAL | Status: DC
  Administered 2022-10-28 – 2022-10-30 (×4): 100 mg via ORAL
  Filled 2022-10-28 (×4): qty 2

## 2022-10-28 MED ORDER — POTASSIUM CHLORIDE 20 MEQ PO PACK
40.0000 meq | PACK | ORAL | Status: DC
  Administered 2022-10-28: 40 meq via ORAL
  Filled 2022-10-28: qty 2

## 2022-10-28 NOTE — Progress Notes (Signed)
OT Cancellation Note  Patient Details Name: Alejandro Perez MRN: [id number] DOB: Feb 12, 1976   Cancelled Treatment:    Reason Eval/Treat Not Completed: Patient not medically ready Pt with seizure overnight and this AM, given Ativan. Will hold OT attempts and follow up likely tomorrow 12/10  Lorre Munroe 10/28/2022, 10:51 AM

## 2022-10-28 NOTE — Progress Notes (Signed)
Daily Progress Note Intern Pager: [phone]  Patient name: Alejandro Perez Medical record number: [id number] Date of birth: 1976/02/13 Age: 46 y.o. Gender: male  Primary Care Provider: Pcp, No Consultants: Neurology Code Status: Full  Pt Overview and Major Events to Date:  10/27/2022: Admitted  Assessment and Plan: Alejandro Perez is a 46 y.o. male admitted with right-sided weakness and seizures suspect to be related to recent stroke. Pertinent PMH/PSH includes HTN, HLD, polysubstance abuse (cocaine, THC), glaucoma (legally blind), GERD, seizure disorder, CKD 3, asthma, atypical angina.  * Acute right-sided weakness Outside of interventional window.  CT head, MRI brain negative.  Right-sided hemiaplasia is impressive.  Per neurology most likely a new stroke or prolonged Todd's paralysis.  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 H/o TTP with splenectomy, some concern for underlying coagulability risk.  Passed bedside swallow and therefore does have diet orders. -Neurology consulted, appreciate recommendations -Fall precautions -Echo -Lipid panel, A1c ordered  -Start statin if LDL> 70 -Neuro checks q4h -Permissive hypertension first 24 h < 220/110 -Aspirin 81 mg daily along with Plavix 75 mg daily x 21 days, followed by aspirin 81 mg daily alone -Passed bedside swallow, start on heart healthy diet -PT/OT eval and treat -Tylenol 650mg  q6h prn HA -Continuous cardiac monitoring for arrythmia  Seizure disorder [facility]) Patient had seizure x 2 minutes this a.m., resolved after Ativan 1 mg given. Home medications include Keppra 1500 mg twice daily, and recently started Vimpat 50 mg twice daily after recent ED visit on 10/26/2022.  Status post Keppra load during admission.  -Neurology following, appreciate recommendations -Keppra 1000mg  BID (renal dosing) -Vimpat 50mg  BID -Ativan 1mg  prn seizure activity -Seizure precautions  Acute-on-chronic kidney  injury (HCC) Cr 1.93>2.42>1.85 (baseline Cr ~1.7).  I am unsure why this precipitous bump occurred unless it is related to initiation of medications which need to be renally dosed in combination with likely prerenal causes. And no urine output charted, however patient has only been recently admitted, which further emphasizes potential prerenal causes but will further consider obstruction.  However it is resolved back to slightly above baseline upon recheck. -Renally dosed medications -Avoid nephrotoxic agents -Strict I&O  Hypokalemia Precipitous drop in potassium levels since admission (3.9>2.8>2.6).  Did not receive fluids during admission, other electrolytes are not worsened.  Should stabilize with initiation of diet as patient passed swallow eval. -Repeat BMP 5 PM -Add on magnesium lab -Klor-Con 40 mEq packet x 2  Anxiety Home medications include Ativan 0.5 mg as needed.  PDMP shows patient infrequently needs this and does truly use it for as needed purposes.  -Consider restarting home medication  HTN (hypertension) Home medications include amlodipine 10 mg daily, Coreg 3.125 mg twice daily, clonidine 0.2 mg twice daily, HCTZ 12.5 mg daily.  Mildly hypertensive to 130s/90s.  Allow for permissive HTN as above. -Hold home BP meds  Chest pain Repeat EKG same as prior without ST elevation.  Troponin elevated to 110.   -Follow-up troponin    FEN/GI: Heart healthy PPx: Lovenox Dispo:Pending PT recommendations  pending clinical improvement . Barriers include continued medical workup and PT/OT evaluation.   Subjective:  Patient denies chest pain today.  He originally declined Plavix as nurse was going to give this medication given he was told to not take this 1 week ago.  I reinforced taking this medication given his new presentation.  He was accepting of that and states that he was able to eat breakfast this  morning.  Objective: Temp:  [97.8 F (36.6 C)-98.7 F (37.1 C)] 98.2 F (36.8  C) (12/09 1401) Pulse Rate:  [78-98] 90 (12/09 1401) Resp:  [17-26] 17 (12/09 1401) BP: (105-158)/(54-115) 153/99 (12/09 1401) SpO2:  [95 %-100 %] 98 % (12/09 1401) Weight:  [87.6 kg-88.1 kg] 88.1 kg (12/09 1424) Physical Exam: General: Awake, alert and oriented x 3, NAD, conversationally appropriate Cardiovascular: RRR, no murmurs auscultated Respiratory: CTAB, normal WOB Abdomen: Central scar above and below umbilicus, soft, nontender abdomen, normoactive bowel sounds Extremities: 0/5 strength in right upper and lower extremity with right-sided facial droop/asymmetric smile, cranial nerves otherwise appropriate with the exception of inability to shrug right shoulder, sensation normal, left upper and lower extremity normal  Laboratory: Most recent CBC Lab Results  Component Value Date   WBC 11.9 (H) 10/28/2022   HGB 17.2 (H) 10/28/2022   HCT 46.6 10/28/2022   MCV 86.9 10/28/2022   PLT 400 10/28/2022   Most recent BMP    Latest Ref Rng & Units 10/28/2022    2:46 PM  BMP  Glucose 70 - 99 mg/dL 631   BUN 6 - 20 mg/dL 10   Creatinine 4.97 - 1.24 mg/dL 0.26   Sodium 378 - 588 mmol/L 132   Potassium 3.5 - 5.1 mmol/L 2.9   Chloride 98 - 111 mmol/L 92   CO2 22 - 32 mmol/L 26   Calcium 8.9 - 10.3 mg/dL 9.4    UDS: Positive for benzodiazepines and THC  Imaging/Diagnostic Tests: MRI brain: 1. No acute intracranial abnormality. 2. 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 3. Underlying age-related cerebral atrophy with mild chronic small vessel ischemic disease, with a few additional remote lacunar infarcts about the left basal ganglia and bilateral thalami.  Alejandro Mattocks, DO 10/28/2022, 5:02 PM  PGY-2,  Family Medicine FPTS Intern pager: [phone], text pages welcome Secure chat group [facility] Teaching Service

## 2022-10-28 NOTE — Progress Notes (Signed)
Neurology Progress Note  Brief HPI: 46 year old patient with history of glaucoma, hypertension, seizures, LVH and prior left MCA/PCA stroke with residual aphasia and right-sided weakness presents after having 2 seizures yesterday with right-sided weakness noted afterwards.  Patient states that he has been compliant with his AEDs and has not missed any doses.  He has not had any recent illnesses or other symptoms.  He did have another seizure this morning which was terminated with lorazepam.  MRI was negative for acute stroke.  Subjective: Patient reports that he has increased right-sided weakness compared to baseline.  This started after his seizures yesterday. He had a 3rd seizure this AM.  Exam: Vitals:   10/28/22 0937 10/28/22 1043  BP: (!) 156/83   Pulse: 98   Resp: (!) 25   Temp:  98.7 F (37.1 C)  SpO2: 96%    Gen: In bed, NAD Resp: non-labored breathing, no acute distress  Neuro: Mental Status: Alert and oriented to person place time and situation, speech dysarthric with evident expressive aphasia, nonfluent patient is able to name some but not all objects.  Able to follow single step and two-step commands Cranial Nerves: Pupils equal round and reactive to light, extraocular movements intact, facial sensation symmetrical, right facial droop present (patient states this is new) hearing intact to voice, voice is dysarthric, shoulder shrug greater on left than right, tongue midline Motor: Patient is able to move left upper extremity and left lower extremity with 5 out of 5 strength, 4- out of 5 strength in right upper extremity, 2 out of 5 strength in right lower extremity Sensory: Intact to light touch throughout Coordination: Ataxia noted with finger-to-nose on left, unable to perform on right Gait: Deferred  Pertinent Labs:    Latest Ref Rng & Units 10/28/2022    6:21 AM 10/27/2022    7:53 PM 10/27/2022    7:37 PM  CBC  WBC 4.0 - 10.5 K/uL 11.9  12.2    Hemoglobin 13.0 - 17.0  g/dL 02.7  25.3  66.4   Hematocrit 39.0 - 52.0 % 46.6  45.4  46.0   Platelets 150 - 400 K/uL 400  391         Latest Ref Rng & Units 10/28/2022    6:21 AM 10/27/2022    7:53 PM 10/27/2022    7:37 PM  BMP  Glucose 70 - 99 mg/dL 403  474  259   BUN 6 - 20 mg/dL 9  10  11    Creatinine 0.61 - 1.24 mg/dL  5.63  8.75   Sodium 135 - 145 mmol/L 130  134  131   Potassium 3.5 - 5.1 mmol/L 2.6  2.8  3.9   Chloride 98 - 111 mmol/L 86  90  88   CO2 22 - 32 mmol/L 28  26    Calcium 8.9 - 10.3 mg/dL 9.6  6.43       Imaging Reviewed:  CT head: No acute abnormality, old left PCA stroke  CTA head: No LVO or proximal stenosis, diminished flow in left PCA  MRI brain: No acute abnormality, no acute infarct, chronic encephalomalacia in posterior left temporal occipital region, atrophy and small vessel ischemic disease  Assessment: 46 year old patient with history of seizures, glaucoma, hypertension, LVH and prior left MCA/PCA stroke with residual aphasia and right-sided weakness presents after having 2 seizures yesterday.  He has residual right-sided weakness after the seizures which she states is increased from his baseline.  He states that he has  not missed any doses of his AEDs and has not been ill recently.  Strength on right side has improved since exam yesterday, but according to patient he is still not at his baseline.  Given occurrence of acute seizure this morning, will place patient on LTM EEG for further evaluation.  Suspect prolonged Todd's paralysis given seizures or recrudescence of old stroke deficits in setting of seizures, as MRI is negative for acute stroke.  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 recrudescence of old stroke deficits in patient with seizures  Recommendations: 1) continue Keppra 1000 mg twice daily 2) increase Vimpat to 100 mg twice daily (give extra 100mg  now as a load) 3) lorazepam 2 to 4 mg IV for seizure activity 4) continue seizure precautions 5) patient  may not drive until he is seizure-free for 6 months 6) PT/OT 7) continue Plavix 75 mg daily for stroke prevention 8) LTM EEG  [place] , MSN, AGACNP-BC [facility] See Amion for schedule and pager information 10/28/2022 11:55 AM    Attending Neurohospitalist Addendum Patient seen and examined with APP/Resident. Agree with the history and physical as documented above. Agree with the plan as documented, which I helped formulate. I have edited the note above to reflect my full findings and recommendations. I have independently reviewed the chart, obtained history, review of systems and examined the patient.I have personally reviewed pertinent head/neck/spine imaging (CT/MRI). Please feel free to call with any questions.  -- Su Monks, MD [facility] [phone]  If 7pm- 7am, please page neurology on call as listed in [place].

## 2022-10-28 NOTE — ED Notes (Signed)
P 

## 2022-10-28 NOTE — Plan of Care (Signed)

## 2022-10-28 NOTE — Progress Notes (Signed)
PT Cancellation Note  Patient Details Name: Shahzaib Azevedo MRN: [id number] DOB: 09-25-1976   Cancelled Treatment:    Reason Eval/Treat Not Completed: Medical issues which prohibited therapy.  Having active seizures last PM and today, will reattempt when medically stable.   Ivar Drape 10/28/2022, 10:54 AM  Samul Dada, PT PhD Acute Rehab Dept. Number: [facility] [id number] and [facility] [phone]

## 2022-10-28 NOTE — Progress Notes (Signed)
FMTS Brief Progress Note  S: Patient admits to leg and back pain but states the back pain has been present for 3 months.    O: BP 125/81 (BP Location: Left Arm)   Pulse 85   Temp 98.9 F (37.2 C) (Oral)   Resp 14   Ht 6' 2.02" (1.88 m)   Wt 88.1 kg   SpO2 96%   BMI 24.93 kg/m   General: Patient lying in bed, no acute distress Cardio: Well-perfused Lungs: Breathing comfortably on room air Neuro: Cranial nerves II through XII intact with exception of 3/5 muscle strength of right upper extremity and 2/5 muscle strength of left lower extremity, sensation intact  A/P: Acute right-sided weakness  Seizure disorder Patient still has right-sided weakness which appears to be stable from when neurology saw him today.  Per neurology, this could be recrudescence of old stroke versus Todd's paralysis as patient has had multiple seizures this morning. -Continue plan per day team and neurology -LTM EEG - Orders reviewed. Labs for AM ordered, which was adjusted as needed.    Erick Alley, DO 10/28/2022, 9:43 PM PGY-2, [place] [facility] Night Resident  Please page [phone] with questions.

## 2022-10-28 NOTE — Evaluation (Signed)
Clinical/Bedside Swallow Evaluation Patient Details  Name: Alejandro Perez MRN: [id number] Date of Birth: 1976/03/25  Today's Date: 10/28/2022 Time: SLP Start Time (ACUTE ONLY): 55 SLP Stop Time (ACUTE ONLY): [id number] SLP Time Calculation (min) (ACUTE ONLY): 12 min  Past Medical History:  Past Medical History:  Diagnosis Date   Anemia    Chest pain    Glaucoma    Hypertension    LVH (left ventricular hypertrophy)    Spleen enlarged    Stroke ([place])    Thrombocytopenia (HCC)    Past Surgical History:  Past Surgical History:  Procedure Laterality Date   BUBBLE STUDY  09/28/2020   Procedure: BUBBLE STUDY;  Surgeon: Sueanne Margarita, MD;  Location: [place];  Service: Cardiovascular;;   CHOLECYSTECTOMY     SPLENECTOMY, 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   Procedure: SPLENECTOMY;  Surgeon: Coralie Keens, MD;  Location: [place];  Service: General;  Laterality: N/A;   TEE WITHOUT CARDIOVERSION N/A 09/28/2020   Procedure: TRANSESOPHAGEAL ECHOCARDIOGRAM (TEE);  Surgeon: Sueanne Margarita, MD;  Location: [facility] ENDOSCOPY;  Service: Cardiovascular;  Laterality: N/A;   HPI:  Alejandro Perez is a 46 y.o. male presenting with R sided weakness and seizures. Differential for this patient's presentation of this includes acute CVA given R sided weakness for over the past day. Seizures most likely due to underlying seizure disorder worsening secondary to acute neurologic change from CVA. 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 MRI of head 10/28/22: No acute intracranial abnormality, xxtensive chronic encephalomalacia involving the posterior left temporal occipital region, consistent with a chronic left PCA and/or  PCA-MCA territory infarct.    Assessment / Plan / Recommendation  Clinical Impression  Pts oropharyngeal swallow appears grossly within functional limits. Pt does have some mild right sided facial weakness  and mild right assymetry from prior CVAs with smiling but was able to control boluses well during clinical swallow assessment. No overt s/sx of aspiration with any POs. Pt does continue to have aphasia which appears to be at his baseline per pt interview and chart review. MRI of head this admission was without acute intracranial abnormalities. No further ST needs identified. Continue regular thin liquid diet. SLP Visit Diagnosis: Dysphagia, unspecified (R13.10)    Aspiration Risk  Mild aspiration risk    Diet Recommendation   Regular, thin liquids  Medication Administration: Whole meds with liquid    Other  Recommendations Oral Care Recommendations: Oral care BID    Recommendations for follow up therapy are one component of a multi-disciplinary discharge planning process, led by the attending physician.  Recommendations may be updated based on patient status, additional functional criteria and insurance authorization.  Follow up Recommendations No SLP follow up      Assistance Recommended at Discharge    Functional Status Assessment Patient has not had a recent decline in their functional status  Frequency and Duration            Prognosis Prognosis for Safe Diet Advancement: Good      Swallow Study   General Date of Onset: 10/27/22 HPI: Alejandro Perez is a 46 y.o. male presenting with R sided weakness and seizures. Differential for this patient's presentation of this includes acute CVA given R sided weakness for over the past day. Seizures most likely due to underlying seizure disorder worsening secondary to acute neurologic change from CVA. PMHx includes HTN, h/o CVA with residual aphasia and R sided  weakness, TTP status post splenectomy, glaucoma, CHF, tobacco use and seizure disorder. MRI of head 10/28/22: No acute intracranial abnormality, xxtensive chronic encephalomalacia involving the posterior left temporal occipital region, consistent with a chronic left PCA and/or  PCA-MCA  territory infarct. Type of Study: Bedside Swallow Evaluation Previous Swallow Assessment: none on file Diet Prior to this Study: Regular;Thin liquids Temperature Spikes Noted: No Respiratory Status: Room air History of Recent Intubation: No Behavior/Cognition: Alert;Cooperative;Pleasant mood Oral Cavity Assessment: Within Functional Limits Oral Care Completed by SLP: No Oral Cavity - Dentition: Adequate natural dentition Vision: Functional for self-feeding Self-Feeding Abilities: Needs set up (right sided hemiplegia) Patient Positioning: Upright in bed Baseline Vocal Quality: Low vocal intensity Volitional Cough: Strong Volitional Swallow: Unable to elicit    Oral/Motor/Sensory Function Overall Oral Motor/Sensory Function: Mild impairment Facial ROM: Reduced right Facial Symmetry: Abnormal symmetry right Facial Strength: Reduced right   Ice Chips Ice chips: Not tested   Thin Liquid Thin Liquid: Within functional limits Presentation: Cup;Straw    Nectar Thick Nectar Thick Liquid: Not tested   Honey Thick Honey Thick Liquid: Not tested   Puree Puree: Within functional limits   Solid     Solid:  (pt politely declined)      Alejandro Perez H. MA, CCC-SLP Acute Rehabilitation Services   10/28/2022,9:38 AM

## 2022-10-28 NOTE — ED Notes (Signed)
Patient actively had a seizure. Gave 1 of ativan patient is resting now VSS. Notified MD

## 2022-10-28 NOTE — Progress Notes (Addendum)
LTM EEG hooked up and running - no initial skin breakdown - push button tested - [facility] monitoring.  

## 2022-10-28 NOTE — Progress Notes (Incomplete)
Echocardiogram 2D Echocardiogram has been performed.  Lucendia Herrlich 10/28/2022, 12:16 PM

## 2022-10-28 NOTE — Assessment & Plan Note (Addendum)
Precipitous drop in potassium levels since admission (3.9>2.8>2.6).  Did not receive fluids during admission, other electrolytes are not worsened.  Should stabilize with initiation of diet as patient passed swallow eval. -Repeat BMP 5 PM -Add on magnesium lab -Klor-Con 40 mEq packet x 2

## 2022-10-28 NOTE — ED Notes (Signed)
Patient was found standing beside bed with wires pulled off mumbling about going home. Got patient back in bed and educated on the importance of not getting up by his self because of the fall risk and right sided deficits. Patient verbalized understanding

## 2022-10-28 NOTE — ED Notes (Signed)
ED TO INPATIENT HANDOFF REPORT  ED Nurse Name and Phone #: Joice Lofts [phone]  S Name/Age/Gender Alejandro Perez 46 y.o. male Room/Bed: 003C/003C  Code Status   Code Status: Full Code  Home/SNF/Other Home Patient oriented to: self, place, time, and situation Is this baseline? Yes   Triage Complete: Triage complete  Chief Complaint Acute right-sided weakness [R53.1]  Triage Note Pt in from home via [facility] as code stroke - per girlfriend, LSN at 2000 last night. Per girlfriend, pt had a seizure last night, went to [facility] and was discharged this morning. Went home, but had been unable to walk since yesterday per girlfriend. Had two seizures this evening - 5pm and 6pm. Girlfriend states 2nd seizure she witnessed had long period of nystagmus and lasted 1 min. EMS called code stroke en route for slurred speech, R face droop and R sided weakness    Allergies Allergies  Allergen Reactions   Morphine And Related Other (See Comments)    Severe abdominal pain   Fentanyl Other (See Comments)    Mania    Level of Care/Admitting Diagnosis ED Disposition     ED Disposition  Admit   Condition  --   Comment  Hospital Area: [facility] [[id number]]  Level of Care: Telemetry Medical [104]  May place patient in observation at [facility] or [facility] if equivalent level of care is available:: No  Covid Evaluation: Asymptomatic - no recent exposure (last 10 days) testing not required  Diagnosis: Acute right-sided weakness [454098]  Admitting Physician: Latrelle Dodrill [phone]  Attending Physician: Latrelle Dodrill [phone]          B Medical/Surgery History Past Medical History:  Diagnosis Date   Anemia    Chest pain    Glaucoma    Hypertension    LVH (left ventricular hypertrophy)    Spleen enlarged    Stroke (HCC)    Thrombocytopenia (HCC)    Past Surgical History:  Procedure Laterality Date   BUBBLE STUDY  09/28/2020   Procedure:  BUBBLE STUDY;  Surgeon: [name], MD;  Location: [facility] ENDOSCOPY;  Service: Cardiovascular;;   CHOLECYSTECTOMY     SPLENECTOMY, 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   Procedure: SPLENECTOMY;  Surgeon: [name], MD;  Location: [facility] OR;  Service: General;  Laterality: N/A;   TEE WITHOUT CARDIOVERSION N/A 09/28/2020   Procedure: TRANSESOPHAGEAL ECHOCARDIOGRAM (TEE);  Surgeon: [name], MD;  Location: [facility] ENDOSCOPY;  Service: Cardiovascular;  Laterality: N/A;     A IV Location/Drains/Wounds Patient Lines/Drains/Airways Status     Active Line/Drains/Airways     Name Placement date Placement time Site Days   Peripheral IV 10/27/22 18 G 1.88" Anterior;Left;Upper Arm 10/27/22  1958  Arm  1            Intake/Output Last 24 hours  Intake/Output Summary (Last 24 hours) at 10/28/2022 1247 Last data filed at 10/27/2022 2150 Gross per 24 hour  Intake 100 ml  Output --  Net 100 ml    Labs/Imaging Results for orders placed or performed during the hospital encounter of 10/27/22 (from the past 48 hour(s))  CBG monitoring, ED     Status: Abnormal   Collection Time: 10/27/22  7:29 PM  Result Value Ref Range   Glucose-Capillary 136 (H) 70 - 99 mg/dL    Comment: Glucose reference range applies only to samples taken after fasting for at least 8 hours.  I-stat chem 8, ED     Status: Abnormal  Collection Time: 10/27/22  7:37 PM  Result Value Ref Range   Sodium 131 (L) 135 - 145 mmol/L   Potassium 3.9 3.5 - 5.1 mmol/L   Chloride 88 (L) 98 - 111 mmol/L   BUN 11 6 - 20 mg/dL   Creatinine, Ser 1.02 (H) 0.61 - 1.24 mg/dL   Glucose, Bld 725 (H) 70 - 99 mg/dL    Comment: Glucose reference range applies only to samples taken after fasting for at least 8 hours.   Calcium, Ion 1.19 1.15 - 1.40 mmol/L   TCO2 30 22 - 32 mmol/L   Hemoglobin 15.6 13.0 - 17.0 g/dL   HCT 36.6 44.0 - 34.7 %  CBC     Status: Abnormal   Collection Time: 10/27/22  7:53 PM  Result Value Ref Range   WBC 12.2 (H) 4.0 - 10.5  K/uL   RBC 5.29 4.22 - 5.81 MIL/uL   Hemoglobin 16.9 13.0 - 17.0 g/dL   HCT 42.5 95.6 - 38.7 %   MCV 85.8 80.0 - 100.0 fL   MCH 31.9 26.0 - 34.0 pg   MCHC 37.2 (H) 30.0 - 36.0 g/dL    Comment: CORRECTED FOR COLD AGGLUTININS   RDW 14.6 11.5 - 15.5 %   Platelets 391 150 - 400 K/uL    Comment: REPEATED TO VERIFY   nRBC 0.0 0.0 - 0.2 %    Comment: Performed at [facility], [address] [place], [place] [address]  Differential     Status: Abnormal   Collection Time: 10/27/22  7:53 PM  Result Value Ref Range   Neutrophils Relative % 81 %   Neutro Abs 9.9 (H) 1.7 - 7.7 K/uL   Lymphocytes Relative 9 %   Lymphs Abs 1.1 0.7 - 4.0 K/uL   Monocytes Relative 10 %   Monocytes Absolute 1.2 (H) 0.1 - 1.0 K/uL   Eosinophils Relative 0 %   Eosinophils Absolute 0.0 0.0 - 0.5 K/uL   Basophils Relative 0 %   Basophils Absolute 0.0 0.0 - 0.1 K/uL   nRBC 0 0 /100 WBC   Abs Immature Granulocytes 0.00 0.00 - 0.07 K/uL    Comment: Performed at [facility], [address], [place], [place] [address]  Comprehensive metabolic panel     Status: Abnormal   Collection Time: 10/27/22  7:53 PM  Result Value Ref Range   Sodium 134 (L) 135 - 145 mmol/L   Potassium 2.8 (L) 3.5 - 5.1 mmol/L   Chloride 90 (L) 98 - 111 mmol/L   CO2 26 22 - 32 mmol/L   Glucose, Bld 120 (H) 70 - 99 mg/dL    Comment: Glucose reference range applies only to samples taken after fasting for at least 8 hours.   BUN 10 6 - 20 mg/dL   Creatinine, Ser 8.41 (H) 0.61 - 1.24 mg/dL   Calcium 66.0 8.9 - 63.0 mg/dL   Total Protein 6.1 (L) 6.5 - 8.1 g/dL   Albumin 4.0 3.5 - 5.0 g/dL   AST 56 (H) 15 - 41 U/L   ALT 24 0 - 44 U/L   Alkaline Phosphatase 80 38 - 126 U/L   Total Bilirubin 1.5 (H) 0.3 - 1.2 mg/dL   GFR, Estimated 32 (L) >60 mL/min    Comment: (NOTE) Calculated using the CKD-EPI Creatinine Equation (2021)    Anion gap 18 (H) 5 - 15    Comment: Performed at [facility], [address], [place], [place]  [address]  Rapid urine drug screen (hospital performed)     Status: Abnormal   Collection Time: 10/27/22 10:25 PM  Result Value Ref Range   Opiates NONE DETECTED NONE DETECTED   Cocaine NONE DETECTED NONE DETECTED   Benzodiazepines POSITIVE (A) NONE DETECTED   Amphetamines NONE DETECTED NONE DETECTED   Tetrahydrocannabinol POSITIVE (A) NONE DETECTED   Barbiturates NONE DETECTED NONE DETECTED    Comment: (NOTE) DRUG SCREEN FOR MEDICAL PURPOSES ONLY.  IF CONFIRMATION IS NEEDED FOR ANY PURPOSE, NOTIFY LAB WITHIN 5 DAYS.  LOWEST DETECTABLE LIMITS FOR URINE DRUG SCREEN Drug Class                     Cutoff (ng/mL) Amphetamine and metabolites    1000 Barbiturate and metabolites    200 Benzodiazepine                 200 Opiates and metabolites        300 Cocaine and metabolites        300 THC                            50 Performed at [facility], [address], [place], [place] [address]   Comprehensive metabolic panel     Status: Abnormal   Collection Time: 10/28/22  6:21 AM  Result Value Ref Range   Sodium 130 (L) 135 - 145 mmol/L   Potassium 2.6 (LL) 3.5 - 5.1 mmol/L    Comment: CRITICAL RESULT CALLED TO, READ BACK BY AND VERIFIED WITH A,Aide Wojnar RN  10/28/22 E,[place]ENTON   Chloride 86 (L) 98 - 111 mmol/L   CO2 28 22 - 32 mmol/L   Glucose, Bld 102 (H) 70 - 99 mg/dL    Comment: Glucose reference range applies only to samples taken after fasting for at least 8 hours.   BUN 9 6 - 20 mg/dL   Creatinine, Ser 8.29 (H) 0.61 - 1.24 mg/dL   Calcium 9.6 8.9 - 56.2 mg/dL   Total Protein 7.3 6.5 - 8.1 g/dL   Albumin 4.3 3.5 - 5.0 g/dL   AST 56 (H) 15 - 41 U/L   ALT 24 0 - 44 U/L   Alkaline Phosphatase 83 38 - 126 U/L   Total Bilirubin 1.1 0.3 - 1.2 mg/dL   GFR, Estimated 44 (L) >60 mL/min    Comment: (NOTE) Calculated using the CKD-EPI Creatinine Equation (2021)    Anion gap 16 (H) 5 - 15    Comment: Performed at [facility], [address] [place],  [place] [address]  CBC     Status: Abnormal   Collection Time: 10/28/22  6:21 AM  Result Value Ref Range   WBC 11.9 (H) 4.0 - 10.5 K/uL    Comment: WHITE COUNT CONFIRMED ON SMEAR   RBC 5.36 4.22 - 5.81 MIL/uL   Hemoglobin 17.2 (H) 13.0 - 17.0 g/dL   HCT 57.8 46.9 - 62.9 %   MCV 86.9 80.0 - 100.0 fL   MCH 32.1 26.0 - 34.0 pg   MCHC 36.9 (H) 30.0 - 36.0 g/dL    Comment: CORRECTED FOR COLD AGGLUTININS   RDW 14.6 11.5 - 15.5 %   Platelets 400 150 - 400 K/uL   nRBC 0.0 0.0 - 0.2 %    Comment: Performed at [facility], [address], [place], [place] [address]  Troponin I (High Sensitivity)  Status: Abnormal   Collection Time: 10/28/22  6:21 AM  Result Value Ref Range   Troponin I (High Sensitivity) 110 (HH) <18 ng/L    Comment: CRITICAL RESULT CALLED TO, READ BACK BY AND VERIFIED WITH A,Trigger Frasier RN @1014  10/28/22 E,BENTON (NOTE) Elevated high sensitivity troponin I (hsTnI) values and significant  changes across serial measurements may suggest ACS but many other  chronic and acute conditions are known to elevate hsTnI results.  Refer to the "Links" section for chest pain algorithms and additional  guidance. Performed at [facility], [address], [place], [place]    MR BRAIN WO CONTRAST  Result Date: 10/28/2022 CLINICAL DATA:  Initial evaluation for acute seizure, neuro deficit. EXAM: MRI HEAD WITHOUT CONTRAST TECHNIQUE: Multiplanar, multiecho pulse sequences of the brain and surrounding structures were obtained without intravenous contrast. COMPARISON:  Prior CTs from 10/27/2022 and MRI from 10/10/2020. FINDINGS: Brain: Examination degraded by motion artifact. Generalized age-related cerebral atrophy. Patchy T2/FLAIR hyperintensity involving the periventricular and deep white matter, most consistent with chronic small vessel ischemic disease. Few scatter remote lacunar infarcts present about the left basal ganglia and bilateral thalami. Extensive encephalomalacia and  gliosis involving the posterior left temporal occipital region, consistent with chronic left PCA and/or PCA/MCA infarct. Scattered chronic hemosiderin staining and/or siderosis present within the left cerebral hemisphere, consistent with prior hemorrhage. No abnormal foci of restricted diffusion to suggest acute or subacute ischemia or changes related to acute seizure. Gray-white matter differentiation otherwise maintained. No acute intracranial hemorrhage. No mass lesion, midline shift, or mass effect. Mild ex vacuo dilatation of the left lateral ventricle related to the chronic left cerebral encephalomalacia. No hydrocephalus. No extra-axial fluid collection. Pituitary gland and suprasellar region within normal limits. No other intrinsic temporal lobe abnormality. Vascular: Major intracranial vascular flow voids are maintained. Skull and upper cervical spine: Craniocervical junction normal. Bone marrow signal intensity within normal limits. No scalp soft tissue abnormality. Sinuses/Orbits: Globes orbital soft tissues demonstrate no acute finding. Mild scattered mucosal thickening noted about the ethmoidal air cells. Paranasal sinuses are otherwise clear. No significant mastoid effusion. Other: None. IMPRESSION: 1. No acute intracranial abnormality. 2. 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 3. Underlying age-related cerebral atrophy with mild chronic small vessel ischemic disease, with a few additional remote lacunar infarcts about the left basal ganglia and bilateral thalami. Electronically Signed   By: 10/12/2020 M.D.   On: 10/28/2022 00:58   CT ANGIO HEAD NECK W WO CM W PERF (CODE STROKE)  Result Date: 10/27/2022 CLINICAL DATA:  Worsening neurological deficit.  Right hemiparesis. EXAM: CT ANGIOGRAPHY HEAD AND NECK CT PERFUSION BRAIN TECHNIQUE: Multidetector CT imaging of the head  and neck was performed using the standard protocol during bolus administration of intravenous contrast. Multiplanar CT image reconstructions and MIPs were obtained to evaluate the vascular anatomy. Carotid stenosis measurements (when applicable) are obtained utilizing NASCET criteria, using the distal internal carotid diameter as the denominator. Multiphase CT imaging of the brain was performed following IV bolus contrast injection. Subsequent parametric perfusion maps were calculated using RAPID software. RADIATION DOSE REDUCTION: This exam was performed according to the departmental dose-optimization program which includes automated exposure control, adjustment of the mA and/or kV according to patient size and/or use of iterative reconstruction technique. CONTRAST:  83mL OMNIPAQUE IOHEXOL 350 MG/ML SOLN COMPARISON:  Head CT earlier same day. MRI and head CT earlier same day different institution.  Prior CT angiography 06/03/2021 FINDINGS: CTA NECK FINDINGS Aortic arch: Branching pattern from the arch is normal. Minimal soft and calcified plaque at the innominate artery origin but no stenosis. Right carotid system: Common carotid artery widely patent to the bifurcation. Carotid bifurcation is normal. Cervical ICA is normal. Left carotid system: Common carotid artery widely patent to the bifurcation. Calcified plaque at the carotid bifurcation but no stenosis. Cervical ICA is widely patent. Vertebral arteries: Both vertebral artery origins are widely patent. Both vertebral arteries are normal through the cervical region to the foramen magnum. Skeleton: Mid cervical spondylosis.  No acute finding. Other neck: No soft tissue mass or lymphadenopathy. Lung apices are clear. Upper chest: Lung apices are clear. Review of the MIP images confirms the above findings CTA HEAD FINDINGS Anterior circulation: Both internal carotid arteries are patent through the skull base and siphon regions. Mild siphon atherosclerotic  calcification but no stenosis greater than 30%. The anterior and middle cerebral vessels are patent. No large vessel occlusion or proximal stenosis. No aneurysm or vascular malformation. Patent posterior communicating arteries on both sides. Posterior circulation: Both vertebral arteries are patent through the foramen magnum to the basilar. No basilar stenosis. Superior cerebellar and posterior cerebral arteries are patent, but there is stenotic disease of the distal right PCA branches in there is diminished flow within the left PCA, consistent with the prior infarction in that distribution. This appearance is unchanged since July of 2022. Venous sinuses: Patent and normal. Anatomic variants: Right PCA receives almost all of its supply from the anterior circulation. Left PCA receives supply from both the basilar tip and the posterior communicating artery. Review of the MIP images confirms the above findings CT Brain Perfusion Findings: CBF (<30%) Volume: Artifactual suggestion of a 14 cc region in the left occipital pole. This area artery suffers from previous infarction. Perfusion (Tmax>6.0s) volume: Artifactual suggestion of a 9 cc region in the left occipital pole. Mismatch Volume: None Infarction Location:No acute infarction. Old left occipital infarction, PCA territory. IMPRESSION: 1. No acute large vessel occlusion or proximal stenosis. 2. Diminished flow in the left PCA, consistent with the prior infarction in that distribution. The appearance is unchanged from 06/03/2021 CTA. 3. Artifactual suggestion of an acute stroke in the left occipital pole by perfusion imaging. This area suffers from previous infarction. No true evidence of acute infarction by CT perfusion. 4. Mild atherosclerotic change at the left carotid bifurcation but no stenosis. 5. Mild atherosclerotic change in both carotid siphon regions but no stenosis greater than 30%. 6. Right PCA receives almost all of its supply from the anterior  circulation. Left PCA receives supply from both the basilar tip and the posterior communicating artery. Electronically Signed   By: Paulina Fusi M.D.   On: 10/27/2022 20:33   CT HEAD CODE STROKE WO CONTRAST  Result Date: 10/27/2022 CLINICAL DATA:  Code stroke. There deficit, acute, stroke suspected. EXAM: CT HEAD WITHOUT CONTRAST TECHNIQUE: Contiguous axial images were obtained from the base of the skull through the vertex without intravenous contrast. RADIATION DOSE REDUCTION: This exam was performed according to the departmental dose-optimization program which includes automated exposure control, adjustment of the mA and/or kV according to patient size and/or use of iterative reconstruction technique. COMPARISON:  10/14/2020 CT.  MRI earlier same day. FINDINGS: Brain: No acute CT finding. No focal abnormality seen affecting the brainstem or cerebellum. Old infarction in the right thalamus. Old infarction affecting the left posteromedial temporal lobe and occipital lobe with involvement of the left  thalamus, consistent with left PCA territory stroke. No evidence of hemorrhage, hydrocephalus or extra-axial collection. Vascular: There is atherosclerotic calcification of the major vessels at the base of the brain. Skull: Negative Sinuses/Orbits: Clear/normal Other: None IMPRESSION: 1. No change from studies earlier today. 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 Old right thalamic stroke. 2. No hemorrhage or other acute finding. Electronically Signed   By: Paulina FusiMark  Shogry M.D.   On: 10/27/2022 19:33    Pending Labs Unresulted Labs (From admission, onward)     Start     Ordered   10/28/22 1700  Basic metabolic panel  Once,   R        10/28/22 0807   10/28/22 0813  Magnesium  Add-on,   AD        10/28/22 0812   10/28/22 0500  Hemoglobin A1c  Tomorrow morning,   R        10/27/22 2224   10/27/22 2219  Lipid panel  Once,    URGENT        10/27/22 2220   10/27/22 2210  HIV Antibody (routine testing w rflx)  (HIV Antibody (Routine testing w reflex) panel)  Once,   R        10/27/22 2224   10/27/22 2045  Protime-INR  Once,   STAT        10/27/22 2045   10/27/22 2045  APTT  Once,   STAT        10/27/22 2045   10/27/22 1919  Ethanol  (Stroke Panel (PNL))  Once,   URGENT        10/27/22 1919            Vitals/Pain Today's Vitals   10/28/22 0631 10/28/22 0800 10/28/22 0937 10/28/22 1043  BP:  (!) 152/115 (!) 156/83   Pulse:  88 98   Resp:  (!) 21 (!) 25   Temp: 97.8 F (36.6 C)   98.7 F (37.1 C)  TempSrc: Oral   Oral  SpO2:  96% 96%   Weight:        Isolation Precautions No active isolations  Medications Medications  lacosamide (VIMPAT) tablet 50 mg (50 mg Oral Given 10/28/22 0900)  levETIRAcetam (KEPPRA) tablet 1,000 mg (1,000 mg Oral Given 10/28/22 0900)  enoxaparin (LOVENOX) injection 30 mg (30 mg Subcutaneous Given 10/28/22 0913)  clopidogrel (PLAVIX) tablet 75 mg (75 mg Oral Not Given 10/28/22 0928)  acetaminophen (TYLENOL) tablet 650 mg (650 mg Oral Given 10/28/22 0207)  atorvastatin (LIPITOR) tablet 40 mg (40 mg Oral Given 10/28/22 0900)  LORazepam (ATIVAN) injection 1 mg (1 mg Intravenous Given 10/28/22 0932)  potassium chloride (KLOR-CON) packet 40 mEq (40 mEq Oral Given 10/28/22 1044)  sodium chloride flush (NS) 0.9 % injection 3 mL (3 mLs Intravenous Given 10/27/22 2150)  iohexol (OMNIPAQUE) 350 MG/ML injection 75 mL (75 mLs Intravenous Contrast Given 10/27/22 2011)  levETIRAcetam (KEPPRA) IVPB 1500 mg/ 100 mL premix (0 mg Intravenous Stopped 10/27/22 2150)  hydrOXYzine (ATARAX) tablet 10 mg (10 mg Oral Given 10/27/22 2236)  LORazepam (ATIVAN) 2 MG/ML injection (1 mg  Given 10/27/22 2239)    Mobility walks with device High fall risk   Focused Assessments Neuro Assessment Handoff:  Swallow screen pass? Yes    NIH Stroke Scale ( + Modified Stroke Scale Criteria)  Interval:  Initial Level of Consciousness (1a.)   : Alert, keenly responsive LOC Questions (1b. )   +:  Answers both questions correctly LOC Commands (1c. )   + : Performs both tasks correctly Best Gaze (2. )  +: Normal Visual (3. )  +: No visual loss Facial Palsy (4. )    : Minor paralysis Motor Arm, Left (5a. )   +: No drift Motor Arm, Right (5b. )   +: Drift Motor Leg, Left (6a. )   +: No drift Motor Leg, Right (6b. )   +: Some effort against gravity Limb Ataxia (7. ): Present in one limb Sensory (8. )   +: Normal, no sensory loss Best Language (9. )   +: Mild-to-moderate aphasia Dysarthria (10. ): Mild-to-moderate dysarthria, patient slurs at least some words and, at worst, can be understood with some difficulty Extinction/Inattention (11.)   +: No Abnormality Modified SS Total  +: 4 Complete NIHSS TOTAL: 7 Last date known well: 10/26/22 Last time known well: 2000 Neuro Assessment:   Neuro Checks:   Initial (10/27/22 1930)  Last Documented NIHSS Modified Score: 4 (10/28/22 5784) Has TPA been given? No If patient is a Neuro Trauma and patient is going to OR before floor call report to 4N Charge nurse: [phone] or [phone]   R Recommendations: See Admitting Provider Note  Report given to:   Additional Notes:

## 2022-10-29 DIAGNOSIS — R569 Unspecified convulsions: Secondary | ICD-10-CM | POA: Diagnosis not present

## 2022-10-29 DIAGNOSIS — R531 Weakness: Secondary | ICD-10-CM | POA: Diagnosis not present

## 2022-10-29 DIAGNOSIS — G8190 Hemiplegia, unspecified affecting unspecified side: Secondary | ICD-10-CM | POA: Diagnosis not present

## 2022-10-29 LAB — BASIC METABOLIC PANEL
Anion gap: 14 (ref 5–15)
Anion gap: 13 (ref 5–15)
BUN: 9 mg/dL (ref 6–20)
BUN: 10 mg/dL (ref 6–20)
CO2: 27 mmol/L (ref 22–32)
CO2: 26 mmol/L (ref 22–32)
Calcium: 9.2 mg/dL (ref 8.9–10.3)
Calcium: 8.9 mg/dL (ref 8.9–10.3)
Chloride: 93 mmol/L — ABNORMAL LOW (ref 98–111)
Chloride: 96 mmol/L — ABNORMAL LOW (ref 98–111)
Creatinine, Ser: 1.85 mg/dL — ABNORMAL HIGH (ref 0.61–1.24)
Creatinine, Ser: 1.65 mg/dL — ABNORMAL HIGH (ref 0.61–1.24)
GFR, Estimated: 44 mL/min — ABNORMAL LOW (ref >60)
GFR, Estimated: 51 mL/min — ABNORMAL LOW (ref >60)
Glucose, Bld: 111 mg/dL — ABNORMAL HIGH (ref 70–99)
Glucose, Bld: 131 mg/dL — ABNORMAL HIGH (ref 70–99)
Potassium: 2.9 mmol/L — ABNORMAL LOW (ref 3.5–5.1)
Potassium: 3.4 mmol/L — ABNORMAL LOW (ref 3.5–5.1)
Sodium: 134 mmol/L — ABNORMAL LOW (ref 135–145)
Sodium: 135 mmol/L (ref 135–145)

## 2022-10-29 LAB — MAGNESIUM: Magnesium: 1.6 mg/dL — ABNORMAL LOW (ref 1.7–2.4)

## 2022-10-29 MED ORDER — HYDROXYZINE HCL 10 MG PO TABS
10.0000 mg | ORAL_TABLET | Freq: Three times a day (TID) | ORAL | Status: DC | PRN
  Administered 2022-10-29 (×2): 10 mg via ORAL
  Filled 2022-10-29 (×2): qty 1

## 2022-10-29 MED ORDER — ENOXAPARIN SODIUM 40 MG/0.4ML IJ SOSY
40.0000 mg | PREFILLED_SYRINGE | INTRAMUSCULAR | Status: DC
  Administered 2022-10-30: 40 mg via SUBCUTANEOUS
  Filled 2022-10-29: qty 0.4

## 2022-10-29 MED ORDER — AMLODIPINE BESYLATE 10 MG PO TABS
10.0000 mg | ORAL_TABLET | Freq: Every day | ORAL | Status: DC
  Administered 2022-10-29 – 2022-10-30 (×2): 10 mg via ORAL
  Filled 2022-10-29 (×2): qty 1

## 2022-10-29 MED ORDER — CALCIUM CARBONATE ANTACID 500 MG PO CHEW
1.0000 | CHEWABLE_TABLET | Freq: Two times a day (BID) | ORAL | Status: DC
  Administered 2022-10-29 – 2022-10-30 (×2): 200 mg via ORAL
  Filled 2022-10-29 (×2): qty 1

## 2022-10-29 MED ORDER — LIDOCAINE 5 % EX PTCH
1.0000 | MEDICATED_PATCH | CUTANEOUS | Status: DC
  Administered 2022-10-29: 1 via TRANSDERMAL
  Filled 2022-10-29: qty 1

## 2022-10-29 MED ORDER — MAGNESIUM SULFATE 2 GM/50ML IV SOLN
2.0000 g | Freq: Once | INTRAVENOUS | Status: AC
  Administered 2022-10-29: 2 g via INTRAVENOUS
  Filled 2022-10-29: qty 50

## 2022-10-29 MED ORDER — POTASSIUM CHLORIDE 20 MEQ PO PACK
40.0000 meq | PACK | ORAL | Status: AC
  Administered 2022-10-29 (×2): 40 meq via ORAL
  Filled 2022-10-29 (×2): qty 2

## 2022-10-29 MED ORDER — CARVEDILOL 6.25 MG PO TABS
6.2500 mg | ORAL_TABLET | Freq: Two times a day (BID) | ORAL | Status: DC
  Administered 2022-10-29 – 2022-10-30 (×2): 6.25 mg via ORAL
  Filled 2022-10-29 (×2): qty 1

## 2022-10-29 MED ORDER — POTASSIUM CHLORIDE 20 MEQ PO PACK: 40.0000 meq | PACK | ORAL | Status: DC

## 2022-10-29 NOTE — Plan of Care (Signed)
Patient remains on MPCU. No s/s of seizure activity this shift at time of writing.   Problem: Education: Goal: Knowledge of General Education information will improve Description: Including pain rating scale, medication(s)/side effects and non-pharmacologic comfort measures Outcome: Progressing   Problem: Health Behavior/Discharge Planning: Goal: Ability to manage health-related needs will improve Outcome: Progressing   Problem: Clinical Measurements: Goal: Ability to maintain clinical measurements within normal limits will improve Outcome: Progressing Goal: Will remain free from infection Outcome: Progressing Goal: Diagnostic test results will improve Outcome: Progressing Goal: Respiratory complications will improve Outcome: Progressing Goal: Cardiovascular complication will be avoided Outcome: Progressing   Problem: Activity: Goal: Risk for activity intolerance will decrease Outcome: Progressing   Problem: Nutrition: Goal: Adequate nutrition will be maintained Outcome: Progressing   Problem: Coping: Goal: Level of anxiety will decrease Outcome: Progressing   Problem: Elimination: Goal: Will not experience complications related to bowel motility Outcome: Progressing Goal: Will not experience complications related to urinary retention Outcome: Progressing   Problem: Pain Managment: Goal: General experience of comfort will improve Outcome: Progressing   Problem: Safety: Goal: Ability to remain free from injury will improve Outcome: Progressing   Problem: Skin Integrity: Goal: Risk for impaired skin integrity will decrease Outcome: Progressing

## 2022-10-29 NOTE — Care Management (Signed)
  Transition of Care [facility]) Screening Note   Patient Details  Name: Emmons Toth Date of Birth: 04/09/1976   Transition of Care [facility]) CM/SW Contact:    Lawerance Sabal, RN Phone Number: 10/29/2022, 3:54 PM    Transition of Care Department [facility]) has reviewed patient is following advancement through interdisciplinary progression rounds. If new patient transition needs arise, please place a TOC consult.

## 2022-10-29 NOTE — Assessment & Plan Note (Addendum)
Mg 1.6 this am. -Repleted with 2g IV Mg -Recheck Mg tomorrow am

## 2022-10-29 NOTE — Hospital Course (Addendum)
Alejandro Perez is a 46 y.o. male who presented with right sided weakness and seizure activity. PMH significant for prior large L MCA stroke, seizure disorder, HTN, HLD, polysubstance abuse (cocaine, THC), glaucoma (legally blind), CKD III, atypical angina, GERD, and hereditary spherocytosis s/p splenectomy.  Hemiparesis Presented as code stroke with complete R sided hemiparesis. Neurology consulted. CT head and MRI brain negative for acute findings. 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 By hospital day #2 his symptoms had resolved entirely. Neurology ultimately decided to manage with plavix daily.  Seizure Patient recently discharged from [facility] due to seizure activity. Had additional seizure during admission which was terminated with Ativan. Neuro recommended continuing Keppra 1000mg  BID and increasing Vimpat to 100mg  BID. EEG completed and showed potential epileptogenicity arising from left temporal region.  Electrolyte Derangements Hypokalemia to 2.6-- repleted Hypomagnesemia-- repleted.  Other chronic conditions were medically managed with home medications and formulary alternatives as necessary (HTN)  PCP Follow-up Recommendations: Patient may not drive until seizure free for 6 months Continue on Keppra 100mg  BID and Vimpat 100mg  BID for seizure management and outpatient neurology f/u Stopped patients Lasix, HCTZ and clonidine as patient was normotensive during hospital stay. Recommend rechecking BMP due to mild electrolyte derangement

## 2022-10-29 NOTE — Progress Notes (Addendum)
Daily Progress Note Intern Pager: [phone]  Patient name: Alejandro Perez Medical record number: [id number] Date of birth: 03/03/1976 Age: 46 y.o. Gender: male  Primary Care Provider: Pcp, No Consultants: Neurology Code Status: Full  Pt Overview and Major Events to Date:  12/8: Presented as code stroke, admitted  12/9: 2 minute seizure, terminated with Ativan  Assessment and Plan:  Deepak Bless is a 46 y.o. male admitted with right-sided weakness and seizure activity. Pertinent PMH/PSH includes seizure disorder, prior CVA, HTN, HLD, polysubstance abuse (cocaine, THC), glaucoma (legally blind), CKD 3, atypical angina, GERD, and hereditary spherocytosis s/p splenectomy.   * Hemiparesis (HCC) Resolved, no neuro deficits this morning. 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 -Neurology following, appreciate recommendations -Routine neuro checks -Management of seizures per below -PT/OT eval and treat -Tylenol 650mg  q6h prn HA  Seizure (HCC) No additional clinical seizure activity since yesterday am. Home medications include Keppra 1500 mg twice daily, and recently started Vimpat 50 mg twice daily.  Status post Keppra load during admission.  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 Patient at baseline this morning. Imaging without new stroke. -Neuro following, appreciate recommendations -Continue Plavix daily -No need for further permissive HTN-- will gradually resume home BP meds (see HTN below)  Hypomagnesemia Mg 1.6 this am. -Repleted with 2g IV Mg -Recheck Mg tomorrow am  Hypokalemia K 2.9, somewhat unclear etiology as it was  normal on admission. -Replete with PO potassium x2 -pm BMP  HTN (hypertension) Hypertensive to 140s/90s this am. Home medications include amlodipine 10 mg daily, Coreg 6.25 mg twice daily, clonidine 0.2 mg twice daily, HCTZ 12.5 mg daily. -Resume home coreg and amlodipine -Continue holding clonidine and HCTZ for now   FEN/GI: Heart healthy diet PPx: Lovenox Dispo:Home pending clinical improvement .   Subjective:  Patient in good spirits this morning and only complains of his chronic back pain. Would also like something for acid reflux. Reports his right sided strength and sensation have come back.  Objective: Temp:  [98 F (36.7 C)-98.9 F (37.2 C)] 98 F (36.7 C) (12/10 0406) Pulse Rate:  [84-107] 89 (12/10 1200) Resp:  [14-20] 18 (12/10 1200) BP: (125-153)/(81-101) 143/91 (12/10 1200) SpO2:  [93 %-98 %] 93 % (12/10 0923) Weight:  [88.1 kg] 88.1 kg (12/09 1424) Physical Exam: General: alert, sitting in bedside chair, NAD Cardiovascular: RRR, normal S1/S2 Respiratory: normal effort, lungs CTAB Abdomen: soft, NTND Neuro: mild aphasia and dysarthria, CN II-XII intact, 5/5 strength in all extremities, sensation intact to light touch in all extremities  Laboratory: Most recent CBC Lab Results  Component Value Date   WBC 11.9 (H) 10/28/2022   HGB 17.2 (H) 10/28/2022   HCT 46.6 10/28/2022   MCV 86.9 10/28/2022   PLT 400 10/28/2022   Most recent BMP    Latest Ref Rng & Units 10/29/2022    1:56 AM  BMP  Glucose 70 - 99 mg/dL 14/07/2022   BUN 6 - 20 mg/dL 9   Creatinine 14/07/2022 - 14/07/2022 mg/dL 14/08/2022   Sodium 009 - 2.33 mmol/L 134   Potassium 3.5 - 5.1 mmol/L 2.9   Chloride 98 - 111 mmol/L 93   CO2 22 - 32 mmol/L 27  Calcium 8.9 - 10.3 mg/dL 9.2    Magnesium 1.6   Imaging/Diagnostic Tests: ECHOCARDIOGRAM COMPLETE BUBBLE STUDY Result Date: 10/28/2022 IMPRESSIONS   1. Left ventricular ejection fraction, by estimation, is 65 to 70%. The left ventricle has normal function.  The left ventricle has no regional wall motion abnormalities. There is severe left ventricular hypertrophy of the basal-septal segment. Left ventricular diastolic parameters are consistent with Grade I diastolic dysfunction (impaired relaxation).   2. Right ventricular systolic function is normal. The right ventricular size is normal.   3. The mitral valve is normal in structure. No evidence of mitral valve regurgitation. No evidence of mitral stenosis.   4. The aortic valve is normal in structure. Aortic valve regurgitation is mild. No aortic stenosis is present. Aortic valve area, by VTI measures 3.01 cm. Aortic valve mean gradient measures 4.0 mmHg. Aortic valve Vmax measures 1.42 m/s.   5. Aortic dilatation noted. There is mild dilatation of the aortic root, measuring 42 mm.   6. The inferior vena cava is normal in size with greater than 50% respiratory variability, suggesting right atrial pressure of 3 mmHg.   7. Agitated saline contrast bubble study was negative, with no evidence of any interatrial shunt.  Conclusion(s)/Recommendation(s): No intracardiac source of embolism detected on this transthoracic study. Consider a transesophageal echocardiogram to exclude cardiac source of embolism if clinically indicated.  Electronically signed by Fransico Him MD Signature Date/Time: 10/28/2022/3:13:30 PM    Final     Alcus Dad, MD 10/29/2022, 12:48 PM  PGY-3, [place] Intern pager: [phone], text pages welcome Secure chat group [place]

## 2022-10-29 NOTE — Progress Notes (Signed)
Neurology Progress Note  Brief HPI: 46 year old patient with history of glaucoma, hypertension, seizures, LVH and prior left MCA/PCA stroke with residual aphasia and right-sided weakness presents after having 2 seizures yesterday with right-sided weakness noted afterwards.  Patient states that he has been compliant with his AEDs and has not missed any doses.  He has not had any recent illnesses or other symptoms.  He did have another seizure 12/9AM which was terminated with lorazepam.  MRI was negative for acute stroke.  Subjective: No seizures since yesterday when vimpat was increased. Patient feels R sided weakness is back to baseline  Exam: Vitals:   10/29/22 1900 10/29/22 1923  BP:    Pulse: 75 83  Resp: 17 14  Temp:    SpO2: 93% 96%   Gen: In bed, NAD Resp: non-labored breathing, no acute distress  Neuro: Mental Status: Alert and oriented to person place time and situation, speech dysarthric with evident expressive aphasia, nonfluent patient is able to name some but not all objects.  Able to follow single step and two-step commands Cranial Nerves: Pupils equal round and reactive to light, extraocular movements intact, facial sensation symmetrical, right facial droop present, hearing intact to voice, voice is dysarthric, shoulder shrug greater on left than right, tongue midline Motor: Patient is able to move left upper extremity and left lower extremity with 5 out of 5 strength, 4- out of 5 strength in right upper extremity, anti-gravity RLE Sensory: Intact to light touch throughout Coordination: Ataxia noted with finger-to-nose on left, unable to perform on right Gait: Deferred  Pertinent Labs:    Latest Ref Rng & Units 10/28/2022    6:21 AM 10/27/2022    7:53 PM 10/27/2022    7:37 PM  CBC  WBC 4.0 - 10.5 K/uL 11.9  12.2    Hemoglobin 13.0 - 17.0 g/dL 16.3  84.6  65.9   Hematocrit 39.0 - 52.0 % 46.6  45.4  46.0   Platelets 150 - 400 K/uL 400  391         Latest Ref Rng &  Units 10/29/2022    2:26 PM 10/29/2022    1:56 AM 10/28/2022    2:46 PM  BMP  Glucose 70 - 99 mg/dL 935  701  779   BUN 6 - 20 mg/dL 10  9  10    Creatinine 0.61 - 1.24 mg/dL  3.90  3.00   Sodium 135 - 145 mmol/L 135  134  132   Potassium 3.5 - 5.1 mmol/L 3.4  2.9  2.9   Chloride 98 - 111 mmol/L 96  93  92   CO2 22 - 32 mmol/L 26  27  26    Calcium 8.9 - 10.3 mg/dL 8.9  9.2  9.4      Imaging Reviewed:  CT head: No acute abnormality, old left PCA stroke  CTA head: No LVO or proximal stenosis, diminished flow in left PCA  MRI brain: No acute abnormality, no acute infarct, chronic encephalomalacia in posterior left temporal occipital region, atrophy and small vessel ischemic disease  Assessment: 46 year old patient with history of seizures, glaucoma, hypertension, LVH and prior left MCA/PCA stroke with residual aphasia and right-sided weakness presents with worsened R sided weakness in setting of 3 breakthrough seizures. MRI brain neg for acute process. 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   Recommendations: 1) continue Keppra 1000 mg twice daily 2) continue Vimpat 100 mg twice daily 3) lorazepam 2 mg IV for  seizure activity 4) continue seizure precautions 5) patient may not drive until he is seizure-free for 6 months 6) PT/OT 7) continue Plavix 75 mg daily for stroke prevention 8) D/c LTM  Neurology to sign off, please re-engage if additional neurologic concerns arise  Bing Neighbors, MD [facility] [phone]  If 7pm- 7am, please page neurology on call as listed in AMION.

## 2022-10-29 NOTE — Progress Notes (Signed)
LTM EEG discontinued - no skin breakdown at unhook.   

## 2022-10-29 NOTE — Assessment & Plan Note (Addendum)
H/o large L MCA stroke with resident aphasia and slightly decreased strength on right. Patient at baseline this morning. Imaging without new stroke. -Neuro following, appreciate recommendations -Continue Plavix daily -No need for further permissive HTN-- will gradually resume home BP meds (see HTN below)

## 2022-10-29 NOTE — Procedures (Signed)
Patient Name: Alejandro Perez  MRN: [id number]  Epilepsy Attending: Windell Norfolk  Referring Physician/Provider:  Date: 10/29/2022 Duration: Day 2: 10/29/22 at 0730 to 10/29/22 at 1039  Patient history: 46 year old patient with history of glaucoma, hypertension, seizures, LVH and prior left MCA/PCA stroke with residual aphasia and right-sided weakness presents after having 2 seizures yesterday with right-sided weakness noted afterwards . EEG to rule out additional sz  Level of alertness: Awake  AEDs during EEG study: Levetiracetam, Lacosamide   Technical aspects: This EEG study was done with scalp electrodes positioned according to the 10-20 International system of electrode placement. Electrical activity was reviewed with band pass filter of 1-70Hz , sensitivity of 7 uV/mm, display speed of 79mm/sec with a 60Hz  notched filter applied as appropriate. EEG data were recorded continuously and digitally stored.  Video monitoring was available and reviewed as appropriate.  Description: The posterior dominant rhythm consists of 9-10 Hz activity of moderate voltage (25-35 uV) seen predominantly in posterior head regions, symmetric and reactive to eye opening and eye closing. Drowsiness was characterized by attenuation of the posterior background rhythm. Sleep architecture was not seen. There is an excessive amount of 15 to 18 Hz, 2-3 uV beta activity with irregular morphology distributed symmetrically and diffusely.  EEG showed continuous left hemispheric slowing. EEG showed lateralized periodic discharges, left sided at 0.5 Hz.    ABNORMALITY - Lateralized periodic discharges left, maximal temporal region - Left hemispheric slow   IMPRESSION: This study is suggestive of cortical dysfunction arising from left hemisphere likely secondary to underlying structural abnormality This study also shows evidence of potential epileptogenicity arising from left temporal region. There were no seizures captured  during this recording.    [name]

## 2022-10-29 NOTE — Procedures (Signed)
Patient Name: Alejandro Perez  MRN: [id number]  Epilepsy Attending: Windell Norfolk  Referring Physician/Provider:  Date: 10/29/2022 Duration: 10/28/22 at 1620 to 10/29/22 at 0730  Patient history: 46 year old patient with history of glaucoma, hypertension, seizures, LVH and prior left MCA/PCA stroke with residual aphasia and right-sided weakness presents after having 2 seizures yesterday with right-sided weakness noted afterwards . EEG to rule out additional sz  Level of alertness: Awake  AEDs during EEG study: Levetiracetam, Lacosamide   Technical aspects: This EEG study was done with scalp electrodes positioned according to the 10-20 International system of electrode placement. Electrical activity was reviewed with band pass filter of 1-70Hz , sensitivity of 7 uV/mm, display speed of 52mm/sec with a 60Hz  notched filter applied as appropriate. EEG data were recorded continuously and digitally stored.  Video monitoring was available and reviewed as appropriate.  Description: The posterior dominant rhythm consists of 9-10 Hz activity of moderate voltage (25-35 uV) seen predominantly in posterior head regions, symmetric and reactive to eye opening and eye closing. Drowsiness was characterized by attenuation of the posterior background rhythm. Sleep architecture was not seen. There is an excessive amount of 15 to 18 Hz, 2-3 uV beta activity with irregular morphology distributed symmetrically and diffusely.  EEG showed continuous left hemispheric slowing. EEG showed lateralized periodic discharges, left sided at 0.5 Hz.    ABNORMALITY - Lateralized periodic discharges left, maximal temporal region - Left hemispheric slow   IMPRESSION: This study is suggestive of cortical dysfunction arising from left hemisphere likely secondary to underlying structural abnormality This study also shows evidence of potential epileptogenicity arising from left temporal region. There were no seizures captured during  this recording.    Alejandro Perez

## 2022-10-29 NOTE — Evaluation (Signed)
Physical Therapy Evaluation Patient Details Name: Alejandro Perez MRN: [id number] DOB: 02/09/1976 Today's Date: 10/29/2022  History of Present Illness  46 y.o. male presenting 10/27/22 with seizures and R sided weakness. 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 PTA patient was ambulating modified independent with use of ?quad cane, including up/down 6 steps with rails into trailer. Pt currently requires minguard assist to stand and transfer (step-pivot) with hand-held assist to simulate use of cane. Unable to progress to further ambulation due to current EEG running, however pt with fair balance in static standing and during transfer and anticipate he will be at or close to his baseline when able to progress to walking. Will follow while hospitalized to assure return to baseline. Anticipate will not need follow-up therapy on discharge.  Anticipate patient will benefit from PT to address problems listed below.     Recommendations for follow up therapy are one component of a multi-disciplinary discharge planning process, led by the attending physician.  Recommendations may be updated based on patient status, additional functional criteria and insurance authorization.  Follow Up Recommendations No PT follow up (anticipate return to baseline)      Assistance Recommended at Discharge Set up Supervision/Assistance  Patient can return home with the following  A little help with walking and/or transfers;Assistance with cooking/housework;Direct supervision/assist for medications management;Direct supervision/assist for financial management;Assist  for transportation;Help with stairs or ramp for entrance    Equipment Recommendations None recommended by PT  Recommendations for Other Services  OT consult    Functional Status Assessment Patient has had a recent decline in their functional status and demonstrates the ability to make significant improvements in function in a reasonable and predictable amount of time.     Precautions / Restrictions Precautions Precautions: Fall Precaution Comments: on EEG for PT eval      Mobility  Bed Mobility Overal bed mobility: Modified Independent             General bed mobility comments: incr time, effort (in part due to multiple lines)    Transfers Overall transfer level: Needs assistance Equipment used: 1 person hand held assist Transfers: Sit to/from Stand, Bed to chair/wheelchair/BSC Sit to Stand: Min guard   Step pivot transfers: Min guard       General transfer comment: HHA on left to simulate cane; no imbalance noted    Ambulation/Gait               General Gait Details: unable due to EEG cord short, plus telemetry lines and IV  Stairs            Wheelchair Mobility    Modified Rankin (Stroke Patients Only)       Balance Overall balance assessment: Needs assistance Sitting-balance support: No upper extremity supported, Feet supported Sitting balance-Leahy Scale: Good     Standing balance support: Single extremity supported Standing balance-Leahy Scale: Fair                               Pertinent Vitals/Pain Pain Assessment Pain Assessment: Faces Faces Pain Scale: Hurts a little bit Pain Location: chronic back pain Pain Descriptors / Indicators: Aching Pain Intervention(s): Limited  activity within patient's tolerance, Monitored during session, Repositioned    Home Living Family/patient expects to be discharged to:: Private residence Living Arrangements: Spouse/significant other Available Help at Discharge: Family Type of  Home: Mobile home Home Access: Stairs to enter Entrance Stairs-Rails: Right;Left;Can reach both Entrance Stairs-Number of Steps: 6   Home Layout: One level Home Equipment: Rollator (4 wheels);Cane - quad;Shower seat      Prior Function Prior Level of Function : Needs assist             Mobility Comments: walks modified independent with ?quad cane (he states 3 points); modified independent on steps with rails;  assist with shower transfers       Hand Dominance        Extremity/Trunk Assessment   Upper Extremity Assessment Upper Extremity Assessment: Defer to OT evaluation    Lower Extremity Assessment Lower Extremity Assessment: RLE deficits/detail RLE Deficits / Details: close to baseline; reports feeling has returned; grossly 4/5    Cervical / Trunk Assessment Cervical / Trunk Assessment: Normal  Communication   Communication: Expressive difficulties  Cognition Arousal/Alertness: Awake/alert Behavior During Therapy: WFL for tasks assessed/performed Overall Cognitive Status: Difficult to assess                                 General Comments: oriented to self, location, time, situation; able to convey his home set-up and prior functional status despite expressive difficulties        General Comments      Exercises     Assessment/Plan    PT Assessment Patient needs continued PT services  PT Problem List Decreased strength;Decreased balance;Decreased mobility;Decreased knowledge of use of DME       PT Treatment Interventions DME instruction;Gait training;Stair training;Functional mobility training;Therapeutic activities;Balance training;Neuromuscular re-education;Patient/family education    PT Goals (Current goals can be found in the Care Plan section)  Acute Rehab PT Goals Patient Stated Goal: get better PT Goal Formulation: With patient Time For Goal Achievement: 11/12/22 Potential to Achieve Goals: Good    Frequency Min 3X/week      Co-evaluation               AM-PAC PT "6 Clicks" Mobility  Outcome Measure Help needed turning from your back to your side while in a flat bed without using bedrails?: None Help needed moving from lying on your back to sitting on the side of a flat bed without using bedrails?: None Help needed moving to and from a bed to a chair (including a wheelchair)?: A Little Help needed standing up from a chair using your arms (e.g., wheelchair or bedside chair)?: A Little Help needed to walk in hospital room?: A Little Help needed climbing 3-5 steps with a railing? : A Little 6 Click Score: 20    End of Session Equipment Utilized During Treatment: Gait belt Activity Tolerance: Patient tolerated treatment well Patient left: in chair;with call bell/phone within reach;with chair alarm set;with nursing/sitter in room Nurse Communication: Mobility status PT Visit Diagnosis: Other abnormalities of gait and mobility (R26.89)    Time: 2353-6144 PT Time Calculation (min) (ACUTE ONLY): 17 min   Charges:   PT Evaluation $PT Eval Low Complexity: 1 Low           Jerolyn Center, PT Acute Rehabilitation Services  Office [phone]   Zena Amos 10/29/2022, 9:31 AM

## 2022-10-29 NOTE — Evaluation (Addendum)
Occupational Therapy Evaluation Patient Details Name: Alejandro Perez MRN: [id number] DOB: 10-Jun-1976 Today's Date: 10/29/2022   History of Present Illness 46 y.o. male presenting 10/27/22 with seizures and R sided weakness. 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 He reports his RUE weakness has improved and note 5/5 MMT but impaired coordination and some inattention that improves with verbal cues. Pt reports he lives with his girlfriend and she is his caregiver. He has some expressive difficulties with language but based on report this seems to be his baseline though no family or caregiver present to confirm. He reports baseline vision impairments with glaucoma and on assessment appears to have R sided homonymous hemianopsia that he states is not new. He is able to ambulate in room with one person hand held assist with varying levels of assist from min A to min guard. He reports his significant other will be home to assist as needed with mobility and ADLs. Given pt is close to his baseline do not recommend additional OT services at this time. Additionally he declined interest in participating in therapies outside of hospital. Will follow acutely to progress indep. Could benefit from neuro ophthalmology consult outpatient.      Recommendations for follow up therapy are one component of a multi-disciplinary discharge planning process, led by the attending physician.  Recommendations may be updated based on patient status, additional functional criteria and insurance authorization.   Follow Up  Recommendations  Other (comment) (no follow up OT, neuro opthamology to assist with vision deficits)     Assistance Recommended at Discharge Frequent or constant Supervision/Assistance  Patient can return home with the following A little help with walking and/or transfers;A little help with bathing/dressing/bathroom;Assistance with cooking/housework;Assist for transportation    Functional Status Assessment  Patient has had a recent decline in their functional status and demonstrates the ability to make significant improvements in function in a reasonable and predictable amount of time.  Equipment Recommendations  None recommended by OT    Recommendations for Other Services       Precautions / Restrictions Precautions Precautions: Fall Precaution Comments: on EEG for PT eval      Mobility Bed Mobility Overal bed mobility: Modified Independent             General bed mobility comments: use of bed features    Transfers Overall transfer level: Needs assistance Equipment used: 1 person hand held assist Transfers: Sit to/from Stand, Bed to chair/wheelchair/BSC Sit to Stand: Min assist                  Balance Overall balance assessment: Needs assistance Sitting-balance support: Feet supported, No upper extremity supported Sitting balance-Leahy Scale: Good     Standing balance support: Single extremity supported Standing balance-Leahy Scale: Fair                             ADL either performed or assessed with clinical judgement   ADL Overall ADL's : Needs assistance/impaired Eating/Feeding: Modified independent   Grooming: Supervision/safety   Upper Body Bathing: Minimal assistance   Lower Body Bathing: Minimal assistance  Upper Body Dressing : Min guard   Lower Body Dressing: Min guard   Toilet Transfer: Minimal Actuary and Hygiene: Min guard Toileting - Clothing Manipulation  Details (indicate cue type and reason): assist for pulling pants from ankle while in standing, min guard for hygiene following BM     Functional mobility during ADLs: Minimal assistance (ambulating with one hand held assist to and from bathroom)       Vision Baseline Vision/History: 3 Glaucoma Ability to See in Adequate Light: 1 Impaired Patient Visual Report: Peripheral vision impairment;Blurring of vision Vision Assessment?: Yes Ocular Range of Motion: Restricted looking up Alignment/Gaze Preference: Within Defined Limits Tracking/Visual Pursuits: Decreased smoothness of vertical tracking;Requires cues, head turns, or add eye shifts to track Visual Fields: Right homonymous hemianopsia            Pertinent Vitals/Pain Pain Assessment Pain Assessment: No/denies pain Pain Intervention(s): Repositioned, Limited activity within patient's tolerance     Hand Dominance     Extremity/Trunk Assessment Upper Extremity Assessment Upper Extremity Assessment: RUE deficits/detail RUE Sensation: decreased proprioception RUE Coordination: decreased fine motor;decreased gross motor   Lower Extremity Assessment Lower Extremity Assessment: Defer to PT evaluation RLE Deficits / Details: close to baseline; reports feeling has returned; grossly 4/5   Cervical / Trunk Assessment Cervical / Trunk Assessment: Normal   Communication Communication Communication: Expressive difficulties   Cognition Arousal/Alertness: Awake/alert Behavior During Therapy: WFL for tasks assessed/performed Overall Cognitive Status: Difficult to assess                                 General Comments: general orientation is appropriately answered, expressive language limiting more formal assessment                Home Living Family/patient expects to be discharged to:: Private residence Living Arrangements: Spouse/significant other Available Help at Discharge: Family Type of Home: Mobile  home Home Access: Stairs to enter Entrance Stairs-Number of Steps: 6 Entrance Stairs-Rails: Right;Left;Can reach both Home Layout: One level     Bathroom Shower/Tub: Tub/shower unit         Home Equipment: Rollator (4 wheels);Cane - quad;Shower seat          Prior Functioning/Environment Prior Level of Function : Needs assist  Cognitive Assist : ADLs (cognitive)           Mobility Comments: walks modified independent with ?quad cane (he states 3 points); modified independent on steps with rails;  assist with shower transfers          OT Problem List: Impaired balance (sitting and/or standing);Decreased coordination;Decreased safety awareness      OT Treatment/Interventions: Self-care/ADL training;Therapeutic activities;Patient/family education    OT Goals(Current goals can be found in the care plan section) Acute Rehab OT Goals Patient Stated Goal: Return home with significant other OT Goal Formulation: With patient Time For Goal Achievement: 11/12/22 Potential to Achieve Goals: Fair ADL Goals Pt Will Perform Lower Body Dressing: with modified independence;with adaptive equipment;sit to/from stand Pt Will Transfer to Toilet: with supervision;ambulating  OT Frequency: Min 1X/week       AM-PAC OT "6 Clicks" Daily Activity     Outcome Measure Help from another person eating meals?: None Help from another person taking care of personal grooming?: A Little Help from another person toileting, which includes using toliet, bedpan, or urinal?: A Little Help from another person bathing (including washing, rinsing, drying)?:  A Little Help from another person to put on and taking off regular upper body clothing?: A Little Help from another person to put on and taking off regular lower body clothing?: A Little 6 Click Score: 19   End of Session    Activity Tolerance: Patient tolerated treatment well Patient left: in bed;with bed alarm set;with call bell/phone within  reach  OT Visit Diagnosis: Unsteadiness on feet (R26.81);History of falling (Z91.81);Low vision, both eyes (H54.2)                Time: 6770-3403 OT Time Calculation (min): 30 min Charges:  OT General Charges $OT Visit: 1 Visit OT Evaluation $OT Eval Low Complexity: 1 Low OT Treatments $Self Care/Home Management : 8-22 mins    Jasmine Pang Bakari Nikolai, OTR/L 10/29/2022, 12:54 PM

## 2022-10-30 ENCOUNTER — Other Ambulatory Visit (HOSPITAL_COMMUNITY): Payer: Self-pay

## 2022-10-30 DIAGNOSIS — G8190 Hemiplegia, unspecified affecting unspecified side: Secondary | ICD-10-CM | POA: Diagnosis not present

## 2022-10-30 LAB — BASIC METABOLIC PANEL
Anion gap: 11 (ref 5–15)
BUN: 7 mg/dL (ref 6–20)
CO2: 27 mmol/L (ref 22–32)
Calcium: 8.8 mg/dL — ABNORMAL LOW (ref 8.9–10.3)
Chloride: 96 mmol/L — ABNORMAL LOW (ref 98–111)
Creatinine, Ser: 1.71 mg/dL — ABNORMAL HIGH (ref 0.61–1.24)
GFR, Estimated: 49 mL/min — ABNORMAL LOW (ref >60)
Glucose, Bld: 105 mg/dL — ABNORMAL HIGH (ref 70–99)
Potassium: 3.3 mmol/L — ABNORMAL LOW (ref 3.5–5.1)
Sodium: 134 mmol/L — ABNORMAL LOW (ref 135–145)

## 2022-10-30 LAB — HEMOGLOBIN A1C
Hgb A1c MFr Bld: 5.3 % (ref 4.8–5.6)
Mean Plasma Glucose: 105 mg/dL

## 2022-10-30 LAB — MAGNESIUM: Magnesium: 2.1 mg/dL (ref 1.7–2.4)

## 2022-10-30 MED ORDER — LACOSAMIDE 100 MG PO TABS: 100.0000 mg | ORAL_TABLET | Freq: Two times a day (BID) | ORAL | Status: DC

## 2022-10-30 MED ORDER — CARVEDILOL 6.25 MG PO TABS
6.2500 mg | ORAL_TABLET | Freq: Two times a day (BID) | ORAL | 0 refills | Status: AC
  Filled 2022-10-30: qty 60, 30d supply, fill #0

## 2022-10-30 MED ORDER — POTASSIUM CHLORIDE CRYS ER 20 MEQ PO TBCR
40.0000 meq | EXTENDED_RELEASE_TABLET | Freq: Once | ORAL | Status: AC
  Administered 2022-10-30: 40 meq via ORAL
  Filled 2022-10-30: qty 2

## 2022-10-30 MED ORDER — LEVETIRACETAM 1000 MG PO TABS: 1000.0000 mg | ORAL_TABLET | Freq: Two times a day (BID) | ORAL | Status: AC

## 2022-10-30 MED ORDER — LACOSAMIDE 100 MG PO TABS
100.0000 mg | ORAL_TABLET | Freq: Two times a day (BID) | ORAL | 0 refills | Status: AC
  Filled 2022-10-30: qty 60, 30d supply, fill #0

## 2022-10-30 NOTE — Discharge Instructions (Addendum)
Dear Alejandro Perez,   Thank you for letting us participate in your care! In this section, you will find a brief summary of why you were admitted to the hospital, what happened during your admission, your diagnosis/diagnoses, and recommended follow up.  You were admitted because you were experiencing right sided weakness You were diagnosed with "Todd's paralysis" which is a temporary period of weakness that occurs after a seizure You were also seen by the neurologists. They recommended increasing one of your seizure medications.  Your weakness improved and you were discharged from the hospital for meeting this goal.    POST-HOSPITAL & CARE INSTRUCTIONS Please continue to take your seizure medication every day as prescribed. Keppra 1000mg  twice per day Vimpat 100mg  twice per day Please see medications section of this packet for any medication changes. Please stop taking your Lasix, Hydrochlorothiazide and Clonidine as your blood pressure has been stable here. Please let PCP/Specialists know of any changes in medications that were made.   DOCTOR'S APPOINTMENTS & FOLLOW UP No future appointments.   Thank you for choosing [facility]! Take care and be well!  Family Medicine Teaching Service Inpatient Team [place]  [facility]  [address] [place], [address] [place] [phone]

## 2022-10-30 NOTE — Plan of Care (Signed)

## 2022-10-30 NOTE — Discharge Summary (Addendum)
Alejandro Perez  Patient name: Alejandro Perez Medical record number: [id number] Date of birth: [birth year] Age: 46 y.o. Gender: male Date of Admission: 10/27/2022  Date of Discharge: 10/30/2022 Admitting Physician: Leeanne Rio, MD  Primary Care Provider: Pcp, No Consultants: Neurology  Indication for Hospitalization: R sided hemiparesis  Discharge Diagnoses/Problem List:  Principal Problem for Admission: Todd's paralysis Other Problems addressed during stay:  Principal Problem:   Hemiparesis ([place]) Active Problems:   Seizure ([place])   CVA (cerebral vascular accident) ([place])   HTN (hypertension)   Hypokalemia   Hypomagnesemia  Brief Hospital Course:  Alejandro Perez is a 46 y.o. male who presented with right sided weakness and seizure activity. PMH significant for prior large L MCA stroke, seizure disorder, HTN, HLD, polysubstance abuse (cocaine, THC), glaucoma (legally blind), CKD III, atypical angina, GERD, and hereditary spherocytosis s/p splenectomy.  Hemiparesis Presented as code stroke with complete R sided hemiparesis. Neurology consulted. CT head and MRI brain negative for acute findings. 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 By hospital day #2 his symptoms had resolved entirely. Neurology ultimately decided to manage with plavix daily.  Seizure Patient recently discharged from [facility] due to seizure activity. Had additional seizure during admission which was terminated with Ativan. Neuro recommended continuing Keppra 1000mg  BID and increasing Vimpat to 100mg  BID. EEG completed and showed potential epileptogenicity arising from left temporal region.  Electrolyte Derangements Hypokalemia to 2.6-- repleted Hypomagnesemia-- repleted.  Other chronic conditions were medically managed with home medications and formulary alternatives as necessary (HTN)  PCP Follow-up  Recommendations: Patient may not drive until seizure free for 6 months Continue on Keppra 100mg  BID and Vimpat 100mg  BID for seizure management and outpatient neurology f/u Stopped patients Lasix, HCTZ and clonidine as patient was normotensive during hospital stay. Recommend rechecking BMP due to mild electrolyte derangement  Disposition: Home  Discharge Condition: Stable   Discharge Exam:  Vitals:   10/30/22 0802 10/30/22 1153  BP: (!) 145/91 133/80  Pulse: 82 81  Resp: 20 17  Temp: 98.7 F (37.1 C) 97.7 F (36.5 C)  SpO2:     General: Well-appearing. Alert. NAD CV: RRR without murmur Pulm: CTAB. Normal WOB on RA. No wheezing Abdomen: Soft, non-tender, non-distended. +BS Ext: Well perfused. Cap refill < 3 seconds Neuro: 4/5 grip R UE and 5/5 grip L UE. 4/5 hip flexion in R LE and 5/5 hip flexion L LE. Sensation intact globally. CN intact.  Significant Procedures: None  Significant Labs and Imaging:  No results for input(s): "WBC", "HGB", "HCT", "PLT" in the last 48 hours. Recent Labs  Lab 10/28/22 1446 10/29/22 0156 10/29/22 1426 10/30/22 0333  NA 132* 134* 135 134*  K 2.9* 2.9* 3.4* 3.3*  CL 92* 93* 96* 96*  CO2 26 27 26 27   GLUCOSE 120* 111* 131* 105*  BUN 10 9 10 7   CREATININE 1.78* 1.85* 1.65* 1.71*  CALCIUM 9.4 9.2 8.9 8.8*  MG  --  1.6*  --  2.1    Pertinent Imaging: Overnight EEG with video  Result Date: 10/29/2022 ABNORMALITY - Lateralized periodic discharges left, maximal temporal region - Left hemispheric slow IMPRESSION: This study is suggestive of cortical dysfunction arising from left hemisphere likely secondary to underlying structural abnormality This study also shows evidence of potential epileptogenicity arising from left temporal region. There were no seizures captured during this recording. Alejandro Perez   ECHOCARDIOGRAM COMPLETE BUBBLE STUDY  Result Date: 10/28/2022 IMPRESSIONS  1. Left ventricular ejection fraction, by estimation, is 65  to 70%. The left ventricle has normal function. The left ventricle has no regional wall motion abnormalities. There is severe left ventricular hypertrophy of the basal-septal segment. Left ventricular diastolic parameters are consistent with Grade I diastolic dysfunction (impaired relaxation).  2. Right ventricular systolic function is normal. The right ventricular size is normal.  3. The mitral valve is normal in structure. No evidence of mitral valve regurgitation. No evidence of mitral stenosis.  4. The aortic valve is normal in structure. Aortic valve regurgitation is mild. No aortic stenosis is present. Aortic valve area, by VTI measures 3.01 cm. Aortic valve mean gradient measures 4.0 mmHg. Aortic valve Vmax measures 1.42 m/s.  5. Aortic dilatation noted. There is mild dilatation of the aortic root, measuring 42 mm.  6. The inferior vena cava is normal in size with greater than 50% respiratory variability, suggesting right atrial pressure of 3 mmHg.  7. Agitated saline contrast bubble study was negative, with no evidence of any interatrial shunt. Conclusion(s)/Recommendation(s): No intracardiac source of embolism detected on this transthoracic study. Consider a transesophageal echocardiogram to exclude cardiac source of embolism if clinically indicated.   MR BRAIN WO CONTRAST  Result Date: 10/28/2022 IMPRESSION: 1. No acute intracranial abnormality. 2. 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 3. Underlying age-related cerebral atrophy with mild chronic small vessel ischemic disease, with a few additional remote lacunar infarcts about the left basal ganglia and bilateral thalami.   CT ANGIO HEAD NECK W WO CM W PERF (CODE STROKE)  Result Date: 10/27/2022 IMPRESSION: 1. No acute large vessel occlusion or proximal stenosis. 2. Diminished flow in the left PCA, consistent with the prior  infarction in that distribution. The appearance is unchanged from 06/03/2021 CTA. 3. Artifactual suggestion of an acute stroke in the left occipital pole by perfusion imaging. This area suffers from previous infarction. No true evidence of acute infarction by CT perfusion. 4. Mild atherosclerotic change at the left carotid bifurcation but no stenosis. 5. Mild atherosclerotic change in both carotid siphon regions but no stenosis greater than 30%. 6. Right PCA receives almost all of its supply from the anterior circulation. Left PCA receives supply from both the basilar tip and the posterior communicating artery.  CT HEAD CODE STROKE WO CONTRAST  Result Date: 10/27/2022 IMPRESSION: 1. No change from studies earlier today. 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 Old right thalamic stroke. 2. No hemorrhage or other acute finding.  Results/Tests Pending at Time of Discharge: None  Discharge Medications:  Allergies as of 10/30/2022       Reactions   Morphine And Related Other (See Comments)   Severe abdominal pain   Fentanyl Other (See Comments)   Mania        Medication List     STOP taking these medications    cloNIDine 0.2 MG tablet Commonly known as: CATAPRES   furosemide 40 MG tablet Commonly known as: LASIX   hydrochlorothiazide 12.5 MG tablet Commonly known as: HYDRODIURIL       TAKE these medications    amLODipine 10 MG tablet Commonly known as: NORVASC TAKE 1 TABLET (10 MG TOTAL) BY MOUTH DAILY. What changed: how much to take   atorvastatin 40 MG tablet Commonly known as: LIPITOR TAKE 1 TABLET (40 MG TOTAL) BY MOUTH DAILY.   carvedilol  6.25 MG tablet Commonly known as: COREG Take 1 tablet (6.25 mg total) by mouth 2 (two) times daily with a meal.   clopidogrel 75 MG tablet Commonly known as: PLAVIX Take 1 tablet (75 mg total) by mouth daily.   cyanocobalamin 1000 MCG  tablet Take 1 tablet (1,000 mcg total) by mouth daily.   ezetimibe 10 MG tablet Commonly known as: ZETIA Take 10 mg by mouth daily.   folic acid 1 MG tablet Commonly known as: FOLVITE Take 1 tablet (1 mg total) by mouth daily.   hydrOXYzine 50 MG tablet Commonly known as: ATARAX Take 50 mg by mouth daily as needed for anxiety.   ibuprofen 200 MG tablet Commonly known as: ADVIL Take 400-600 mg by mouth every 6 (six) hours as needed for moderate pain.   Lacosamide 100 MG Tabs Take 1 tablet (100 mg total) by mouth 2 (two) times daily.   levETIRAcetam 1000 MG tablet Commonly known as: KEPPRA Take 1 tablet (1,000 mg total) by mouth 2 (two) times daily. What changed:  medication strength how much to take   multivitamin with minerals Tabs tablet Take 1 tablet by mouth daily.   pantoprazole 40 MG tablet Commonly known as: PROTONIX TAKE 1 TABLET (40 MG TOTAL) BY MOUTH DAILY.   potassium chloride 10 MEQ tablet Commonly known as: KLOR-CON Take 10 mEq by mouth daily.   senna-docusate 8.6-50 MG tablet Commonly known as: Senokot-S Take 2 tablets by mouth at bedtime as needed for mild constipation or moderate constipation.   sucralfate 1 g tablet Commonly known as: CARAFATE Take 1 tablet (1 g total) by mouth 4 (four) times daily -  with meals and at bedtime. What changed:  when to take this reasons to take this       Discharge Instructions: Please refer to Patient Instructions section of EMR for full details.  Patient was counseled important signs and symptoms that should prompt return to medical care, changes in medications, dietary instructions, activity restrictions, and follow up appointments.   Follow-Up Appointments: Neurology tomorrow per patient and girlfriend  Elberta Fortis, MD 10/30/2022, 12:43 PM PGY-1, [facility]   I was personally present and performed or re-performed the history, physical exam and medical decision making activities of  this service and have verified that the service and findings are accurately documented in the intern's note.  [name], DO                  10/30/2022, 12:51 PM

## 2022-10-30 NOTE — Progress Notes (Signed)
Physical Therapy Treatment Patient Details Name: Alejandro Perez MRN: [id number] DOB: October 21, 1976 Today's Date: 10/30/2022   History of Present Illness 46 y.o. male presenting 10/27/22 with seizures and R sided weakness. 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  Able to negotiate steps with rail and minguard A.  States wife will assist at d/c.  Reports for d/c home today.  Anticipate continued improvement and has assistance.  Agree no follow up PT needs at d/c.  Recommendations for follow up therapy are one component of a multi-disciplinary discharge planning process, led by the attending physician.  Recommendations may be updated based on patient status, additional functional criteria and insurance authorization.  Follow Up Recommendations  No PT follow up     Assistance Recommended at Discharge Set up Supervision/Assistance  Patient can return home with the following A little help with walking and/or transfers;Assistance with cooking/housework;Direct supervision/assist for medications management;Direct supervision/assist for financial management;Assist for transportation;Help with stairs or ramp for entrance   Equipment Recommendations  None recommended by PT    Recommendations for Other Services       Precautions / Restrictions Precautions Precautions: Fall Precaution Comments: R hemiparesis     Mobility  Bed Mobility Overal bed mobility: Modified Independent                  Transfers Overall transfer level: Needs assistance Equipment used: 1 person hand held assist, Quad cane,  Rolling walker (2 wheels) Transfers: Sit to/from Stand Sit to Stand: Min assist, Supervision           General transfer comment: up to stand initially HHA on R and QC on L, then with RW and S    Ambulation/Gait Ambulation/Gait assistance: Min assist, Supervision, Min guard Gait Distance (Feet): 160 Feet (&220) Assistive device: Quad cane, Rolling walker (2 wheels) Gait Pattern/deviations: Step-through pattern       General Gait Details: poor management with QC started on L and tripped over cane, then on R stepping past cane and dragging it back to right beside him with min A needed for safety/balance; with RW able to ambulate with minguard to S without LOB   Stairs Stairs: Yes Stairs assistance: Min guard Stair Management: Alternating pattern, Step to pattern, Two rails, One rail Left Number of Stairs: 6 General stair comments: self selected sequence ascending with step to and step throught and descending with step to sequence with R hand not maintaining holding rail.   Wheelchair Mobility    Modified Rankin (Stroke Patients Only)       Balance Overall balance assessment: Needs assistance   Sitting balance-Leahy Scale: Good     Standing balance support: Single extremity supported Standing balance-Leahy Scale: Fair                              Cognition Arousal/Alertness: Awake/alert Behavior During Therapy: WFL for tasks assessed/performed Overall Cognitive Status: History of cognitive impairments - at baseline  Exercises      General Comments General comments (skin integrity, edema, etc.): Educated to use RW at least initially till back to baseline.      Pertinent Vitals/Pain Pain Assessment Pain Assessment: No/denies pain    Home Living                          Prior Function            PT Goals (current goals can now be found in the care plan section) Progress  towards PT goals: Progressing toward goals    Frequency    Min 3X/week      PT Plan Current plan remains appropriate    Co-evaluation              AM-PAC PT "6 Clicks" Mobility   Outcome Measure  Help needed turning from your back to your side while in a flat bed without using bedrails?: None Help needed moving from lying on your back to sitting on the side of a flat bed without using bedrails?: None Help needed moving to and from a bed to a chair (including a wheelchair)?: A Little Help needed standing up from a chair using your arms (e.g., wheelchair or bedside chair)?: A Little Help needed to walk in hospital room?: A Little Help needed climbing 3-5 steps with a railing? : A Little 6 Click Score: 20    End of Session Equipment Utilized During Treatment: Gait belt Activity Tolerance: Patient tolerated treatment well Patient left: in bed;with call bell/phone within reach;with bed alarm set (seated EOB)   PT Visit Diagnosis: Other abnormalities of gait and mobility (R26.89)     Time: 8719-5974 PT Time Calculation (min) (ACUTE ONLY): 27 min  Charges:  $Gait Training: 23-37 mins                     Sheran Lawless, PT Acute Rehabilitation Services [phone] 10/30/2022    Alejandro Perez 10/30/2022, 1:19 PM

## 2023-08-15 DIAGNOSIS — R079 Chest pain, unspecified: Secondary | ICD-10-CM | POA: Diagnosis not present

## 2023-08-28 ENCOUNTER — Emergency Department (HOSPITAL_COMMUNITY): Payer: Medicaid Other

## 2023-08-28 ENCOUNTER — Other Ambulatory Visit: Payer: Self-pay

## 2023-08-28 ENCOUNTER — Emergency Department (HOSPITAL_COMMUNITY)
Admission: EM | Admit: 2023-08-28 | Discharge: 2023-08-28 | Disposition: A | Discharge status: Home / Self Care | Payer: Medicaid Other | Attending: Emergency Medicine | Admitting: Emergency Medicine

## 2023-08-28 ENCOUNTER — Encounter (HOSPITAL_COMMUNITY): Payer: Self-pay | Admitting: Emergency Medicine

## 2023-08-28 DIAGNOSIS — Z79899 Other long term (current) drug therapy: Secondary | ICD-10-CM | POA: Diagnosis not present

## 2023-08-28 DIAGNOSIS — Z7902 Long term (current) use of antithrombotics/antiplatelets: Secondary | ICD-10-CM | POA: Diagnosis not present

## 2023-08-28 DIAGNOSIS — R2990 Unspecified symptoms and signs involving the nervous system: Secondary | ICD-10-CM

## 2023-08-28 DIAGNOSIS — I10 Essential (primary) hypertension: Secondary | ICD-10-CM | POA: Diagnosis not present

## 2023-08-28 DIAGNOSIS — Z8673 Personal history of transient ischemic attack (TIA), and cerebral infarction without residual deficits: Secondary | ICD-10-CM | POA: Diagnosis not present

## 2023-08-28 DIAGNOSIS — R079 Chest pain, unspecified: Secondary | ICD-10-CM

## 2023-08-28 DIAGNOSIS — R519 Headache, unspecified: Secondary | ICD-10-CM | POA: Diagnosis present

## 2023-08-28 LAB — CBC
HCT: 47.5 % (ref 39.0–52.0)
Hemoglobin: 16.6 g/dL (ref 13.0–17.0)
MCH: 28.6 pg (ref 26.0–34.0)
MCHC: 34.9 g/dL (ref 30.0–36.0)
MCV: 81.8 fL (ref 80.0–100.0)
Platelets: 470 10*3/uL — ABNORMAL HIGH (ref 150–400)
RBC: 5.81 MIL/uL (ref 4.22–5.81)
RDW: 14.6 % (ref 11.5–15.5)
WBC: 6.4 10*3/uL (ref 4.0–10.5)
nRBC: 0 % (ref 0.0–0.2)

## 2023-08-28 LAB — DIFFERENTIAL
Abs Immature Granulocytes: 0.01 10*3/uL (ref 0.00–0.07)
Basophils Absolute: 0.1 10*3/uL (ref 0.0–0.1)
Basophils Relative: 2 %
Eosinophils Absolute: 0.6 10*3/uL — ABNORMAL HIGH (ref 0.0–0.5)
Eosinophils Relative: 9 %
Immature Granulocytes: 0 %
Lymphocytes Relative: 32 %
Lymphs Abs: 2.1 10*3/uL (ref 0.7–4.0)
Monocytes Absolute: 1.1 10*3/uL — ABNORMAL HIGH (ref 0.1–1.0)
Monocytes Relative: 17 %
Neutro Abs: 2.6 10*3/uL (ref 1.7–7.7)
Neutrophils Relative %: 40 %

## 2023-08-28 LAB — I-STAT CHEM 8, ED
BUN: 7 mg/dL (ref 6–20)
Calcium, Ion: 1.09 mmol/L — ABNORMAL LOW (ref 1.15–1.40)
Chloride: 104 mmol/L (ref 98–111)
Creatinine, Ser: 1.8 mg/dL — ABNORMAL HIGH (ref 0.61–1.24)
Glucose, Bld: 100 mg/dL — ABNORMAL HIGH (ref 70–99)
HCT: 47 % (ref 39.0–52.0)
Hemoglobin: 16 g/dL (ref 13.0–17.0)
Potassium: 4.3 mmol/L (ref 3.5–5.1)
Sodium: 139 mmol/L (ref 135–145)
TCO2: 23 mmol/L (ref 22–32)

## 2023-08-28 LAB — URINALYSIS, ROUTINE W REFLEX MICROSCOPIC
Bilirubin Urine: NEGATIVE
Glucose, UA: NEGATIVE mg/dL
Hgb urine dipstick: NEGATIVE
Ketones, ur: NEGATIVE mg/dL
Leukocytes,Ua: NEGATIVE
Nitrite: NEGATIVE
Protein, ur: NEGATIVE mg/dL
Specific Gravity, Urine: 1.003 — ABNORMAL LOW (ref 1.005–1.030)
pH: 7 (ref 5.0–8.0)

## 2023-08-28 LAB — COMPREHENSIVE METABOLIC PANEL
ALT: 24 U/L (ref 0–44)
AST: 20 U/L (ref 15–41)
Albumin: 4.7 g/dL (ref 3.5–5.0)
Alkaline Phosphatase: 83 U/L (ref 38–126)
Anion gap: 14 (ref 5–15)
BUN: 7 mg/dL (ref 6–20)
CO2: 20 mmol/L — ABNORMAL LOW (ref 22–32)
Calcium: 9.4 mg/dL (ref 8.9–10.3)
Chloride: 101 mmol/L (ref 98–111)
Creatinine, Ser: 1.76 mg/dL — ABNORMAL HIGH (ref 0.61–1.24)
GFR, Estimated: 47 mL/min — ABNORMAL LOW (ref >60)
Glucose, Bld: 101 mg/dL — ABNORMAL HIGH (ref 70–99)
Potassium: 4.1 mmol/L (ref 3.5–5.1)
Sodium: 135 mmol/L (ref 135–145)
Total Bilirubin: 0.7 mg/dL (ref 0.3–1.2)
Total Protein: 7.8 g/dL (ref 6.5–8.1)

## 2023-08-28 LAB — TROPONIN I (HIGH SENSITIVITY)
Troponin I (High Sensitivity): 12 ng/L (ref <18)
Troponin I (High Sensitivity): 13 ng/L (ref <18)

## 2023-08-28 LAB — RAPID URINE DRUG SCREEN, HOSP PERFORMED
Amphetamines: NOT DETECTED
Barbiturates: NOT DETECTED
Benzodiazepines: NOT DETECTED
Cocaine: NOT DETECTED
Opiates: NOT DETECTED
Tetrahydrocannabinol: NOT DETECTED

## 2023-08-28 LAB — CBG MONITORING, ED: Glucose-Capillary: 100 mg/dL — ABNORMAL HIGH (ref 70–99)

## 2023-08-28 LAB — ETHANOL: Alcohol, Ethyl (B): <10 mg/dL (ref <10)

## 2023-08-28 LAB — PATHOLOGIST SMEAR REVIEW

## 2023-08-28 LAB — APTT: aPTT: 31 s (ref 24–36)

## 2023-08-28 LAB — I-STAT CG4 LACTIC ACID, ED: Lactic Acid, Venous: 1.3 mmol/L (ref 0.5–1.9)

## 2023-08-28 LAB — PROTIME-INR
INR: 1 (ref 0.8–1.2)
Prothrombin Time: 13 s (ref 11.4–15.2)

## 2023-08-28 LAB — BRAIN NATRIURETIC PEPTIDE: B Natriuretic Peptide: 33 pg/mL (ref 0.0–100.0)

## 2023-08-28 MED ORDER — LEVETIRACETAM 500 MG PO TABS: 1000.0000 mg | ORAL_TABLET | Freq: Two times a day (BID) | ORAL | Status: DC

## 2023-08-28 MED ORDER — LACOSAMIDE 50 MG PO TABS: 200.0000 mg | ORAL_TABLET | Freq: Every day | ORAL | Status: DC

## 2023-08-28 MED ORDER — ACETAMINOPHEN 500 MG PO TABS
1000.0000 mg | ORAL_TABLET | Freq: Once | ORAL | Status: AC
  Administered 2023-08-28: 1000 mg via ORAL
  Filled 2023-08-28: qty 2

## 2023-08-28 MED ORDER — LACOSAMIDE 50 MG PO TABS: 150.0000 mg | ORAL_TABLET | Freq: Every day | ORAL | Status: DC

## 2023-08-28 MED ORDER — HYDROMORPHONE HCL 1 MG/ML IJ SOLN
1.0000 mg | Freq: Once | INTRAMUSCULAR | Status: AC
  Administered 2023-08-28: 1 mg via INTRAVENOUS
  Filled 2023-08-28: qty 1

## 2023-08-28 MED ORDER — HYDROMORPHONE HCL 1 MG/ML IJ SOLN
0.5000 mg | Freq: Once | INTRAMUSCULAR | Status: AC
  Administered 2023-08-28: 0.5 mg via INTRAVENOUS
  Filled 2023-08-28: qty 1

## 2023-08-28 NOTE — ED Provider Notes (Signed)
[place] EMERGENCY DEPARTMENT AT [facility] Provider Note   CSN: [id number] Arrival date & time: 08/28/23  0113     History {Add pertinent medical, surgical, social history, OB history to HPI:1} No chief complaint on file.   Alejandro Perez is a 47 y.o. male.  47 year old male with a history of left MCA stroke and resulting seizures presents to the ER today secondary to a code stroke activation.  The story is a bit unclear.  Neurologist spoke with the wife for further collateral.  Soundly the patient had acute onset of facial droop and slurred speech EMS was called.  On their arrival they noted left-sided weakness and aphasia.  Activated code stroke brought here for further evaluation.  On arrival here patient is complaining of headache and chest pain.  Taken emergently to the CT scanner to rule out bleed.        Home Medications Prior to Admission medications   Medication Sig Start Date End Date Taking? Authorizing Provider  amLODipine (NORVASC) 10 MG tablet TAKE 1 TABLET (10 MG TOTAL) BY MOUTH DAILY. Patient taking differently: Take 10 mg by mouth daily. 09/28/20 12/30/22  Elgergawy, Leana Roe, MD  atorvastatin (LIPITOR) 40 MG tablet TAKE 1 TABLET (40 MG TOTAL) BY MOUTH DAILY. 09/28/20 12/30/22  Elgergawy, Leana Roe, MD  carvedilol (COREG) 6.25 MG tablet Take 1 tablet (6.25 mg total) by mouth 2 (two) times daily with a meal. 10/30/22 11/29/22  [name], DO  clopidogrel (PLAVIX) 75 MG tablet Take 1 tablet (75 mg total) by mouth daily. 10/13/20   Almon Hercules, MD  ezetimibe (ZETIA) 10 MG tablet Take 10 mg by mouth daily.    [provider]  folic acid (FOLVITE) 1 MG tablet Take 1 tablet (1 mg total) by mouth daily. 12/25/17   Rhetta Mura, MD  hydrOXYzine (ATARAX) 50 MG tablet Take 50 mg by mouth daily as needed for anxiety.    [provider]  ibuprofen (ADVIL) 200 MG tablet Take 400-600 mg by mouth every 6 (six) hours as needed for moderate pain.     [provider]  Lacosamide 100 MG TABS Take 1 tablet (100 mg total) by mouth 2 (two) times daily. 10/30/22 11/29/22  Latrelle Dodrill, MD  levETIRAcetam (KEPPRA) 1000 MG tablet Take 1 tablet (1,000 mg total) by mouth 2 (two) times daily. 10/30/22   [name], DO  Multiple Vitamin (MULTIVITAMIN WITH MINERALS) TABS tablet Take 1 tablet by mouth daily. 10/19/19   Mikhail, Nita Sells, DO  pantoprazole (PROTONIX) 40 MG tablet TAKE 1 TABLET (40 MG TOTAL) BY MOUTH DAILY. 09/28/20 12/30/22  Elgergawy, Leana Roe, MD  potassium chloride (KLOR-CON) 10 MEQ tablet Take 10 mEq by mouth daily.    [provider]  senna-docusate (SENOKOT-S) 8.6-50 MG tablet Take 2 tablets by mouth at bedtime as needed for mild constipation or moderate constipation. 10/17/19   [name], MD  sucralfate (CARAFATE) 1 g tablet Take 1 tablet (1 g total) by mouth 4 (four) times daily -  with meals and at bedtime. Patient taking differently: Take 1 g by mouth daily as needed (for pain). 10/13/20   Almon Hercules, MD  vitamin B-12 1000 MCG tablet Take 1 tablet (1,000 mcg total) by mouth daily. 12/25/17   Rhetta Mura, MD  metoprolol tartrate (LOPRESSOR) 25 MG tablet Take 0.5 tablets (12.5 mg total) by mouth 2 (two) times daily. 09/28/20 10/15/20  Elgergawy, Leana Roe, MD      Allergies  Morphine and codeine and Fentanyl    Review of Systems   Review of Systems  Physical Exam Updated Vital Signs There were no vitals taken for this visit. Physical Exam Vitals and nursing note reviewed.  Constitutional:      Appearance: He is well-developed.  HENT:     Head: Normocephalic and atraumatic.  Cardiovascular:     Rate and Rhythm: Normal rate.  Pulmonary:     Effort: Pulmonary effort is normal. No respiratory distress.  Abdominal:     General: There is no distension.  Musculoskeletal:        General: Normal range of motion.     Cervical back: Normal range of motion.  Neurological:     Mental  Status: He is alert.     ED Results / Procedures / Treatments   Labs (all labs ordered are listed, but only abnormal results are displayed) Labs Reviewed  CBG MONITORING, ED - Abnormal; Notable for the following components:      Result Value   Glucose-Capillary 100 (*)    All other components within normal limits  ETHANOL  PROTIME-INR  APTT  CBC  DIFFERENTIAL  COMPREHENSIVE METABOLIC PANEL  RAPID URINE DRUG SCREEN, HOSP PERFORMED  URINALYSIS, ROUTINE W REFLEX MICROSCOPIC  I-STAT CHEM 8, ED    EKG None  Radiology No results found.  Procedures Procedures  {Document cardiac monitor, telemetry assessment procedure when appropriate:1}  Medications Ordered in ED Medications - No data to display  ED Course/ Medical Decision Making/ A&P   {   Click here for ABCD2, HEART and other calculatorsREFRESH Note before signing :1}                              Medical Decision Making Amount and/or Complexity of Data Reviewed Labs: ordered. Radiology: ordered.   ***  {Document critical care time when appropriate:1} {Document review of labs and clinical decision tools ie heart score, Chads2Vasc2 etc:1}  {Document your independent review of radiology images, and any outside records:1} {Document your discussion with family members, caretakers, and with consultants:1} {Document social determinants of health affecting pt's care:1} {Document your decision making why or why not admission, treatments were needed:1} Final Clinical Impression(s) / ED Diagnoses Final diagnoses:  None    Rx / DC Orders ED Discharge Orders     None

## 2023-08-28 NOTE — ED Notes (Signed)
Pt to MRI

## 2023-08-28 NOTE — ED Triage Notes (Signed)
Pt presents via [facility] as a code stroke with complaints of Headache and CP. Per EMS, pt endorses L sided weakness and slurred speech. LKW 2130. Neurologist present upon pt arrival, and pt taken to CT1.A&Ox3 at this time. Denies CP or SOB.    CBG-100

## 2023-08-28 NOTE — Code Documentation (Signed)
Responded to Code Stroke called at 0045 for L sided weakness and slurred speech, [id number]. Pt arrived at 0113, CBG-100, NIH-18, CT head negative for acute changes. TNK not given-pt risk outweighs the benefit. Plan stroke workup. Please complete VS/neuro q2h x 12, then q4h.

## 2023-08-28 NOTE — ED Notes (Signed)
Pt back from MRI; reattached to the monitor and provided a urinal.

## 2023-08-28 NOTE — Consult Note (Addendum)
Neurology Consultation Reason for Consult: Reduced verbal output, transient left-sided weakness Requesting Physician: Marily Memos  CC: Leg swelling, chest pain, shortness of breath, headache  History is obtained from: Primarily emergency contact and chart review  HPI: Kenton Fortin is a 47 y.o. 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  Wife reports they were watching TV when at 9:30 PM she noted that his feet look more swollen and his hands look more swollen.  He has been struggling with extremity edema for the past 2 weeks or so but it had improved.  Today it had worsened over the course of just a few hours and was worse than when he had presented to [facility] for the same problem 2 weeks ago.  He additionally was having chest pain and shortness of breath and started to complain of a headache.  No concern for seizure activity although he was generally speaking all last this is not necessarily unusual when he is feeling unwell.  Wife notes that his blood pressure has been labile and quite elevated as high as 212/122 on October 1, 170s over 100s frequently since then but 150s over 90s today.  He does tend to get a headache when his blood pressure is elevated.  She feels like he does sleep a lot, unclear if this has increased recently or not.  EMS evaluation was significantly confounded by patient effort but there was some concern for left greater than right sided weakness and slurred speech for which code stroke was activated  At baseline he does have a walker at home but they typically leave the house with a cane as this is easier to transport.  He does need help with bathing and dressing.  Wife prepares his  pillboxes.  LKW: 9:30 PM Thrombolytic given?: No, risk not felt to outweigh benefit on review of contraindications; patient already at 4-hour mark on arrival IA performed?: No, exam not consistent with LVO Premorbid modified rankin scale:      4 - Moderately severe disability. Unable to attend to own bodily needs without assistance, and unable to walk unassisted.  ROS: Limited by patient participation, obtained from wife as able  Past Medical History:  Diagnosis Date   Anemia    Chest pain    Glaucoma    Hypertension    LVH (left ventricular hypertrophy)    Spleen enlarged    Stroke (HCC)    Thrombocytopenia [facility])    Past Surgical History:  Procedure Laterality Date   BUBBLE STUDY  09/28/2020   Procedure: BUBBLE STUDY;  Surgeon: [name], MD;  Location: [facility] ENDOSCOPY;  Service: Cardiovascular;;   CHOLECYSTECTOMY     SPLENECTOMY, 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   Procedure: SPLENECTOMY;  Surgeon: [name], MD;  Location: [facility] OR;  Service: General;  Laterality: N/A;   TEE WITHOUT CARDIOVERSION N/A 09/28/2020   Procedure: TRANSESOPHAGEAL ECHOCARDIOGRAM (TEE);  Surgeon: [name], MD;  Location: [facility] ENDOSCOPY;  Service: Cardiovascular;  Laterality: N/A;     Family History  Problem Relation Age of Onset   Sickle cell trait Mother      Social History:  reports that he quit smoking about 2 years ago. He has never used smokeless tobacco. He reports that he does not drink alcohol and does not use drugs.   Exam: Current vital  signs: Ht 6\' 2"  (1.88 m)   Wt 102 kg   BMI 28.87 kg/m  Vital signs in last 24 hours: Weight:  [102 kg] 102 kg (10/08 0145)   Physical Exam  Constitutional: Appears chronically ill Psych: Affect intermittently cooperative Eyes: No scleral injection HENT: No oropharyngeal obstruction.  MSK: Edema of the bilateral lower extremities Respiratory: Effort normal, non-labored breathing GI: Soft.  No distension. There is no tenderness.    Neuro: Mental Status: Patient is awake, alert, oriented to person, place, but reports ages 38 and that he does not know what month it is Patient gives limited history.  Significant difficulty with repetition.  No neglect Cranial Nerves: II: Visual Fields are testing limited by baseline glaucoma and legal blindness.  III,IV, VI: EOMI to orienting to examiner's voice bilaterally V: Facial sensation is symmetric to eyelash brush VII: Facial movement is notable for right facial droop VIII: hearing is intact to voice X: Uvula elevates symmetrically XI: Shoulder shrug is symmetric. XII: tongue is midline without atrophy or fasciculations.  Motor: Extremely limited by effort.  Much more use of his extremities when performing casual tasks (using urinal, changing gown) then on formal testing.  Drift of the bilateral upper extremities.  Some drift of the bilateral lower extremities Sensory: Reports reduced sensation in the right leg Plantars: Toes are downgoing bilaterally.  Cerebellar: Finger-to-nose ataxic in the left upper extremity.  Normal on the right.  Does not perform heel-to-shin Gait:  Deferred   NIHSS total 18 Score breakdown: 2 points for not answering questions correctly, 3 points for her legally blind at baseline, 1 point for right facial droop, 2 points for left arm weakness, 2 points for right arm weakness, 2 points for left leg weakness, 2 points for right leg weakness, 1 point for ataxia of the left upper extremity, 1 point for sensory loss in the right leg, 1 point for mild to moderate aphasia, 1 point for mild dysarthria Performed at time of patient arrival to ED    I have reviewed labs in epic and the results pertinent to this consultation are:  Basic Metabolic Panel: Recent Labs  Lab 08/28/23 0126  NA 139  K 4.3  CL 104  GLUCOSE 100*  BUN 7  CREATININE 1.80*    CBC: Recent Labs  Lab 08/28/23 0126 08/28/23 0132  WBC  --  PENDING  HGB 16.0 16.6  HCT  47.0 47.5  MCV  --  81.8  PLT  --  470*    Coagulation Studies: No results for input(s): "LABPROT", "INR" in the last 72 hours.    I have reviewed the images obtained:  Head CT personally reviewed, agree with radiology no acute intracranial process, remote left parietal/subdural stroke 1. No acute intracranial abnormality. 2. ASPECTS is 10. 3. Chronic posterior left cerebral encephalomalacia, with a few scattered remote lacunar infarcts about the left basal ganglia and bilateral thalami, stable.  Impression: Possible recrudescence of prior stroke symptoms in the setting of chest pain and uncontrolled hypertension.  Physical exam is unfortunately extremely challenging due to variable effort and participation from the patient.  However he does seem to have some left upper extremity ataxia on my evaluation and EMS felt like there was some left-sided weakness.  Therefore I do think an MRI is reasonable, however if this is reassuring I would favor his symptoms to be secondary to discomfort  Recommendations: -MRI brain without contrast (ordered), MRA head; full stroke workup only if positive for stroke -Continue home Vimpat  and lacosamide (ordered) -Workup and management of lower extremity edema, chest pain per ED  Addendum: MRI negative, inpatient neurology will sign off, please reach out with questions/concerns  Brooke Dare MD-PhD [facility] [phone] Available 7 AM to 7 PM, outside these hours please contact Neurologist on call listed on AMION    Total critical care time: 40 minutes   Critical care time was exclusive of separately billable procedures and treating other patients.   Critical care was necessary to treat or prevent imminent or life-threatening deterioration, emergent evaluation for consideration of thrombectomy or thrombolytic   Critical care was time spent personally by me on the following activities: development of treatment plan with patient  and/or surrogate as well as nursing, discussions with consultants/primary team, evaluation of patient's response to treatment, examination of patient, obtaining history from patient or surrogate, ordering and performing treatments and interventions, ordering and review of laboratory studies, ordering and review of radiographic studies, and re-evaluation of patient's condition as needed, as documented above.

## 2023-12-13 DIAGNOSIS — R079 Chest pain, unspecified: Secondary | ICD-10-CM | POA: Diagnosis not present

## 2023-12-14 DIAGNOSIS — I351 Nonrheumatic aortic (valve) insufficiency: Secondary | ICD-10-CM | POA: Diagnosis not present
